# Patient Record
Sex: Male | Born: 1985 | Race: White | Hispanic: No | Marital: Single | Smoking: Current every day smoker
Health system: Southern US, Community
[De-identification: ages and names within clinical notes are randomized; demographics above are authoritative.]

## PROBLEM LIST (undated history)

## (undated) DIAGNOSIS — K5792 Diverticulitis of intestine, part unspecified, without perforation or abscess without bleeding: Secondary | ICD-10-CM

## (undated) DIAGNOSIS — I10 Essential (primary) hypertension: Secondary | ICD-10-CM

## (undated) DIAGNOSIS — F1010 Alcohol abuse, uncomplicated: Secondary | ICD-10-CM

## (undated) DIAGNOSIS — F1910 Other psychoactive substance abuse, uncomplicated: Secondary | ICD-10-CM

## (undated) HISTORY — PX: TIBIA FRACTURE SURGERY: SHX806

## (undated) HISTORY — PX: TONSILLECTOMY AND ADENOIDECTOMY: SUR1326

## (undated) HISTORY — PX: LITHOTRIPSY: SUR834

## (undated) HISTORY — PX: CHEST TUBE INSERTION: SHX231

## (undated) HISTORY — PX: ANKLE SURGERY: SHX546

---
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
## 2012-11-26 ENCOUNTER — Emergency Department: Payer: Self-pay | Admitting: Emergency Medicine

## 2012-12-09 ENCOUNTER — Emergency Department: Payer: Self-pay | Admitting: Emergency Medicine

## 2013-05-07 ENCOUNTER — Emergency Department: Payer: Self-pay | Admitting: Emergency Medicine

## 2013-07-18 ENCOUNTER — Emergency Department: Payer: Self-pay | Admitting: Internal Medicine

## 2013-11-10 ENCOUNTER — Emergency Department: Payer: Self-pay | Admitting: Emergency Medicine

## 2013-11-22 ENCOUNTER — Emergency Department: Payer: Self-pay | Admitting: Emergency Medicine

## 2013-11-22 LAB — CBC WITH DIFFERENTIAL/PLATELET
BASOS ABS: 0.1 10*3/uL (ref 0.0–0.1)
BASOS PCT: 0.7 %
Eosinophil #: 0.1 10*3/uL (ref 0.0–0.7)
Eosinophil %: 1.2 %
HCT: 43.7 % (ref 40.0–52.0)
HGB: 14.6 g/dL (ref 13.0–18.0)
LYMPHS ABS: 2.2 10*3/uL (ref 1.0–3.6)
Lymphocyte %: 18.1 %
MCH: 32.1 pg (ref 26.0–34.0)
MCHC: 33.4 g/dL (ref 32.0–36.0)
MCV: 96 fL (ref 80–100)
Monocyte #: 0.8 x10 3/mm (ref 0.2–1.0)
Monocyte %: 6.4 %
NEUTROS ABS: 9.1 10*3/uL — AB (ref 1.4–6.5)
NEUTROS PCT: 73.6 %
PLATELETS: 181 10*3/uL (ref 150–440)
RBC: 4.56 10*6/uL (ref 4.40–5.90)
RDW: 13.4 % (ref 11.5–14.5)
WBC: 12.4 10*3/uL — ABNORMAL HIGH (ref 3.8–10.6)

## 2013-11-22 LAB — COMPREHENSIVE METABOLIC PANEL
ALBUMIN: 3.9 g/dL (ref 3.4–5.0)
ALT: 30 U/L (ref 12–78)
ANION GAP: 5 — AB (ref 7–16)
AST: 24 U/L (ref 15–37)
Alkaline Phosphatase: 81 U/L
BUN: 9 mg/dL (ref 7–18)
Bilirubin,Total: 0.4 mg/dL (ref 0.2–1.0)
CHLORIDE: 105 mmol/L (ref 98–107)
Calcium, Total: 9.2 mg/dL (ref 8.5–10.1)
Co2: 28 mmol/L (ref 21–32)
Creatinine: 0.8 mg/dL (ref 0.60–1.30)
EGFR (African American): >60
Glucose: 105 mg/dL — ABNORMAL HIGH (ref 65–99)
Osmolality: 275 (ref 275–301)
POTASSIUM: 4.1 mmol/L (ref 3.5–5.1)
SODIUM: 138 mmol/L (ref 136–145)
Total Protein: 7.5 g/dL (ref 6.4–8.2)

## 2013-11-22 LAB — URINALYSIS, COMPLETE
BLOOD: NEGATIVE
Bacteria: NONE SEEN
Bilirubin,UR: NEGATIVE
GLUCOSE, UR: NEGATIVE mg/dL (ref 0–75)
KETONE: NEGATIVE
Leukocyte Esterase: NEGATIVE
NITRITE: NEGATIVE
PROTEIN: NEGATIVE
Ph: 7 (ref 4.5–8.0)
RBC,UR: 8 /HPF (ref 0–5)
SQUAMOUS EPITHELIAL: NONE SEEN
Specific Gravity: 1.02 (ref 1.003–1.030)

## 2014-06-09 ENCOUNTER — Emergency Department: Payer: Self-pay | Admitting: Emergency Medicine

## 2014-06-09 LAB — URINALYSIS, COMPLETE
BILIRUBIN, UR: NEGATIVE
Bacteria: NONE SEEN
Glucose,UR: NEGATIVE mg/dL (ref 0–75)
Leukocyte Esterase: NEGATIVE
NITRITE: NEGATIVE
PH: 6 (ref 4.5–8.0)
Protein: 100
RBC,UR: 6506 /HPF (ref 0–5)
SQUAMOUS EPITHELIAL: NONE SEEN
Specific Gravity: 1.033 (ref 1.003–1.030)
WBC UR: 54 /HPF (ref 0–5)

## 2014-06-09 LAB — COMPREHENSIVE METABOLIC PANEL
Albumin: 3.9 g/dL (ref 3.4–5.0)
Alkaline Phosphatase: 86 U/L
Anion Gap: 4 — ABNORMAL LOW (ref 7–16)
BILIRUBIN TOTAL: 0.5 mg/dL (ref 0.2–1.0)
BUN: 15 mg/dL (ref 7–18)
CHLORIDE: 106 mmol/L (ref 98–107)
Calcium, Total: 8.7 mg/dL (ref 8.5–10.1)
Co2: 29 mmol/L (ref 21–32)
Creatinine: 1.02 mg/dL (ref 0.60–1.30)
EGFR (African American): >60
EGFR (Non-African Amer.): >60
Glucose: 104 mg/dL — ABNORMAL HIGH (ref 65–99)
OSMOLALITY: 279 (ref 275–301)
POTASSIUM: 3.8 mmol/L (ref 3.5–5.1)
SGOT(AST): 24 U/L (ref 15–37)
SGPT (ALT): 22 U/L
Sodium: 139 mmol/L (ref 136–145)
TOTAL PROTEIN: 7.6 g/dL (ref 6.4–8.2)

## 2014-06-09 LAB — CBC WITH DIFFERENTIAL/PLATELET
BASOS ABS: 0.1 10*3/uL (ref 0.0–0.1)
BASOS PCT: 0.8 %
Eosinophil #: 0.4 10*3/uL (ref 0.0–0.7)
Eosinophil %: 3.9 %
HCT: 45 % (ref 40.0–52.0)
HGB: 15.1 g/dL (ref 13.0–18.0)
LYMPHS PCT: 25.2 %
Lymphocyte #: 2.6 10*3/uL (ref 1.0–3.6)
MCH: 33.4 pg (ref 26.0–34.0)
MCHC: 33.6 g/dL (ref 32.0–36.0)
MCV: 99 fL (ref 80–100)
Monocyte #: 0.7 x10 3/mm (ref 0.2–1.0)
Monocyte %: 6.8 %
Neutrophil #: 6.6 10*3/uL — ABNORMAL HIGH (ref 1.4–6.5)
Neutrophil %: 63.3 %
PLATELETS: 203 10*3/uL (ref 150–440)
RBC: 4.53 10*6/uL (ref 4.40–5.90)
RDW: 13.3 % (ref 11.5–14.5)
WBC: 10.5 10*3/uL (ref 3.8–10.6)

## 2014-07-03 ENCOUNTER — Emergency Department: Payer: Self-pay | Admitting: Emergency Medicine

## 2014-07-03 LAB — CBC
HCT: 50.7 % (ref 40.0–52.0)
HGB: 16.8 g/dL (ref 13.0–18.0)
MCH: 33.1 pg (ref 26.0–34.0)
MCHC: 33.2 g/dL (ref 32.0–36.0)
MCV: 100 fL (ref 80–100)
PLATELETS: 170 10*3/uL (ref 150–440)
RBC: 5.08 10*6/uL (ref 4.40–5.90)
RDW: 13.5 % (ref 11.5–14.5)
WBC: 12.6 10*3/uL — AB (ref 3.8–10.6)

## 2014-07-03 LAB — COMPREHENSIVE METABOLIC PANEL
ALBUMIN: 4.2 g/dL (ref 3.4–5.0)
Alkaline Phosphatase: 94 U/L
Anion Gap: 7 (ref 7–16)
BUN: 12 mg/dL (ref 7–18)
Bilirubin,Total: 0.5 mg/dL (ref 0.2–1.0)
CALCIUM: 9.4 mg/dL (ref 8.5–10.1)
CREATININE: 1.29 mg/dL (ref 0.60–1.30)
Chloride: 107 mmol/L (ref 98–107)
Co2: 26 mmol/L (ref 21–32)
EGFR (Non-African Amer.): >60
Glucose: 92 mg/dL (ref 65–99)
OSMOLALITY: 279 (ref 275–301)
POTASSIUM: 3.7 mmol/L (ref 3.5–5.1)
SGOT(AST): 27 U/L (ref 15–37)
SGPT (ALT): 20 U/L
Sodium: 140 mmol/L (ref 136–145)
TOTAL PROTEIN: 8.1 g/dL (ref 6.4–8.2)

## 2014-07-04 ENCOUNTER — Ambulatory Visit: Payer: Self-pay | Admitting: Urology

## 2014-07-09 ENCOUNTER — Emergency Department: Payer: Self-pay | Admitting: Emergency Medicine

## 2014-07-09 LAB — COMPREHENSIVE METABOLIC PANEL
ALBUMIN: 3.6 g/dL (ref 3.4–5.0)
AST: 23 U/L (ref 15–37)
Alkaline Phosphatase: 78 U/L
Anion Gap: 9 (ref 7–16)
BUN: 15 mg/dL (ref 7–18)
Bilirubin,Total: 0.2 mg/dL (ref 0.2–1.0)
CALCIUM: 8.8 mg/dL (ref 8.5–10.1)
Chloride: 108 mmol/L — ABNORMAL HIGH (ref 98–107)
Co2: 25 mmol/L (ref 21–32)
Creatinine: 1.16 mg/dL (ref 0.60–1.30)
EGFR (African American): >60
EGFR (Non-African Amer.): >60
GLUCOSE: 109 mg/dL — AB (ref 65–99)
Osmolality: 285 (ref 275–301)
POTASSIUM: 4.1 mmol/L (ref 3.5–5.1)
SGPT (ALT): 21 U/L
Sodium: 142 mmol/L (ref 136–145)
Total Protein: 7.1 g/dL (ref 6.4–8.2)

## 2014-07-09 LAB — URINALYSIS, COMPLETE
BILIRUBIN, UR: NEGATIVE
Bacteria: NONE SEEN
GLUCOSE, UR: NEGATIVE mg/dL (ref 0–75)
Ketone: NEGATIVE
LEUKOCYTE ESTERASE: NEGATIVE
Nitrite: NEGATIVE
PH: 6 (ref 4.5–8.0)
PROTEIN: NEGATIVE
RBC,UR: 53 /HPF (ref 0–5)
Specific Gravity: 1.016 (ref 1.003–1.030)
WBC UR: 1 /HPF (ref 0–5)

## 2014-07-09 LAB — CBC
HCT: 45.9 % (ref 40.0–52.0)
HGB: 15.2 g/dL (ref 13.0–18.0)
MCH: 33 pg (ref 26.0–34.0)
MCHC: 33.1 g/dL (ref 32.0–36.0)
MCV: 100 fL (ref 80–100)
Platelet: 185 10*3/uL (ref 150–440)
RBC: 4.61 10*6/uL (ref 4.40–5.90)
RDW: 13.4 % (ref 11.5–14.5)
WBC: 15.3 10*3/uL — ABNORMAL HIGH (ref 3.8–10.6)

## 2014-07-11 ENCOUNTER — Ambulatory Visit: Payer: Self-pay | Admitting: Urology

## 2014-07-11 LAB — DRUG SCREEN, URINE
AMPHETAMINES, UR SCREEN: NEGATIVE (ref ?–1000)
Barbiturates, Ur Screen: NEGATIVE (ref ?–200)
Benzodiazepine, Ur Scrn: NEGATIVE (ref ?–200)
Cannabinoid 50 Ng, Ur ~~LOC~~: POSITIVE (ref ?–50)
Cocaine Metabolite,Ur ~~LOC~~: NEGATIVE (ref ?–300)
MDMA (Ecstasy)Ur Screen: NEGATIVE (ref ?–500)
METHADONE, UR SCREEN: NEGATIVE (ref ?–300)
OPIATE, UR SCREEN: NEGATIVE (ref ?–300)
PHENCYCLIDINE (PCP) UR S: NEGATIVE (ref ?–25)
Tricyclic, Ur Screen: NEGATIVE (ref ?–1000)

## 2014-07-11 LAB — URINALYSIS, COMPLETE
BACTERIA: NONE SEEN
BLOOD: NEGATIVE
Bilirubin,UR: NEGATIVE
Glucose,UR: NEGATIVE mg/dL (ref 0–75)
Ketone: NEGATIVE
LEUKOCYTE ESTERASE: NEGATIVE
NITRITE: NEGATIVE
PH: 5 (ref 4.5–8.0)
PROTEIN: NEGATIVE
Specific Gravity: 1.021 (ref 1.003–1.030)
Squamous Epithelial: <1

## 2014-11-05 NOTE — Op Note (Signed)
PATIENT NAME:  Christian Dickerson, Christian Dickerson MR#:  [id number] DATE OF BIRTH:  [birth year]  DATE OF PROCEDURE:  07/11/2014  PREOPERATIVE DIAGNOSIS: Left ureterolithiasis.   POSTOPERATIVE DIAGNOSIS: Left ureterolithiasis.   PROCEDURES PERFORMED:  1. Left ureteroscopic ureterolithotomy with holmium laser lithotripsy.  2. Fluoroscopy.   SURGEON: Suszanne ConnersMichael R. Evelene CroonWolff, MD  ANESTHETISLurlean Nanny: Gijsbertus F. Darleene CleaverVan Staveren, MD and  Suszanne ConnersMichael R. Evelene CroonWolff, MD  ANESTHETIC METHOD: General per Dr. Darleene CleaverVan Staveren and local per Dr. Evelene CroonWolff.   INDICATIONS: See the dictated history and physical. After informed consent, the patient requests the above procedure.   OPERATIVE SUMMARY: After adequate general anesthesia had been obtained, the patient was placed into dorsal lithotomy position, and the perineum was prepped and draped in the usual fashion. Fluoroscopy was performed. The patient had a distal left ureteral stone, as well as a left pelvic phlebolith present. At this point, the Stortz mini ureteroscope was coupled with the camera and advanced into the bladder. No bladder lesions were identified. An attempt was made to pass the ureteroscope into the distal ureter, but due to narrowing of the intramural ureter, this was not possible. Therefore, a 0.025 Glidewire was advanced through the ureteroscope and passed up the ureter past the stone into the left renal pelvis. The ureteroscope was removed, taking care to leave the guidewire in position. The intramural ureter was then dilated to 7 mm with a balloon-dilating catheter. After adequate dilatation had been performed, the balloon was deflated and the dilating catheter was removed, taking care to leave the guidewire in position. The mini Storz ureteroscope was then advanced back into the distal ureter and up to the level of the stone. The 272 micron holmium laser fiber was advanced through the scope, and the stone was engaged and fully disintegrated into powder by the laser. At this point, the  ureteroscope was removed. The guidewire was also removed, 10 mL of Xylocaine Viscous was instilled within the urethra and bladder. A B and O suppository was placed. The procedure was then terminated, and the patient was transferred to the recovery room in stable condition.    ____________________________ Suszanne ConnersMichael R. Evelene CroonWolff, MD [email or web address] Dickerson: 07/11/2014 15:16:21 ET T: 07/11/2014 17:26:06 ET JOB#: [id number]  cc: Suszanne ConnersMichael R. Evelene CroonWolff, MD, <Dictator> Orson ApeMICHAEL R Wava Kildow MD ELECTRONICALLY SIGNED 07/12/2014 9:07

## 2014-12-26 ENCOUNTER — Emergency Department
Admission: EM | Admit: 2014-12-26 | Discharge: 2014-12-26 | Disposition: A | Discharge status: Home / Self Care | Payer: Self-pay | Attending: Emergency Medicine | Admitting: Emergency Medicine

## 2014-12-26 ENCOUNTER — Emergency Department: Payer: Self-pay

## 2014-12-26 ENCOUNTER — Encounter: Payer: Self-pay | Admitting: Emergency Medicine

## 2014-12-26 DIAGNOSIS — R109 Unspecified abdominal pain: Secondary | ICD-10-CM

## 2014-12-26 DIAGNOSIS — N200 Calculus of kidney: Secondary | ICD-10-CM | POA: Insufficient documentation

## 2014-12-26 DIAGNOSIS — R319 Hematuria, unspecified: Secondary | ICD-10-CM | POA: Insufficient documentation

## 2014-12-26 DIAGNOSIS — Z720 Tobacco use: Secondary | ICD-10-CM | POA: Insufficient documentation

## 2014-12-26 LAB — COMPREHENSIVE METABOLIC PANEL
ALT: 17 U/L (ref 17–63)
AST: 19 U/L (ref 15–41)
Albumin: 4.2 g/dL (ref 3.5–5.0)
Alkaline Phosphatase: 71 U/L (ref 38–126)
Anion gap: 8 (ref 5–15)
BUN: 14 mg/dL (ref 6–20)
CO2: 25 mmol/L (ref 22–32)
CREATININE: 0.81 mg/dL (ref 0.61–1.24)
Calcium: 9.4 mg/dL (ref 8.9–10.3)
Chloride: 110 mmol/L (ref 101–111)
GFR calc non Af Amer: >60 mL/min (ref >60)
Glucose, Bld: 95 mg/dL (ref 65–99)
Potassium: 3.8 mmol/L (ref 3.5–5.1)
Sodium: 143 mmol/L (ref 135–145)
Total Bilirubin: 0.1 mg/dL — ABNORMAL LOW (ref 0.3–1.2)
Total Protein: 7 g/dL (ref 6.5–8.1)

## 2014-12-26 LAB — CBC
HCT: 43.4 % (ref 40.0–52.0)
HEMOGLOBIN: 14.6 g/dL (ref 13.0–18.0)
MCH: 33.6 pg (ref 26.0–34.0)
MCHC: 33.7 g/dL (ref 32.0–36.0)
MCV: 99.7 fL (ref 80.0–100.0)
Platelets: 150 10*3/uL (ref 150–440)
RBC: 4.35 MIL/uL — ABNORMAL LOW (ref 4.40–5.90)
RDW: 13.4 % (ref 11.5–14.5)
WBC: 9.1 10*3/uL (ref 3.8–10.6)

## 2014-12-26 LAB — URINALYSIS COMPLETE WITH MICROSCOPIC (ARMC ONLY)
BACTERIA UA: NONE SEEN
Bilirubin Urine: NEGATIVE
Glucose, UA: NEGATIVE mg/dL
Ketones, ur: NEGATIVE mg/dL
LEUKOCYTES UA: NEGATIVE
Nitrite: NEGATIVE
Protein, ur: NEGATIVE mg/dL
SPECIFIC GRAVITY, URINE: 1.019 (ref 1.005–1.030)
Squamous Epithelial / LPF: NONE SEEN
pH: 7 (ref 5.0–8.0)

## 2014-12-26 MED ORDER — KETOROLAC TROMETHAMINE 30 MG/ML IJ SOLN
INTRAMUSCULAR | Status: AC
  Filled 2014-12-26: qty 1

## 2014-12-26 MED ORDER — KETOROLAC TROMETHAMINE 30 MG/ML IJ SOLN: 30.0000 mg | Freq: Once | INTRAMUSCULAR | Status: AC

## 2014-12-26 MED ORDER — MORPHINE SULFATE 4 MG/ML IJ SOLN
INTRAMUSCULAR | Status: AC
  Filled 2014-12-26: qty 1

## 2014-12-26 MED ORDER — TAMSULOSIN HCL 0.4 MG PO CAPS: 0.4000 mg | ORAL_CAPSULE | Freq: Every day | ORAL | Status: DC

## 2014-12-26 MED ORDER — ONDANSETRON HCL 4 MG/2ML IJ SOLN
INTRAMUSCULAR | Status: AC
  Filled 2014-12-26: qty 2

## 2014-12-26 MED ORDER — ONDANSETRON 4 MG PO TBDP: 4.0000 mg | ORAL_TABLET | Freq: Three times a day (TID) | ORAL | Status: DC | PRN

## 2014-12-26 MED ORDER — OXYCODONE HCL 5 MG PO TABS: 5.0000 mg | ORAL_TABLET | Freq: Four times a day (QID) | ORAL | Status: DC | PRN

## 2014-12-26 MED ORDER — MORPHINE SULFATE 4 MG/ML IJ SOLN
4.0000 mg | Freq: Once | INTRAMUSCULAR | Status: AC
  Administered 2014-12-26: 4 mg via INTRAVENOUS

## 2014-12-26 MED ORDER — ONDANSETRON HCL 4 MG/2ML IJ SOLN
4.0000 mg | Freq: Once | INTRAMUSCULAR | Status: AC
  Administered 2014-12-26: 4 mg via INTRAVENOUS

## 2014-12-26 NOTE — ED Notes (Signed)
Pt reports scrotal pain that began acutely this morning approx 0500 after urinating, pt admits to having experienced left flank pain intermittently x1-2weeks. Pt w/ hx of kidney stones and reports similar symptoms in the past.

## 2014-12-26 NOTE — ED Notes (Signed)
Patient transported to CT 

## 2014-12-26 NOTE — ED Provider Notes (Signed)
-----------------------------------------   8:17 AM on 12/26/2014 -----------------------------------------  Assuming care from Dr. Manson Passey at 0700.  In short, Christian Dickerson is a 29 y.o. male with a chief complaint of Flank Pain .  Refer to the original H&P for additional details.  The current plan of care is to follow-up labs, urinalysis and CT of the abdomen and pelvis given concern for possible renal stone/ureterolithiasis. Patient currently sleeping. Pain well controlled.  ----------------------------------------- 8:49 AM on 12/26/2014 -----------------------------------------  Patient reports his current pain is currently well controlled. He is awake, resting comfortably in bed. Labs generally unremarkable. CT shows chronic mild to moderate left hydronephrosis without obvious calculus or obstructing lesion today however there is a question of a recently passed stone on the left which is the most likely cause of the patient's symptoms which have now improved. UA with multiple RBCs as expected, no evidence for infected stone. Discussed symptomatic care, follow up with urology. Discussed return precautions and patient is comfortable with discharge plan.   Gayla Doss, MD 12/26/14 [phone]

## 2014-12-26 NOTE — ED Notes (Signed)
Patient ambulatory to triage with steady gait, without difficulty or distress noted; pt reports awoke with testicular pain, hematuria and lower back pain; hx kidney stones

## 2014-12-26 NOTE — ED Provider Notes (Signed)
[facility] Emergency Department Provider Note  ____________________________________________  Time seen: 6:30 AM  I have reviewed the triage vital signs and the nursing notes.   HISTORY  Chief Complaint Flank Pain      HPI Christian Dickerson is a 28 y.o. male presents with 10 out of 10 left flank pain radiating to the testicles, positive hematuria. Patient denies any fever, no dysuria    History reviewed. No pertinent past medical history.  There are no active problems to display for this patient.   Past Surgical History  Procedure Laterality Date  . Lithotripsy    . Tonsillectomy and adenoidectomy      No current outpatient prescriptions on file.  Allergies Review of patient's allergies indicates no known allergies.  No family history on file.  Social History History  Substance Use Topics  . Smoking status: Current Every Day Smoker -- 1.00 packs/day    Types: Cigarettes  . Smokeless tobacco: Not on file  . Alcohol Use: No    Review of Systems  Constitutional: Negative for fever. Eyes: Negative for visual changes. ENT: Negative for sore throat. Cardiovascular: Negative for chest pain. Respiratory: Negative for shortness of breath. Gastrointestinal: Positive for flank pain,  Genitourinary: Negative for dysuria. Musculoskeletal: Negative for back pain. Skin: Negative for rash. Neurological: Negative for headaches, focal weakness or numbness.   10-point ROS otherwise negative.  ____________________________________________   PHYSICAL EXAM:  VITAL SIGNS: ED Triage Vitals  Enc Vitals Group     BP 12/26/14 0617 129/86 mmHg     Pulse Rate 12/26/14 0617 86     Resp 12/26/14 0617 18     Temp 12/26/14 0617 98 F (36.7 C)     Temp Source 12/26/14 0617 Oral     SpO2 12/26/14 0617 100 %     Weight 12/26/14 0617 225 lb (102.059 kg)     Height 12/26/14 0617  (1.854 m)     Head Cir --      Peak Flow --      Pain Score  12/26/14 0627 9     Pain Loc --      Pain Edu? --      Excl. in GC? --     Constitutional: Alert and oriented. Apparent distress Eyes: Conjunctivae are normal. PERRL. Normal extraocular movements. ENT   Head: Normocephalic and atraumatic.   Nose: No congestion/rhinnorhea.   Mouth/Throat: Mucous membranes are moist.   Neck: No stridor. Cardiovascular: Normal rate, regular rhythm. Normal and symmetric distal pulses are present in all extremities. No murmurs, rubs, or gallops. Respiratory: Normal respiratory effort without tachypnea nor retractions. Breath sounds are clear and equal bilaterally. No wheezes/rales/rhonchi. Gastrointestinal: Soft and nontender. No distention. There is no CVA tenderness. Genitourinary: deferred Musculoskeletal: Nontender with normal range of motion in all extremities. No joint effusions.  No lower extremity tenderness nor edema. Neurologic:  Normal speech and language. No gross focal neurologic deficits are appreciated. Speech is normal.  Skin:  Skin is warm, dry and intact. No rash noted. Psychiatric: Mood and affect are normal. Speech and behavior are normal. Patient exhibits appropriate insight and judgment.  ____________________________________________    LABS (pertinent positives/negatives)  Labs Reviewed  CBC - Abnormal; Notable for the following:    RBC 4.35 (*)    All other components within normal limits  COMPREHENSIVE METABOLIC PANEL - Abnormal; Notable for the following:    Total Bilirubin 0.1 (*)    All other components within normal limits  URINALYSIS  COMPLETEWITH MICROSCOPIC ([facility] ONLY) - Abnormal; Notable for the following:    Color, Urine YELLOW (*)    APPearance CLEAR (*)    Hgb urine dipstick 3+ (*)    All other components within normal limits     ____________________________________________   EKG    ____________________________________________    RADIOLOGY  CT scan of the abdomen and pelvis  revealed: IMPRESSION: 1. Mild to moderate left hydronephrosis, similar to that in December, but no urologic calculus or obstructing lesion identified today. Query recently passed stone versus left UPJ scarring. 2. Otherwise negative noncontrast CT Abdomen and Pelvis.  INITIAL IMPRESSION / ASSESSMENT AND PLAN / ED COURSE  Pertinent labs & imaging results that were available during my care of the patient were reviewed by me and considered in my medical decision making (see chart for details).    ____________________________________________   FINAL CLINICAL IMPRESSION(S) / ED DIAGNOSES  Final diagnoses:  Kidney stone      Darci Current, MD 12/28/14 2315

## 2015-04-07 ENCOUNTER — Emergency Department
Admission: EM | Admit: 2015-04-07 | Discharge: 2015-04-07 | Disposition: A | Discharge status: Home / Self Care | Payer: No Typology Code available for payment source | Attending: Emergency Medicine | Admitting: Emergency Medicine

## 2015-04-07 DIAGNOSIS — S299XXA Unspecified injury of thorax, initial encounter: Secondary | ICD-10-CM | POA: Diagnosis not present

## 2015-04-07 DIAGNOSIS — Y9389 Activity, other specified: Secondary | ICD-10-CM | POA: Insufficient documentation

## 2015-04-07 DIAGNOSIS — S8291XA Unspecified fracture of right lower leg, initial encounter for closed fracture: Secondary | ICD-10-CM | POA: Diagnosis not present

## 2015-04-07 DIAGNOSIS — Z79899 Other long term (current) drug therapy: Secondary | ICD-10-CM | POA: Insufficient documentation

## 2015-04-07 DIAGNOSIS — S99911A Unspecified injury of right ankle, initial encounter: Secondary | ICD-10-CM | POA: Insufficient documentation

## 2015-04-07 DIAGNOSIS — Y998 Other external cause status: Secondary | ICD-10-CM | POA: Diagnosis not present

## 2015-04-07 DIAGNOSIS — Y92410 Unspecified street and highway as the place of occurrence of the external cause: Secondary | ICD-10-CM | POA: Insufficient documentation

## 2015-04-07 DIAGNOSIS — Z720 Tobacco use: Secondary | ICD-10-CM | POA: Diagnosis not present

## 2015-04-07 DIAGNOSIS — S8991XA Unspecified injury of right lower leg, initial encounter: Secondary | ICD-10-CM | POA: Diagnosis present

## 2015-04-07 DIAGNOSIS — M79604 Pain in right leg: Secondary | ICD-10-CM

## 2015-04-07 MED ORDER — OXYCODONE HCL 5 MG PO TABS
5.0000 mg | ORAL_TABLET | Freq: Once | ORAL | Status: AC
  Administered 2015-04-07: 5 mg via ORAL
  Filled 2015-04-07: qty 1

## 2015-04-07 MED ORDER — OXYCODONE HCL 5 MG PO CAPS: 5.0000 mg | ORAL_CAPSULE | ORAL | Status: DC | PRN
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
## 2015-04-07 NOTE — Discharge Instructions (Signed)
Contact her orthopedist for any continued pain medication. Keep her appointment for your surgery on Thursday. Keep extremity elevated to prevent swelling. Take pain medication only as directed.
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
## 2015-05-16 ENCOUNTER — Encounter: Payer: Self-pay | Admitting: *Deleted

## 2015-05-16 ENCOUNTER — Emergency Department: Payer: Self-pay

## 2015-05-16 ENCOUNTER — Emergency Department
Admission: EM | Admit: 2015-05-16 | Discharge: 2015-05-17 | Disposition: A | Discharge status: Home / Self Care | Payer: Self-pay | Attending: Emergency Medicine | Admitting: Emergency Medicine

## 2015-05-16 DIAGNOSIS — R52 Pain, unspecified: Secondary | ICD-10-CM

## 2015-05-16 DIAGNOSIS — Z79899 Other long term (current) drug therapy: Secondary | ICD-10-CM | POA: Insufficient documentation

## 2015-05-16 DIAGNOSIS — N50812 Left testicular pain: Secondary | ICD-10-CM | POA: Insufficient documentation

## 2015-05-16 DIAGNOSIS — Z720 Tobacco use: Secondary | ICD-10-CM | POA: Insufficient documentation

## 2015-05-16 DIAGNOSIS — R109 Unspecified abdominal pain: Secondary | ICD-10-CM

## 2015-05-16 LAB — CBC WITH DIFFERENTIAL/PLATELET
Basophils Absolute: 0.2 10*3/uL — ABNORMAL HIGH (ref 0–0.1)
Basophils Relative: 2 %
EOS PCT: 5 %
Eosinophils Absolute: 0.4 10*3/uL (ref 0–0.7)
HCT: 41.6 % (ref 40.0–52.0)
Hemoglobin: 13.8 g/dL (ref 13.0–18.0)
LYMPHS ABS: 2.6 10*3/uL (ref 1.0–3.6)
Lymphocytes Relative: 33 %
MCH: 31.3 pg (ref 26.0–34.0)
MCHC: 33.1 g/dL (ref 32.0–36.0)
MCV: 94.4 fL (ref 80.0–100.0)
MONO ABS: 0.8 10*3/uL (ref 0.2–1.0)
Monocytes Relative: 10 %
Neutro Abs: 4.1 10*3/uL (ref 1.4–6.5)
Neutrophils Relative %: 50 %
PLATELETS: 197 10*3/uL (ref 150–440)
RBC: 4.4 MIL/uL (ref 4.40–5.90)
RDW: 15.6 % — AB (ref 11.5–14.5)
WBC: 8 10*3/uL (ref 3.8–10.6)

## 2015-05-16 LAB — BASIC METABOLIC PANEL
ANION GAP: 5 (ref 5–15)
BUN: 15 mg/dL (ref 6–20)
CALCIUM: 9 mg/dL (ref 8.9–10.3)
CO2: 23 mmol/L (ref 22–32)
Chloride: 110 mmol/L (ref 101–111)
Creatinine, Ser: 0.92 mg/dL (ref 0.61–1.24)
Glucose, Bld: 98 mg/dL (ref 65–99)
Potassium: 3.7 mmol/L (ref 3.5–5.1)
Sodium: 138 mmol/L (ref 135–145)

## 2015-05-16 LAB — URINALYSIS COMPLETE WITH MICROSCOPIC (ARMC ONLY)
Bilirubin Urine: NEGATIVE
GLUCOSE, UA: NEGATIVE mg/dL
Hgb urine dipstick: NEGATIVE
LEUKOCYTES UA: NEGATIVE
NITRITE: NEGATIVE
Protein, ur: NEGATIVE mg/dL
SPECIFIC GRAVITY, URINE: 1.028 (ref 1.005–1.030)
pH: 6 (ref 5.0–8.0)

## 2015-05-16 MED ORDER — MORPHINE SULFATE (PF) 4 MG/ML IV SOLN
6.0000 mg | Freq: Once | INTRAVENOUS | Status: AC
  Administered 2015-05-16: 6 mg via INTRAVENOUS
  Filled 2015-05-16: qty 2

## 2015-05-16 MED ORDER — MORPHINE SULFATE (PF) 4 MG/ML IV SOLN
4.0000 mg | Freq: Once | INTRAVENOUS | Status: AC
  Administered 2015-05-16: 4 mg via INTRAVENOUS
  Filled 2015-05-16: qty 1

## 2015-05-16 MED ORDER — ONDANSETRON HCL 4 MG/2ML IJ SOLN
4.0000 mg | Freq: Once | INTRAMUSCULAR | Status: AC
  Administered 2015-05-16: 4 mg via INTRAVENOUS
  Filled 2015-05-16: qty 2

## 2015-05-16 MED ORDER — DEXTROSE 5 % IV SOLN
1.0000 g | Freq: Once | INTRAVENOUS | Status: AC
  Administered 2015-05-17: 1 g via INTRAVENOUS
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
## 2015-05-16 NOTE — Discharge Instructions (Signed)
Abdominal Pain, Adult Many things can cause abdominal pain. Usually, abdominal pain is not caused by a disease and will improve without treatment. It can often be observed and treated at home. Your health care provider will do a physical exam and possibly order blood tests and X-rays to help determine the seriousness of your pain. However, in many cases, more time must pass before a clear cause of the pain can be found. Before that point, your health care provider may not know if you need more testing or further treatment. HOME CARE INSTRUCTIONS Monitor your abdominal pain for any changes. The following actions may help to alleviate any discomfort you are experiencing:  Only take over-the-counter or prescription medicines as directed by your health care provider.  Do not take laxatives unless directed to do so by your health care provider.  Try a clear liquid diet (broth, tea, or water) as directed by your health care provider. Slowly move to a bland diet as tolerated. SEEK MEDICAL CARE IF:  You have unexplained abdominal pain.  You have abdominal pain associated with nausea or diarrhea.  You have pain when you urinate or have a bowel movement.  You experience abdominal pain that wakes you in the night.  You have abdominal pain that is worsened or improved by eating food.  You have abdominal pain that is worsened with eating fatty foods.  You have a fever. SEEK IMMEDIATE MEDICAL CARE IF:  Your pain does not go away within 2 hours.  You keep throwing up (vomiting).  Your pain is felt only in portions of the abdomen, such as the right side or the left lower portion of the abdomen.  You pass bloody or black tarry stools. MAKE SURE YOU:  Understand these instructions.  Will watch your condition.  Will get help right away if you are not doing well or get worse.   This information is not intended to replace advice given to you by your health care provider. Make sure you discuss  any questions you have with your health care provider.   Document Released: 04/02/2005 Document Revised: 03/14/2015 Document Reviewed: 03/02/2013 Elsevier Interactive Patient Education 2016 [employer]  There does not seem to be any kidney stone on the CAT scan. Also the urine is clear and the blood work looks okay. Just in case I will give you some doxycycline one twice a day for 10 days. Please follow-up with urologist Dr. Thea SilversmithMackenzie. Please also return for fever vomiting worse pain or any other problems.

## 2015-05-16 NOTE — ED Notes (Signed)
Patient transported to Ultrasound 

## 2015-05-16 NOTE — ED Notes (Signed)
Patient transported to CT 
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
## 2015-05-17 MED ORDER — DEXTROSE 5 % IV SOLN
INTRAVENOUS | Status: AC
  Administered 2015-05-17: 1 g via INTRAVENOUS
  Filled 2015-05-17: qty 10

## 2015-05-17 MED ORDER — OXYCODONE-ACETAMINOPHEN 5-325 MG PO TABS
2.0000 | ORAL_TABLET | Freq: Once | ORAL | Status: AC
  Administered 2015-05-17: 2 via ORAL
  Filled 2015-05-17: qty 2

## 2015-05-18 LAB — CHLAMYDIA/NGC RT PCR (ARMC ONLY)
Chlamydia Tr: NOT DETECTED
N GONORRHOEAE: NOT DETECTED

## 2015-10-03 ENCOUNTER — Emergency Department
Admission: EM | Admit: 2015-10-03 | Discharge: 2015-10-03 | Disposition: A | Discharge status: Home / Self Care | Payer: No Typology Code available for payment source | Attending: Emergency Medicine | Admitting: Emergency Medicine

## 2015-10-03 DIAGNOSIS — Y999 Unspecified external cause status: Secondary | ICD-10-CM | POA: Insufficient documentation

## 2015-10-03 DIAGNOSIS — W57XXXA Bitten or stung by nonvenomous insect and other nonvenomous arthropods, initial encounter: Secondary | ICD-10-CM | POA: Insufficient documentation

## 2015-10-03 DIAGNOSIS — Y929 Unspecified place or not applicable: Secondary | ICD-10-CM | POA: Insufficient documentation

## 2015-10-03 DIAGNOSIS — L03116 Cellulitis of left lower limb: Secondary | ICD-10-CM

## 2015-10-03 DIAGNOSIS — Y939 Activity, unspecified: Secondary | ICD-10-CM | POA: Insufficient documentation

## 2015-10-03 DIAGNOSIS — F17210 Nicotine dependence, cigarettes, uncomplicated: Secondary | ICD-10-CM | POA: Insufficient documentation

## 2015-10-03 MED ORDER — DOXYCYCLINE HYCLATE 100 MG PO CAPS: 100.0000 mg | ORAL_CAPSULE | Freq: Two times a day (BID) | ORAL | Status: DC

## 2015-10-03 NOTE — ED Notes (Signed)
Pt presents to ED after a tick bite on Sunday that he states he did not realize until Monday night. Pt states his back hurts, "he feels shitty". Tick bite to L shin, redness note. Pt states drainage from bite, states it is yellow. Pt has cast to R lower leg that he also wants checked out, because the "heel is wet and you can move it".
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
## 2015-10-03 NOTE — Discharge Instructions (Signed)
Please seek medical attention for any high fevers, chest pain, shortness of breath, change in behavior, persistent vomiting, bloody stool or any other new or concerning symptoms. ° ° °Cellulitis °Cellulitis is an infection of the skin and the tissue under the skin. The infected area is usually red and tender. This happens most often in the arms and lower legs. °HOME CARE  °· Take your antibiotic medicine as told. Finish the medicine even if you start to feel better. °· Keep the infected arm or leg raised (elevated). °· Put a warm cloth on the area up to 4 times per day. °· Only take medicines as told by your doctor. °· Keep all doctor visits as told. °GET HELP IF: °· You see red streaks on the skin coming from the infected area. °· Your red area gets bigger or turns a dark color. °· Your bone or joint under the infected area is painful after the skin heals. °· Your infection comes back in the same area or different area. °· You have a puffy (swollen) bump in the infected area. °· You have new symptoms. °· You have a fever. °GET HELP RIGHT AWAY IF:  °· You feel very sleepy. °· You throw up (vomit) or have watery poop (diarrhea). °· You feel sick and have muscle aches and pains. °  °This information is not intended to replace advice given to you by your health care provider. Make sure you discuss any questions you have with your health care provider. °  °Document Released: 12/10/2007 Document Revised: 03/14/2015 Document Reviewed: 09/08/2011 °Elsevier Interactive Patient Education ©2016 [employer] ° °

## 2016-12-09 ENCOUNTER — Emergency Department
Admission: EM | Admit: 2016-12-09 | Discharge: 2016-12-09 | Disposition: A | Discharge status: Home / Self Care | Payer: Self-pay | Attending: Emergency Medicine | Admitting: Emergency Medicine

## 2016-12-09 ENCOUNTER — Encounter: Payer: Self-pay | Admitting: Emergency Medicine

## 2016-12-09 ENCOUNTER — Emergency Department: Payer: Self-pay

## 2016-12-09 DIAGNOSIS — Z79899 Other long term (current) drug therapy: Secondary | ICD-10-CM | POA: Insufficient documentation

## 2016-12-09 DIAGNOSIS — F17210 Nicotine dependence, cigarettes, uncomplicated: Secondary | ICD-10-CM | POA: Insufficient documentation

## 2016-12-09 DIAGNOSIS — Y93E1 Activity, personal bathing and showering: Secondary | ICD-10-CM | POA: Insufficient documentation

## 2016-12-09 DIAGNOSIS — Y929 Unspecified place or not applicable: Secondary | ICD-10-CM | POA: Insufficient documentation

## 2016-12-09 DIAGNOSIS — W010XXA Fall on same level from slipping, tripping and stumbling without subsequent striking against object, initial encounter: Secondary | ICD-10-CM | POA: Insufficient documentation

## 2016-12-09 DIAGNOSIS — S93491A Sprain of other ligament of right ankle, initial encounter: Secondary | ICD-10-CM | POA: Insufficient documentation

## 2016-12-09 DIAGNOSIS — Y999 Unspecified external cause status: Secondary | ICD-10-CM | POA: Insufficient documentation

## 2016-12-09 MED ORDER — MELOXICAM 15 MG PO TABS: 15.0000 mg | ORAL_TABLET | Freq: Every day | ORAL | 2 refills | Status: AC
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
## 2016-12-09 NOTE — ED Triage Notes (Signed)
Pt with right ankle pain after falling today.

## 2016-12-09 NOTE — ED Notes (Signed)
See triage note  States he slipped getting out of shower   Twisted right ankle  Increased pain with ambulation

## 2017-06-04 ENCOUNTER — Encounter: Payer: Self-pay | Admitting: *Deleted

## 2017-06-04 ENCOUNTER — Emergency Department
Admission: EM | Admit: 2017-06-04 | Discharge: 2017-06-04 | Disposition: A | Discharge status: Home / Self Care | Payer: Self-pay | Attending: Emergency Medicine | Admitting: Emergency Medicine

## 2017-06-04 DIAGNOSIS — M62830 Muscle spasm of back: Secondary | ICD-10-CM

## 2017-06-04 DIAGNOSIS — F17210 Nicotine dependence, cigarettes, uncomplicated: Secondary | ICD-10-CM | POA: Insufficient documentation

## 2017-06-04 LAB — URINALYSIS, COMPLETE (UACMP) WITH MICROSCOPIC
BACTERIA UA: NONE SEEN
Bilirubin Urine: NEGATIVE
Glucose, UA: NEGATIVE mg/dL
HGB URINE DIPSTICK: NEGATIVE
Ketones, ur: 5 mg/dL — AB
Leukocytes, UA: NEGATIVE
Nitrite: NEGATIVE
PH: 5 (ref 5.0–8.0)
Protein, ur: 30 mg/dL — AB
SPECIFIC GRAVITY, URINE: 1.035 — AB (ref 1.005–1.030)
Squamous Epithelial / LPF: NONE SEEN

## 2017-06-04 MED ORDER — IBUPROFEN 800 MG PO TABS: 800.0000 mg | ORAL_TABLET | Freq: Three times a day (TID) | ORAL | 0 refills | Status: DC

## 2017-06-04 MED ORDER — METHOCARBAMOL 500 MG PO TABS
1000.0000 mg | ORAL_TABLET | Freq: Once | ORAL | Status: AC
  Administered 2017-06-04: 1000 mg via ORAL
  Filled 2017-06-04: qty 2

## 2017-06-04 MED ORDER — KETOROLAC TROMETHAMINE 30 MG/ML IJ SOLN
30.0000 mg | Freq: Once | INTRAMUSCULAR | Status: AC
  Administered 2017-06-04: 30 mg via INTRAMUSCULAR
  Filled 2017-06-04: qty 1

## 2017-06-04 MED ORDER — CYCLOBENZAPRINE HCL 5 MG PO TABS: 5.0000 mg | ORAL_TABLET | Freq: Three times a day (TID) | ORAL | 0 refills | Status: DC | PRN

## 2017-06-04 NOTE — ED Triage Notes (Signed)
States lower back pain for 2 days, denies any injury, denies any urinary symptoms, states pain is worse with sitting and standing movement, states hx of kidney stones but states "this isnt that type of pain"

## 2017-06-04 NOTE — ED Provider Notes (Signed)
[facility] Emergency Department Provider Note  ___________________________________________   First MD Initiated Contact with Patient 06/04/17 1020     (approximate)  I have reviewed the triage vital signs and the nursing notes.   HISTORY  Chief Complaint Back Pain  HPI Christian Dickerson is a 31 y.o. male complaint back pain.patient states she began having low back pain 2 days ago. He denies any injury. He states he has a history of kidney stones but this pain is different. Pain is worse with sitting and standing.  He denies any radiation of this pain into his lower extremities. Patient continues to ambulate slowly and is guarding his movements. Patient states that he became extremely bad at work and was driven to the emergency department by coworker.he rates his pain as an 8/10.  History reviewed. No pertinent past medical history.  There are no active problems to display for this patient.   Past Surgical History:  Procedure Laterality Date  . ANKLE SURGERY    . LITHOTRIPSY    . TIBIA FRACTURE SURGERY    . TONSILLECTOMY AND ADENOIDECTOMY      Prior to Admission medications   Medication Sig Start Date End Date Taking? Authorizing Provider  cyclobenzaprine (FLEXERIL) 5 MG tablet Take 1 tablet (5 mg total) by mouth 3 (three) times daily as needed for muscle spasms. 06/04/17   Tommi RumpsSummers, Leylanie Woodmansee L, PA-C  ibuprofen (ADVIL,MOTRIN) 800 MG tablet Take 1 tablet (800 mg total) by mouth 3 (three) times daily. 06/04/17   Tommi RumpsSummers, Arraya Buck L, PA-C    Allergies Patient has no known allergies.  History reviewed. No pertinent family history.  Social History Social History   Tobacco Use  . Smoking status: Current Every Day Smoker    Packs/day: 1.00    Types: Cigarettes  . Smokeless tobacco: Never Used  Substance Use Topics  . Alcohol use: Yes  . Drug use: No    Review of Systems Constitutional: No fever/chills Cardiovascular: Denies chest  pain. Respiratory: Denies shortness of breath. Gastrointestinal: No abdominal pain.  No nausea, no vomiting.  Genitourinary: Negative for dysuria. Musculoskeletal: positive for low back pain. Skin: Negative for rash. Neurological: Negative for  focal weakness or numbness. ____________________________________________   PHYSICAL EXAM:  VITAL SIGNS: ED Triage Vitals  Enc Vitals Group     BP 06/04/17 0913 (!) 154/85     Pulse Rate 06/04/17 0913 90     Resp 06/04/17 0913 18     Temp 06/04/17 0913 98 F (36.7 C)     Temp Source 06/04/17 0913 Oral     SpO2 06/04/17 0913 96 %     Weight 06/04/17 0912 246 lb (111.6 kg)     Height 06/04/17 0912 6\' 1"  (1.854 m)     Head Circumference --      Peak Flow --      Pain Score 06/04/17 0912 8     Pain Loc --      Pain Edu? --      Excl. in GC? --    Constitutional: Alert and oriented. Well appearing and in no acute distress. Eyes: Conjunctivae are normal.  Head: Atraumatic. Neck: No stridor.   Cardiovascular: Normal rate, regular rhythm. Grossly normal heart sounds.  Good peripheral circulation. Respiratory: Normal respiratory effort.  No retractions. Lungs CTAB. Gastrointestinal: Soft and nontender. No distention. No CVA tenderness. Musculoskeletal: on examination of the back there is no gross deformity however there is moderate tenderness on palpation of the paraspinous muscles  bilaterally with the right greater than the left. Range of motion is restricted secondary to pain. Straight leg raises were approximately 90 with increased pain in his lower back. Neurologic:  Normal speech and language. No gross focal neurologic deficits are appreciated. Reflexes are 2+ bilaterally. No gait instability. Skin:  Skin is warm, dry and intact. No rash noted. Psychiatric: Mood and affect are normal. Speech and behavior are normal.  ____________________________________________   LABS (all labs ordered are listed, but only abnormal results are  displayed)  Labs Reviewed  URINALYSIS, COMPLETE (UACMP) WITH MICROSCOPIC - Abnormal; Notable for the following components:      Result Value   Color, Urine AMBER (*)    APPearance CLEAR (*)    Specific Gravity, Urine 1.035 (*)    Ketones, ur 5 (*)    Protein, ur 30 (*)    All other components within normal limits    PROCEDURES  Procedure(s) performed: None  Procedures  Critical Care performed: No  ____________________________________________   INITIAL IMPRESSION / ASSESSMENT AND PLAN / ED COURSE Patient was given Toradol 30 mg IM and Robaxin 1000 mg by mouth while in the emergency department. He was reassured that urinalysis did not show any evidence suspicious for kidney stone. Patient states that he is feeling somewhat better. Patient was discharged with prescription for ibuprofen and Robaxin one or 2 tablets every 6 hours as needed for muscle spasms. He is aware that he should not drive or operate machinery while taking the muscle laxity. He is follow[facility]up with [facility] if any continued problems.  ____________________________________________   FINAL CLINICAL IMPRESSION(S) / ED DIAGNOSES  Final diagnoses:  Muscle spasm of back     ED Discharge Orders        Ordered    ibuprofen (ADVIL,MOTRIN) 800 MG tablet  3 times daily     06/04/17 1210    cyclobenzaprine (FLEXERIL) 5 MG tablet  3 times daily PRN     06/04/17 1210       Note:  This document was prepared using Dragon voice recognition software and may include unintentional dictation errors.    Tommi RumpsSummers, Edmundo Tedesco L, PA-C 06/04/17 1320    Minna AntisPaduchowski, Kevin, MD 06/04/17 [phone]

## 2017-06-04 NOTE — ED Notes (Signed)
See triage note  States he developed lower back pain yesterday  Denies any injury  States pain moves from lower back into right hip   States pain was worse this am  Ambulates well to treatment room

## 2017-06-04 NOTE — Discharge Instructions (Signed)
Follow[facility] or call and make an appointment with [facility]. Begin taking medication as directed. Do not take Flexeril while driving or operating machinery as this medication could cause drowsiness increase your  risk for injury.   Moist heat or ice to your back as needed for comfort.

## 2018-08-18 ENCOUNTER — Emergency Department: Payer: Self-pay

## 2018-08-18 ENCOUNTER — Other Ambulatory Visit: Payer: Self-pay

## 2018-08-18 ENCOUNTER — Emergency Department
Admission: EM | Admit: 2018-08-18 | Discharge: 2018-08-18 | Disposition: A | Discharge status: Home / Self Care | Payer: Self-pay | Attending: Student in an Organized Health Care Education/Training Program | Admitting: Student in an Organized Health Care Education/Training Program

## 2018-08-18 ENCOUNTER — Encounter: Payer: Self-pay | Admitting: Emergency Medicine

## 2018-08-18 DIAGNOSIS — Z79899 Other long term (current) drug therapy: Secondary | ICD-10-CM | POA: Insufficient documentation

## 2018-08-18 DIAGNOSIS — J40 Bronchitis, not specified as acute or chronic: Secondary | ICD-10-CM

## 2018-08-18 DIAGNOSIS — R079 Chest pain, unspecified: Secondary | ICD-10-CM

## 2018-08-18 DIAGNOSIS — F17210 Nicotine dependence, cigarettes, uncomplicated: Secondary | ICD-10-CM | POA: Insufficient documentation

## 2018-08-18 LAB — CBC
HCT: 49.4 % (ref 39.0–52.0)
Hemoglobin: 17 g/dL (ref 13.0–17.0)
MCH: 33.5 pg (ref 26.0–34.0)
MCHC: 34.4 g/dL (ref 30.0–36.0)
MCV: 97.2 fL (ref 80.0–100.0)
PLATELETS: 208 10*3/uL (ref 150–400)
RBC: 5.08 MIL/uL (ref 4.22–5.81)
RDW: 13.2 % (ref 11.5–15.5)
WBC: 10.9 10*3/uL — ABNORMAL HIGH (ref 4.0–10.5)
nRBC: 0 % (ref 0.0–0.2)

## 2018-08-18 LAB — BASIC METABOLIC PANEL
ANION GAP: 9 (ref 5–15)
BUN: 16 mg/dL (ref 6–20)
CO2: 24 mmol/L (ref 22–32)
Calcium: 9.6 mg/dL (ref 8.9–10.3)
Chloride: 104 mmol/L (ref 98–111)
Creatinine, Ser: 0.91 mg/dL (ref 0.61–1.24)
GFR calc non Af Amer: >60 mL/min (ref >60)
Glucose, Bld: 108 mg/dL — ABNORMAL HIGH (ref 70–99)
Potassium: 4.5 mmol/L (ref 3.5–5.1)
Sodium: 137 mmol/L (ref 135–145)

## 2018-08-18 LAB — TROPONIN I: Troponin I: <0.03 ng/mL (ref <0.03)

## 2018-08-18 MED ORDER — AZITHROMYCIN 500 MG PO TABS: 500.0000 mg | ORAL_TABLET | Freq: Every day | ORAL | 0 refills | Status: AC

## 2018-08-18 MED ORDER — ALBUTEROL SULFATE HFA 108 (90 BASE) MCG/ACT IN AERS: 2.0000 | INHALATION_SPRAY | Freq: Four times a day (QID) | RESPIRATORY_TRACT | 2 refills | Status: DC | PRN

## 2018-08-18 MED ORDER — SODIUM CHLORIDE 0.9% FLUSH: 3.0000 mL | Freq: Once | INTRAVENOUS | Status: DC

## 2018-08-18 MED ORDER — IOHEXOL 350 MG/ML SOLN
75.0000 mL | Freq: Once | INTRAVENOUS | Status: AC | PRN
  Administered 2018-08-18: 75 mL via INTRAVENOUS

## 2018-08-18 MED ORDER — PREDNISONE 20 MG PO TABS: 40.0000 mg | ORAL_TABLET | Freq: Every day | ORAL | 0 refills | Status: AC

## 2018-08-18 NOTE — ED Provider Notes (Signed)
[facility] Emergency Department Provider Note    First MD Initiated Contact with Patient 08/18/18 1153     (approximate)  I have reviewed the triage vital signs and the nursing notes.   HISTORY  Chief Complaint Chest Pain and Shortness of Breath    HPI Christian Dickerson is a 33 y.o. male presents the ER for evaluation of left-sided chest pain that does hurt when he takes deep breath associated with hemoptysis coughing up pink-tinged phlegm shortness of breath as well.  Denies any history of heart disease personally but does have a history of 1 pack/day smoking as well as alcohol use and history of marijuana use.  Was recently incarcerated.  No travel outside of the country.  States that pain is mild to moderate in severity.    History reviewed. No pertinent past medical history. History reviewed. No pertinent family history. Past Surgical History:  Procedure Laterality Date  . ANKLE SURGERY    . CHEST TUBE INSERTION    . LITHOTRIPSY    . TIBIA FRACTURE SURGERY    . TONSILLECTOMY AND ADENOIDECTOMY     There are no active problems to display for this patient.     Prior to Admission medications   Medication Sig Start Date End Date Taking? Authorizing Provider  albuterol (PROVENTIL HFA;VENTOLIN HFA) 108 (90 Base) MCG/ACT inhaler Inhale 2 puffs into the lungs every 6 (six) hours as needed for wheezing or shortness of breath. 08/18/18   Willy Eddy, MD  azithromycin (ZITHROMAX) 500 MG tablet Take 1 tablet (500 mg total) by mouth daily for 3 days. Take 1 tablet daily for 3 days. 08/18/18 08/21/18  Willy Eddy, MD  cyclobenzaprine (FLEXERIL) 5 MG tablet Take 1 tablet (5 mg total) by mouth 3 (three) times daily as needed for muscle spasms. 06/04/17   Tommi Rumps, PA-C  ibuprofen (ADVIL,MOTRIN) 800 MG tablet Take 1 tablet (800 mg total) by mouth 3 (three) times daily. 06/04/17   Tommi Rumps, PA-C  predniSONE (DELTASONE) 20 MG tablet Take 2  tablets (40 mg total) by mouth daily for 5 days. 08/18/18 08/23/18  Willy Eddy, MD    Allergies Patient has no known allergies.    Social History Social History   Tobacco Use  . Smoking status: Current Every Day Smoker    Packs/day: 1.00    Types: Cigarettes  . Smokeless tobacco: Never Used  Substance Use Topics  . Alcohol use: Yes  . Drug use: No    Review of Systems Patient denies headaches, rhinorrhea, blurry vision, numbness, shortness of breath, chest pain, edema, cough, abdominal pain, nausea, vomiting, diarrhea, dysuria, fevers, rashes or hallucinations unless otherwise stated above in HPI. ____________________________________________   PHYSICAL EXAM:  VITAL SIGNS: Vitals:   08/18/18 1200 08/18/18 1230  BP: (!) 136/95 (!) 141/94  Pulse: 87 88  Resp:    Temp:    SpO2: 97% 98%    Constitutional: Alert and oriented.  Eyes: Conjunctivae are normal.  Head: Atraumatic. Nose: No congestion/rhinnorhea. Mouth/Throat: Mucous membranes are moist.   Neck: No stridor. Painless ROM.  Cardiovascular: Normal rate, regular rhythm. Grossly normal heart sounds.  Good peripheral circulation. Respiratory: Normal respiratory effort.  No retractions. Lungs CTAB. Gastrointestinal: Soft and nontender. No distention. No abdominal bruits. No CVA tenderness. Genitourinary:  Musculoskeletal: No lower extremity tenderness nor edema.  No joint effusions. Neurologic:  Normal speech and language. No gross focal neurologic deficits are appreciated. No facial droop Skin:  Skin is warm, dry  and intact. No rash noted. Psychiatric: Mood and affect are normal. Speech and behavior are normal.  ____________________________________________   LABS (all labs ordered are listed, but only abnormal results are displayed)  Results for orders placed or performed during the hospital encounter of 08/18/18 (from the past 24 hour(s))  Basic metabolic panel     Status: Abnormal   Collection Time:  08/18/18 10:30 AM  Result Value Ref Range   Sodium 137 135 - 145 mmol/L   Potassium 4.5 3.5 - 5.1 mmol/L   Chloride 104 98 - 111 mmol/L   CO2 24 22 - 32 mmol/L   Glucose, Bld 108 (H) 70 - 99 mg/dL   BUN 16 6 - 20 mg/dL   Creatinine, Ser 9.600.91 0.61 - 1.24 mg/dL   Calcium 9.6 8.9 - 45.410.3 mg/dL   GFR calc non Af Amer >60 >60 mL/min   GFR calc Af Amer >60 >60 mL/min   Anion gap 9 5 - 15  CBC     Status: Abnormal   Collection Time: 08/18/18 10:30 AM  Result Value Ref Range   WBC 10.9 (H) 4.0 - 10.5 K/uL   RBC 5.08 4.22 - 5.81 MIL/uL   Hemoglobin 17.0 13.0 - 17.0 g/dL   HCT 09.849.4 11.939.0 - 14.752.0 %   MCV 97.2 80.0 - 100.0 fL   MCH 33.5 26.0 - 34.0 pg   MCHC 34.4 30.0 - 36.0 g/dL   RDW 82.913.2 56.211.5 - 13.015.5 %   Platelets 208 150 - 400 K/uL   nRBC 0.0 0.0 - 0.2 %  Troponin I - ONCE - STAT     Status: None   Collection Time: 08/18/18 10:30 AM  Result Value Ref Range   Troponin I <0.03 <0.03 ng/mL   ____________________________________________  EKG My review and personal interpretation at Time: 10:00   Indication: sob  Rate: 95  Rhythm: sinus Axis: normal Other: non specific st abn, no stemi ____________________________________________  RADIOLOGY  I personally reviewed all radiographic images ordered to evaluate for the above acute complaints and reviewed radiology reports and findings.  These findings were personally discussed with the patient.  Please see medical record for radiology report.  ____________________________________________   PROCEDURES  Procedure(s) performed:  Procedures    Critical Care performed: no ____________________________________________   INITIAL IMPRESSION / ASSESSMENT AND PLAN / ED COURSE  Pertinent labs & imaging results that were available during my care of the patient were reviewed by me and considered in my medical decision making (see chart for details).   DDX: ACS, pericarditis, esophagitis, boerhaaves, pe, dissection, pna, bronchitis,  costochondritis   Christian Dickerson is a 33 y.o. who presents to the ED with symptoms as described above.  Patient arrives in no acute distress but is tachycardic with hemoptysis reported.  Chest x-ray without any evidence of pneumonia.  Blood work otherwise reassuring.  May be having bronchitis but given his description of symptoms without any significant wheeze on exam will order CT imaging the patient is high risk for PE.  Doubt ACS.  Abdominal exam is otherwise soft benign.  Clinical Course as of Aug 18 1328  Wed Aug 18, 2018  1330 CT imaging is reassuring.  No evidence of PE.  Will be treated for bronchitis.  No evidence of ACS.  Stable and appropriate for outpatient follow-up.   [PR]    Clinical Course User Index [PR] Willy Eddyobinson, Trystin Hargrove, MD     As part of my medical decision making, I reviewed the following data  within the electronic MEDICAL RECORD NUMBER Nursing notes reviewed and incorporated, Labs reviewed, notes from prior ED visits and [place] Controlled Substance Database   ____________________________________________   FINAL CLINICAL IMPRESSION(S) / ED DIAGNOSES  Final diagnoses:  Chest pain, unspecified type  Bronchitis      NEW MEDICATIONS STARTED DURING THIS VISIT:  New Prescriptions   ALBUTEROL (PROVENTIL HFA;VENTOLIN HFA) 108 (90 BASE) MCG/ACT INHALER    Inhale 2 puffs into the lungs every 6 (six) hours as needed for wheezing or shortness of breath.   AZITHROMYCIN (ZITHROMAX) 500 MG TABLET    Take 1 tablet (500 mg total) by mouth daily for 3 days. Take 1 tablet daily for 3 days.   PREDNISONE (DELTASONE) 20 MG TABLET    Take 2 tablets (40 mg total) by mouth daily for 5 days.     Note:  This document was prepared using Dragon voice recognition software and may include unintentional dictation errors.    Willy Eddyobinson, Madeleine Fenn, MD 08/18/18 1330

## 2018-08-18 NOTE — ED Triage Notes (Signed)
Pt presents to ED via POV with c/o CP and SOB. Pt c/o cough with pink sputum that tastes like pennies. Pt states pain to L chest at this time, worse with deep inspiration intermittently.

## 2018-08-18 NOTE — ED Notes (Signed)
Patient to Triage 2, EKG completed.

## 2018-08-18 NOTE — ED Notes (Signed)
Patient transported to CT 

## 2019-01-18 ENCOUNTER — Emergency Department
Admission: EM | Admit: 2019-01-18 | Discharge: 2019-01-18 | Disposition: A | Discharge status: Home / Self Care | Payer: Self-pay | Attending: Emergency Medicine | Admitting: Emergency Medicine

## 2019-01-18 ENCOUNTER — Encounter: Payer: Self-pay | Admitting: Emergency Medicine

## 2019-01-18 ENCOUNTER — Other Ambulatory Visit: Payer: Self-pay

## 2019-01-18 DIAGNOSIS — F17210 Nicotine dependence, cigarettes, uncomplicated: Secondary | ICD-10-CM | POA: Insufficient documentation

## 2019-01-18 DIAGNOSIS — Z79899 Other long term (current) drug therapy: Secondary | ICD-10-CM | POA: Insufficient documentation

## 2019-01-18 DIAGNOSIS — M62830 Muscle spasm of back: Secondary | ICD-10-CM | POA: Insufficient documentation

## 2019-01-18 MED ORDER — KETOROLAC TROMETHAMINE 30 MG/ML IJ SOLN
30.0000 mg | Freq: Once | INTRAMUSCULAR | Status: AC
  Administered 2019-01-18: 30 mg via INTRAMUSCULAR
  Filled 2019-01-18: qty 1

## 2019-01-18 MED ORDER — METHOCARBAMOL 500 MG PO TABS: 500.0000 mg | ORAL_TABLET | Freq: Four times a day (QID) | ORAL | 0 refills | Status: DC

## 2019-01-18 MED ORDER — MELOXICAM 15 MG PO TABS: 15.0000 mg | ORAL_TABLET | Freq: Every day | ORAL | 0 refills | Status: DC

## 2019-01-18 MED ORDER — ORPHENADRINE CITRATE 30 MG/ML IJ SOLN
60.0000 mg | Freq: Once | INTRAMUSCULAR | Status: AC
  Administered 2019-01-18: 60 mg via INTRAMUSCULAR
  Filled 2019-01-18: qty 2

## 2019-01-18 NOTE — ED Triage Notes (Signed)
Patient to the ER for c/o lower back pain since 7/5, worse today. Patient states he slept on floor prior to pain starting. Reports having similar pain in the past that went away on its own. Denies any injury.

## 2019-01-18 NOTE — ED Notes (Signed)
See triage note  Presents with lower back pain which is moving into both legs  Ambulates slowly d/t increased pain with movement  States he fell about 8-9 days ago
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
## 2019-04-29 ENCOUNTER — Emergency Department
Admission: EM | Admit: 2019-04-29 | Discharge: 2019-04-29 | Disposition: A | Discharge status: Home / Self Care | Payer: Self-pay | Attending: Emergency Medicine | Admitting: Emergency Medicine

## 2019-04-29 ENCOUNTER — Encounter: Payer: Self-pay | Admitting: Intensive Care

## 2019-04-29 ENCOUNTER — Emergency Department: Payer: Self-pay

## 2019-04-29 ENCOUNTER — Other Ambulatory Visit: Payer: Self-pay

## 2019-04-29 DIAGNOSIS — F17210 Nicotine dependence, cigarettes, uncomplicated: Secondary | ICD-10-CM | POA: Insufficient documentation

## 2019-04-29 DIAGNOSIS — N200 Calculus of kidney: Secondary | ICD-10-CM

## 2019-04-29 DIAGNOSIS — N201 Calculus of ureter: Secondary | ICD-10-CM | POA: Insufficient documentation

## 2019-04-29 DIAGNOSIS — N1330 Unspecified hydronephrosis: Secondary | ICD-10-CM | POA: Insufficient documentation

## 2019-04-29 DIAGNOSIS — Z79899 Other long term (current) drug therapy: Secondary | ICD-10-CM | POA: Insufficient documentation

## 2019-04-29 LAB — CBC
HCT: 46.5 % (ref 39.0–52.0)
Hemoglobin: 16.2 g/dL (ref 13.0–17.0)
MCH: 34.5 pg — ABNORMAL HIGH (ref 26.0–34.0)
MCHC: 34.8 g/dL (ref 30.0–36.0)
MCV: 98.9 fL (ref 80.0–100.0)
Platelets: 198 10*3/uL (ref 150–400)
RBC: 4.7 MIL/uL (ref 4.22–5.81)
RDW: 12.1 % (ref 11.5–15.5)
WBC: 9.5 10*3/uL (ref 4.0–10.5)
nRBC: 0 % (ref 0.0–0.2)

## 2019-04-29 LAB — COMPREHENSIVE METABOLIC PANEL
ALT: 96 U/L — ABNORMAL HIGH (ref 0–44)
AST: 64 U/L — ABNORMAL HIGH (ref 15–41)
Albumin: 4.4 g/dL (ref 3.5–5.0)
Alkaline Phosphatase: 74 U/L (ref 38–126)
Anion gap: 14 (ref 5–15)
BUN: 15 mg/dL (ref 6–20)
CO2: 23 mmol/L (ref 22–32)
Calcium: 9.6 mg/dL (ref 8.9–10.3)
Chloride: 102 mmol/L (ref 98–111)
Creatinine, Ser: 1.03 mg/dL (ref 0.61–1.24)
GFR calc Af Amer: >60 mL/min (ref >60)
GFR calc non Af Amer: >60 mL/min (ref >60)
Glucose, Bld: 119 mg/dL — ABNORMAL HIGH (ref 70–99)
Potassium: 3.9 mmol/L (ref 3.5–5.1)
Sodium: 139 mmol/L (ref 135–145)
Total Bilirubin: 1.1 mg/dL (ref 0.3–1.2)
Total Protein: 7.8 g/dL (ref 6.5–8.1)

## 2019-04-29 LAB — LIPASE, BLOOD: Lipase: 29 U/L (ref 11–51)

## 2019-04-29 MED ORDER — FENTANYL CITRATE (PF) 100 MCG/2ML IJ SOLN
50.0000 ug | INTRAMUSCULAR | Status: DC | PRN
  Administered 2019-04-29: 50 ug via INTRAVENOUS
  Filled 2019-04-29: qty 2

## 2019-04-29 MED ORDER — TAMSULOSIN HCL 0.4 MG PO CAPS: 0.4000 mg | ORAL_CAPSULE | Freq: Every day | ORAL | 0 refills | Status: DC

## 2019-04-29 MED ORDER — ONDANSETRON 4 MG PO TBDP: 4.0000 mg | ORAL_TABLET | Freq: Three times a day (TID) | ORAL | 0 refills | Status: DC | PRN

## 2019-04-29 MED ORDER — ONDANSETRON HCL 4 MG/2ML IJ SOLN
4.0000 mg | Freq: Once | INTRAMUSCULAR | Status: AC
  Administered 2019-04-29: 4 mg via INTRAVENOUS
  Filled 2019-04-29: qty 2

## 2019-04-29 MED ORDER — NAPROXEN 500 MG PO TABS: 500.0000 mg | ORAL_TABLET | Freq: Two times a day (BID) | ORAL | 2 refills | Status: DC

## 2019-04-29 MED ORDER — HYDROCODONE-ACETAMINOPHEN 5-325 MG PO TABS: 1.0000 | ORAL_TABLET | ORAL | 0 refills | Status: DC | PRN

## 2019-04-29 MED ORDER — KETOROLAC TROMETHAMINE 30 MG/ML IJ SOLN
30.0000 mg | Freq: Once | INTRAMUSCULAR | Status: AC
  Administered 2019-04-29: 30 mg via INTRAVENOUS
  Filled 2019-04-29: qty 1

## 2019-04-29 NOTE — ED Notes (Signed)
Pt found pacing around the room, saying it is painful when he lays down.

## 2019-04-29 NOTE — ED Triage Notes (Signed)
Patient c/o right flank pain that started this AM that radiates down to testicles. HX kidney stones

## 2019-04-29 NOTE — ED Provider Notes (Signed)
[facility] Emergency Department Provider Note   ____________________________________________    I have reviewed the triage vital signs and the nursing notes.   HISTORY  Chief Complaint Flank Pain (right)     HPI Christian Dickerson is a 33 y.o. male with a history of kidney stones several years ago who presents with abrupt onset of right flank pain that radiates into his right testicle.  He reports the pain is severe, he woke up with this pain this morning.  He is not take anything for it.  Denies fevers or chills.  No dysuria.  Positive nausea, no vomiting.  No sick contacts,  History reviewed. No pertinent past medical history.  There are no active problems to display for this patient.   Past Surgical History:  Procedure Laterality Date  . ANKLE SURGERY    . CHEST TUBE INSERTION    . LITHOTRIPSY    . TIBIA FRACTURE SURGERY    . TONSILLECTOMY AND ADENOIDECTOMY      Prior to Admission medications   Medication Sig Start Date End Date Taking? Authorizing Provider  albuterol (PROVENTIL HFA;VENTOLIN HFA) 108 (90 Base) MCG/ACT inhaler Inhale 2 puffs into the lungs every 6 (six) hours as needed for wheezing or shortness of breath. 08/18/18   Willy Eddy, MD  HYDROcodone-acetaminophen (NORCO/VICODIN) 5-325 MG tablet Take 1 tablet by mouth every 4 (four) hours as needed for moderate pain. 04/29/19   Jene Every, MD  meloxicam (MOBIC) 15 MG tablet Take 1 tablet (15 mg total) by mouth daily. 01/18/19   Cuthriell, Delorise Royals, PA-C  methocarbamol (ROBAXIN) 500 MG tablet Take 1 tablet (500 mg total) by mouth 4 (four) times daily. 01/18/19   Cuthriell, Delorise Royals, PA-C  naproxen (NAPROSYN) 500 MG tablet Take 1 tablet (500 mg total) by mouth 2 (two) times daily with a meal. 04/29/19   Jene Every, MD  ondansetron (ZOFRAN ODT) 4 MG disintegrating tablet Take 1 tablet (4 mg total) by mouth every 8 (eight) hours as needed. 04/29/19   Jene Every, MD   tamsulosin (FLOMAX) 0.4 MG CAPS capsule Take 1 capsule (0.4 mg total) by mouth daily. 04/29/19   Jene Every, MD     Allergies Patient has no known allergies.  History reviewed. No pertinent family history.  Social History Social History   Tobacco Use  . Smoking status: Current Every Day Smoker    Packs/day: 1.00    Types: Cigarettes  . Smokeless tobacco: Never Used  Substance Use Topics  . Alcohol use: Yes    Alcohol/week: 35.0 standard drinks    Types: 35 Shots of liquor per week    Comment: 1/5 a day  . Drug use: No    Review of Systems  Constitutional: No fever/chills Eyes: No visual changes.  ENT: No sore throat. Cardiovascular: Denies chest pain. Respiratory: Denies shortness of breath. Gastrointestinal: As above Genitourinary: As above Musculoskeletal: Negative for back pain. Skin: Negative for rash. Neurological: Negative for headaches   ____________________________________________   PHYSICAL EXAM:  VITAL SIGNS: ED Triage Vitals  Enc Vitals Group     BP 04/29/19 0943 (!) 142/101     Pulse Rate 04/29/19 0941 87     Resp 04/29/19 0941 18     Temp 04/29/19 0941 97.7 F (36.5 C)     Temp Source 04/29/19 0941 Oral     SpO2 04/29/19 0941 97 %     Weight 04/29/19 0942 110.2 kg (243 lb)     Height 04/29/19  7416 1.854 m (6\' 1" )     Head Circumference --      Peak Flow --      Pain Score 04/29/19 0942 10     Pain Loc --      Pain Edu? --      Excl. in [place]? --     Constitutional: Alert and oriented.   Nose: No congestion/rhinnorhea.  Cardiovascular: Normal rate, regular rhythm. Grossly normal heart sounds.  Good peripheral circulation. Respiratory: Normal respiratory effort.  No retractions. Lungs CTAB. Gastrointestinal: Soft and nontender. No distention.  No CVA tenderness. Genitourinary: deferred Musculoskeletal: No lower extremity tenderness nor edema.  Warm and well perfused Neurologic:  Normal speech and language. No gross focal neurologic  deficits are appreciated.  Skin:  Skin is warm, dry and intact. No rash noted. Psychiatric: Mood and affect are normal. Speech and behavior are normal.  ____________________________________________   LABS (all labs ordered are listed, but only abnormal results are displayed)  Labs Reviewed  COMPREHENSIVE METABOLIC PANEL - Abnormal; Notable for the following components:      Result Value   Glucose, Bld 119 (*)    AST 64 (*)    ALT 96 (*)    All other components within normal limits  CBC - Abnormal; Notable for the following components:   MCH 34.5 (*)    All other components within normal limits  LIPASE, BLOOD  URINALYSIS, COMPLETE (UACMP) WITH MICROSCOPIC   ____________________________________________  EKG   ____________________________________________  RADIOLOGY  CT renal stone study ____________________________________________   PROCEDURES  Procedure(s) performed: No  Procedures   Critical Care performed: No ____________________________________________   INITIAL IMPRESSION / ASSESSMENT AND PLAN / ED COURSE  Pertinent labs & imaging results that were available during my care of the patient were reviewed by me and considered in my medical decision making (see chart for details).  Patient presents with right-sided flank pain of abrupt onset highly suspicious for ureterolithiasis.  Received IV fentanyl with some improvement, we will treat with IV Toradol, obtain CT renal stone study and reevaluate.  CT demonstrates 2 mm distal ureter stone.  Patient had total relief after IV Toradol and is feeling well.  He has not had any dysuria or fever.  He is anxious to go home and I think this is appropriate.  Strict return precautions if any dysuria fever worsening pain.    ____________________________________________   FINAL CLINICAL IMPRESSION(S) / ED DIAGNOSES  Final diagnoses:  Kidney stone        Note:  This document was prepared using Dragon voice  recognition software and may include unintentional dictation errors.   Lavonia Drafts, MD 04/29/19 [phone]

## 2019-04-29 NOTE — ED Notes (Signed)
Pt given urine strainer and educated on catching kidney stone and bringing stone in sterile specimen cup to follow-up appt with primary md for evaluation. Pt explained he has had kidney stones in the past and expressed understanding the process. Pt has no questions or concerns at this time.

## 2019-11-19 ENCOUNTER — Encounter: Payer: Self-pay | Admitting: Emergency Medicine

## 2019-11-19 ENCOUNTER — Other Ambulatory Visit: Payer: Self-pay

## 2019-11-19 ENCOUNTER — Emergency Department
Admission: EM | Admit: 2019-11-19 | Discharge: 2019-11-19 | Disposition: A | Discharge status: Home / Self Care | Payer: Self-pay | Attending: Emergency Medicine | Admitting: Emergency Medicine

## 2019-11-19 DIAGNOSIS — Z79899 Other long term (current) drug therapy: Secondary | ICD-10-CM | POA: Insufficient documentation

## 2019-11-19 DIAGNOSIS — F17210 Nicotine dependence, cigarettes, uncomplicated: Secondary | ICD-10-CM | POA: Insufficient documentation

## 2019-11-19 DIAGNOSIS — M545 Low back pain, unspecified: Secondary | ICD-10-CM

## 2019-11-19 MED ORDER — CYCLOBENZAPRINE HCL 10 MG PO TABS
10.0000 mg | ORAL_TABLET | Freq: Three times a day (TID) | ORAL | 0 refills | Status: DC | PRN
Start: 2019-11-19 — End: 2024-03-13

## 2019-11-19 MED ORDER — OXYCODONE-ACETAMINOPHEN 5-325 MG PO TABS
1.0000 | ORAL_TABLET | Freq: Once | ORAL | Status: AC
  Administered 2019-11-19: 1 via ORAL
  Filled 2019-11-19: qty 1

## 2019-11-19 MED ORDER — CYCLOBENZAPRINE HCL 10 MG PO TABS
10.0000 mg | ORAL_TABLET | Freq: Once | ORAL | Status: AC
  Administered 2019-11-19: 10 mg via ORAL
  Filled 2019-11-19: qty 1

## 2019-11-19 MED ORDER — TRAMADOL HCL 50 MG PO TABS: 50.0000 mg | ORAL_TABLET | Freq: Four times a day (QID) | ORAL | 0 refills | Status: DC | PRN

## 2019-11-19 MED ORDER — KETOROLAC TROMETHAMINE 30 MG/ML IJ SOLN
30.0000 mg | Freq: Once | INTRAMUSCULAR | Status: AC
  Administered 2019-11-19: 30 mg via INTRAMUSCULAR
  Filled 2019-11-19: qty 1

## 2019-11-19 MED ORDER — PREDNISONE 10 MG (21) PO TBPK: ORAL_TABLET | ORAL | 0 refills | Status: DC

## 2019-11-19 MED ORDER — LIDOCAINE 5 % EX PTCH
1.0000 | MEDICATED_PATCH | CUTANEOUS | Status: DC
  Administered 2019-11-19: 1 via TRANSDERMAL
  Filled 2019-11-19: qty 1

## 2019-11-19 NOTE — Discharge Instructions (Addendum)
Follow-up with orthopedics if not improving in 1 week.  Return emergency department worsening.  Take your medications as prescribed.  Apply wet heat followed by ice.  Wet heat should be applied across the lower back to loosen the muscles.  Ice is to be applied directly on the spine to decrease inflammation.

## 2019-11-19 NOTE — ED Provider Notes (Signed)
[facility] Emergency Department Provider Note  ____________________________________________   First MD Initiated Contact with Patient 11/19/19 1222     (approximate)  I have reviewed the triage vital signs and the nursing notes.   HISTORY  Chief Complaint Back Pain    HPI Christian Dickerson is a 34 y.o. male presents to the ED complaining of low back pain that started yesterday but today worsened.  States that he has a history of the same.  States his muscles will lock up frequently.  States he is unable to go from sitting to standing.  States feels like it gets gripping in his lower back.  He denies any numbness or tingling.  No abdominal pain.  No hematuria.  No fever or chills.  Pain while lying still is 5/10.  Pain with movement is 12/10.    History reviewed. No pertinent past medical history.  There are no problems to display for this patient.   Past Surgical History:  Procedure Laterality Date  . ANKLE SURGERY    . CHEST TUBE INSERTION    . LITHOTRIPSY    . TIBIA FRACTURE SURGERY    . TONSILLECTOMY AND ADENOIDECTOMY      Prior to Admission medications   Medication Sig Start Date End Date Taking? Authorizing Provider  albuterol (PROVENTIL HFA;VENTOLIN HFA) 108 (90 Base) MCG/ACT inhaler Inhale 2 puffs into the lungs every 6 (six) hours as needed for wheezing or shortness of breath. 08/18/18   Merlyn Lot, MD  cyclobenzaprine (FLEXERIL) 10 MG tablet Take 1 tablet (10 mg total) by mouth 3 (three) times daily as needed. 11/19/19   Teresia Myint, Linden Dolin, PA-C  HYDROcodone-acetaminophen (NORCO/VICODIN) 5-325 MG tablet Take 1 tablet by mouth every 4 (four) hours as needed for moderate pain. 04/29/19   Lavonia Drafts, MD  meloxicam (MOBIC) 15 MG tablet Take 1 tablet (15 mg total) by mouth daily. 01/18/19   Cuthriell, Charline Bills, PA-C  methocarbamol (ROBAXIN) 500 MG tablet Take 1 tablet (500 mg total) by mouth 4 (four) times daily. 01/18/19   Cuthriell,  Charline Bills, PA-C  naproxen (NAPROSYN) 500 MG tablet Take 1 tablet (500 mg total) by mouth 2 (two) times daily with a meal. 04/29/19   Lavonia Drafts, MD  ondansetron (ZOFRAN ODT) 4 MG disintegrating tablet Take 1 tablet (4 mg total) by mouth every 8 (eight) hours as needed. 04/29/19   Lavonia Drafts, MD  predniSONE (STERAPRED UNI-PAK 21 TAB) 10 MG (21) TBPK tablet Take 6 pills on day one then decrease by 1 pill each day 11/19/19   Versie Starks, PA-C  tamsulosin (FLOMAX) 0.4 MG CAPS capsule Take 1 capsule (0.4 mg total) by mouth daily. 04/29/19   Lavonia Drafts, MD  traMADol (ULTRAM) 50 MG tablet Take 1 tablet (50 mg total) by mouth every 6 (six) hours as needed. 11/19/19   Versie Starks, PA-C    Allergies Patient has no known allergies.  History reviewed. No pertinent family history.  Social History Social History   Tobacco Use  . Smoking status: Current Every Day Smoker    Packs/day: 1.00    Types: Cigarettes  . Smokeless tobacco: Never Used  Substance Use Topics  . Alcohol use: Yes    Alcohol/week: 35.0 standard drinks    Types: 35 Shots of liquor per week    Comment: 1/5 a day  . Drug use: No    Review of Systems  Constitutional: No fever/chills Eyes: No visual changes. ENT: No sore throat. Respiratory:  Denies cough Cardiovascular: Denies chest pain Gastrointestinal: Denies abdominal pain Genitourinary: Negative for dysuria. Musculoskeletal: Positive for back pain. Skin: Negative for rash. Psychiatric: no mood changes,     ____________________________________________   PHYSICAL EXAM:  VITAL SIGNS: ED Triage Vitals  Enc Vitals Group     BP 11/19/19 1141 (!) 150/100     Pulse Rate 11/19/19 1141 99     Resp 11/19/19 1141 (!) 22     Temp 11/19/19 1141 98 F (36.7 C)     Temp Source 11/19/19 1141 Oral     SpO2 11/19/19 1141 95 %     Weight 11/19/19 1140 238 lb (108 kg)     Height 11/19/19 1140 6\' 1"  (1.854 m)     Head Circumference --      Peak Flow --        Pain Score 11/19/19 1140 7     Pain Loc --      Pain Edu? --      Excl. in GC? --     Constitutional: Alert and oriented. Well appearing and in no acute distress. Eyes: Conjunctivae are normal.  Head: Atraumatic. Nose: No congestion/rhinnorhea. Mouth/Throat: Mucous membranes are moist.   Neck:  supple no lymphadenopathy noted Cardiovascular: Normal rate, regular rhythm. Respiratory: Normal respiratory effort.  No retractions, Abd: soft nontender bs normal all 4 quad GU: deferred Musculoskeletal: FROM all extremities, warm and well perfused, spine is nontender, lumbar muscles are spasmed and tender bilaterally, patient has difficulty going from lying to sitting.  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 Neurologic:  Normal speech and language.  Skin:  Skin is warm, dry and intact. No rash noted. Psychiatric: Mood and affect are normal. Speech and behavior are normal.  ____________________________________________   LABS (all labs ordered are listed, but only abnormal results are displayed)  Labs Reviewed - No data to display ____________________________________________   ____________________________________________  RADIOLOGY    ____________________________________________   PROCEDURES  Procedure(s) performed: Lidoderm patch, Flexeril 10 mg p.o., Percocet 1 p.o., Toradol 30 mg IM   Procedures    ____________________________________________   INITIAL IMPRESSION / ASSESSMENT AND PLAN / ED COURSE  Pertinent labs & imaging results that were available during my care of the patient were reviewed by me and considered in my medical decision making (see chart for details).   Patient is 34 year old male presents emergency department with complaints of low back pain.  See HPI  Physical exam shows patient to appear uncomfortable.  He has difficulty going from lying to sitting.  Lumbar muscles are tender to palpation but  there is no spinal tenderness.  Full strength in the lower legs.  Patient is able to wiggle his toes on the area that is not amputated.  Neurovascular appears to be intact.  I explained the findings to the patient.  He was given Lidoderm patch, Toradol 30 mg IM, Flexeril 10 mg p.o., and Percocet 1 p.o. while here in the ED.  He is to apply wet heat followed by ice.  Follow-up with orthopedics.  Return to emergency department if worsening.  He was given a prescription for Sterapred, Flexeril, and tramadol.    Christian Dickerson was evaluated in Emergency Department on 11/19/2019 for the symptoms described in the history of present illness. He was evaluated in the context of the global COVID-19 pandemic, which necessitated consideration that the patient might be at risk for infection with the SARS-CoV-2 virus that causes COVID-19. Institutional protocols  and algorithms that pertain to the evaluation of patients at risk for COVID-19 are in a state of rapid change based on information released by regulatory bodies including the CDC and federal and state organizations. These policies and algorithms were followed during the patient's care in the ED.   As part of my medical decision making, I reviewed the following data within the electronic MEDICAL RECORD NUMBER Nursing notes reviewed and incorporated, Old chart reviewed, Radiograph reviewed , Notes from prior ED visits and [place] Controlled Substance Database  ____________________________________________   FINAL CLINICAL IMPRESSION(S) / ED DIAGNOSES  Final diagnoses:  Acute midline low back pain without sciatica      NEW MEDICATIONS STARTED DURING THIS VISIT:  New Prescriptions   CYCLOBENZAPRINE (FLEXERIL) 10 MG TABLET    Take 1 tablet (10 mg total) by mouth 3 (three) times daily as needed.   PREDNISONE (STERAPRED UNI-PAK 21 TAB) 10 MG (21) TBPK TABLET    Take 6 pills on day one then decrease by 1 pill each day   TRAMADOL (ULTRAM) 50 MG TABLET    Take 1  tablet (50 mg total) by mouth every 6 (six) hours as needed.     Note:  This document was prepared using Dragon voice recognition software and may include unintentional dictation errors.    Faythe Ghee, PA-C 11/19/19 1308    Jene Every, MD 11/19/19 [phone]

## 2019-11-19 NOTE — ED Triage Notes (Signed)
Pt presents to ED via POV, ambulatory to triage at this time. Pt c/o "really bad back pain since Tuesday". Pt visualized in NAD at this time. Pt states pain worse with movement, pt states pain in his lower back.

## 2019-11-19 NOTE — ED Notes (Signed)
Patient reports woke up yesterday with back pain today unable to get in comfortable position. Denies trauma. Patient reports heavy lifting at work.

## 2020-12-23 IMAGING — CR DG CHEST 2V
1 series · 3 of 3 positions shown · non-contrast
Comparison: None.

Addendum:
CLINICAL DATA: Chest pain and cough

EXAM:
CHEST - 2 VIEW

[Series 1: dg chest 2 view · 0.14mm/px · 3 of 3 slices shown]
[im 1/3]
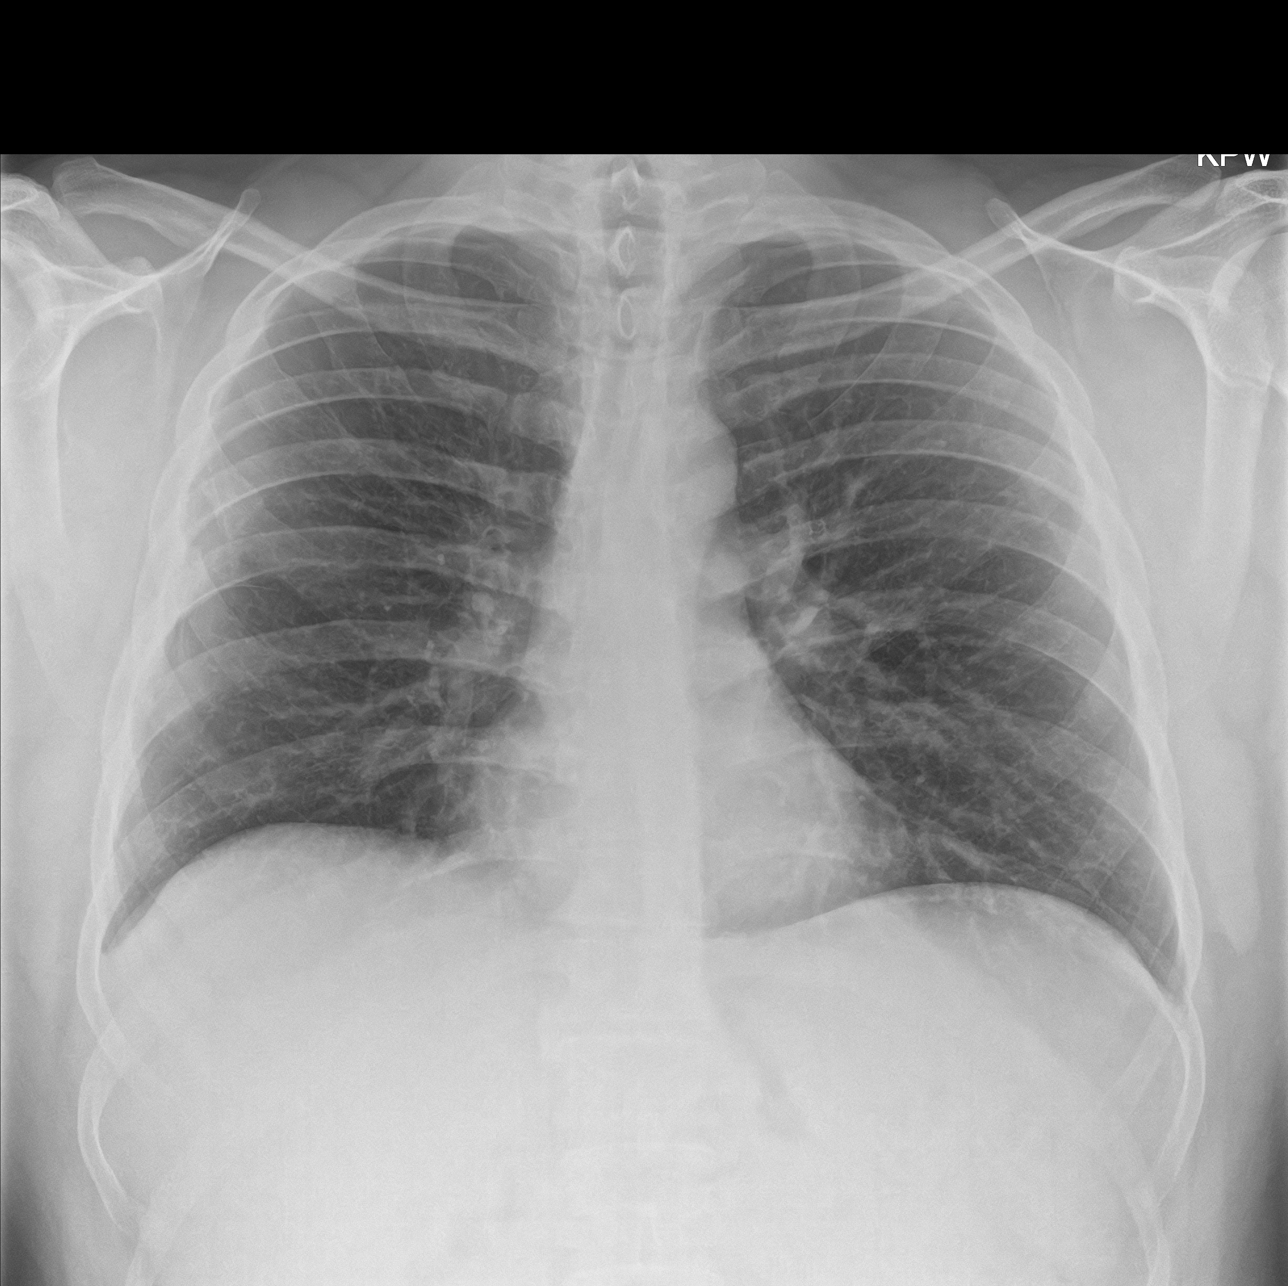
[im 2/3]
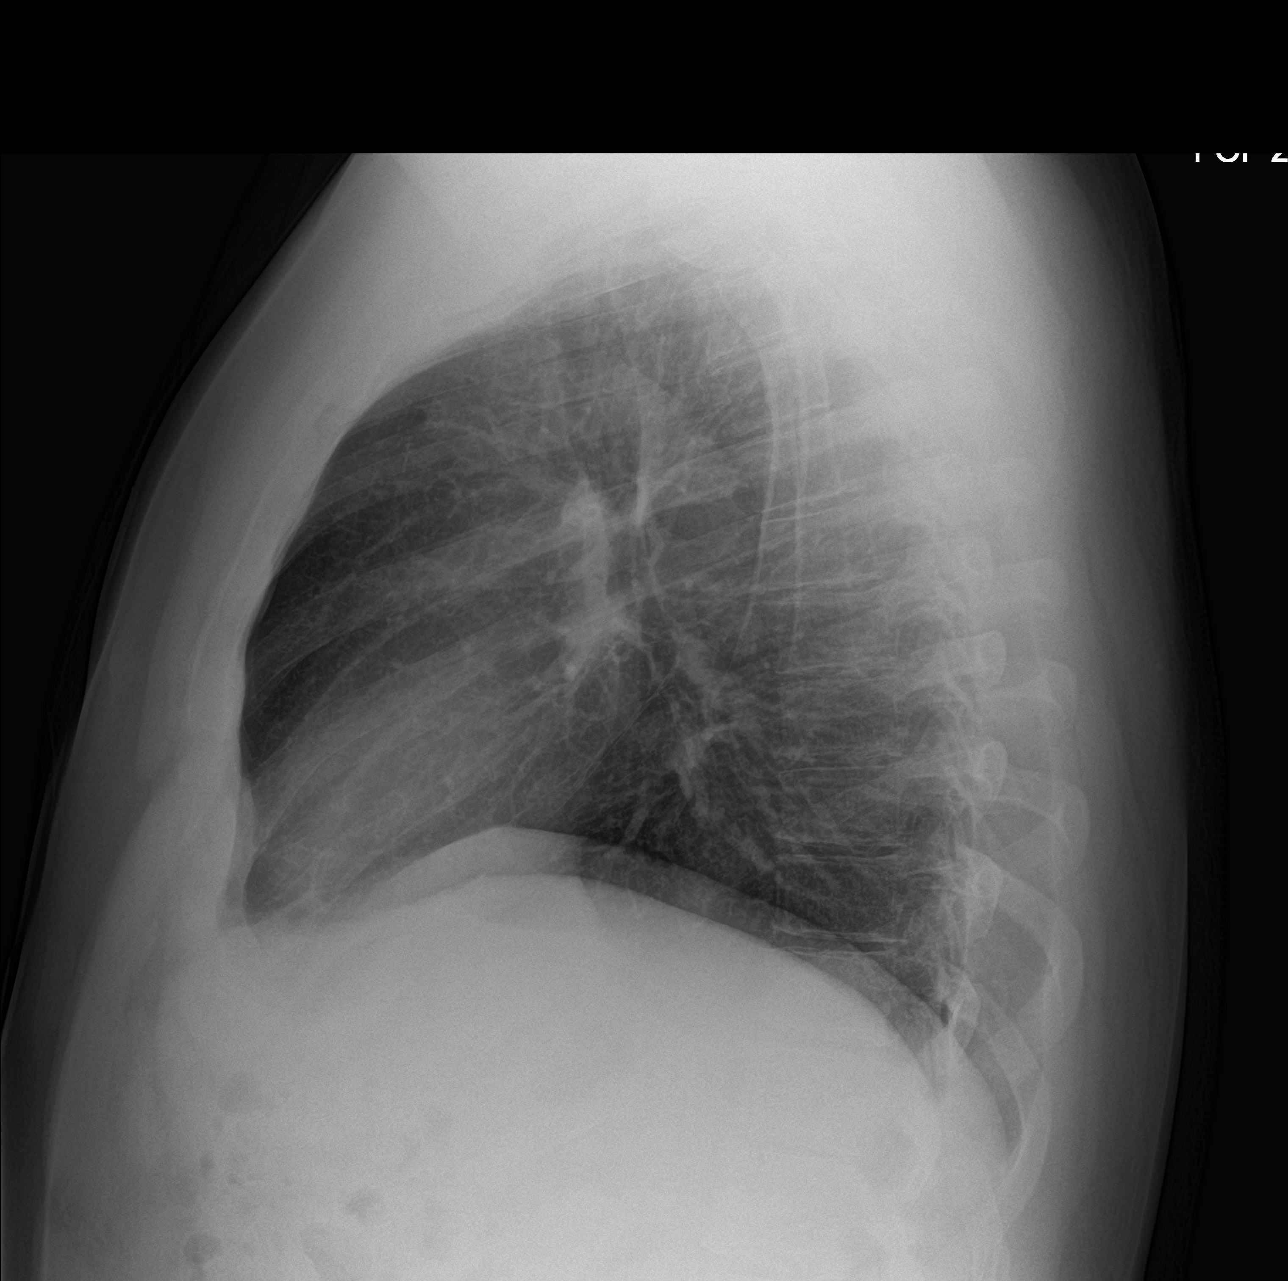
[im 3/3]
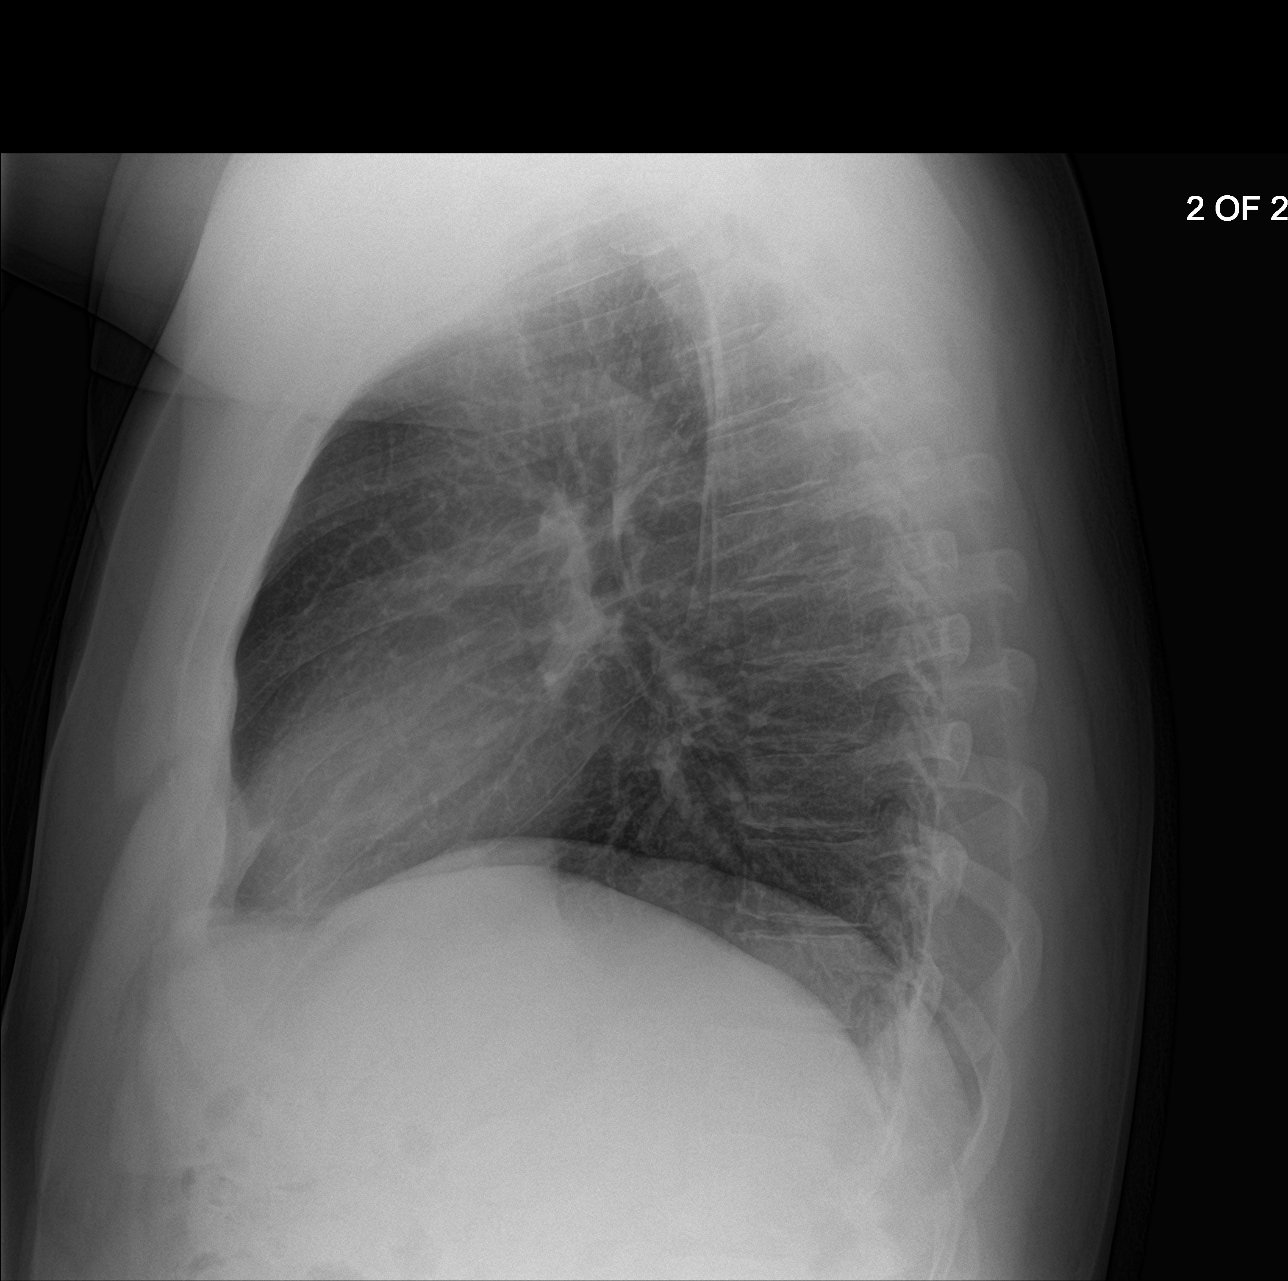

[3 of 3 positions shown; findings below may reference images not displayed]

FINDINGS: There is no edema or consolidation. Heart size and pulmonary
vascularity are normal. No adenopathy. No the thorax. There old
healed rib fractures on the right with remodeling.
IMPRESSION: No edema or consolidation.

ADDENDUM:
Sentence should read: There are old healed rib fractures on the
right with remodeling.

*** End of Addendum ***

## 2021-09-03 IMAGING — CT CT RENAL STONE PROTOCOL
2 of 4 series · 16 of 46 positions shown, 18 images · non-contrast
Comparison: CT angiography of the chest from 08/18/2018 and CT
stone study of 05/17/2015

CLINICAL DATA: Flank pain, stone disease suspected, history of
right flank pain and history of kidney stones.

EXAM:
CT ABDOMEN AND PELVIS WITHOUT CONTRAST
TECHNIQUE: Multidetector CT imaging of the abdomen and pelvis was performed
following the standard protocol without IV contrast.

[Series 2: stone full standard · axial · 0.81mm/px · z∈[-628,-118]mm · 13 of 112 slices shown, 15 images]
[im 5/112  soft-tissue]
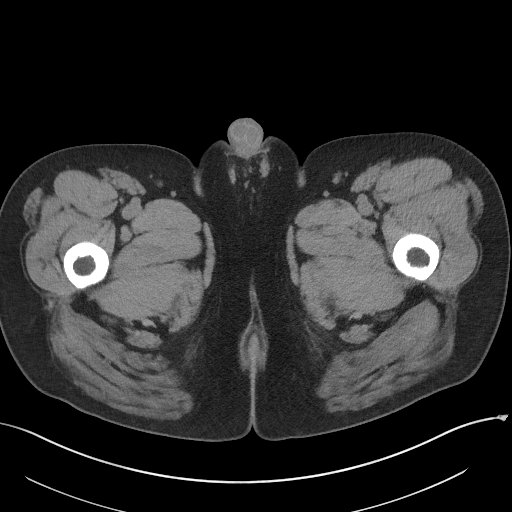
[im 5/112  bone]
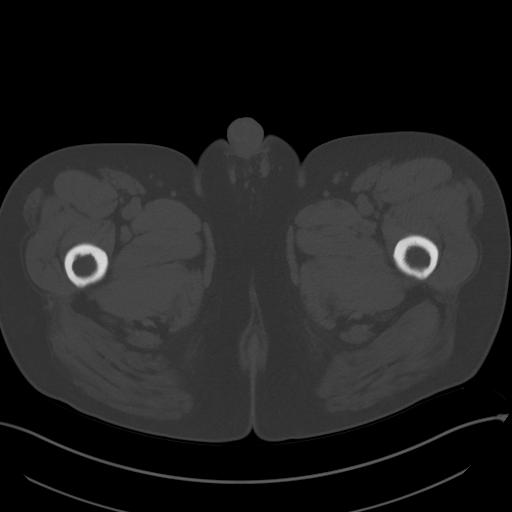
[im 14/112  soft-tissue]
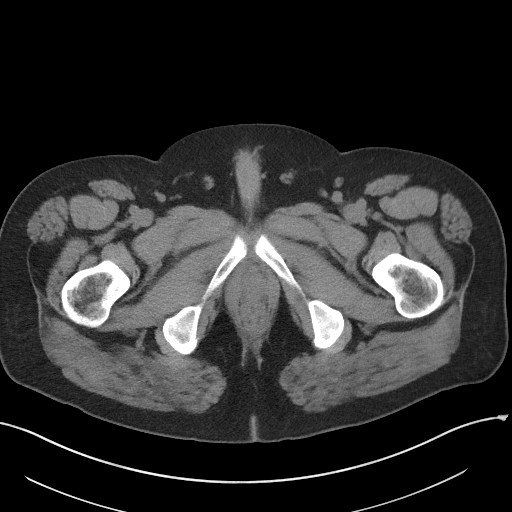
[im 23/112  soft-tissue]
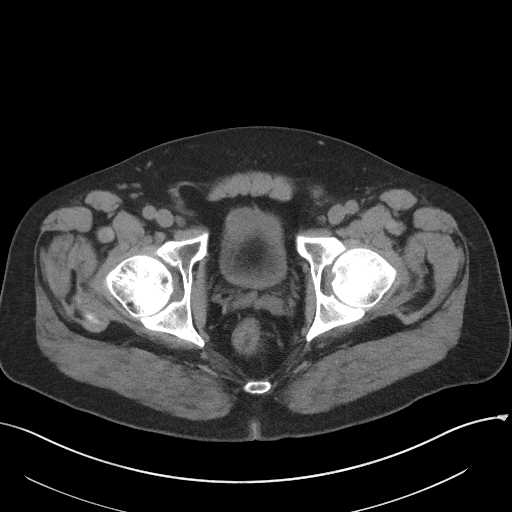
[im 32/112  soft-tissue]
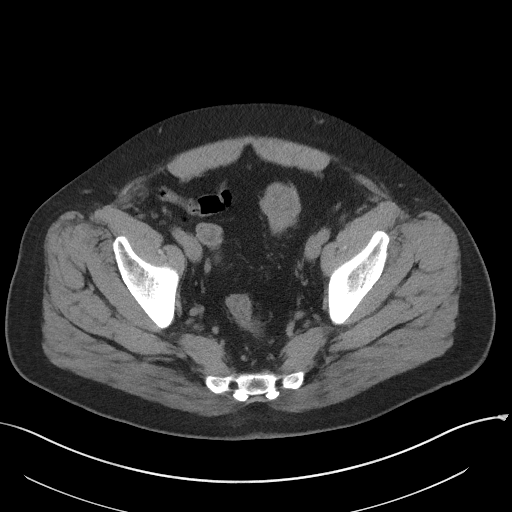
[im 40/112  soft-tissue]
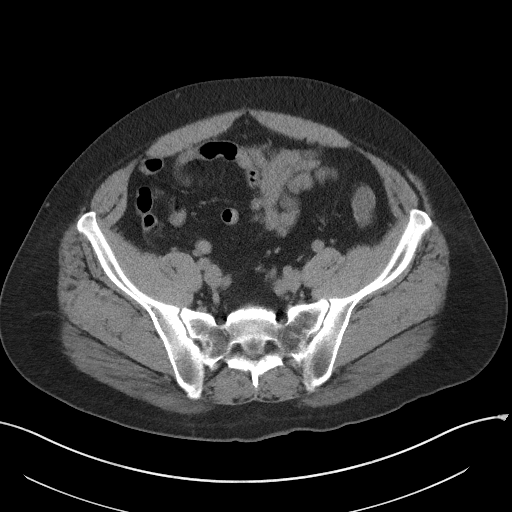
[im 49/112  soft-tissue]
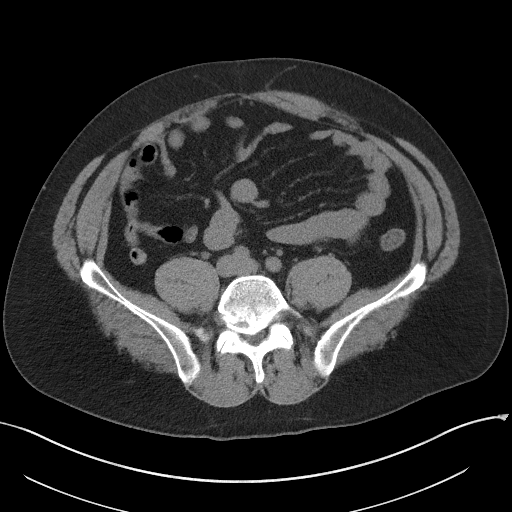
[im 58/112  soft-tissue]
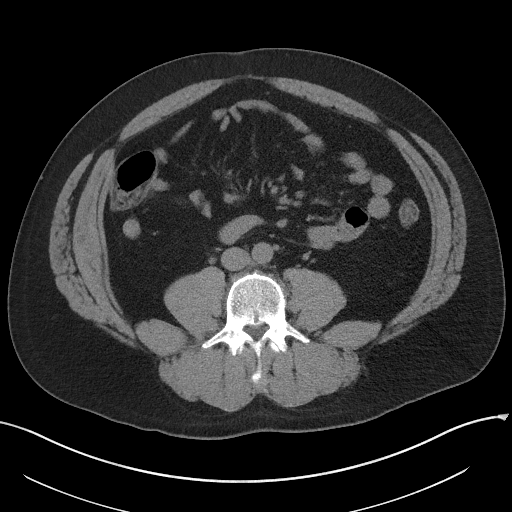
[im 63/112  soft-tissue]
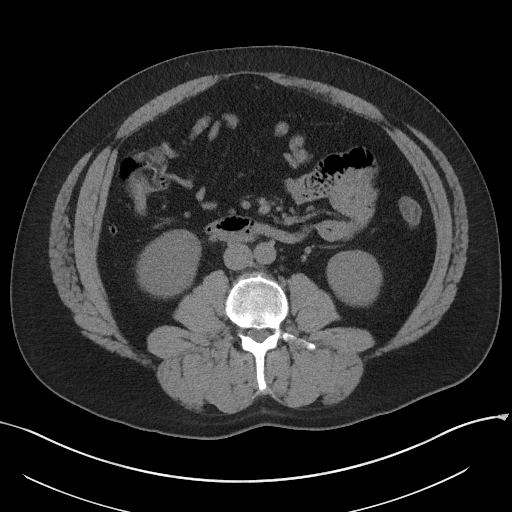
[im 72/112  soft-tissue]
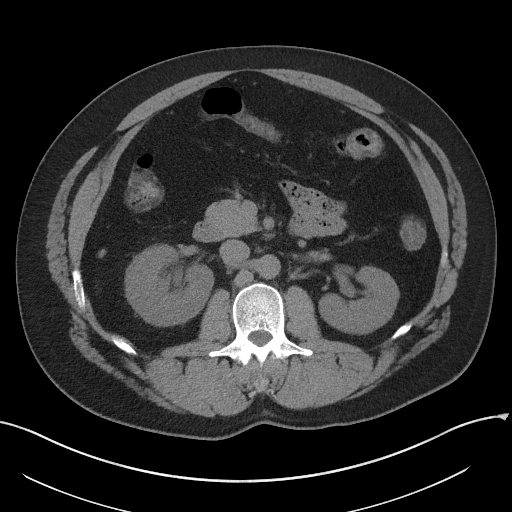
[im 72/112  bone]
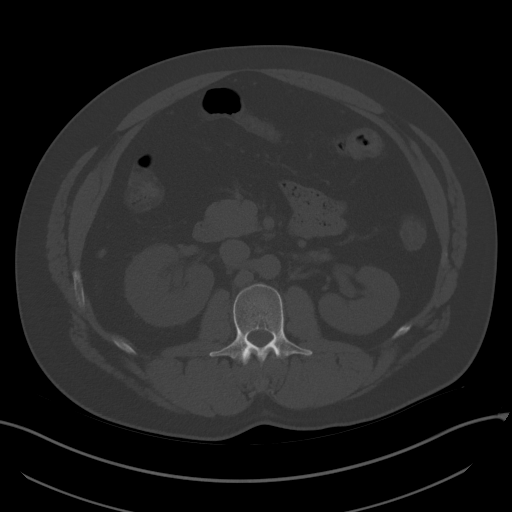
[im 80/112  soft-tissue]
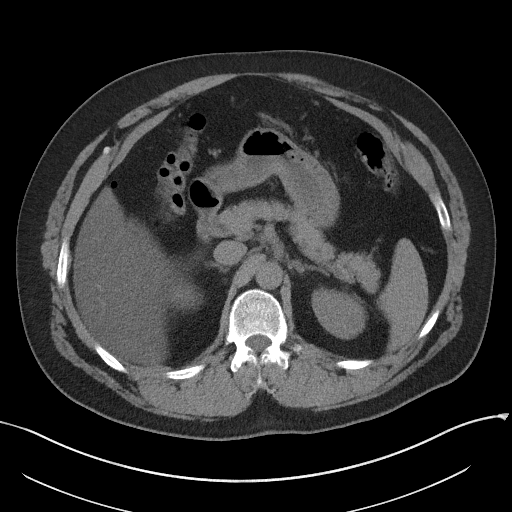
[im 89/112  soft-tissue]
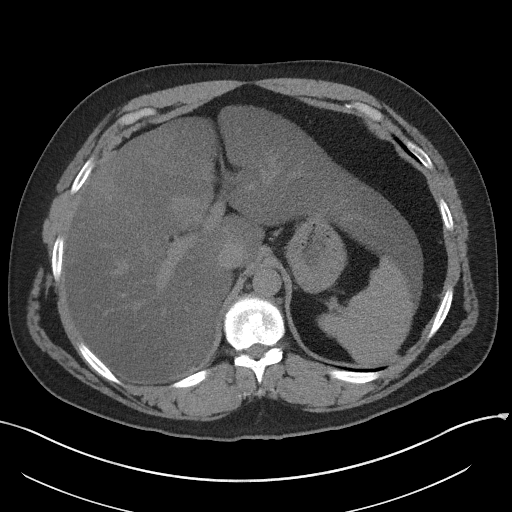
[im 98/112  soft-tissue]
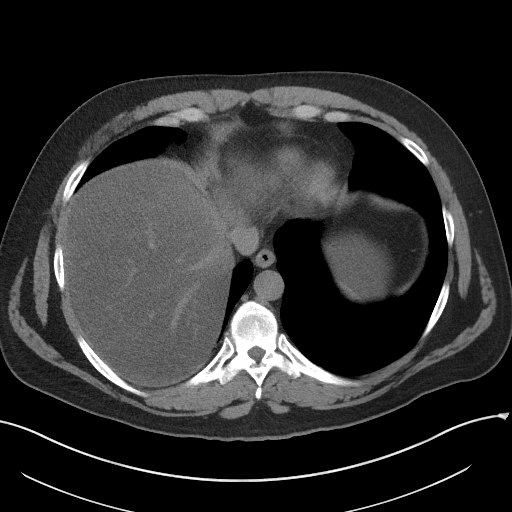
[im 107/112  soft-tissue]
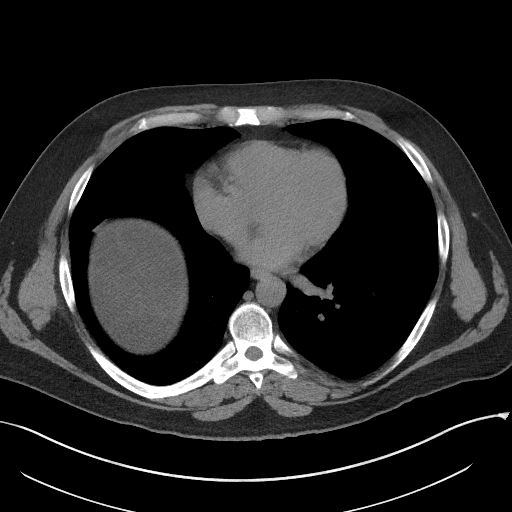

[Series 5: coronal · coronal · 0.80mm/px · 3 of 157 slices shown]
[im 53/157  soft-tissue]
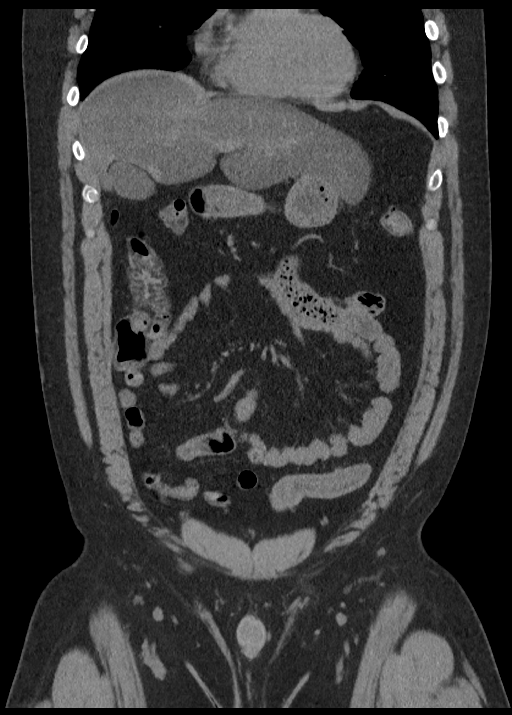
[im 70/157  soft-tissue]
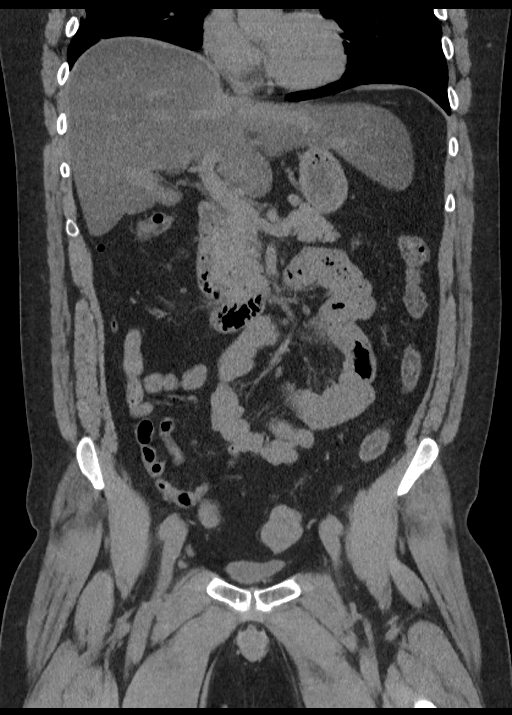
[im 87/157  soft-tissue]
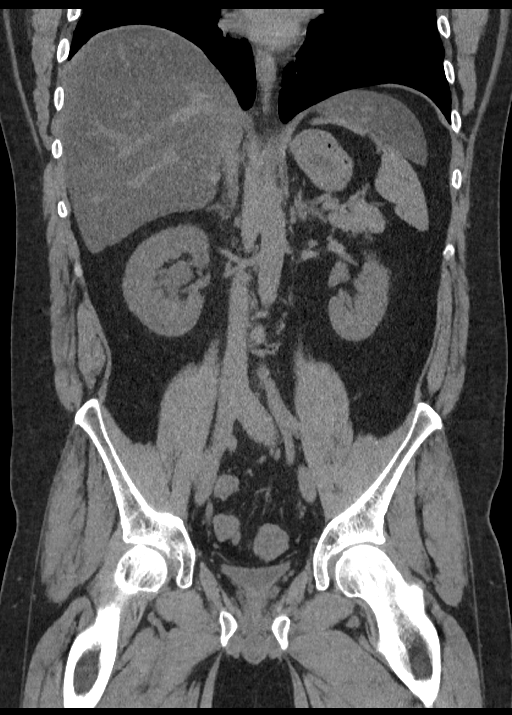

[16 of 46 positions shown; findings below may reference images not displayed]

FINDINGS: Lower chest: No pleural effusion or consolidation.

Hepatobiliary: Severe hepatic steatosis with areas of focal fatty
sparing in the gallbladder fossa. Biliary tree and gallbladder are
unremarkable.

Pancreas: Unremarkable. No pancreatic ductal dilatation or
surrounding inflammatory changes.

Spleen: Normal in size without focal abnormality.

Adrenals/Urinary Tract: Adrenal glands are normal.

Mild fullness of the right ureter and collecting systems are noted
with a tiny distal right ureteral calculus measuring approximately 2
mm, z 1.5-2 cm in no additional calculi are no. Proximal to the
right UVJ

Stomach/Bowel: No signs of acute bowel process. The appendix is
normal.

Vascular/Lymphatic: No signs of atherosclerosis. No retroperitoneal
adenopathy. No upper abdominal adenopathy.

No pelvic adenopathy.

Reproductive: Prostate unremarkable by CT.

Other: No abdominal wall hernia or abnormality. No abdominopelvic
ascites.

Musculoskeletal: No acute bone finding or destructive bone process.
IMPRESSION: 1. There is a 2 mm calculus in the distal right ureter resulting in
very mild hydronephrosis of the right ureter and collecting systems.
2. Severe hepatic steatosis. Correlate with any clinical or
laboratory evidence of liver disease.

## 2024-03-13 ENCOUNTER — Other Ambulatory Visit: Payer: Self-pay

## 2024-03-13 ENCOUNTER — Emergency Department: Payer: Self-pay

## 2024-03-13 ENCOUNTER — Inpatient Hospital Stay
Admission: EM | Admit: 2024-03-13 | Discharge: 2024-03-22 | DRG: 391 | Disposition: A | Discharge status: Home / Self Care | Payer: Self-pay | Attending: Surgery | Admitting: Surgery

## 2024-03-13 DIAGNOSIS — F1020 Alcohol dependence, uncomplicated: Secondary | ICD-10-CM | POA: Diagnosis present

## 2024-03-13 DIAGNOSIS — K5720 Diverticulitis of large intestine with perforation and abscess without bleeding: Secondary | ICD-10-CM | POA: Diagnosis not present

## 2024-03-13 DIAGNOSIS — K7010 Alcoholic hepatitis without ascites: Secondary | ICD-10-CM

## 2024-03-13 DIAGNOSIS — Z5941 Food insecurity: Secondary | ICD-10-CM

## 2024-03-13 DIAGNOSIS — K651 Peritoneal abscess: Secondary | ICD-10-CM | POA: Diagnosis present

## 2024-03-13 DIAGNOSIS — R748 Abnormal levels of other serum enzymes: Secondary | ICD-10-CM | POA: Diagnosis present

## 2024-03-13 DIAGNOSIS — K7011 Alcoholic hepatitis with ascites: Secondary | ICD-10-CM | POA: Diagnosis present

## 2024-03-13 DIAGNOSIS — Z791 Long term (current) use of non-steroidal anti-inflammatories (NSAID): Secondary | ICD-10-CM | POA: Diagnosis not present

## 2024-03-13 DIAGNOSIS — I10 Essential (primary) hypertension: Secondary | ICD-10-CM | POA: Diagnosis present

## 2024-03-13 DIAGNOSIS — Z5900 Homelessness unspecified: Secondary | ICD-10-CM

## 2024-03-13 DIAGNOSIS — Z6832 Body mass index (BMI) 32.0-32.9, adult: Secondary | ICD-10-CM

## 2024-03-13 DIAGNOSIS — E876 Hypokalemia: Secondary | ICD-10-CM | POA: Diagnosis present

## 2024-03-13 DIAGNOSIS — E669 Obesity, unspecified: Secondary | ICD-10-CM | POA: Diagnosis present

## 2024-03-13 DIAGNOSIS — Z609 Problem related to social environment, unspecified: Secondary | ICD-10-CM | POA: Diagnosis present

## 2024-03-13 DIAGNOSIS — F17210 Nicotine dependence, cigarettes, uncomplicated: Secondary | ICD-10-CM | POA: Diagnosis present

## 2024-03-13 DIAGNOSIS — Z79899 Other long term (current) drug therapy: Secondary | ICD-10-CM

## 2024-03-13 LAB — CBC
HCT: 47.6 % (ref 39.0–52.0)
Hemoglobin: 17.1 g/dL — ABNORMAL HIGH (ref 13.0–17.0)
MCH: 35.9 pg — ABNORMAL HIGH (ref 26.0–34.0)
MCHC: 35.9 g/dL (ref 30.0–36.0)
MCV: 100 fL (ref 80.0–100.0)
Platelets: 192 K/uL (ref 150–400)
RBC: 4.76 MIL/uL (ref 4.22–5.81)
RDW: 11.9 % (ref 11.5–15.5)
WBC: 11.7 K/uL — ABNORMAL HIGH (ref 4.0–10.5)
nRBC: 0 % (ref 0.0–0.2)

## 2024-03-13 LAB — COMPREHENSIVE METABOLIC PANEL WITH GFR
ALT: 37 U/L (ref 0–44)
AST: 42 U/L — ABNORMAL HIGH (ref 15–41)
Albumin: 3.8 g/dL (ref 3.5–5.0)
Alkaline Phosphatase: 95 U/L (ref 38–126)
Anion gap: 19 — ABNORMAL HIGH (ref 5–15)
BUN: 13 mg/dL (ref 6–20)
CO2: 19 mmol/L — ABNORMAL LOW (ref 22–32)
Calcium: 9.1 mg/dL (ref 8.9–10.3)
Chloride: 99 mmol/L (ref 98–111)
Creatinine, Ser: 0.95 mg/dL (ref 0.61–1.24)
GFR, Estimated: >60 mL/min (ref >60)
Glucose, Bld: 99 mg/dL (ref 70–99)
Potassium: 3.2 mmol/L — ABNORMAL LOW (ref 3.5–5.1)
Sodium: 137 mmol/L (ref 135–145)
Total Bilirubin: 0.9 mg/dL (ref 0.0–1.2)
Total Protein: 8.2 g/dL — ABNORMAL HIGH (ref 6.5–8.1)

## 2024-03-13 LAB — LACTIC ACID, PLASMA
Lactic Acid, Venous: 1.4 mmol/L (ref 0.5–1.9)
Lactic Acid, Venous: 1.2 mmol/L (ref 0.5–1.9)

## 2024-03-13 LAB — TYPE AND SCREEN
ABO/RH(D): O POS
Antibody Screen: NEGATIVE

## 2024-03-13 LAB — LIPASE, BLOOD: Lipase: 54 U/L — ABNORMAL HIGH (ref 11–51)

## 2024-03-13 MED ORDER — HYDROMORPHONE HCL 1 MG/ML IJ SOLN
1.0000 mg | INTRAMUSCULAR | Status: DC | PRN
  Administered 2024-03-13 – 2024-03-20 (×26): 1 mg via INTRAVENOUS
  Filled 2024-03-13 (×28): qty 1

## 2024-03-13 MED ORDER — LORAZEPAM 2 MG/ML IJ SOLN: 1.0000 mg | INTRAMUSCULAR | Status: AC | PRN

## 2024-03-13 MED ORDER — LORAZEPAM 2 MG/ML IJ SOLN: 0.0000 mg | Freq: Two times a day (BID) | INTRAMUSCULAR | Status: AC

## 2024-03-13 MED ORDER — DIPHENHYDRAMINE HCL 25 MG PO CAPS: 25.0000 mg | ORAL_CAPSULE | Freq: Four times a day (QID) | ORAL | Status: DC | PRN

## 2024-03-13 MED ORDER — LORAZEPAM 1 MG PO TABS
1.0000 mg | ORAL_TABLET | ORAL | Status: AC | PRN
  Administered 2024-03-16: 1 mg via ORAL
  Filled 2024-03-13: qty 1

## 2024-03-13 MED ORDER — NICOTINE 21 MG/24HR TD PT24
21.0000 mg | MEDICATED_PATCH | Freq: Every day | TRANSDERMAL | Status: DC
  Administered 2024-03-13 – 2024-03-19 (×7): 21 mg via TRANSDERMAL
  Filled 2024-03-13 (×7): qty 1

## 2024-03-13 MED ORDER — HEPARIN SODIUM (PORCINE) 5000 UNIT/ML IJ SOLN
5000.0000 [IU] | Freq: Three times a day (TID) | INTRAMUSCULAR | Status: DC
  Administered 2024-03-13 – 2024-03-22 (×25): 5000 [IU] via SUBCUTANEOUS
  Filled 2024-03-13 (×26): qty 1

## 2024-03-13 MED ORDER — ONDANSETRON HCL 4 MG/2ML IJ SOLN
4.0000 mg | Freq: Four times a day (QID) | INTRAMUSCULAR | Status: DC | PRN
Start: 2024-03-13 — End: 2024-03-22

## 2024-03-13 MED ORDER — DIPHENHYDRAMINE HCL 50 MG/ML IJ SOLN: 25.0000 mg | Freq: Four times a day (QID) | INTRAMUSCULAR | Status: DC | PRN

## 2024-03-13 MED ORDER — PIPERACILLIN-TAZOBACTAM 3.375 G IVPB 30 MIN
3.3750 g | Freq: Once | INTRAVENOUS | Status: AC
  Administered 2024-03-13: 3.375 g via INTRAVENOUS
  Filled 2024-03-13 (×2): qty 50

## 2024-03-13 MED ORDER — HYDRALAZINE HCL 20 MG/ML IJ SOLN: 10.0000 mg | INTRAMUSCULAR | Status: DC | PRN

## 2024-03-13 MED ORDER — PANTOPRAZOLE SODIUM 40 MG IV SOLR
40.0000 mg | Freq: Every day | INTRAVENOUS | Status: DC
  Administered 2024-03-13 – 2024-03-21 (×9): 40 mg via INTRAVENOUS
  Filled 2024-03-13 (×9): qty 10

## 2024-03-13 MED ORDER — SODIUM CHLORIDE 0.9 % IV BOLUS
1000.0000 mL | Freq: Once | INTRAVENOUS | Status: AC
  Administered 2024-03-13: 1000 mL via INTRAVENOUS

## 2024-03-13 MED ORDER — LORAZEPAM 2 MG/ML IJ SOLN
0.0000 mg | Freq: Four times a day (QID) | INTRAMUSCULAR | Status: AC
  Administered 2024-03-15: 1 mg via INTRAVENOUS
  Filled 2024-03-13: qty 1

## 2024-03-13 MED ORDER — ONDANSETRON HCL 4 MG/2ML IJ SOLN
4.0000 mg | Freq: Once | INTRAMUSCULAR | Status: AC
  Administered 2024-03-13: 4 mg via INTRAVENOUS
  Filled 2024-03-13: qty 2

## 2024-03-13 MED ORDER — HYDROMORPHONE HCL 1 MG/ML IJ SOLN
0.5000 mg | Freq: Once | INTRAMUSCULAR | Status: AC
  Administered 2024-03-13: 0.5 mg via INTRAVENOUS
  Filled 2024-03-13: qty 0.5

## 2024-03-13 MED ORDER — THIAMINE HCL 100 MG/ML IJ SOLN
100.0000 mg | Freq: Every day | INTRAMUSCULAR | Status: DC
  Filled 2024-03-13 (×3): qty 2

## 2024-03-13 MED ORDER — ACETAMINOPHEN 325 MG PO TABS
650.0000 mg | ORAL_TABLET | Freq: Four times a day (QID) | ORAL | Status: DC | PRN
Start: 2024-03-13 — End: 2024-03-22

## 2024-03-13 MED ORDER — ACETAMINOPHEN 650 MG RE SUPP: 650.0000 mg | Freq: Four times a day (QID) | RECTAL | Status: DC | PRN

## 2024-03-13 MED ORDER — ONDANSETRON 4 MG PO TBDP
4.0000 mg | ORAL_TABLET | Freq: Four times a day (QID) | ORAL | Status: DC | PRN
Start: 2024-03-13 — End: 2024-03-22

## 2024-03-13 MED ORDER — FOLIC ACID 1 MG PO TABS
1.0000 mg | ORAL_TABLET | Freq: Every day | ORAL | Status: DC
  Administered 2024-03-13 – 2024-03-22 (×10): 1 mg via ORAL
  Filled 2024-03-13 (×10): qty 1

## 2024-03-13 MED ORDER — ADULT MULTIVITAMIN W/MINERALS CH
1.0000 | ORAL_TABLET | Freq: Every day | ORAL | Status: DC
  Administered 2024-03-13 – 2024-03-22 (×10): 1 via ORAL
  Filled 2024-03-13 (×10): qty 1

## 2024-03-13 MED ORDER — IOHEXOL 350 MG/ML SOLN
100.0000 mL | Freq: Once | INTRAVENOUS | Status: AC | PRN
  Administered 2024-03-13: 100 mL via INTRAVENOUS

## 2024-03-13 MED ORDER — POTASSIUM CHLORIDE IN NACL 20-0.9 MEQ/L-% IV SOLN
INTRAVENOUS | Status: AC
  Filled 2024-03-13 (×3): qty 1000

## 2024-03-13 MED ORDER — MORPHINE SULFATE (PF) 4 MG/ML IV SOLN
4.0000 mg | Freq: Once | INTRAVENOUS | Status: AC
  Administered 2024-03-13: 4 mg via INTRAVENOUS
  Filled 2024-03-13: qty 1

## 2024-03-13 MED ORDER — THIAMINE MONONITRATE 100 MG PO TABS
100.0000 mg | ORAL_TABLET | Freq: Every day | ORAL | Status: DC
  Administered 2024-03-13 – 2024-03-22 (×10): 100 mg via ORAL
  Filled 2024-03-13 (×10): qty 1

## 2024-03-13 MED ORDER — PIPERACILLIN-TAZOBACTAM 3.375 G IVPB
3.3750 g | Freq: Three times a day (TID) | INTRAVENOUS | Status: DC
  Administered 2024-03-13: 3.375 g via INTRAVENOUS
  Filled 2024-03-13: qty 50

## 2024-03-13 NOTE — ED Triage Notes (Addendum)
 Pt to ED via POV from home. Pt reports lower abd pain since Wednesday. Pt reports Thursday noticed some blood in stool. No blood thinners. Pt also reports after urinating throbbing sensation occurs.   Pt reports use to drink 2 5th's of liquor a day and beginning of August swtiched to just drinking 5-6 beers a day.

## 2024-03-13 NOTE — Consult Note (Signed)
 Initial Consultation Note   Patient: Christian Dickerson FMW:[id number] DOB: 1986-02-15 PCP: Patient, No Pcp Per DOA: 03/13/2024 DOS: the patient was seen and examined on 03/13/2024 Primary service: Lane Shope, MD  Referring physician: Dr. Lane, general surgery Reason for consult: Medical management of alcoholism  Assessment/Plan: Assessment and Plan:  #1 alcohol abuse: Patient is at risk for alcohol withdrawal.  Counseling provided.  He is n.p.o. now.  Initiating CIWA protocol.  Thiamine  and folic acid .  Will continue to follow-up with you  #2 acute diverticulitis with perforation: Patient is NPO.  Antibiotics and conservative measures.  Surgery following.  Plan exploration if patient gets worse or not improved.  Will follow surgical leads.  #3 hypokalemia: Will replete potassium   TRH will continue to follow the patient.  HPI: Christian Dickerson is a 38 y.o. male with past medical history of alcoholism, multiple previous falls with fractures and surgeries, who presented to the ER with abdominal pain that began about 4 days ago.  Pain is in left lower quadrant.  He initially thought it was kidney stone and was trying to get symptoms control.  He took more alcohol with an attempt to wash and out the kidney stone.  Patient then started seeing stool mixed with blood with worsening of pain so decided to come to the ER after his sister came around and ask him to.  The ER patient was seen and evaluated and found to have acute sigmoid diverticulitis with perforation.  Surgery consulted.  Patient admitted to the surgical service and tried has been consulted for management of medical problems especially his risk of alcohol withdrawal.  Review of Systems: As mentioned in the history of present illness. All other systems reviewed and are negative. History reviewed. No pertinent past medical history. Past Surgical History:  Procedure Laterality Date   ANKLE SURGERY     CHEST TUBE INSERTION      LITHOTRIPSY     TIBIA FRACTURE SURGERY     TONSILLECTOMY AND ADENOIDECTOMY     Social History:  reports that he has been smoking cigarettes. He has never used smokeless tobacco. He reports current alcohol use of about 35.0 standard drinks of alcohol per week. He reports that he does not use drugs.  No Known Allergies  History reviewed. No pertinent family history.  Prior to Admission medications   Medication Sig Start Date End Date Taking? Authorizing Provider  albuterol  (PROVENTIL  HFA;VENTOLIN  HFA) 108 (90 Base) MCG/ACT inhaler Inhale 2 puffs into the lungs every 6 (six) hours as needed for wheezing or shortness of breath. 08/18/18   Lang Dover, MD  cyclobenzaprine  (FLEXERIL ) 10 MG tablet Take 1 tablet (10 mg total) by mouth 3 (three) times daily as needed. 11/19/19   Fisher, Devere ORN, PA-C  HYDROcodone -acetaminophen  (NORCO/VICODIN) 5-325 MG tablet Take 1 tablet by mouth every 4 (four) hours as needed for moderate pain. 04/29/19   Arlander Charleston, MD  meloxicam  (MOBIC ) 15 MG tablet Take 1 tablet (15 mg total) by mouth daily. 01/18/19   Cuthriell, Dorn JONETTA, PA-C  methocarbamol  (ROBAXIN ) 500 MG tablet Take 1 tablet (500 mg total) by mouth 4 (four) times daily. 01/18/19   Cuthriell, Dorn JONETTA, PA-C  naproxen  (NAPROSYN ) 500 MG tablet Take 1 tablet (500 mg total) by mouth 2 (two) times daily with a meal. 04/29/19   Arlander Charleston, MD  ondansetron  (ZOFRAN  ODT) 4 MG disintegrating tablet Take 1 tablet (4 mg total) by mouth every 8 (eight) hours as needed. 04/29/19   Kinner,  Lamar, MD  predniSONE  (STERAPRED UNI-PAK 21 TAB) 10 MG (21) TBPK tablet Take 6 pills on day one then decrease by 1 pill each day 11/19/19   Gasper Devere ORN, PA-C  tamsulosin  (FLOMAX ) 0.4 MG CAPS capsule Take 1 capsule (0.4 mg total) by mouth daily. 04/29/19   Arlander Lamar, MD  traMADol  (ULTRAM ) 50 MG tablet Take 1 tablet (50 mg total) by mouth every 6 (six) hours as needed. 11/19/19   Gasper Devere ORN, PA-C    Physical  Exam: Vitals:   03/13/24 1409  BP: (!) 145/86  Pulse: (!) 116  Resp: 18  Temp: 98.1 F (36.7 C)  TempSrc: Oral  SpO2: 96%   Constitutional: Acutely ill looking, tremulous NAD, calm, comfortable Eyes: PERRL, lids and conjunctivae normal ENMT: Mucous membranes are moist. Posterior pharynx clear of any exudate or lesions.Normal dentition.  Neck: normal, supple, no masses, no thyromegaly Respiratory: clear to auscultation bilaterally, no wheezing, no crackles. Normal respiratory effort. No accessory muscle use.  Cardiovascular: Sinus tachycardia, no murmurs / rubs / gallops. No extremity edema. 2+ pedal pulses. No carotid bruits.  Abdomen: Mild distention, diffuse tenderness especially left lower quadrant, no masses palpated. No hepatosplenomegaly. Bowel sounds positive.  Musculoskeletal: Good range of motion, no joint swelling or tenderness, Skin: no rashes, lesions, ulcers. No induration Neurologic: CN 2-12 grossly intact. Sensation intact, DTR normal. Strength 5/5 in all 4.  Psychiatric: Normal judgment and insight. Alert and oriented x 3. Normal mood  Data Reviewed:   Temperature 98.9, blood pressure 140 over 90s, pulse 116, white count 11.7, hemoglobin 17.1, potassium 3.2 CO2 of 19 CT abdomen pelvis GI bleed shows proximal sigmoid colon severe diverticulitis with large amount of gas in the adjacent fat consistent with localized perforation    Family Communication: None at bedside Primary team communication: Discussed case with Dr. Lane Thank you very much for involving us  in the care of your patient.  AuthorBETHA SIM KNOLL, MD 03/13/2024 6:57 PM  For on call review [email or web address].

## 2024-03-13 NOTE — ED Notes (Signed)
 EDP, Ray at bedside.

## 2024-03-13 NOTE — H&P (Addendum)
 Patient ID: Christian Dickerson, male   DOB: [birth year], 38 y.o.   MRN: [id number]  Chief Complaint: Abdominal pain  History of Present Illness Christian Dickerson is a 38 y.o. male with pain beginning approximately 4 days ago.  Patient indicates having left lower quadrant abdominal pain which, he thought initially was a kidney stone.  He reports he is always had problems using the bathroom.  He drank a fair amount of beer with the attempt of washing out the kidney stone, had some bowel activity showing some blood mixed with stool.  The pain was significant and kept him from sleep tossing and turning with recurrences of the pain.  He had hot sweats and chills at home.  And finally his sister from [place]  came, and got him to the hospital.  Past Medical History History reviewed. No pertinent past medical history.    Past Surgical History:  Procedure Laterality Date   ANKLE SURGERY     CHEST TUBE INSERTION     LITHOTRIPSY     TIBIA FRACTURE SURGERY     TONSILLECTOMY AND ADENOIDECTOMY      No Known Allergies  Current Facility-Administered Medications  Medication Dose Route Frequency Provider Last Rate Last Admin   0.9 % NaCl with KCl 20 mEq/ L  infusion   Intravenous Continuous Lane Shope, MD       acetaminophen  (TYLENOL ) tablet 650 mg  650 mg Oral Q6H PRN Lane Shope, MD       Or   acetaminophen  (TYLENOL ) suppository 650 mg  650 mg Rectal Q6H PRN Lane Shope, MD       diphenhydrAMINE  (BENADRYL ) capsule 25 mg  25 mg Oral Q6H PRN Lane Shope, MD       Or   diphenhydrAMINE  (BENADRYL ) injection 25 mg  25 mg Intravenous Q6H PRN Lane Shope, MD       heparin  injection 5,000 Units  5,000 Units Subcutaneous Q8H Lane Shope, MD       hydrALAZINE  (APRESOLINE ) injection 10 mg  10 mg Intravenous Q2H PRN Lane Shope, MD       HYDROmorphone  (DILAUDID ) injection 1 mg  1 mg Intravenous Q2H PRN Lane Shope, MD       nicotine  (NICODERM CQ  - dosed in mg/24 hours) patch 21  mg  21 mg Transdermal Daily Lane Shope, MD       ondansetron  (ZOFRAN -ODT) disintegrating tablet 4 mg  4 mg Oral Q6H PRN Lane Shope, MD       Or   ondansetron  (ZOFRAN ) injection 4 mg  4 mg Intravenous Q6H PRN Lane Shope, MD       pantoprazole  (PROTONIX ) injection 40 mg  40 mg Intravenous QHS Lane Shope, MD       piperacillin -tazobactam (ZOSYN ) IVPB 3.375 g  3.375 g Intravenous Q8H Lane Shope, MD       Current Outpatient Medications  Medication Sig Dispense Refill   albuterol  (PROVENTIL  HFA;VENTOLIN  HFA) 108 (90 Base) MCG/ACT inhaler Inhale 2 puffs into the lungs every 6 (six) hours as needed for wheezing or shortness of breath. 1 Inhaler 2   cyclobenzaprine  (FLEXERIL ) 10 MG tablet Take 1 tablet (10 mg total) by mouth 3 (three) times daily as needed. 30 tablet 0   HYDROcodone -acetaminophen  (NORCO/VICODIN) 5-325 MG tablet Take 1 tablet by mouth every 4 (four) hours as needed for moderate pain. 20 tablet 0   meloxicam  (MOBIC ) 15 MG tablet Take 1 tablet (15 mg total) by mouth daily. 30 tablet 0   methocarbamol  (ROBAXIN ) 500 MG  tablet Take 1 tablet (500 mg total) by mouth 4 (four) times daily. 16 tablet 0   naproxen  (NAPROSYN ) 500 MG tablet Take 1 tablet (500 mg total) by mouth 2 (two) times daily with a meal. 20 tablet 2   ondansetron  (ZOFRAN  ODT) 4 MG disintegrating tablet Take 1 tablet (4 mg total) by mouth every 8 (eight) hours as needed. 20 tablet 0   predniSONE  (STERAPRED UNI-PAK 21 TAB) 10 MG (21) TBPK tablet Take 6 pills on day one then decrease by 1 pill each day 21 tablet 0   tamsulosin  (FLOMAX ) 0.4 MG CAPS capsule Take 1 capsule (0.4 mg total) by mouth daily. 7 capsule 0   traMADol  (ULTRAM ) 50 MG tablet Take 1 tablet (50 mg total) by mouth every 6 (six) hours as needed. 15 tablet 0    Family History History reviewed. No pertinent family history.    Social History Social History   Tobacco Use   Smoking status: Every Day    Current packs/day: 1.00     Types: Cigarettes   Smokeless tobacco: Never  Substance Use Topics   Alcohol use: Yes    Alcohol/week: 35.0 standard drinks of alcohol    Types: 35 Shots of liquor per week    Comment: 1/5 a day   Drug use: No        Review of Systems  All other systems reviewed and are negative.    Physical Exam Blood pressure (!) 145/86, pulse (!) 116, temperature 98.1 F (36.7 C), temperature source Oral, resp. rate 18, SpO2 96%.   CONSTITUTIONAL: Well developed, and nourished, appropriately responsive and aware without distress.   EYES: Sclera non-icteric.  Eyelids tattooed EARS, NOSE, MOUTH AND THROAT:  The oropharynx is clear. Oral mucosa is pink and moist.    Hearing is intact to voice.  NECK: Trachea is midline, and there is no jugular venous distension.  LYMPH NODES:  Lymph nodes in the neck are not appreciated. RESPIRATORY:  Lungs are clear, and breath sounds are equal bilaterally.  Normal respiratory effort without pathologic use of accessory muscles. CARDIOVASCULAR: Heart is regular in rate and rhythm.   Well perfused.  GI: The abdomen is notable for left lower quadrant and suprapubic tenderness, with voluntary guarding.  Otherwise soft, nontender, and nondistended. There were no palpable masses.  I did not appreciate hepatosplenomegaly.  MUSCULOSKELETAL:  Symmetrical muscle tone appreciated in all four extremities.    SKIN: Skin turgor is normal. No pathologic skin lesions appreciated.  Extensive tattooing. NEUROLOGIC:  Motor and sensation appear grossly normal.  Cranial nerves are grossly without defect. PSYCH:  Alert and oriented to person, place and time. Affect is appropriate for situation.  Data Reviewed I have personally reviewed what is currently available of the patient's imaging, recent labs and medical records.   Labs:     Latest Ref Rng & Units 03/13/2024    2:12 PM 04/29/2019    9:48 AM 08/18/2018   10:30 AM  CBC  WBC 4.0 - 10.5 K/uL 11.7  9.5  10.9   Hemoglobin  13.0 - 17.0 g/dL 82.8  83.7  82.9   Hematocrit 39.0 - 52.0 % 47.6  46.5  49.4   Platelets 150 - 400 K/uL 192  198  208       Latest Ref Rng & Units 03/13/2024    2:12 PM 04/29/2019    9:48 AM 08/18/2018   10:30 AM  CMP  Glucose 70 - 99 mg/dL 99  880  891  BUN 6 - 20 mg/dL 13  15  16    Creatinine 0.61 - 1.24 mg/dL 9.04  8.96  9.08   Sodium 135 - 145 mmol/L 137  139  137   Potassium 3.5 - 5.1 mmol/L 3.2  3.9  4.5   Chloride 98 - 111 mmol/L 99  102  104   CO2 22 - 32 mmol/L 19  23  24    Calcium 8.9 - 10.3 mg/dL 9.1  9.6  9.6   Total Protein 6.5 - 8.1 g/dL 8.2  7.8    Total Bilirubin 0.0 - 1.2 mg/dL 0.9  1.1    Alkaline Phos 38 - 126 U/L 95  74    AST 15 - 41 U/L 42  64    ALT 0 - 44 U/L 37  96        Imaging: Radiological images personally reviewed:   Within last 24 hrs: CT ANGIO GI BLEED Result Date: 03/13/2024 CLINICAL DATA:  Rectal bleeding, acute lower abdominal pain. EXAM: CTA ABDOMEN AND PELVIS WITHOUT AND WITH CONTRAST TECHNIQUE: Multidetector CT imaging of the abdomen and pelvis was performed using the standard protocol during bolus administration of intravenous contrast. Multiplanar reconstructed images and MIPs were obtained and reviewed to evaluate the vascular anatomy. RADIATION DOSE REDUCTION: This exam was performed according to the departmental dose-optimization program which includes automated exposure control, adjustment of the mA and/or kV according to patient size and/or use of iterative reconstruction technique. CONTRAST:  OMNIPAQUE  IOHEXOL  350 MG/ML SOLN COMPARISON:  April 29, 2019. FINDINGS: VASCULAR Aorta: Normal caliber aorta without aneurysm, dissection, vasculitis or significant stenosis. Celiac: Patent without evidence of aneurysm, dissection, vasculitis or significant stenosis. SMA: Patent without evidence of aneurysm, dissection, vasculitis or significant stenosis. Renals: Both renal arteries are patent without evidence of aneurysm, dissection,  vasculitis, fibromuscular dysplasia or significant stenosis. IMA: Patent without evidence of aneurysm, dissection, vasculitis or significant stenosis. Inflow: Patent without evidence of aneurysm, dissection, vasculitis or significant stenosis. Proximal Outflow: Bilateral common femoral and visualized portions of the superficial and profunda femoral arteries are patent without evidence of aneurysm, dissection, vasculitis or significant stenosis. Veins: No obvious venous abnormality within the limitations of this arterial phase study. Review of the MIP images confirms the above findings. NON-VASCULAR Lower chest: No acute abnormality. Hepatobiliary: No focal liver abnormality is seen. No gallstones, gallbladder wall thickening, or biliary dilatation. Pancreas: Unremarkable. No pancreatic ductal dilatation or surrounding inflammatory changes. Spleen: Normal in size without focal abnormality. Adrenals/Urinary Tract: Adrenal glands are unremarkable. Kidneys are normal, without renal calculi, focal lesion, or hydronephrosis. Bladder is unremarkable. Stomach/Bowel: Stomach and appendix are unremarkable. There is no evidence of bowel obstruction. Severe proximal sigmoid diverticulitis is noted with a large amount of gas in the adjacent fat consistent with localized perforation. Lymphatic: No adenopathy is noted. Reproductive: Prostate is unremarkable. Other: Small fat containing right inguinal hernia.  No ascites. Musculoskeletal: No acute or significant osseous findings. IMPRESSION: Severe proximal sigmoid diverticulitis is noted with large amount of gas seen in the adjacent fat consistent with localized perforation. Critical Value/emergent results were called by telephone at the time of interpretation on 03/13/2024 at 4:41 pm to provider NEHA RAY , who verbally acknowledged these results. Small fat containing right inguinal hernia. No definite evidence of active gastrointestinal bleeding. Electronically Signed   By: Lynwood Landy Raddle M.D.   On: 03/13/2024 16:42    Assessment     Patient Active Problem List   Diagnosis Date Noted   Diverticulitis  of colon with perforation 03/13/2024   Alcoholism (HCC) 03/13/2024    Plan    Discussed with him in detail, current pathology, possible natural course and progression over the few days he has been sick.  I made him well aware he may require exploratory laparotomy with likely colostomy, but will initiate bowel rest, broad-spectrum antibiotics, serial examinations, IV fluid rehydration, and monitoring via lab work.  He understands that if he should take any turn towards sepsis or septic shock we may proceed to exploratory laparotomy with Hartman's procedure.  We discussed possible evolution of pelvic abscess which may or may not be accessible by percutaneous drainage.  We discussed he would likely be hospitalized for a week or more.    I personally spent a total of 75 minutes in the care of the patient today including preparing to see the patient, getting/reviewing separately obtained history, performing a medically appropriate exam/evaluation, counseling and educating, placing orders, referring and communicating with other health care professionals, documenting clinical information in the EHR, and independently interpreting results.   These notes generated with voice recognition software. I apologize for typographical errors.  Honor Leghorn M.D., FACS 03/13/2024, 6:48 PM

## 2024-03-13 NOTE — Sepsis Progress Note (Signed)
 Sepsis protocol is being followed by [facility].

## 2024-03-13 NOTE — Progress Notes (Signed)
 CODE SEPSIS - PHARMACY COMMUNICATION  **Broad Spectrum Antibiotics should be administered within 1 hour of Sepsis diagnosis**  Time Code Sepsis Called/Page Received: 1650  Antibiotics Ordered: Zosyn   Time of 1st antibiotic administration: 1720  Additional action taken by pharmacy: None  If necessary, Name of Provider/Nurse Contacted: None    Damien Napoleon ,PharmD Clinical Pharmacist  03/13/2024  4:51 PM

## 2024-03-13 NOTE — ED Provider Notes (Signed)
 [facility] Provider Note    Event Date/Time   First MD Initiated Contact with Patient 03/13/24 1418     (approximate)   History   Abdominal Pain   HPI  Christian Dickerson is a 38 year old male presenting to the ER for evaluation of abdominal pain.  On Wednesday, patient had onset of lower abdominal pain worse on the left side.  He reports multiple episodes of nonbloody vomiting.  Also reports frequent [place], has noticed a small amount of bright red blood in the toilet with his stools.  Additionally reports dysuria.  Reports he initially thought he had a kidney stone so was drinking beer to make him pee more as he thought this might make him pass the stone.      Physical Exam   Triage Vital Signs: ED Triage Vitals [03/13/24 1409]  Encounter Vitals Group     BP (!) 145/86     Girls Systolic BP Percentile      Girls Diastolic BP Percentile      Boys Systolic BP Percentile      Boys Diastolic BP Percentile      Pulse Rate (!) 116     Resp 18     Temp 98.1 F (36.7 C)     Temp Source Oral     SpO2 96 %     Weight      Height      Head Circumference      Peak Flow      Pain Score 9     Pain Loc      Pain Education      Exclude from Growth Chart     Most recent vital signs: Vitals:   03/13/24 1409  BP: (!) 145/86  Pulse: (!) 116  Resp: 18  Temp: 98.1 F (36.7 C)  SpO2: 96%     General: Awake, interactive  CV:  Tachycardic, good peripheral perfusion.  Resp:  Unlabored respirations, lungs clear to auscultation Abd:  Nondistended, soft, tender to palpation throughout the lower abdomen with focal tenderness in the left lower quadrant.  No significant stool in rectal vault, no visible blood. Neuro:  Symmetric facial movement, fluid speech   ED Results / Procedures / Treatments   Labs (all labs ordered are listed, but only abnormal results are displayed) Labs Reviewed  LIPASE, BLOOD - Abnormal; Notable for the following components:       Result Value   Lipase 54 (*)    All other components within normal limits  COMPREHENSIVE METABOLIC PANEL WITH GFR - Abnormal; Notable for the following components:   Potassium 3.2 (*)    CO2 19 (*)    Total Protein 8.2 (*)    AST 42 (*)    Anion gap 19 (*)    All other components within normal limits  CBC - Abnormal; Notable for the following components:   WBC 11.7 (*)    Hemoglobin 17.1 (*)    MCH 35.9 (*)    All other components within normal limits  CULTURE, BLOOD (ROUTINE X 2)  CULTURE, BLOOD (ROUTINE X 2)  LACTIC ACID, PLASMA  URINALYSIS, ROUTINE W REFLEX MICROSCOPIC  LACTIC ACID, PLASMA  TYPE AND SCREEN     EKG EKG independently reviewed and interpreted by myself demonstrates:    RADIOLOGY Imaging independently reviewed and interpreted by myself demonstrates:  CTA GI bleed protocol demonstrates severe diverticulitis with localized perforation  Formal Radiology Read:  CT ANGIO GI BLEED Result Date:  03/13/2024 CLINICAL DATA:  Rectal bleeding, acute lower abdominal pain. EXAM: CTA ABDOMEN AND PELVIS WITHOUT AND WITH CONTRAST TECHNIQUE: Multidetector CT imaging of the abdomen and pelvis was performed using the standard protocol during bolus administration of intravenous contrast. Multiplanar reconstructed images and MIPs were obtained and reviewed to evaluate the vascular anatomy. RADIATION DOSE REDUCTION: This exam was performed according to the departmental dose-optimization program which includes automated exposure control, adjustment of the mA and/or kV according to patient size and/or use of iterative reconstruction technique. CONTRAST:  OMNIPAQUE  IOHEXOL  350 MG/ML SOLN COMPARISON:  April 29, 2019. FINDINGS: VASCULAR Aorta: Normal caliber aorta without aneurysm, dissection, vasculitis or significant stenosis. Celiac: Patent without evidence of aneurysm, dissection, vasculitis or significant stenosis. SMA: Patent without evidence of aneurysm, dissection, vasculitis or  significant stenosis. Renals: Both renal arteries are patent without evidence of aneurysm, dissection, vasculitis, fibromuscular dysplasia or significant stenosis. IMA: Patent without evidence of aneurysm, dissection, vasculitis or significant stenosis. Inflow: Patent without evidence of aneurysm, dissection, vasculitis or significant stenosis. Proximal Outflow: Bilateral common femoral and visualized portions of the superficial and profunda femoral arteries are patent without evidence of aneurysm, dissection, vasculitis or significant stenosis. Veins: No obvious venous abnormality within the limitations of this arterial phase study. Review of the MIP images confirms the above findings. NON-VASCULAR Lower chest: No acute abnormality. Hepatobiliary: No focal liver abnormality is seen. No gallstones, gallbladder wall thickening, or biliary dilatation. Pancreas: Unremarkable. No pancreatic ductal dilatation or surrounding inflammatory changes. Spleen: Normal in size without focal abnormality. Adrenals/Urinary Tract: Adrenal glands are unremarkable. Kidneys are normal, without renal calculi, focal lesion, or hydronephrosis. Bladder is unremarkable. Stomach/Bowel: Stomach and appendix are unremarkable. There is no evidence of bowel obstruction. Severe proximal sigmoid diverticulitis is noted with a large amount of gas in the adjacent fat consistent with localized perforation. Lymphatic: No adenopathy is noted. Reproductive: Prostate is unremarkable. Other: Small fat containing right inguinal hernia.  No ascites. Musculoskeletal: No acute or significant osseous findings. IMPRESSION: Severe proximal sigmoid diverticulitis is noted with large amount of gas seen in the adjacent fat consistent with localized perforation. Critical Value/emergent results were called by telephone at the time of interpretation on 03/13/2024 at 4:41 pm to provider Illiana Losurdo , who verbally acknowledged these results. Small fat containing right  inguinal hernia. No definite evidence of active gastrointestinal bleeding. Electronically Signed   By: Lynwood Landy Raddle M.D.   On: 03/13/2024 16:42    PROCEDURES:  Critical Care performed: Yes, see critical care procedure note(s)  CRITICAL CARE Performed by: Nilsa Dade   Total critical care time: 32 minutes  Critical care time was exclusive of separately billable procedures and treating other patients.  Critical care was necessary to treat or prevent imminent or life-threatening deterioration.  Critical care was time spent personally by me on the following activities: development of treatment plan with patient and/or surrogate as well as nursing, discussions with consultants, evaluation of patient's response to treatment, examination of patient, obtaining history from patient or surrogate, ordering and performing treatments and interventions, ordering and review of laboratory studies, ordering and review of radiographic studies, pulse oximetry and re-evaluation of patient's condition.   Procedures   MEDICATIONS ORDERED IN ED: Medications  ondansetron  (ZOFRAN ) injection 4 mg (4 mg Intravenous Given 03/13/24 1540)  sodium chloride  0.9 % bolus 1,000 mL (1,000 mLs Intravenous New Bag/Given 03/13/24 1538)  morphine  (PF) 4 MG/ML injection 4 mg (4 mg Intravenous Given 03/13/24 1541)  iohexol  (OMNIPAQUE ) 350 MG/ML injection 100 mL (  100 mLs Intravenous Contrast Given 03/13/24 1609)  piperacillin -tazobactam (ZOSYN ) IVPB 3.375 g (3.375 g Intravenous New Bag/Given 03/13/24 1720)  HYDROmorphone  (DILAUDID ) injection 0.5 mg (0.5 mg Intravenous Given 03/13/24 1732)     IMPRESSION / MDM / ASSESSMENT AND PLAN / ED COURSE  I reviewed the triage vital signs and the nursing notes.  Differential diagnosis includes, but is not limited to, diverticulitis, colitis, other acute intra-abdominal process, viral illness, IBD  Patient's presentation is most consistent with acute presentation with potential threat to life  or bodily function.  38 year old male presenting to the emergency department for evaluation of abdominal pain associated vomiting and diarrhea.  Tachycardic on presentation.  Labs with leukocytosis with WC of 11.7.  CMP with mild anion gap acidosis.  Lipase minimally elevated.  Does have focal tenderness in left lower quadrant on exam.  Will obtain CT to further evaluate.  I have low suspicion for brisk bleed based on history, but will go ahead and obtain CTA GI bleed protocol given reported rectal bleeding.  Clinical Course as of 03/13/24 8191  [place] Mar 13, 2024  1641 Received call from radiology that patient's CT demonstrated severe diverticulitis with evidence of localized perforation. [NR]  1645 Case discussed with Dr. Lane with general surgery.  He is currently in the OR, but he will plan to evaluate the patient. [NR]  1806 Patient seen at bedside by Dr. Lane.  He will patient for further management. [NR]    Clinical Course User Index [NR] Levander Slate, MD     FINAL CLINICAL IMPRESSION(S) / ED DIAGNOSES   Final diagnoses:  Diverticulitis of colon with perforation     Rx / DC Orders   ED Discharge Orders     None        Note:  This document was prepared using Dragon voice recognition software and may include unintentional dictation errors.   Levander Slate, MD 03/13/24 [phone]

## 2024-03-14 DIAGNOSIS — K7010 Alcoholic hepatitis without ascites: Secondary | ICD-10-CM

## 2024-03-14 DIAGNOSIS — K5720 Diverticulitis of large intestine with perforation and abscess without bleeding: Secondary | ICD-10-CM | POA: Diagnosis not present

## 2024-03-14 DIAGNOSIS — F1020 Alcohol dependence, uncomplicated: Secondary | ICD-10-CM

## 2024-03-14 LAB — BASIC METABOLIC PANEL WITH GFR
Anion gap: 9 (ref 5–15)
BUN: 14 mg/dL (ref 6–20)
CO2: 23 mmol/L (ref 22–32)
Calcium: 8.8 mg/dL — ABNORMAL LOW (ref 8.9–10.3)
Chloride: 103 mmol/L (ref 98–111)
Creatinine, Ser: 0.68 mg/dL (ref 0.61–1.24)
GFR, Estimated: >60 mL/min (ref >60)
Glucose, Bld: 98 mg/dL (ref 70–99)
Potassium: 3.6 mmol/L (ref 3.5–5.1)
Sodium: 135 mmol/L (ref 135–145)

## 2024-03-14 LAB — URINALYSIS, ROUTINE W REFLEX MICROSCOPIC
Bacteria, UA: NONE SEEN
Bilirubin Urine: NEGATIVE
Glucose, UA: NEGATIVE mg/dL
Hgb urine dipstick: NEGATIVE
Ketones, ur: 20 mg/dL — AB
Leukocytes,Ua: NEGATIVE
Nitrite: NEGATIVE
Protein, ur: 30 mg/dL — AB
Specific Gravity, Urine: >1.046 — ABNORMAL HIGH (ref 1.005–1.030)
pH: 5 (ref 5.0–8.0)

## 2024-03-14 LAB — CBC
HCT: 42.4 % (ref 39.0–52.0)
Hemoglobin: 15 g/dL (ref 13.0–17.0)
MCH: 36.3 pg — ABNORMAL HIGH (ref 26.0–34.0)
MCHC: 35.4 g/dL (ref 30.0–36.0)
MCV: 102.7 fL — ABNORMAL HIGH (ref 80.0–100.0)
Platelets: 153 K/uL (ref 150–400)
RBC: 4.13 MIL/uL — ABNORMAL LOW (ref 4.22–5.81)
RDW: 12.2 % (ref 11.5–15.5)
WBC: 9.2 K/uL (ref 4.0–10.5)
nRBC: 0 % (ref 0.0–0.2)

## 2024-03-14 LAB — HIV ANTIBODY (ROUTINE TESTING W REFLEX): HIV Screen 4th Generation wRfx: NONREACTIVE

## 2024-03-14 MED ORDER — OXYCODONE-ACETAMINOPHEN 5-325 MG PO TABS
1.0000 | ORAL_TABLET | Freq: Four times a day (QID) | ORAL | Status: DC | PRN
  Administered 2024-03-14 – 2024-03-22 (×23): 2 via ORAL
  Filled 2024-03-14 (×23): qty 2

## 2024-03-14 MED ORDER — PIPERACILLIN-TAZOBACTAM 3.375 G IVPB
3.3750 g | Freq: Three times a day (TID) | INTRAVENOUS | Status: DC
  Administered 2024-03-14 – 2024-03-19 (×16): 3.375 g via INTRAVENOUS
  Filled 2024-03-14 (×16): qty 50

## 2024-03-14 NOTE — TOC CM/SW Note (Signed)
 Transition of Care [facility] - Inpatient Brief Assessment   Patient Details  Name: Christian Dickerson MRN: [id number] Date of Birth: 02/28/1986  Transition of Care [facility]) CM/SW Contact:    Corean ONEIDA Haddock, RN Phone Number: 03/14/2024, 10:44 AM   Clinical Narrative:   Transition of Care [facility]) Screening Note   Patient Details  Name: Christian Dickerson Date of Birth: Oct 03, 1985   Transition of Care [facility]) CM/SW Contact:    Corean ONEIDA Haddock, RN Phone Number: 03/14/2024, 10:44 AM    Transition of Care Department [facility]) has reviewed patient and no TOC needs have been identified at this time. . If new patient transition needs arise, please place a TOC consult.  - Substance abuse resources added to AVS - per SDOH food resources added to AVS - [facility] information and list of local PCP added to AVS      Transition of Care Asessment: Insurance and Status: Selfpay Patient has primary care physician: No     Prior/Current Home Services: No current home services Social Drivers of Health Review: SDOH reviewed interventions complete Readmission risk has been reviewed: Yes Transition of care needs: no transition of care needs at this time

## 2024-03-14 NOTE — Discharge Instructions (Signed)
 Intensive Outpatient Programs   [facility] [address] [place],  [place], [place] [phone]  [facility] (Inpatient and [phone] (Suboxone and Methadone) [address] [phone]  ADS: Alcohol & Drug [facility] Programs - Intensive Outpatient [address] [place], [place] [address], [place]  [phone]  [facility] (Outpatient, Inpatient, [facility] (Groups and Residental) (insurance only) [phone] [place], [place] [phone]   [facility] (for caregivers and family) [address] [place] [address], [place], [place] [phone]  Residential Treatment Programs  [facility](2 years) Residential: 22 days)[facility] ([facility] [address] [place], [place], [place] [phone] or [phone]  [facility] Treatment [facility] [address] [place], [place], [place] [phone]  [facility] (RTS) [address] [place], [place], [place] [phone] Admissions: 8am-3pm M-F  [facility] Program: Residential Program [phone] Days)             [facility]: [facility]  [facility], [place], [place]  [phone] or [phone] in Hours over the weekend or by referral)  [facility] [address] [place], [place] [address] [phone] (Do virtual or phone assessment, offer transportation within 25 miles, have in patient and Outpatient options)   Mobil Crisis: The[facility] Alternatives:[phone] (for crisis  response 24 hours a day)    Food Resources  Agency Name: [facility] Address: [address] [place], [place] [address] Phone: [phone] Website: www.alamanceservices.org Service(s) Offered: Housing services, self-sufficiency, congregate meal program, weatherization program, Event organiser program, emergency food assistance,  housing counseling, home ownership program, wheels - to work program.  [employer] free for 60 and older at various locations from [employer], Monday-Friday:  [employer], [address] [place], [phone] -[facility], [address] [place] [phone]  -[facility], [address] [place] [phone]  -[address], [address] [place], [phone]  Agency Name: [facility] Address: [phone] [address], [place], [place] [address] Phone: [phone] Website: www.[email or web address] Service(s) Offered: Home delivered hot, frozen, and emergency  meals. Grocery assistance program which matches  volunteers one-on-one with seniors unable to grocery shop  for themselves. Must be 60 years and older; less than 20  hours of in-home aide service, limited or no driving ability;  live alone or with someone with a disability; live in  [place].  Agency Name: [facility]) Address: [address] [place], [place] [address] Phone: [phone] Service(s) Offered: Food is served to shut-ins, homeless, elderly, and low income people in the community every Saturday (11:30 am-12:30 pm) and Sunday (12:30 pm-1:30pm). Volunteers also offer help and encouragement in seeking employment,  and spiritual guidance.  Agency Name: Department of Social Services Address: [address] [place], [place] [address] Phone: [phone] Service(s) Offered: Child support services; child welfare services; food stamps; Medicaid; work  first family assistance; and aid with fuel,  rent, food and medicine.  Agency Name: Dietitian Address: [address] [place], [place] Phone: [phone] Website: [email or web address] Services Offered: Monday 10:00am-12:00, 8:00pm-9:00pm, and Friday 10:00am-12:00.  Agency Name: [employer] [facility] Address: [address] [place], [place] [address] Phone: [phone] Website: www.[email or web address].org Service(s) Offered: Serves weekday meals, open from 11:30  am- 1:00 pm., and 6:30-7:30pm, Monday-Wednesday-Friday distributes food 3:30-6pm, Monday-Wednesday-Friday.  Agency Name: [facility] Address: [address], [place], [place] Phone: [phone] Website: [email or web address] Services Offered: Distributes food the 4th Saturday of the month, starting at 8:00 am  Agency Name: [facility] Address: [phone] [address] [place], [place] [address] Phone: [phone] Website: [email or web address] Service(s) Offered: Bread of life, weekly food pantry. Open Wednesdays from 10:00am-noon.  Agency Name: [facility] [employer] Address: [address] [place], [place], [place] Phone: [phone] Services Offered: Distributes food 9am-1pm, Monday-Thursday. Call for details.  Agency Name: [facility] Address: [address], [place], [place] [address] Phone: [phone] Website: [email or web address] Service(s) Offered: Games developer. Call for assistance.  Agency Name: [facility] Address: [address] [place], [place] [address] Phone: [phone] Service Offered: Emergency Food Pantry. Call for appointment.  Agency Name: [facility] Address: [address], [place], [place] [address] Phone: [phone] Website: [email or web address] Services Offered: Games developer. Call for details  Agency Name: [facility] Address: [address] [place], [place] Phone: [phone] Website: [email or web address] Service(s) Offered: Emergency Food Pantry. Call for details.  Agency Name: Holiday representative Address: [address] [place], [place] [address] Phone: [phone] or [phone] Website: [email or web address] Service(s) Offered: Distribute food 9am-11:30 am, Tuesday-Friday, and 1-3:30pm, Monday-Friday. Food pantry Monday-Friday 1pm-3pm, fresh items, Mon.-Wed.-Fri.  Agency Name: [facility] Address: [address] [place], [place] [address] Phone: [phone] Website: www.[email or web address].org Services Offered: Distribute food Tues and Sats from 9:00am-noon. Closed 1st Saturday of each month. Call for details  Agency Name: [employer] Address: Fayrene Ava [facility] [address] [place] [address] Phone: [phone]  Services Offered: Delivers meals every Thursday  You are encouraged to call the [facility] and arrange an application appointment to be screened for service to get set up with a physician for primary care  You may print the application and take with you to the appointment. At this web address [email or web address] Please Call [facility] for appointment  [phone]  [address] [place], [place] [address]  Office Hours Monday: Closed Tuesday: 9:00am - 4:00pm Wednesday: 9:00am - 4:00pm Thursday: 9:00am - 8:00pm Friday: Closed  Endocrinology 2nd Thursday of the Month: 5:00pm - 8:00pm  Rheumatology/Orthopedic Clinic By appointment only.  Contact for availability.     Services Not Covered Obstetrics Gastrointestinal/Liver Disease   Rent/Utility/Housing  Agency Name: [facility] Agency Address: [address] [place], [place] [address] Phone: [phone] Email: [email or web address] Website: [email or web address] Service(s)  Offered: Housing services, self-sufficiency, congregate meal program, weatherization program, Field seismologist program, emergency food assistance,  housing counseling, home ownership program, wheels -towork program.  Agency Name: [facility] Address: [address] [place], [place] [address] Phone: [phone] (8a-4p) [phone] (8p- 10p) Email: [email or web address] Website: [email or web address] Service(s) Offered: A program for homeless and/or needy men that includes one-on-one counseling, life skills training and job rehabilitation.  Agency Name: [facility] Address: [address], [place], [place] [address] Phone: [phone] Website: www.[email or web address].org Service(s) Offered: Assistance to needy in emergency with utility bills, heating fuel, and prescriptions. Shelter for homeless 7pm-7am. October 30, 2016 15  Agency Name: [facility] [place] (Developmentally Disabled) Address: [address], [place], [place] [address] Phone: [phone] Contact Person: Lemond Cart Email: [email or web address] Website: [email or web address] Service(s) Offered: Helps individuals with developmental disabilities move from housing that is more restrictive to homes where they  can achieve greater independence and have more  opportunities.  Agency Name: [facility] Address: [address], [place], [place] [address] Phone: [phone] Email: [email or web address] Website: [email or web address].org Service(s) Offered: Provides affordable housing for low-income families, elderly, and disabled individuals. Offer a wide range of  programs and services, from financial planning to afterschool and summer programs.  Agency Name: Department of Social Services Address: [address] [place], [place] [address] Phone: [phone] Service(s) Offered: Child support services; child welfare services; food  stamps; Medicaid; work first family assistance; and aid with fuel,  rent, food and medicine.  Agency Name: [facility] [place] [employer]. Address: [facility] [address], [place], [place]  [address] Phone: [phone] Website: www.familyabuseservices.org Service(s) Offered: 24 hour Crisis Line: [phone]; 24 hour Emergency Shelter; Transitional Housing; Support Groups; Scientist, physiological; [employer]; Hispanic Outreach: [phone];  Visitation Center: [phone].  Agency Name: [facility] [place]. Address: [address] [place], [place] [address] Phone: [phone] Service(s) Offered: CAP Services; Home and [employer]; Individual or Group Supports; Respite Care Non-Institutional Nursing;  Residential Supports; Respite Care and Personal Care Services; Transportation; Family and Friends Night; Recreational Activities; Three Nutritious Meals/Snacks; Consultation with Registered Dietician; Twenty-four hour Registered Nurse Access; Daily and [employer] and [employer]s; Camp Green Leaves; [place] for the [employer] (During Summer Months) Bingo Night (Every  Wednesday Night); Special Populations Dance Night  (Every Tuesday Night); Professional Hair Care Services.  Agency Name: [facility] Address: [address], [place], [place] [address] Phone: [phone] Contact Person: [place] Website: [email or web address].Physicist, medical) Offered: Residential treatment facility for women; food and  clothing, educational & employment development and  transportation to work; Counsellor of financial skills;  parenting and family reunification; emotional and spiritual  support; transitional housing for program graduates.  Agency Name: [facility] Address: [address] [place], [place] [address] Phone: [phone] Email: [email or web address] Website: [email or web address].es Service(s) Offered:  Public housing units for elderly, disabled, and low income people; housing choice vouchers for income eligible  applicants; shelter plus care vouchers; and Psychologist, clinical.  Agency Name: [facility] [employer] Address: [address] [place], [place] [address] Phone: [phone] Email: [email or web address] Website: www.habitatalamance.org Service(s) Offered: Build houses for families in need of decent housing. Each adult in the family must invest 200 hours of labor on  someone else's house, work with volunteers to build their own house, attend classes on budgeting, home maintenance, yard care, and attend homeowner association meetings.  Agency Name: [facility] Address: [address] [place], [place] [address] Phone: [phone] Website: www.rsli.org Service(s) Offered: Intermediate care facilities for intellectually delayed, Supervised Living in group homes for adults with developmental disabilities, Supervised Living for people who have dual diagnoses (MRMI), Independent Living, Supported Living, respite and a variety of CAP services, pre-vocational services, day supports, and [employer].  Agency Name: [place] [employer] [facility] Phone: [phone] Website: www.NCForeclosurePrevention.gov Service(s) Offered: Zero-interest, deferred loans to homeowners struggling to pay their mortgage. Call for more information.    Transportation Resources for [employer]  Agency Name: [facility] Address: [address] [place], [place] [address] Phone: [phone] Email: [email or web address] Website: www.[email or web address].org Service(s) Offered: Family Dollar Stores, self-sufficiency, congregate meal program, weatherization program, Field seismologist program, emergency food assistance,  housing counseling, home ownership program, wheels-towork program.  Agency Name:  [facility] [employer] [phone]) Address: [address], [place], [place] [address] Phone: [phone] Website: www.[email or web address].[email or web address] Service(s) Offered: Transportation  for general public, subscription and demand response; Dial-a-Ride for citizens 40 years of age or older.  Agency Name: Department of Social Services Address: [address] [place], [place] [address] Phone: [phone] Service(s) Offered: Child support services; child welfare services; food stamps; Medicaid; work first family assistance; and aid with fuel,  rent, food and medicine, transportation assistance.  Agency Name: Disabled [employer] (DAV) Transportation  Network Phone: [phone] Service(s) Offered: Transports veterans to the [facility] medical center. Call  forty-eight hours in advance and leave the name, telephone  number, date, and time of appointment. Veteran will be  contacted by the driver the day before the appointment to  arrange a pick up point    [employer] ACTA currently provides door to door services. ACTA connects with PART daily for services to [facility]. ACTA also performs contract services to [employer] operates 27 vehicles, all but 3 mini-vans are equipped with lifts for special needs as well as the general public. ACTA drivers are each CDL certified and trained in First Aid and CPR. ACTA was established in 2002 by [employer]. An independent Industrial/product designer. ACTA operates via Cytogeneticist with required local 10% match funding from [employer]. ACTA provides over 80,000 passenger trips each year, including [facility] and [employer] sites.  Call at least by 11 AM one business day prior to needing transportation  [employer].                      [place], [place] [address]     Office Hours: Monday-Friday  8 AM - 5  PM

## 2024-03-14 NOTE — Progress Notes (Signed)
 [facility]  - [facility]   PATIENT NAME: Christian Dickerson    MR#:  [id number]  DATE OF BIRTH:  May 11, 1986  SUBJECTIVE:  family at bedside. Patient seen earlier. Came in with abdominal pain found to have perforated contained diverticul lightest. Patient complains of pain. He is getting IV fluids IV antibiotics. General surgery following.    VITALS:  Blood pressure (!) 141/101, pulse 79, temperature 98.1 F (36.7 C), resp. rate 15, height 6' 1 (1.854 m), weight 113.1 kg, SpO2 98%.  PHYSICAL EXAMINATION:   GENERAL:  38 y.o.-year-old patient with no acute distress. Obese LUNGS: Normal breath sounds bilaterally, no wheezing CARDIOVASCULAR: S1, S2 normal. No murmur   ABDOMEN: Soft, +tender, +distended.  EXTREMITIES: No  edema b/l.    NEUROLOGIC: nonfocal  patient is alert and awake SKIN: tattoos at various areas of the body  LABORATORY PANEL:  CBC Recent Labs  Lab 03/14/24 0329  WBC 9.2  HGB 15.0  HCT 42.4  PLT 153    Chemistries  Recent Labs  Lab 03/13/24 1412 03/14/24 0329  NA 137 135  K 3.2* 3.6  CL 99 103  CO2 19* 23  GLUCOSE 99 98  BUN 13 14  CREATININE 0.95 0.68  CALCIUM 9.1 8.8*  AST 42*  --   ALT 37  --   ALKPHOS 95  --   BILITOT 0.9  --    Cardiac Enzymes No results for input(s): TROPONINI in the last 168 hours. RADIOLOGY:  CT ANGIO GI BLEED Result Date: 03/13/2024 CLINICAL DATA:  Rectal bleeding, acute lower abdominal pain. EXAM: CTA ABDOMEN AND PELVIS WITHOUT AND WITH CONTRAST TECHNIQUE: Multidetector CT imaging of the abdomen and pelvis was performed using the standard protocol during bolus administration of intravenous contrast. Multiplanar reconstructed images and MIPs were obtained and reviewed to evaluate the vascular anatomy. RADIATION DOSE REDUCTION: This exam was performed according to the departmental dose-optimization program which includes automated exposure control, adjustment of the mA and/or kV according to  patient size and/or use of iterative reconstruction technique. CONTRAST:  OMNIPAQUE  IOHEXOL  350 MG/ML SOLN COMPARISON:  April 29, 2019. FINDINGS: VASCULAR Aorta: Normal caliber aorta without aneurysm, dissection, vasculitis or significant stenosis. Celiac: Patent without evidence of aneurysm, dissection, vasculitis or significant stenosis. SMA: Patent without evidence of aneurysm, dissection, vasculitis or significant stenosis. Renals: Both renal arteries are patent without evidence of aneurysm, dissection, vasculitis, fibromuscular dysplasia or significant stenosis. IMA: Patent without evidence of aneurysm, dissection, vasculitis or significant stenosis. Inflow: Patent without evidence of aneurysm, dissection, vasculitis or significant stenosis. Proximal Outflow: Bilateral common femoral and visualized portions of the superficial and profunda femoral arteries are patent without evidence of aneurysm, dissection, vasculitis or significant stenosis. Veins: No obvious venous abnormality within the limitations of this arterial phase study. Review of the MIP images confirms the above findings. NON-VASCULAR Lower chest: No acute abnormality. Hepatobiliary: No focal liver abnormality is seen. No gallstones, gallbladder wall thickening, or biliary dilatation. Pancreas: Unremarkable. No pancreatic ductal dilatation or surrounding inflammatory changes. Spleen: Normal in size without focal abnormality. Adrenals/Urinary Tract: Adrenal glands are unremarkable. Kidneys are normal, without renal calculi, focal lesion, or hydronephrosis. Bladder is unremarkable. Stomach/Bowel: Stomach and appendix are unremarkable. There is no evidence of bowel obstruction. Severe proximal sigmoid diverticulitis is noted with a large amount of gas in the adjacent fat consistent with localized perforation. Lymphatic: No adenopathy is noted. Reproductive: Prostate is unremarkable. Other: Small fat containing right inguinal hernia.  No  ascites. Musculoskeletal: No  acute or significant osseous findings. IMPRESSION: Severe proximal sigmoid diverticulitis is noted with large amount of gas seen in the adjacent fat consistent with localized perforation. Critical Value/emergent results were called by telephone at the time of interpretation on 03/13/2024 at 4:41 pm to provider Christian Dickerson , who verbally acknowledged these results. Small fat containing right inguinal hernia. No definite evidence of active gastrointestinal bleeding. Electronically Signed   By: Lynwood Landy Raddle M.D.   On: 03/13/2024 16:42    Assessment and Plan  Christian Dickerson is a 38 y.o. male with past medical history of alcoholism, multiple previous falls with fractures and surgeries, who presented to the ER with abdominal pain that began about 4 days ago.  Pain is in left lower quadrant.    Chronic alcohol abuse Alcoholic hepatitis --Patient is at risk for alcohol withdrawal.  Counseling provided.  --continue CIWA protocol.   --Thiamine  and folic acid .     Acute diverticulitis with perforation: Patient is NPO.  Antibiotics and conservative measures.  Surgery following.  Plan exploration if patient gets worse or not improved.  Will follow surgical leads.   hypokalemia:  --received K      TOTAL TIME TAKING CARE OF THIS PATIENT: 35 minutes.  >50% time spent on counselling and coordination of care  Note: This dictation was prepared with Dragon dictation along with smaller phrase technology. Any transcriptional errors that result from this process are unintentional.  Leita Blanch M.D    [facility]   CC: Primary care physician; Patient, No Pcp Per

## 2024-03-14 NOTE — Progress Notes (Signed)
 [facility] SURGICAL PROGRESS NOTE (cpt [phone])  Hospital Day(s): 1.   Interval History: Patient seen and examined, no acute events or new complaints overnight. Patient reports he continues to have lower abdominal pain but this is improved from presentation. No fever, chill, nausea, emesis. Previous leukocytosis resolved; WBC 9.2K this morning. Hgb to 15.0. Renal function normal; sCr - 0.68; UO - unmeasured x2. He is NPO this morning. Continues on Zosyn .   Review of Systems:  Constitutional: denies fever, chills  HEENT: denies cough or congestion  Respiratory: denies any shortness of breath  Cardiovascular: denies chest pain or palpitations  Gastrointestinal: + abdominal pain, denied N/V Genitourinary: denies burning with urination or urinary frequency  Vital signs in last 24 hours: [min-max] current  Temp:  [97.9 F (36.6 C)-98.9 F (37.2 C)] 97.9 F (36.6 C) (09/08 0317) Pulse Rate:  [97-116] 99 (09/08 0317) Resp:  [18] 18 (09/08 0317) BP: (133-149)/(86-96) 133/91 (09/08 0317) SpO2:  [94 %-98 %] 98 % (09/08 0317) Weight:  [113.1 kg] 113.1 kg (09/07 2356)     Height: 6' 1 (185.4 cm) Weight: 113.1 kg BMI (Calculated): 32.9   Intake/Output last 2 shifts:  09/07 0701 - 09/08 0700 In: 1204.7 [I.V.:1155.8; IV Piggyback:48.9] Out: -    Physical Exam:  Constitutional: alert, cooperative and no distress  HENT: normocephalic without obvious abnormality  Eyes: PERRL, EOM's grossly intact and symmetric  Respiratory: breathing non-labored at rest  Cardiovascular: regular rate and sinus rhythm  Gastrointestinal: soft, he is tender in the suprapubic region, non-distended, no rebound/guarding. He is not overtly peritonitic Musculoskeletal: no edema or wounds, motor and sensation grossly intact, NT    Labs:     Latest Ref Rng & Units 03/14/2024    3:29 AM 03/13/2024    2:12 PM 04/29/2019    9:48 AM  CBC  WBC 4.0 - 10.5 K/uL 9.2  11.7  9.5   Hemoglobin 13.0 - 17.0 g/dL  84.9  82.8  83.7   Hematocrit 39.0 - 52.0 % 42.4  47.6  46.5   Platelets 150 - 400 K/uL 153  192  198       Latest Ref Rng & Units 03/14/2024    3:29 AM 03/13/2024    2:12 PM 04/29/2019    9:48 AM  CMP  Glucose 70 - 99 mg/dL 98  99  880   BUN 6 - 20 mg/dL 14  13  15    Creatinine 0.61 - 1.24 mg/dL 9.31  9.04  8.96   Sodium 135 - 145 mmol/L 135  137  139   Potassium 3.5 - 5.1 mmol/L 3.6  3.2  3.9   Chloride 98 - 111 mmol/L 103  99  102   CO2 22 - 32 mmol/L 23  19  23    Calcium 8.9 - 10.3 mg/dL 8.8  9.1  9.6   Total Protein 6.5 - 8.1 g/dL  8.2  7.8   Total Bilirubin 0.0 - 1.2 mg/dL  0.9  1.1   Alkaline Phos 38 - 126 U/L  95  74   AST 15 - 41 U/L  42  64   ALT 0 - 44 U/L  37  96     Imaging studies: No new pertinent imaging studies   Assessment/Plan: 38 y.o. male with perforated diverticulitis   - We will continue with conservative measures for now. His abdominal pain is improved, he remains hemodynamically stable, and his leukocytosis is resolved this AM. He understands that if his progress  halts or he deteriorates in any fashion we will need to proceed with surgical intervention and likely colostomy. He wishes to avoid surgery if possible  - Continue NPO; okay for ice chips and sips with medications - Continue IV Abx (Zosyn )   - Continue IVF support  - He may need imaging in 24-48 hours pending progression; I would not be surprised if he ultimately develops abscess at which time we can consider drainage if amenable.   - Monitor abdominal examination; on-going bowel function   - Monitor leukocytosis; normalized  - Pain control prn; antiemetics prn  - Mobilize as tolerated   - Appreciate hospitalist assistance    All of the above findings and recommendations were discussed with the patient, and the medical team, and all of patient's questions were answered to his expressed satisfaction.   -- Arthea Platt, PA-C [facility] 03/14/2024, 7:32 AM M-F: 7am -  4pm

## 2024-03-15 DIAGNOSIS — K5720 Diverticulitis of large intestine with perforation and abscess without bleeding: Secondary | ICD-10-CM | POA: Diagnosis not present

## 2024-03-15 LAB — COMPREHENSIVE METABOLIC PANEL WITH GFR
ALT: 31 U/L (ref 0–44)
AST: 39 U/L (ref 15–41)
Albumin: 3.3 g/dL — ABNORMAL LOW (ref 3.5–5.0)
Alkaline Phosphatase: 80 U/L (ref 38–126)
Anion gap: 11 (ref 5–15)
BUN: 9 mg/dL (ref 6–20)
CO2: 23 mmol/L (ref 22–32)
Calcium: 8.7 mg/dL — ABNORMAL LOW (ref 8.9–10.3)
Chloride: 106 mmol/L (ref 98–111)
Creatinine, Ser: 0.84 mg/dL (ref 0.61–1.24)
GFR, Estimated: >60 mL/min (ref >60)
Glucose, Bld: 100 mg/dL — ABNORMAL HIGH (ref 70–99)
Potassium: 3.6 mmol/L (ref 3.5–5.1)
Sodium: 140 mmol/L (ref 135–145)
Total Bilirubin: 1.6 mg/dL — ABNORMAL HIGH (ref 0.0–1.2)
Total Protein: 7.1 g/dL (ref 6.5–8.1)

## 2024-03-15 LAB — MAGNESIUM: Magnesium: 2 mg/dL (ref 1.7–2.4)

## 2024-03-15 LAB — CBC
HCT: 41.6 % (ref 39.0–52.0)
Hemoglobin: 14.7 g/dL (ref 13.0–17.0)
MCH: 36.3 pg — ABNORMAL HIGH (ref 26.0–34.0)
MCHC: 35.3 g/dL (ref 30.0–36.0)
MCV: 102.7 fL — ABNORMAL HIGH (ref 80.0–100.0)
Platelets: 144 K/uL — ABNORMAL LOW (ref 150–400)
RBC: 4.05 MIL/uL — ABNORMAL LOW (ref 4.22–5.81)
RDW: 12 % (ref 11.5–15.5)
WBC: 9.1 K/uL (ref 4.0–10.5)
nRBC: 0 % (ref 0.0–0.2)

## 2024-03-15 MED ORDER — SODIUM CHLORIDE 0.9 % IV SOLN: INTRAVENOUS | Status: AC

## 2024-03-15 NOTE — Progress Notes (Signed)
 [facility] SURGICAL PROGRESS NOTE (cpt [phone])  Hospital Day(s): 2.   Interval History: Patient seen and examined, no acute events or new complaints overnight. Patient reports he still is having pain, similar to yesterday. Also noted to be diaphoretic and tremulous overnight requiring ativan . No fever, chill, nausea, emesis. WBC remains normal; 9.1K. Hgb to 14.7. Renal function normal; sCr - 0.84; UO - unmeasured. He is NPO this morning. Continues on Zosyn .   Review of Systems:  Constitutional: denies fever, chills  HEENT: denies cough or congestion  Respiratory: denies any shortness of breath  Cardiovascular: denies chest pain or palpitations  Gastrointestinal: + abdominal pain, denied N/V Genitourinary: denies burning with urination or urinary frequency  Vital signs in last 24 hours: [min-max] current  Temp:  [97.5 F (36.4 C)-98.1 F (36.7 C)] 98 F (36.7 C) (09/09 0244) Pulse Rate:  [78-81] 81 (09/09 0244) Resp:  [12-18] 18 (09/09 0244) BP: (124-148)/(89-104) 137/104 (09/09 0244) SpO2:  [95 %-100 %] 95 % (09/09 0244)     Height: 6' 1 (185.4 cm) Weight: 113.1 kg BMI (Calculated): 32.9   Intake/Output last 2 shifts:  09/08 0701 - 09/09 0700 In: 1976.9 [P.O.:240; I.V.:1630.3; IV Piggyback:106.6] Out: -    Physical Exam:  Constitutional: alert, cooperative and no distress  HENT: normocephalic without obvious abnormality  Eyes: PERRL, EOM's grossly intact and symmetric  Respiratory: breathing non-labored at rest  Cardiovascular: regular rate and sinus rhythm  Gastrointestinal: soft, he is tender in the suprapubic region this remains similar to previous examinations, non-distended, no rebound/guarding. He is not overtly peritonitic Musculoskeletal: no edema or wounds, motor and sensation grossly intact, NT    Labs:     Latest Ref Rng & Units 03/15/2024    3:29 AM 03/14/2024    3:29 AM 03/13/2024    2:12 PM  CBC  WBC 4.0 - 10.5 K/uL 9.1  9.2  11.7   Hemoglobin  13.0 - 17.0 g/dL 85.2  84.9  82.8   Hematocrit 39.0 - 52.0 % 41.6  42.4  47.6   Platelets 150 - 400 K/uL 144  153  192       Latest Ref Rng & Units 03/15/2024    3:29 AM 03/14/2024    3:29 AM 03/13/2024    2:12 PM  CMP  Glucose 70 - 99 mg/dL 899  98  99   BUN 6 - 20 mg/dL 9  14  13    Creatinine 0.61 - 1.24 mg/dL 9.15  9.31  9.04   Sodium 135 - 145 mmol/L 140  135  137   Potassium 3.5 - 5.1 mmol/L 3.6  3.6  3.2   Chloride 98 - 111 mmol/L 106  103  99   CO2 22 - 32 mmol/L 23  23  19    Calcium 8.9 - 10.3 mg/dL 8.7  8.8  9.1   Total Protein 6.5 - 8.1 g/dL 7.1   8.2   Total Bilirubin 0.0 - 1.2 mg/dL 1.6   0.9   Alkaline Phos 38 - 126 U/L 80   95   AST 15 - 41 U/L 39   42   ALT 0 - 44 U/L 31   37     Imaging studies: No new pertinent imaging studies   Assessment/Plan: 38 y.o. male with perforated diverticulitis   - We will continue with conservative measures for now. His abdominal pain is improved, he remains hemodynamically stable, and his leukocytosis remains normal this AM. He understands that if his progress  halts or he deteriorates in any fashion we will need to proceed with surgical intervention and likely colostomy. He wishes to avoid surgery if possible  - We will plan to repeat CT Abdomen/Pelvis tomorrow morning to reassess intra-abdominal process. I would not be surprised if he developed abscess given fluid noted on his presenting scan.  - Continue NPO; okay for ice chips and sips with medications - Continue IV Abx (Zosyn )   - Continue IVF support  - Monitor abdominal examination; on-going bowel function   - Monitor leukocytosis; normalized  - CIWA  - Pain control prn; antiemetics prn  - Mobilize as tolerated   - Appreciate hospitalist assistance    All of the above findings and recommendations were discussed with the patient, and the medical team, and all of patient's questions were answered to his expressed satisfaction.  -- Arthea Platt, PA-C [facility] 03/15/2024, 7:13 AM M-F: 7am - 4pm

## 2024-03-15 NOTE — Progress Notes (Signed)
 [facility]  - [place] [facility]   PATIENT NAME: Christian Dickerson    MR#:  [id number]  DATE OF BIRTH:  05-23-1986  SUBJECTIVE:  No family at bedside. Patient seen earlier. Came in with abdominal pain found to have perforated contained diverticul lightest. Patient complains of pain. He is getting IV fluids IV antibiotics. General surgery following.  Patient scoring low on ciwa. Continues with abdominal pain. No fever.    VITALS:  Blood pressure (!) 152/114, pulse 72, temperature 98 F (36.7 C), resp. rate 16, height 6' 1 (1.854 m), weight 113.1 kg, SpO2 99%.  PHYSICAL EXAMINATION:   GENERAL:  38 y.o.-year-old patient with no acute distress. Obese LUNGS: Normal breath sounds bilaterally, no wheezing CARDIOVASCULAR: S1, S2 normal. No murmur   ABDOMEN: Soft, +tender, +distended.  EXTREMITIES: No  edema b/l.    NEUROLOGIC: nonfocal  patient is alert and awake SKIN: tattoos at various areas of the body  LABORATORY PANEL:  CBC Recent Labs  Lab 03/15/24 0329  WBC 9.1  HGB 14.7  HCT 41.6  PLT 144*    Chemistries  Recent Labs  Lab 03/15/24 0329  NA 140  K 3.6  CL 106  CO2 23  GLUCOSE 100*  BUN 9  CREATININE 0.84  CALCIUM 8.7*  MG 2.0  AST 39  ALT 31  ALKPHOS 80  BILITOT 1.6*   Cardiac Enzymes No results for input(s): TROPONINI in the last 168 hours. RADIOLOGY:  CT ANGIO GI BLEED Result Date: 03/13/2024 CLINICAL DATA:  Rectal bleeding, acute lower abdominal pain. EXAM: CTA ABDOMEN AND PELVIS WITHOUT AND WITH CONTRAST TECHNIQUE: Multidetector CT imaging of the abdomen and pelvis was performed using the standard protocol during bolus administration of intravenous contrast. Multiplanar reconstructed images and MIPs were obtained and reviewed to evaluate the vascular anatomy. RADIATION DOSE REDUCTION: This exam was performed according to the departmental dose-optimization program which includes automated exposure control, adjustment of the mA and/or kV  according to patient size and/or use of iterative reconstruction technique. CONTRAST:  OMNIPAQUE  IOHEXOL  350 MG/ML SOLN COMPARISON:  April 29, 2019. FINDINGS: VASCULAR Aorta: Normal caliber aorta without aneurysm, dissection, vasculitis or significant stenosis. Celiac: Patent without evidence of aneurysm, dissection, vasculitis or significant stenosis. SMA: Patent without evidence of aneurysm, dissection, vasculitis or significant stenosis. Renals: Both renal arteries are patent without evidence of aneurysm, dissection, vasculitis, fibromuscular dysplasia or significant stenosis. IMA: Patent without evidence of aneurysm, dissection, vasculitis or significant stenosis. Inflow: Patent without evidence of aneurysm, dissection, vasculitis or significant stenosis. Proximal Outflow: Bilateral common femoral and visualized portions of the superficial and profunda femoral arteries are patent without evidence of aneurysm, dissection, vasculitis or significant stenosis. Veins: No obvious venous abnormality within the limitations of this arterial phase study. Review of the MIP images confirms the above findings. NON-VASCULAR Lower chest: No acute abnormality. Hepatobiliary: No focal liver abnormality is seen. No gallstones, gallbladder wall thickening, or biliary dilatation. Pancreas: Unremarkable. No pancreatic ductal dilatation or surrounding inflammatory changes. Spleen: Normal in size without focal abnormality. Adrenals/Urinary Tract: Adrenal glands are unremarkable. Kidneys are normal, without renal calculi, focal lesion, or hydronephrosis. Bladder is unremarkable. Stomach/Bowel: Stomach and appendix are unremarkable. There is no evidence of bowel obstruction. Severe proximal sigmoid diverticulitis is noted with a large amount of gas in the adjacent fat consistent with localized perforation. Lymphatic: No adenopathy is noted. Reproductive: Prostate is unremarkable. Other: Small fat containing right inguinal  hernia.  No ascites. Musculoskeletal: No acute or significant osseous findings. IMPRESSION:  Severe proximal sigmoid diverticulitis is noted with large amount of gas seen in the adjacent fat consistent with localized perforation. Critical Value/emergent results were called by telephone at the time of interpretation on 03/13/2024 at 4:41 pm to provider NEHA RAY , who verbally acknowledged these results. Small fat containing right inguinal hernia. No definite evidence of active gastrointestinal bleeding. Electronically Signed   By: Lynwood Landy Raddle M.D.   On: 03/13/2024 16:42    Assessment and Plan  SABINO DENNING is a 38 y.o. male with past medical history of alcoholism, multiple previous falls with fractures and surgeries, who presented to the ER with abdominal pain that began about 4 days ago.  Pain is in left lower quadrant.    Chronic alcohol abuse Alcoholic hepatitis --Patient is at risk for alcohol withdrawal.  Counseling provided.  --continue CIWA protocol.   --Thiamine  and folic acid .     Acute diverticulitis with perforation: Patient is NPO.   --Antibiotics and conservative measures.   --Surgery following.   -- Plans for repeat CT abd noted form am   hypokalemia:  --received K      TOTAL TIME TAKING CARE OF THIS PATIENT: 35 minutes.  >50% time spent on counselling and coordination of care  Note: This dictation was prepared with Dragon dictation along with smaller phrase technology. Any transcriptional errors that result from this process are unintentional.  Leita Blanch M.D    [facility]   CC: Primary care physician; Patient, No Pcp Per

## 2024-03-16 ENCOUNTER — Inpatient Hospital Stay: Payer: MEDICAID

## 2024-03-16 DIAGNOSIS — K5720 Diverticulitis of large intestine with perforation and abscess without bleeding: Secondary | ICD-10-CM | POA: Diagnosis not present

## 2024-03-16 LAB — PROTIME-INR
INR: 1.2 (ref 0.8–1.2)
Prothrombin Time: 15.6 s — ABNORMAL HIGH (ref 11.4–15.2)

## 2024-03-16 LAB — CBC
HCT: 43.9 % (ref 39.0–52.0)
Hemoglobin: 15.1 g/dL (ref 13.0–17.0)
MCH: 35.5 pg — ABNORMAL HIGH (ref 26.0–34.0)
MCHC: 34.4 g/dL (ref 30.0–36.0)
MCV: 103.3 fL — ABNORMAL HIGH (ref 80.0–100.0)
Platelets: 171 K/uL (ref 150–400)
RBC: 4.25 MIL/uL (ref 4.22–5.81)
RDW: 12 % (ref 11.5–15.5)
WBC: 9.4 K/uL (ref 4.0–10.5)
nRBC: 0 % (ref 0.0–0.2)

## 2024-03-16 LAB — BASIC METABOLIC PANEL WITH GFR
Anion gap: 13 (ref 5–15)
BUN: 7 mg/dL (ref 6–20)
CO2: 24 mmol/L (ref 22–32)
Calcium: 8.8 mg/dL — ABNORMAL LOW (ref 8.9–10.3)
Chloride: 100 mmol/L (ref 98–111)
Creatinine, Ser: 0.68 mg/dL (ref 0.61–1.24)
GFR, Estimated: >60 mL/min (ref >60)
Glucose, Bld: 92 mg/dL (ref 70–99)
Potassium: 3.7 mmol/L (ref 3.5–5.1)
Sodium: 137 mmol/L (ref 135–145)

## 2024-03-16 MED ORDER — MIDAZOLAM HCL 2 MG/2ML IJ SOLN
INTRAMUSCULAR | Status: AC | PRN
  Administered 2024-03-16: 2 mg via INTRAVENOUS

## 2024-03-16 MED ORDER — SODIUM CHLORIDE 0.9% FLUSH
5.0000 mL | Freq: Three times a day (TID) | INTRAVENOUS | Status: DC
  Administered 2024-03-16 – 2024-03-22 (×15): 5 mL

## 2024-03-16 MED ORDER — IOHEXOL 300 MG/ML  SOLN
100.0000 mL | Freq: Once | INTRAMUSCULAR | Status: AC | PRN
  Administered 2024-03-16: 100 mL via INTRAVENOUS

## 2024-03-16 MED ORDER — MIDAZOLAM HCL 2 MG/2ML IJ SOLN
INTRAMUSCULAR | Status: AC
  Filled 2024-03-16: qty 2

## 2024-03-16 MED ORDER — FENTANYL CITRATE (PF) 100 MCG/2ML IJ SOLN
INTRAMUSCULAR | Status: AC | PRN
  Administered 2024-03-16 (×2): 50 ug via INTRAVENOUS

## 2024-03-16 MED ORDER — FENTANYL CITRATE (PF) 100 MCG/2ML IJ SOLN
INTRAMUSCULAR | Status: AC
  Filled 2024-03-16: qty 2

## 2024-03-16 MED ORDER — LIDOCAINE HCL (PF) 1 % IJ SOLN
10.0000 mL | Freq: Once | INTRAMUSCULAR | Status: AC
  Administered 2024-03-16: 10 mL via INTRADERMAL
  Filled 2024-03-16: qty 10

## 2024-03-16 MED ORDER — IOHEXOL 9 MG/ML PO SOLN
500.0000 mL | ORAL | Status: AC
Start: 2024-03-16 — End: 2024-03-16
  Administered 2024-03-16 (×2): 500 mL via ORAL

## 2024-03-16 NOTE — Progress Notes (Signed)
 Progress Note    Christian Dickerson  FMW:[id number] DOB: December 17, 1985  DOA: 03/13/2024 PCP: Patient, No Pcp Per      Brief Narrative:    Medical records reviewed and are as summarized below:  Christian Dickerson is a 38 y.o. male with medical history significant for alcohol use disorder, tobacco use disorder, previous falls with fractures, ankle surgery, chest tube insertion, tibia fracture surgery, tonsillectomy and adenoidectomy, who presented to the hospital because of abdominal pain of about 4 days duration.    He was found to have acute diverticulitis with perforation.  He was admitted to the surgical service and the hospitalist team was consulted to assist with management of alcohol use disorder with concern for alcohol withdrawal syndrome.        Assessment/Plan:   Principal Problem:   Diverticulitis of colon with perforation Active Problems:   Alcoholism (HCC)   Alcoholic hepatitis    Body mass index is 32.9 kg/m.    Acute diverticulitis with perforation: Repeat CT abdomen and pelvis on 03/16/2024 showed severe proximal sigmoid diverticulitis and multiple fluid collections suggesting developing abscesses. He is still NPO.  He is on IV fluids for hydration and analgesics for pain.  He is also on IV Zosyn  for diverticulitis.  Follow-up with general surgeon for further recommendations.   Hypokalemia: Improved   Elevated blood pressure: No known history of hypertension.  Elevated BP may be due to acute illness/pain.  Monitor BP closely.  No scheduled antihypertensives for now.   Mildly elevated liver enzymes: Improved.  Likely due to alcohol use disorder.   Alcohol use disorder: Use IV Ativan  as needed per CIWA protocol.   Tobacco use disorder: Continue nicotine  patch     Diet Order             Diet NPO time specified Except for: Ice Chips, Sips with Meds  Diet effective now                                  Consultants: General  Surgeon  Procedures: None    Medications:    folic acid   1 mg Oral Daily   heparin   5,000 Units Subcutaneous Q8H   LORazepam   0-4 mg Intravenous Q12H   multivitamin with minerals  1 tablet Oral Daily   nicotine   21 mg Transdermal Daily   pantoprazole  (PROTONIX ) IV  40 mg Intravenous QHS   thiamine   100 mg Oral Daily   Or   thiamine   100 mg Intravenous Daily   Continuous Infusions:  sodium chloride  100 mL/hr at 03/16/24 0320   piperacillin -tazobactam (ZOSYN )  IV 3.375 g (03/16/24 0602)     Anti-infectives (From admission, onward)    Start     Dose/Rate Route Frequency Ordered Stop   03/14/24 0600  piperacillin -tazobactam (ZOSYN ) IVPB 3.375 g        3.375 g 12.5 mL/hr over 240 Minutes Intravenous Every 8 hours 03/14/24 0228 03/21/24 0559   03/13/24 1845  piperacillin -tazobactam (ZOSYN ) IVPB 3.375 g  Status:  Discontinued        3.375 g 12.5 mL/hr over 240 Minutes Intravenous Every 8 hours 03/13/24 1839 03/14/24 0228   03/13/24 1700  piperacillin -tazobactam (ZOSYN ) IVPB 3.375 g        3.375 g 100 mL/hr over 30 Minutes Intravenous  Once 03/13/24 1649 03/13/24 1750  Family Communication/Anticipated D/C date and plan/Code Status   DVT prophylaxis: heparin  injection 5,000 Units Start: 03/13/24 2200 SCDs Start: 03/13/24 1835     Code Status: Full Code  Family Communication: None Disposition Plan: Plan to discharge home   Status is: Inpatient Remains inpatient appropriate because: Severe diverticulitis       Subjective:   Interval events noted.  Plaint of abdominal pain.  He said he got pain medicine this morning and he is waiting for it to kick in.  No complaints.  Objective:    Vitals:   03/15/24 1503 03/15/24 2057 03/16/24 0433 03/16/24 0821  BP: (!) 142/108 (!) 137/99 (!) 140/97 (!) 145/115  Pulse: 78 78 69 74  Resp: 16 16 16 16   Temp: 98.4 F (36.9 C) 99.6 F (37.6 C) 97.6 F (36.4 C) 98.5 F (36.9 C)  TempSrc: Oral Oral  Oral Oral  SpO2: 96% 96% 96% 96%  Weight:      Height:       No data found.   Intake/Output Summary (Last 24 hours) at 03/16/2024 1348 Last data filed at 03/16/2024 0900 Gross per 24 hour  Intake 2255.07 ml  Output --  Net 2255.07 ml   Filed Weights   03/13/24 2356  Weight: 113.1 kg    Exam:  GEN: NAD SKIN: Warm and dry EYES: No pallor or icterus ENT: MMM CV: RRR PULM: CTA B ABD: soft, ND, NT, +BS CNS: AAO x 3, non focal EXT: No edema or tenderness        Data Reviewed:   I have personally reviewed following labs and imaging studies:  Labs: Labs show the following:   Basic Metabolic Panel: Recent Labs  Lab 03/13/24 1412 03/14/24 0329 03/15/24 0329 03/16/24 0734  NA 137 135 140 137  K 3.2* 3.6 3.6 3.7  CL 99 103 106 100  CO2 19* 23 23 24   GLUCOSE 99 98 100* 92  BUN 13 14 9 7   CREATININE 0.95 0.68 0.84 0.68  CALCIUM 9.1 8.8* 8.7* 8.8*  MG  --   --  2.0  --    GFR Estimated Creatinine Clearance: 165 mL/min (by C-G formula based on SCr of 0.68 mg/dL). Liver Function Tests: Recent Labs  Lab 03/13/24 1412 03/15/24 0329  AST 42* 39  ALT 37 31  ALKPHOS 95 80  BILITOT 0.9 1.6*  PROT 8.2* 7.1  ALBUMIN 3.8 3.3*   Recent Labs  Lab 03/13/24 1412  LIPASE 54*   No results for input(s): AMMONIA in the last 168 hours. Coagulation profile Recent Labs  Lab 03/16/24 1223  INR 1.2    CBC: Recent Labs  Lab 03/13/24 1412 03/14/24 0329 03/15/24 0329 03/16/24 0734  WBC 11.7* 9.2 9.1 9.4  HGB 17.1* 15.0 14.7 15.1  HCT 47.6 42.4 41.6 43.9  MCV 100.0 102.7* 102.7* 103.3*  PLT 192 153 144* 171   Cardiac Enzymes: No results for input(s): CKTOTAL, CKMB, CKMBINDEX, TROPONINI in the last 168 hours. BNP (last 3 results) No results for input(s): PROBNP in the last 8760 hours. CBG: No results for input(s): GLUCAP in the last 168 hours. D-Dimer: No results for input(s): DDIMER in the last 72 hours. Hgb A1c: No results for  input(s): HGBA1C in the last 72 hours. Lipid Profile: No results for input(s): CHOL, HDL, LDLCALC, TRIG, CHOLHDL, LDLDIRECT in the last 72 hours. Thyroid function studies: No results for input(s): TSH, T4TOTAL, T3FREE, THYROIDAB in the last 72 hours.  Invalid input(s): FREET3 Anemia work up: No results  for input(s): VITAMINB12, FOLATE, FERRITIN, TIBC, IRON, RETICCTPCT in the last 72 hours. Sepsis Labs: Recent Labs  Lab 03/13/24 1412 03/13/24 1701 03/13/24 2002 03/14/24 0329 03/15/24 0329 03/16/24 0734  WBC 11.7*  --   --  9.2 9.1 9.4  LATICACIDVEN  --  1.4 1.2  --   --   --     Microbiology Recent Results (from the past 240 hours)  Blood Culture (routine x 2)     Status: None (Preliminary result)   Collection Time: 03/13/24  5:01 PM   Specimen: BLOOD LEFT HAND  Result Value Ref Range Status   Specimen Description BLOOD LEFT HAND  Final   Special Requests   Final    BOTTLES DRAWN AEROBIC AND ANAEROBIC Blood Culture results may not be optimal due to an inadequate volume of blood received in culture bottles   Culture   Final    NO GROWTH 3 DAYS Performed at [facility], [address], [place], [place] [address]    Report Status PENDING  Incomplete  Blood Culture (routine x 2)     Status: None (Preliminary result)   Collection Time: 03/13/24  5:01 PM   Specimen: BLOOD  Result Value Ref Range Status   Specimen Description BLOOD RIGHT ANTECUBITAL  Final   Special Requests   Final    BOTTLES DRAWN AEROBIC AND ANAEROBIC Blood Culture results may not be optimal due to an inadequate volume of blood received in culture bottles   Culture   Final    NO GROWTH 3 DAYS Performed at [facility], [address], [place], [place] [address]    Report Status PENDING  Incomplete    Procedures and diagnostic studies:  CT ABDOMEN PELVIS W CONTRAST Result Date: 03/16/2024 CLINICAL DATA:  Sigmoid diverticulitis. EXAM: CT ABDOMEN  AND PELVIS WITH CONTRAST TECHNIQUE: Multidetector CT imaging of the abdomen and pelvis was performed using the standard protocol following bolus administration of intravenous contrast. RADIATION DOSE REDUCTION: This exam was performed according to the departmental dose-optimization program which includes automated exposure control, adjustment of the mA and/or kV according to patient size and/or use of iterative reconstruction technique. CONTRAST:  OMNIPAQUE  IOHEXOL  300 MG/ML  SOLN COMPARISON:  March 13, 2024. FINDINGS: Lower chest: No acute abnormality. Hepatobiliary: No focal liver abnormality is seen. No gallstones, gallbladder wall thickening, or biliary dilatation. Pancreas: Unremarkable. No pancreatic ductal dilatation or surrounding inflammatory changes. Spleen: Normal in size without focal abnormality. Adrenals/Urinary Tract: Adrenal glands and kidneys appear normal. Urinary bladder is decompressed Stomach/Bowel: The stomach and appendix are unremarkable. There is no evidence of bowel obstruction. There remains severe diverticulitis involving proximal sigmoid colon. There are now noted multiple fluid collections in the pericolonic fat superiorly to the inflamed segment of colon with small air-fluid levels suggesting developing abscesses. The largest measures 4.5 x 3.1 cm. Vascular/Lymphatic: No significant vascular findings are present. No enlarged abdominal or pelvic lymph nodes. Reproductive: Prostate is unremarkable. Other: Small fat containing right inguinal hernia is noted. Musculoskeletal: No acute or significant osseous findings. IMPRESSION: There remains severe diverticulitis involving proximal sigmoid colon. There are now noted multiple fluid collections in the pericolonic fat superior to the inflamed segment of colon with small air-fluid levels suggesting developing abscesses. The largest measures 4.5 x 3.1 cm. Electronically Signed   By: Lynwood Landy Raddle M.D.   On: 03/16/2024 10:59                LOS: 3 days   Perle Brickhouse  [facility] [employer] loss adjuster on [email or web address]. If 7PM-7AM, please contact night-coverage at [email or web address]     03/16/2024, 1:48 PM

## 2024-03-16 NOTE — Progress Notes (Signed)
 [facility] SURGICAL PROGRESS NOTE (cpt [phone])  Hospital Day(s): 3.   Interval History: Patient seen and examined, no acute events or new complaints overnight. Patient reports he is feeling better. Abdominal pain is improving. No fever, chills, nausea, emesis. He remains without leukocytosis; WBC 9.4K. Hgb to 15.1. Renal function normal; sCr - 0.68; UO - unmeasured. No electrolyte derangements.   Review of Systems:  Constitutional: denies fever, chills  HEENT: denies cough or congestion  Respiratory: denies any shortness of breath  Cardiovascular: denies chest pain or palpitations  Gastrointestinal: + abdominal pain (improving), denied N/V Genitourinary: denies burning with urination or urinary frequency  Vital signs in last 24 hours: [min-max] current  Temp:  [97.6 F (36.4 C)-99.6 F (37.6 C)] 98.5 F (36.9 C) (09/10 0821) Pulse Rate:  [69-78] 74 (09/10 0821) Resp:  [16] 16 (09/10 0821) BP: (137-145)/(97-115) 145/115 (09/10 0821) SpO2:  [96 %] 96 % (09/10 0821)     Height: 6' 1 (185.4 cm) Weight: 113.1 kg BMI (Calculated): 32.9   Intake/Output last 2 shifts:  09/09 0701 - 09/10 0700 In: 2065.1 [P.O.:360; I.V.:1555.1; IV Piggyback:150] Out: -    Physical Exam:  Constitutional: alert, cooperative and no distress  HENT: normocephalic without obvious abnormality  Eyes: PERRL, EOM's grossly intact and symmetric  Respiratory: breathing non-labored at rest  Cardiovascular: regular rate and sinus rhythm  Gastrointestinal: soft, he is tender in the suprapubic region but this is improved this AM, non-distended, no rebound/guarding. He is not overtly peritonitic Musculoskeletal: no edema or wounds, motor and sensation grossly intact, NT    Labs:     Latest Ref Rng & Units 03/16/2024    7:34 AM 03/15/2024    3:29 AM 03/14/2024    3:29 AM  CBC  WBC 4.0 - 10.5 K/uL 9.4  9.1  9.2   Hemoglobin 13.0 - 17.0 g/dL 84.8  85.2  84.9   Hematocrit 39.0 - 52.0 % 43.9  41.6   42.4   Platelets 150 - 400 K/uL 171  144  153       Latest Ref Rng & Units 03/16/2024    7:34 AM 03/15/2024    3:29 AM 03/14/2024    3:29 AM  CMP  Glucose 70 - 99 mg/dL 92  899  98   BUN 6 - 20 mg/dL 7  9  14    Creatinine 0.61 - 1.24 mg/dL 9.31  9.15  9.31   Sodium 135 - 145 mmol/L 137  140  135   Potassium 3.5 - 5.1 mmol/L 3.7  3.6  3.6   Chloride 98 - 111 mmol/L 100  106  103   CO2 22 - 32 mmol/L 24  23  23    Calcium 8.9 - 10.3 mg/dL 8.8  8.7  8.8   Total Protein 6.5 - 8.1 g/dL  7.1    Total Bilirubin 0.0 - 1.2 mg/dL  1.6    Alkaline Phos 38 - 126 U/L  80    AST 15 - 41 U/L  39    ALT 0 - 44 U/L  31      Imaging studies: No new pertinent imaging studies   Assessment/Plan: 38 y.o. male with perforated diverticulitis   - Plan for repeat CT Abdomen/Pelvis this AM to reassess intra-abdominal process and ensure no development of abscess. If he has developed abscess, I will review with IR regarding possible drain placements.  - Continue NPO for now; okay for ice chips and sips with medications - If no  abscess or need for procedure, we can do CLD  - No need for surgical intervention. He understands if he clinically deteriorates we will need to proceed with surgical intervention and likely temporizing colostomy (ie: Hartmann's) - Continue IV Abx (Zosyn )   - Continue IVF support; renewed this AM  - Monitor abdominal examination; on-going bowel function   - Monitor leukocytosis; normalized  - CIWA protocol  - Pain control prn; antiemetics prn  - Mobilize as tolerated   - Appreciate hospitalist assistance    All of the above findings and recommendations were discussed with the patient, and the medical team, and all of patient's questions were answered to his expressed satisfaction.  -- Arthea Platt, PA-C [facility] 03/16/2024, 8:47 AM M-F: 7am - 4pm

## 2024-03-16 NOTE — Consult Note (Signed)
 Chief Complaint:  Perforated Diverticulitis w/ intra-abdominal fluid collection  Procedure: Percutaneous drain placement   Referring Provider(s): Arthea Platt, PA-C  Supervising Physician: Karalee Beat  Patient Status: [facility] - In-pt  History of Present Illness: Christian Dickerson is a 38 y.o. male with no significant history who presented to the ED on 9/7 with abdominal pain for the previous 4 days. Workup revealed mildly elevated WBC, lactic 1.4, and imaging concerning for severe proximal sigmoid diverticulitis w/ a large amount of gas seen in adjacent fat consistent with localized perforation. Surgery was consulted and recommended bowel rest with broad-spectrum antibiotics. Patient was progressing well and repeat CT A/P was ordered to ensure no interval abscess development. Unfortunately, the read came back with multiple fluid collection in the pericolonic fat with small air-fluid levels suggestive of developing abscess. IR consulted for evaluation of possible drain placement. Case and imaging reviewed and approved by Dr. VEAR Karalee.   Patient resting in bed in no acute distress. On first evaluation, was not agreeable to procedure and requested more time to think about it. Later informed that patient wishes to proceed.   States that he continues to have night sweats, but denies any fevers/chills, nausea/vomiting. Admits to intermittent pain in his left lower quadrant, particularly at night. States he has had multiple bowel movements today. NPO for the past several days. All questions and concerns answered at the bedside.   Patient is Full Code  History reviewed. No pertinent past medical history.  Past Surgical History:  Procedure Laterality Date   ANKLE SURGERY     CHEST TUBE INSERTION     LITHOTRIPSY     TIBIA FRACTURE SURGERY     TONSILLECTOMY AND ADENOIDECTOMY      Allergies: Patient has no known allergies.  Medications: Prior to Admission medications   Not on File      History reviewed. No pertinent family history.  Social History   Socioeconomic History   Marital status: Single    Spouse name: Not on file   Number of children: Not on file   Years of education: Not on file   Highest education level: Not on file  Occupational History   Not on file  Tobacco Use   Smoking status: Every Day    Current packs/day: 1.00    Types: Cigarettes   Smokeless tobacco: Never  Substance and Sexual Activity   Alcohol use: Yes    Alcohol/week: 35.0 standard drinks of alcohol    Types: 35 Shots of liquor per week    Comment: 1/5 a day   Drug use: No   Sexual activity: Not on file  Other Topics Concern   Not on file  Social History Narrative   Not on file   Social Drivers of Health   Financial Resource Strain: Not on file  Food Insecurity: Food Insecurity Present (03/13/2024)   Hunger Vital Sign    Worried About Running Out of Food in the Last Year: Sometimes true    Ran Out of Food in the Last Year: Never true  Transportation Needs: No Transportation Needs (03/13/2024)   PRAPARE - Administrator, Civil Service (Medical): No    Lack of Transportation (Non-Medical): No  Physical Activity: Not on file  Stress: Not on file  Social Connections: Not on file     Review of Systems  Constitutional:        Night sweats  Gastrointestinal:  Positive for abdominal pain (intermittent LLQ pain).  Patient denies  any headache, chest pain, shortness of breath, abdominal pain, N/V, or fever/chills. All other systems are negative.   Vital Signs: BP (!) 145/115 (BP Location: Left Arm)   Pulse 74   Temp 98.5 F (36.9 C) (Oral)   Resp 16   Ht 6' 1 (1.854 m)   Wt 249 lb 5.4 oz (113.1 kg)   SpO2 96%   BMI 32.90 kg/m     Physical Exam Constitutional:      Appearance: He is well-developed.  HENT:     Mouth/Throat:     Mouth: Mucous membranes are moist.     Pharynx: Oropharynx is clear.  Cardiovascular:     Rate and Rhythm: Normal rate and  regular rhythm.     Heart sounds: Normal heart sounds.  Pulmonary:     Effort: Pulmonary effort is normal.     Breath sounds: Normal breath sounds.  Abdominal:     General: Abdomen is flat.     Palpations: Abdomen is soft.     Tenderness: There is abdominal tenderness (moderate LLQ).  Skin:    General: Skin is warm and dry.  Neurological:     Mental Status: He is alert and oriented to person, place, and time.  Psychiatric:        Behavior: Behavior normal.     Imaging: CT ABDOMEN PELVIS W CONTRAST Result Date: 03/16/2024 CLINICAL DATA:  Sigmoid diverticulitis. EXAM: CT ABDOMEN AND PELVIS WITH CONTRAST TECHNIQUE: Multidetector CT imaging of the abdomen and pelvis was performed using the standard protocol following bolus administration of intravenous contrast. RADIATION DOSE REDUCTION: This exam was performed according to the departmental dose-optimization program which includes automated exposure control, adjustment of the mA and/or kV according to patient size and/or use of iterative reconstruction technique. CONTRAST:  OMNIPAQUE  IOHEXOL  300 MG/ML  SOLN COMPARISON:  March 13, 2024. FINDINGS: Lower chest: No acute abnormality. Hepatobiliary: No focal liver abnormality is seen. No gallstones, gallbladder wall thickening, or biliary dilatation. Pancreas: Unremarkable. No pancreatic ductal dilatation or surrounding inflammatory changes. Spleen: Normal in size without focal abnormality. Adrenals/Urinary Tract: Adrenal glands and kidneys appear normal. Urinary bladder is decompressed Stomach/Bowel: The stomach and appendix are unremarkable. There is no evidence of bowel obstruction. There remains severe diverticulitis involving proximal sigmoid colon. There are now noted multiple fluid collections in the pericolonic fat superiorly to the inflamed segment of colon with small air-fluid levels suggesting developing abscesses. The largest measures 4.5 x 3.1 cm. Vascular/Lymphatic: No significant  vascular findings are present. No enlarged abdominal or pelvic lymph nodes. Reproductive: Prostate is unremarkable. Other: Small fat containing right inguinal hernia is noted. Musculoskeletal: No acute or significant osseous findings. IMPRESSION: There remains severe diverticulitis involving proximal sigmoid colon. There are now noted multiple fluid collections in the pericolonic fat superior to the inflamed segment of colon with small air-fluid levels suggesting developing abscesses. The largest measures 4.5 x 3.1 cm. Electronically Signed   By: Lynwood Landy Raddle M.D.   On: 03/16/2024 10:59   CT ANGIO GI BLEED Result Date: 03/13/2024 CLINICAL DATA:  Rectal bleeding, acute lower abdominal pain. EXAM: CTA ABDOMEN AND PELVIS WITHOUT AND WITH CONTRAST TECHNIQUE: Multidetector CT imaging of the abdomen and pelvis was performed using the standard protocol during bolus administration of intravenous contrast. Multiplanar reconstructed images and MIPs were obtained and reviewed to evaluate the vascular anatomy. RADIATION DOSE REDUCTION: This exam was performed according to the departmental dose-optimization program which includes automated exposure control, adjustment of the mA and/or kV according to  patient size and/or use of iterative reconstruction technique. CONTRAST:  OMNIPAQUE  IOHEXOL  350 MG/ML SOLN COMPARISON:  April 29, 2019. FINDINGS: VASCULAR Aorta: Normal caliber aorta without aneurysm, dissection, vasculitis or significant stenosis. Celiac: Patent without evidence of aneurysm, dissection, vasculitis or significant stenosis. SMA: Patent without evidence of aneurysm, dissection, vasculitis or significant stenosis. Renals: Both renal arteries are patent without evidence of aneurysm, dissection, vasculitis, fibromuscular dysplasia or significant stenosis. IMA: Patent without evidence of aneurysm, dissection, vasculitis or significant stenosis. Inflow: Patent without evidence of aneurysm, dissection,  vasculitis or significant stenosis. Proximal Outflow: Bilateral common femoral and visualized portions of the superficial and profunda femoral arteries are patent without evidence of aneurysm, dissection, vasculitis or significant stenosis. Veins: No obvious venous abnormality within the limitations of this arterial phase study. Review of the MIP images confirms the above findings. NON-VASCULAR Lower chest: No acute abnormality. Hepatobiliary: No focal liver abnormality is seen. No gallstones, gallbladder wall thickening, or biliary dilatation. Pancreas: Unremarkable. No pancreatic ductal dilatation or surrounding inflammatory changes. Spleen: Normal in size without focal abnormality. Adrenals/Urinary Tract: Adrenal glands are unremarkable. Kidneys are normal, without renal calculi, focal lesion, or hydronephrosis. Bladder is unremarkable. Stomach/Bowel: Stomach and appendix are unremarkable. There is no evidence of bowel obstruction. Severe proximal sigmoid diverticulitis is noted with a large amount of gas in the adjacent fat consistent with localized perforation. Lymphatic: No adenopathy is noted. Reproductive: Prostate is unremarkable. Other: Small fat containing right inguinal hernia.  No ascites. Musculoskeletal: No acute or significant osseous findings. IMPRESSION: Severe proximal sigmoid diverticulitis is noted with large amount of gas seen in the adjacent fat consistent with localized perforation. Critical Value/emergent results were called by telephone at the time of interpretation on 03/13/2024 at 4:41 pm to provider NEHA RAY , who verbally acknowledged these results. Small fat containing right inguinal hernia. No definite evidence of active gastrointestinal bleeding. Electronically Signed   By: Lynwood Landy Raddle M.D.   On: 03/13/2024 16:42    Labs:  CBC: Recent Labs    03/13/24 1412 03/14/24 0329 03/15/24 0329 03/16/24 0734  WBC 11.7* 9.2 9.1 9.4  HGB 17.1* 15.0 14.7 15.1  HCT 47.6 42.4 41.6  43.9  PLT 192 153 144* 171    COAGS: Recent Labs    03/16/24 1223  INR 1.2    BMP: Recent Labs    03/13/24 1412 03/14/24 0329 03/15/24 0329 03/16/24 0734  NA 137 135 140 137  K 3.2* 3.6 3.6 3.7  CL 99 103 106 100  CO2 19* 23 23 24   GLUCOSE 99 98 100* 92  BUN 13 14 9 7   CALCIUM 9.1 8.8* 8.7* 8.8*  CREATININE 0.95 0.68 0.84 0.68  GFRNONAA >60 >60 >60 >60    LIVER FUNCTION TESTS: Recent Labs    03/13/24 1412 03/15/24 0329  BILITOT 0.9 1.6*  AST 42* 39  ALT 37 31  ALKPHOS 95 80  PROT 8.2* 7.1  ALBUMIN 3.8 3.3*    TUMOR MARKERS: No results for input(s): AFPTM, CEA, CA199, CHROMGRNA in the last 8760 hours.  Assessment and Plan:  Perforated Diverticulitis w/ intra-abdominal fluid collection: ELEUTERIO DOLLAR is a 38 y.o. male with a history of abdominal pain for the past several days found to have perforated diverticulitis. Repeat imaging today showed concern for developing abscess. IR with request for drain placement which was approved by Dr. Karalee. Procedure to be performed under moderate sedation.  Risks and benefits discussed with the patient including bleeding, infection, damage to adjacent structures, bowel  perforation/fistula connection, and sepsis.  All of the patient's questions were answered, patient is agreeable to proceed. Consent signed and in chart.   Thank you for allowing our service to participate in JAMANI BEARCE 's care.    Electronically Signed: Quamir Willemsen M Sven Pinheiro, PA-C   03/16/2024, 3:48 PM     I spent a total of 40 Minutes in face to face in clinical consultation, greater than 50% of which was counseling/coordinating care for drain placement.

## 2024-03-16 NOTE — Procedures (Signed)
 Interventional Radiology Procedure Note  Procedure: CT guided placement of a 5F drain vis LLQ.  Evacuation of 40 mL thick, purulent fluid.   Complications: None  Estimated Blood Loss: None  Recommendations: - Drain to JP x 224-48 hrs, then convert to bag - Flushing 10 mL every 8 hrs - Cultures sent  Signed,  Wilkie LOIS Lent, MD

## 2024-03-17 DIAGNOSIS — K5720 Diverticulitis of large intestine with perforation and abscess without bleeding: Secondary | ICD-10-CM | POA: Diagnosis not present

## 2024-03-17 MED ORDER — AMLODIPINE BESYLATE 5 MG PO TABS
5.0000 mg | ORAL_TABLET | Freq: Every day | ORAL | Status: DC
  Administered 2024-03-17 – 2024-03-22 (×6): 5 mg via ORAL
  Filled 2024-03-17 (×6): qty 1

## 2024-03-17 NOTE — Progress Notes (Addendum)
 Progress Note    Christian Dickerson  FMW:[id number] DOB: 1985/10/17  DOA: 03/13/2024 PCP: Patient, No Pcp Per      Brief Narrative:    Medical records reviewed and are as summarized below:  Christian Dickerson is a 38 y.o. male with medical history significant for alcohol use disorder, tobacco use disorder, previous falls with fractures, ankle surgery, chest tube insertion, tibia fracture surgery, tonsillectomy and adenoidectomy, who presented to the hospital because of abdominal pain of about 4 days duration.    He was found to have acute diverticulitis with perforation.  He was admitted to the surgical service and the hospitalist team was consulted to assist with management of alcohol use disorder with concern for alcohol withdrawal syndrome.        Assessment/Plan:   Principal Problem:   Diverticulitis of colon with perforation Active Problems:   Alcoholism (HCC)   Alcoholic hepatitis    Body mass index is 32.9 kg/m.    Acute diverticulitis with perforation: Repeat CT abdomen and pelvis on 03/16/2024 showed severe proximal sigmoid diverticulitis and multiple fluid collections suggesting developing abscesses. S/p drain placement to left lower quadrant with evacuation of 40 mL foul-smelling purulent fluid on 03/16/2024. He has been started on soft diet. He is also on IV Zosyn  for diverticulitis.  Follow-up with general surgeon for further recommendations.   Hypokalemia: Improved   Hypertension: Today, patient reported his BP has actually been elevated for some months now.  He said he is to donate blood but the last time he went for blood donation, he was told his blood pressure was too high.  We discussed management of hypertension and its long-term complications.  We discussed lifestyle changes and pharmacologic treatment.  He is okay with starting amlodipine . He knows he has to stop drinking alcohol and stop smoking cigarettes.   Mildly elevated liver enzymes:  Improved.  Likely due to alcohol use disorder.   Alcohol use disorder: Use IV Ativan  as needed per CIWA protocol.   Tobacco use disorder: Continue nicotine  patch     Diet Order             DIET SOFT Room service appropriate? Yes; Fluid consistency: Thin  Diet effective 1000                                  Consultants: General Surgeon Interventional radiologist  Procedures: S/p drain placement to left lower quadrant with evacuation of 40 mL foul-smelling purulent fluid on 03/16/2024.    Medications:    folic acid   1 mg Oral Daily   heparin   5,000 Units Subcutaneous Q8H   LORazepam   0-4 mg Intravenous Q12H   multivitamin with minerals  1 tablet Oral Daily   nicotine   21 mg Transdermal Daily   pantoprazole  (PROTONIX ) IV  40 mg Intravenous QHS   sodium chloride  flush  5 mL Intracatheter Q8H   thiamine   100 mg Oral Daily   Or   thiamine   100 mg Intravenous Daily   Continuous Infusions:  piperacillin -tazobactam (ZOSYN )  IV 3.375 g (03/17/24 1310)     Anti-infectives (From admission, onward)    Start     Dose/Rate Route Frequency Ordered Stop   03/14/24 0600  piperacillin -tazobactam (ZOSYN ) IVPB 3.375 g        3.375 g 12.5 mL/hr over 240 Minutes Intravenous Every 8 hours 03/14/24 0228 03/21/24 0559   03/13/24 1845  piperacillin -tazobactam (  ZOSYN ) IVPB 3.375 g  Status:  Discontinued        3.375 g 12.5 mL/hr over 240 Minutes Intravenous Every 8 hours 03/13/24 1839 03/14/24 0228   03/13/24 1700  piperacillin -tazobactam (ZOSYN ) IVPB 3.375 g        3.375 g 100 mL/hr over 30 Minutes Intravenous  Once 03/13/24 1649 03/13/24 1750              Family Communication/Anticipated D/C date and plan/Code Status   DVT prophylaxis: heparin  injection 5,000 Units Start: 03/13/24 2200 SCDs Start: 03/13/24 1835     Code Status: Full Code  Family Communication: None Disposition Plan: Plan to discharge home   Status is: Inpatient Remains  inpatient appropriate because: Severe diverticulitis       Subjective:   Interval events noted.  Abdominal pain is better.  He said he is homeless and is worried about being discharged home with a drain.    Objective:    Vitals:   03/16/24 1700 03/16/24 2006 03/17/24 0314 03/17/24 0841  BP: (!) 139/103 (!) 136/94 (!) 149/93 (!) 144/91  Pulse: 77 81 78 72  Resp: 18 18 16 16   Temp: 99.4 F (37.4 C) 98.9 F (37.2 C) 97.7 F (36.5 C) 98 F (36.7 C)  TempSrc: Temporal Oral  Oral  SpO2: 96% 97% 95% 95%  Weight:      Height:       No data found.   Intake/Output Summary (Last 24 hours) at 03/17/2024 1617 Last data filed at 03/17/2024 1317 Gross per 24 hour  Intake 2212.02 ml  Output 65 ml  Net 2147.02 ml   Filed Weights   03/13/24 2356 03/16/24 1548  Weight: 113.1 kg 113.1 kg    Exam:   GEN: NAD SKIN: Warm and dry EYES: No pallor or icterus ENT: MMM CV: RRR PULM: CTA B ABD: soft, ND, mild lower abdominal tenderness, +BS, + JP drain with bloody fluid CNS: AAO x 3, non focal EXT: No edema or tenderness       Data Reviewed:   I have personally reviewed following labs and imaging studies:  Labs: Labs show the following:   Basic Metabolic Panel: Recent Labs  Lab 03/13/24 1412 03/14/24 0329 03/15/24 0329 03/16/24 0734  NA 137 135 140 137  K 3.2* 3.6 3.6 3.7  CL 99 103 106 100  CO2 19* 23 23 24   GLUCOSE 99 98 100* 92  BUN 13 14 9 7   CREATININE 0.95 0.68 0.84 0.68  CALCIUM 9.1 8.8* 8.7* 8.8*  MG  --   --  2.0  --    GFR Estimated Creatinine Clearance: 165 mL/min (by C-G formula based on SCr of 0.68 mg/dL). Liver Function Tests: Recent Labs  Lab 03/13/24 1412 03/15/24 0329  AST 42* 39  ALT 37 31  ALKPHOS 95 80  BILITOT 0.9 1.6*  PROT 8.2* 7.1  ALBUMIN 3.8 3.3*   Recent Labs  Lab 03/13/24 1412  LIPASE 54*   No results for input(s): AMMONIA in the last 168 hours. Coagulation profile Recent Labs  Lab 03/16/24 1223  INR 1.2     CBC: Recent Labs  Lab 03/13/24 1412 03/14/24 0329 03/15/24 0329 03/16/24 0734  WBC 11.7* 9.2 9.1 9.4  HGB 17.1* 15.0 14.7 15.1  HCT 47.6 42.4 41.6 43.9  MCV 100.0 102.7* 102.7* 103.3*  PLT 192 153 144* 171   Cardiac Enzymes: No results for input(s): CKTOTAL, CKMB, CKMBINDEX, TROPONINI in the last 168 hours. BNP (last 3 results) No  results for input(s): PROBNP in the last 8760 hours. CBG: No results for input(s): GLUCAP in the last 168 hours. D-Dimer: No results for input(s): DDIMER in the last 72 hours. Hgb A1c: No results for input(s): HGBA1C in the last 72 hours. Lipid Profile: No results for input(s): CHOL, HDL, LDLCALC, TRIG, CHOLHDL, LDLDIRECT in the last 72 hours. Thyroid function studies: No results for input(s): TSH, T4TOTAL, T3FREE, THYROIDAB in the last 72 hours.  Invalid input(s): FREET3 Anemia work up: No results for input(s): VITAMINB12, FOLATE, FERRITIN, TIBC, IRON, RETICCTPCT in the last 72 hours. Sepsis Labs: Recent Labs  Lab 03/13/24 1412 03/13/24 1701 03/13/24 2002 03/14/24 0329 03/15/24 0329 03/16/24 0734  WBC 11.7*  --   --  9.2 9.1 9.4  LATICACIDVEN  --  1.4 1.2  --   --   --     Microbiology Recent Results (from the past 240 hours)  Blood Culture (routine x 2)     Status: None (Preliminary result)   Collection Time: 03/13/24  5:01 PM   Specimen: BLOOD LEFT HAND  Result Value Ref Range Status   Specimen Description BLOOD LEFT HAND  Final   Special Requests   Final    BOTTLES DRAWN AEROBIC AND ANAEROBIC Blood Culture results may not be optimal due to an inadequate volume of blood received in culture bottles   Culture   Final    NO GROWTH 4 DAYS Performed at [facility], [address] [place], [place] [address]    Report Status PENDING  Incomplete  Blood Culture (routine x 2)     Status: None (Preliminary result)   Collection Time: 03/13/24  5:01 PM   Specimen: BLOOD   Result Value Ref Range Status   Specimen Description BLOOD RIGHT ANTECUBITAL  Final   Special Requests   Final    BOTTLES DRAWN AEROBIC AND ANAEROBIC Blood Culture results may not be optimal due to an inadequate volume of blood received in culture bottles   Culture   Final    NO GROWTH 4 DAYS Performed at [facility], [address] [place], [place] [address]    Report Status PENDING  Incomplete  Aerobic/Anaerobic Culture w Gram Stain (surgical/deep wound)     Status: None (Preliminary result)   Collection Time: 03/16/24  4:59 PM   Specimen: Abscess  Result Value Ref Range Status   Specimen Description   Final    ABSCESS Performed at [facility], [address], [place], [place] [address]    Special Requests   Final    Normal Performed at [facility], [address] [place], [place] [address]    Gram Stain   Final    ABUNDANT WBC PRESENT, PREDOMINANTLY PMN FEW GRAM NEGATIVE RODS FEW GRAM POSITIVE COCCI FEW GRAM POSITIVE RODS Performed at [facility], [address], [place], [place] [address]    Culture PENDING  Incomplete   Report Status PENDING  Incomplete    Procedures and diagnostic studies:  CT GUIDED PERITONEAL/RETROPERITONEAL FLUID DRAIN BY PERC CATH Result Date: 03/17/2024 INDICATION: 38 year old male with perforated diverticulitis complicated by intra-abdominal abscess formation. He has failed conservative therapy and his abscess is progressing. Therefore, he presents for placement of a CT-guided drainage catheter. EXAM: CT-guided drain placement MEDICATIONS: The patient is currently admitted to the hospital and receiving intravenous antibiotics. The antibiotics were administered within an appropriate time frame prior to the initiation of the procedure. ANESTHESIA/SEDATION: Moderate (conscious) sedation was employed during this procedure.  A total of Versed  2 mg and Fentanyl  100 mcg was administered intravenously by the radiology  nurse. Total intra-service moderate Sedation Time: 17 minutes. The patient's level of consciousness and vital signs were monitored continuously by radiology nursing throughout the procedure under my direct supervision. COMPLICATIONS: None immediate. PROCEDURE: Informed written consent was obtained from the patient after a thorough discussion of the procedural risks, benefits and alternatives. All questions were addressed. Maximal Sterile Barrier Technique was utilized including caps, mask, sterile gowns, sterile gloves, sterile drape, hand hygiene and skin antiseptic. A timeout was performed prior to the initiation of the procedure. A planning axial CT scan was performed. A suitable skin entry site in the left lower quadrant was identified and marked. The skin was sterilely prepped and draped in the standard fashion using chlorhexidine skin prep. Local anesthesia was attained by infiltration with 1% lidocaine . A small dermatotomy was made. Under intermittent CT guidance, an 18 gauge trocar needle was carefully advanced along the an avascular plane into the fluid collection. A 0.035 wire was then coiled in the fluid collection. The percutaneous tract was dilated to 12 [place]. A Cook 12 [place] all-purpose drainage catheter was advanced over the wire and formed. Aspiration yields 40 mL of foul-smelling purulent fluid. This was sent for Gram stain and culture. The catheter was then flushed and connected to JP bulb suction. The catheter was secured to the skin with 0 Prolene suture and bandages were applied. Post placement CT imaging demonstrates a well-positioned drainage catheter and significant reduction in the volume of intra-abdominal abscess. IMPRESSION: Successful placement of a 12 French drainage catheter via a left lower quadrant approach with evacuation of 40 mL thick foul-smelling purulent fluid. PLAN: Drain should be maintained to JP bulb suction for the first 24-48 hours and then transitioned to gravity bag  drainage to minimize the risk of fistula formation. Electronically Signed   By: Wilkie Lent M.D.   On: 03/17/2024 08:42   CT ABDOMEN PELVIS W CONTRAST Result Date: 03/16/2024 CLINICAL DATA:  Sigmoid diverticulitis. EXAM: CT ABDOMEN AND PELVIS WITH CONTRAST TECHNIQUE: Multidetector CT imaging of the abdomen and pelvis was performed using the standard protocol following bolus administration of intravenous contrast. RADIATION DOSE REDUCTION: This exam was performed according to the departmental dose-optimization program which includes automated exposure control, adjustment of the mA and/or kV according to patient size and/or use of iterative reconstruction technique. CONTRAST:  OMNIPAQUE  IOHEXOL  300 MG/ML  SOLN COMPARISON:  March 13, 2024. FINDINGS: Lower chest: No acute abnormality. Hepatobiliary: No focal liver abnormality is seen. No gallstones, gallbladder wall thickening, or biliary dilatation. Pancreas: Unremarkable. No pancreatic ductal dilatation or surrounding inflammatory changes. Spleen: Normal in size without focal abnormality. Adrenals/Urinary Tract: Adrenal glands and kidneys appear normal. Urinary bladder is decompressed Stomach/Bowel: The stomach and appendix are unremarkable. There is no evidence of bowel obstruction. There remains severe diverticulitis involving proximal sigmoid colon. There are now noted multiple fluid collections in the pericolonic fat superiorly to the inflamed segment of colon with small air-fluid levels suggesting developing abscesses. The largest measures 4.5 x 3.1 cm. Vascular/Lymphatic: No significant vascular findings are present. No enlarged abdominal or pelvic lymph nodes. Reproductive: Prostate is unremarkable. Other: Small fat containing right inguinal hernia is noted. Musculoskeletal: No acute or significant osseous findings. IMPRESSION: There remains severe diverticulitis involving proximal sigmoid colon. There are now noted multiple fluid collections  in the pericolonic fat superior to the inflamed segment of colon with small air-fluid levels suggesting developing abscesses. The largest  measures 4.5 x 3.1 cm. Electronically Signed   By: Lynwood Landy Raddle M.D.   On: 03/16/2024 10:59               LOS: 4 days   Elimelech Houseman  [facility]   Pager on [email or web address]. If 7PM-7AM, please contact night-coverage at [email or web address]     03/17/2024, 4:17 PM

## 2024-03-17 NOTE — Progress Notes (Signed)
 [facility] SURGICAL PROGRESS NOTE (cpt [id number])  Hospital Day(s): 4.   Interval History: Patient seen and examined, no acute events or new complaints overnight. Patient reports he is feeling much better. She is sore at drain site expectedly. No fever, chills, nausea, emesis. No new labs this morning. He did have placement of percutaneous drain with IR yesterday (09/10). Cx from this with GNR, GPC, and GPR. Output 30 ccs in last 24 hours. He is on CLD; no issues. Continues to have bowel function.   Review of Systems:  Constitutional: denies fever, chills  HEENT: denies cough or congestion  Respiratory: denies any shortness of breath  Cardiovascular: denies chest pain or palpitations  Gastrointestinal: + abdominal pain (improving), denied N/V Genitourinary: denies burning with urination or urinary frequency  Vital signs in last 24 hours: [min-max] current  Temp:  [97.7 F (36.5 C)-100 F (37.8 C)] 97.7 F (36.5 C) (09/11 0314) Pulse Rate:  [73-81] 78 (09/11 0314) Resp:  [9-19] 16 (09/11 0314) BP: (134-149)/(84-115) 149/93 (09/11 0314) SpO2:  [95 %-99 %] 95 % (09/11 0314) Weight:  [113.1 kg] 113.1 kg (09/10 1548)     Height: 6' 1 (185.4 cm) Weight: 113.1 kg BMI (Calculated): 32.9   Intake/Output last 2 shifts:  09/10 0701 - 09/11 0700 In: 1967 [P.O.:430; I.V.:1351.5; IV Piggyback:165.5] Out: 30 [Drains:30]   Physical Exam:  Constitutional: alert, cooperative and no distress  HENT: normocephalic without obvious abnormality  Eyes: PERRL, EOM's grossly intact and symmetric  Respiratory: breathing non-labored at rest  Cardiovascular: regular rate and sinus rhythm  Gastrointestinal: soft, previous tenderness is improved, non-distended, no rebound/guarding. He is not overtly peritonitic. Drain in LLQ; output seropurulent  Musculoskeletal: no edema or wounds, motor and sensation grossly intact, NT    Labs:     Latest Ref Rng & Units 03/16/2024    7:34 AM 03/15/2024     3:29 AM 03/14/2024    3:29 AM  CBC  WBC 4.0 - 10.5 K/uL 9.4  9.1  9.2   Hemoglobin 13.0 - 17.0 g/dL 84.8  85.2  84.9   Hematocrit 39.0 - 52.0 % 43.9  41.6  42.4   Platelets 150 - 400 K/uL 171  144  153       Latest Ref Rng & Units 03/16/2024    7:34 AM 03/15/2024    3:29 AM 03/14/2024    3:29 AM  CMP  Glucose 70 - 99 mg/dL 92  899  98   BUN 6 - 20 mg/dL 7  9  14    Creatinine 0.61 - 1.24 mg/dL 9.31  9.15  9.31   Sodium 135 - 145 mmol/L 137  140  135   Potassium 3.5 - 5.1 mmol/L 3.7  3.6  3.6   Chloride 98 - 111 mmol/L 100  106  103   CO2 22 - 32 mmol/L 24  23  23    Calcium 8.9 - 10.3 mg/dL 8.8  8.7  8.8   Total Protein 6.5 - 8.1 g/dL  7.1    Total Bilirubin 0.0 - 1.2 mg/dL  1.6    Alkaline Phos 38 - 126 U/L  80    AST 15 - 41 U/L  39    ALT 0 - 44 U/L  31      Imaging studies: No new pertinent imaging studies   Assessment/Plan: 38 y.o. male with perforated diverticulitis   - We can begin to advance diet today; FLD for breakfast. If he does well, we can do  soft/low fiber diet for lunch/dinner  - Continue drain; monitor and record output; appreciate IR assistance   - No need for surgical intervention. He understands if he clinically deteriorates we will need to proceed with surgical intervention and likely temporizing colostomy (ie: Hartmann's) - Continue IV Abx (Zosyn ) - follow up Cx from 09/10  - Monitor abdominal examination; on-going bowel function   - Monitor leukocytosis; normalized  - CIWA protocol  - Pain control prn; antiemetics prn  - Mobilize as tolerated   - Appreciate hospitalist assistance     - He is progressing well. Diet advancing. Potentially home tomorrow (09/12) or over the weekend pending clinical progress  All of the above findings and recommendations were discussed with the patient, and the medical team, and all of patient's questions were answered to his expressed satisfaction.  -- Arthea Platt, PA-C [facility] 03/17/2024, 7:28  AM M-F: 7am - 4pm

## 2024-03-18 DIAGNOSIS — K5720 Diverticulitis of large intestine with perforation and abscess without bleeding: Secondary | ICD-10-CM | POA: Diagnosis not present

## 2024-03-18 LAB — CULTURE, BLOOD (ROUTINE X 2)
Culture: NO GROWTH
Culture: NO GROWTH

## 2024-03-18 NOTE — Progress Notes (Signed)
 Progress Note    Christian Dickerson  FMW:[id number] DOB: [birth year]  DOA: 03/13/2024 PCP: Patient, No Pcp Per      Brief Narrative:    Medical records reviewed and are as summarized below:  Christian Dickerson is a 38 y.o. male with medical history significant for alcohol use disorder, tobacco use disorder, previous falls with fractures, ankle surgery, chest tube insertion, tibia fracture surgery, tonsillectomy and adenoidectomy, who presented to the hospital because of abdominal pain of about 4 days duration.    He was found to have acute diverticulitis with perforation.  He was admitted to the surgical service and the hospitalist team was consulted to assist with management of alcohol use disorder with concern for alcohol withdrawal syndrome.        Assessment/Plan:   Principal Problem:   Diverticulitis of colon with perforation Active Problems:   Alcoholism (HCC)   Alcoholic hepatitis    Body mass index is 32.9 kg/m.    Acute diverticulitis with perforation: Repeat CT abdomen and pelvis on 03/16/2024 showed severe proximal sigmoid diverticulitis and multiple fluid collections suggesting developing abscesses. S/p drain placement to left lower quadrant with evacuation of 40 mL foul-smelling purulent fluid on 03/16/2024. He is tolerating a soft diet.  He is on IV Zosyn  for diverticulitis.   Follow-up with general surgeon for further recommendations.   Hypokalemia: Improved   Hypertension: Continue amlodipine , started during this hospitalization.    Mildly elevated liver enzymes: Improved.  Likely due to alcohol use disorder.   Alcohol use disorder: Use IV Ativan  as needed per CIWA protocol.   Tobacco use disorder: Continue nicotine  patch     Diet Order             DIET SOFT Room service appropriate? Yes; Fluid consistency: Thin  Diet effective 1000                                  Consultants: General Surgeon Interventional  radiologist  Procedures: S/p drain placement to left lower quadrant with evacuation of 40 mL foul-smelling purulent fluid on 03/16/2024.    Medications:    amLODipine   5 mg Oral Daily   folic acid   1 mg Oral Daily   heparin   5,000 Units Subcutaneous Q8H   multivitamin with minerals  1 tablet Oral Daily   nicotine   21 mg Transdermal Daily   pantoprazole  (PROTONIX ) IV  40 mg Intravenous QHS   sodium chloride  flush  5 mL Intracatheter Q8H   thiamine   100 mg Oral Daily   Or   thiamine   100 mg Intravenous Daily   Continuous Infusions:  piperacillin -tazobactam (ZOSYN )  IV 3.375 g (03/18/24 1347)     Anti-infectives (From admission, onward)    Start     Dose/Rate Route Frequency Ordered Stop   03/14/24 0600  piperacillin -tazobactam (ZOSYN ) IVPB 3.375 g        3.375 g 12.5 mL/hr over 240 Minutes Intravenous Every 8 hours 03/14/24 0228 03/21/24 0559   03/13/24 1845  piperacillin -tazobactam (ZOSYN ) IVPB 3.375 g  Status:  Discontinued        3.375 g 12.5 mL/hr over 240 Minutes Intravenous Every 8 hours 03/13/24 1839 03/14/24 0228   03/13/24 1700  piperacillin -tazobactam (ZOSYN ) IVPB 3.375 g        3.375 g 100 mL/hr over 30 Minutes Intravenous  Once 03/13/24 1649 03/13/24 1750  Family Communication/Anticipated D/C date and plan/Code Status   DVT prophylaxis: heparin  injection 5,000 Units Start: 03/13/24 2200 SCDs Start: 03/13/24 1835     Code Status: Full Code  Family Communication: None Disposition Plan: Plan to discharge home   Status is: Inpatient Remains inpatient appropriate because: Severe diverticulitis       Subjective:   Interval events noted.  He complains of abdominal pain and pain in the testicles.  Objective:    Vitals:   03/17/24 1658 03/17/24 2130 03/18/24 0354 03/18/24 0917  BP: (!) 147/95 (!) 135/96 (!) 134/93 (!) 121/102  Pulse: 84 75 70 68  Resp: 18 19 17 18   Temp: 98.2 F (36.8 C) 98.5 F (36.9 C) 97.7 F (36.5 C)  97.8 F (36.6 C)  TempSrc: Oral     SpO2: 96% 98% 99% 99%  Weight:      Height:       No data found.   Intake/Output Summary (Last 24 hours) at 03/18/2024 1424 Last data filed at 03/18/2024 1420 Gross per 24 hour  Intake 370 ml  Output 30 ml  Net 340 ml   Filed Weights   03/13/24 2356 03/16/24 1548  Weight: 113.1 kg 113.1 kg    Exam:   GEN: NAD SKIN: Warm and dry EYES: Anicteric ENT: MMM CV: RRR PULM: CTA B ABD: soft, ND, mild lower abdominal tenderness, +BS, + JP drain with purulent drainage CNS: AAO x 3, non focal EXT: No edema or tenderness GU: He declined exam of the scrotum.     Data Reviewed:   I have personally reviewed following labs and imaging studies:  Labs: Labs show the following:   Basic Metabolic Panel: Recent Labs  Lab 03/13/24 1412 03/14/24 0329 03/15/24 0329 03/16/24 0734  NA 137 135 140 137  K 3.2* 3.6 3.6 3.7  CL 99 103 106 100  CO2 19* 23 23 24   GLUCOSE 99 98 100* 92  BUN 13 14 9 7   CREATININE 0.95 0.68 0.84 0.68  CALCIUM 9.1 8.8* 8.7* 8.8*  MG  --   --  2.0  --    GFR Estimated Creatinine Clearance: 165 mL/min (by C-G formula based on SCr of 0.68 mg/dL). Liver Function Tests: Recent Labs  Lab 03/13/24 1412 03/15/24 0329  AST 42* 39  ALT 37 31  ALKPHOS 95 80  BILITOT 0.9 1.6*  PROT 8.2* 7.1  ALBUMIN 3.8 3.3*   Recent Labs  Lab 03/13/24 1412  LIPASE 54*   No results for input(s): AMMONIA in the last 168 hours. Coagulation profile Recent Labs  Lab 03/16/24 1223  INR 1.2    CBC: Recent Labs  Lab 03/13/24 1412 03/14/24 0329 03/15/24 0329 03/16/24 0734  WBC 11.7* 9.2 9.1 9.4  HGB 17.1* 15.0 14.7 15.1  HCT 47.6 42.4 41.6 43.9  MCV 100.0 102.7* 102.7* 103.3*  PLT 192 153 144* 171   Cardiac Enzymes: No results for input(s): CKTOTAL, CKMB, CKMBINDEX, TROPONINI in the last 168 hours. BNP (last 3 results) No results for input(s): PROBNP in the last 8760 hours. CBG: No results for input(s):  GLUCAP in the last 168 hours. D-Dimer: No results for input(s): DDIMER in the last 72 hours. Hgb A1c: No results for input(s): HGBA1C in the last 72 hours. Lipid Profile: No results for input(s): CHOL, HDL, LDLCALC, TRIG, CHOLHDL, LDLDIRECT in the last 72 hours. Thyroid function studies: No results for input(s): TSH, T4TOTAL, T3FREE, THYROIDAB in the last 72 hours.  Invalid input(s): FREET3 Anemia work up: No  results for input(s): VITAMINB12, FOLATE, FERRITIN, TIBC, IRON, RETICCTPCT in the last 72 hours. Sepsis Labs: Recent Labs  Lab 03/13/24 1412 03/13/24 1701 03/13/24 2002 03/14/24 0329 03/15/24 0329 03/16/24 0734  WBC 11.7*  --   --  9.2 9.1 9.4  LATICACIDVEN  --  1.4 1.2  --   --   --     Microbiology Recent Results (from the past 240 hours)  Blood Culture (routine x 2)     Status: None   Collection Time: 03/13/24  5:01 PM   Specimen: BLOOD LEFT HAND  Result Value Ref Range Status   Specimen Description BLOOD LEFT HAND  Final   Special Requests   Final    BOTTLES DRAWN AEROBIC AND ANAEROBIC Blood Culture results may not be optimal due to an inadequate volume of blood received in culture bottles   Culture   Final    NO GROWTH 5 DAYS Performed at [facility] [address] [place], [place] [address]    Report Status 03/18/2024 FINAL  Final  Blood Culture (routine x 2)     Status: None   Collection Time: 03/13/24  5:01 PM   Specimen: BLOOD  Result Value Ref Range Status   Specimen Description BLOOD RIGHT ANTECUBITAL  Final   Special Requests   Final    BOTTLES DRAWN AEROBIC AND ANAEROBIC Blood Culture results may not be optimal due to an inadequate volume of blood received in culture bottles   Culture   Final    NO GROWTH 5 DAYS Performed at [facility], [address], [place], [place] [address]    Report Status 03/18/2024 FINAL  Final  Aerobic/Anaerobic Culture w Gram Stain (surgical/deep wound)      Status: None (Preliminary result)   Collection Time: 03/16/24  4:59 PM   Specimen: Abscess  Result Value Ref Range Status   Specimen Description   Final    ABSCESS Performed at [facility], [address], [place], [place] [address]    Special Requests   Final    Normal Performed at [facility], [address] [place], [place] [address]    Gram Stain   Final    ABUNDANT WBC PRESENT, PREDOMINANTLY PMN FEW GRAM NEGATIVE RODS FEW GRAM POSITIVE COCCI FEW GRAM POSITIVE RODS    Culture   Final    ABUNDANT ESCHERICHIA COLI SUSCEPTIBILITIES TO FOLLOW Performed at [facility], [address], [place], [place] [address]    Report Status PENDING  Incomplete    Procedures and diagnostic studies:  CT GUIDED PERITONEAL/RETROPERITONEAL FLUID DRAIN BY PERC CATH Result Date: 03/17/2024 INDICATION: 38 year old male with perforated diverticulitis complicated by intra-abdominal abscess formation. He has failed conservative therapy and his abscess is progressing. Therefore, he presents for placement of a CT-guided drainage catheter. EXAM: CT-guided drain placement MEDICATIONS: The patient is currently admitted to the hospital and receiving intravenous antibiotics. The antibiotics were administered within an appropriate time frame prior to the initiation of the procedure. ANESTHESIA/SEDATION: Moderate (conscious) sedation was employed during this procedure. A total of Versed  2 mg and Fentanyl  100 mcg was administered intravenously by the radiology nurse. Total intra-service moderate Sedation Time: 17 minutes. The patient's level of consciousness and vital signs were monitored continuously by radiology nursing throughout the procedure under my direct supervision. COMPLICATIONS: None immediate. PROCEDURE: Informed written consent was obtained from the patient after a thorough discussion of the procedural risks, benefits and alternatives. All questions were addressed. Maximal  Sterile Barrier  Technique was utilized including caps, mask, sterile gowns, sterile gloves, sterile drape, hand hygiene and skin antiseptic. A timeout was performed prior to the initiation of the procedure. A planning axial CT scan was performed. A suitable skin entry site in the left lower quadrant was identified and marked. The skin was sterilely prepped and draped in the standard fashion using chlorhexidine skin prep. Local anesthesia was attained by infiltration with 1% lidocaine . A small dermatotomy was made. Under intermittent CT guidance, an 18 gauge trocar needle was carefully advanced along the an avascular plane into the fluid collection. A 0.035 wire was then coiled in the fluid collection. The percutaneous tract was dilated to 12 [place]. A Cook 12 [place] all-purpose drainage catheter was advanced over the wire and formed. Aspiration yields 40 mL of foul-smelling purulent fluid. This was sent for Gram stain and culture. The catheter was then flushed and connected to JP bulb suction. The catheter was secured to the skin with 0 Prolene suture and bandages were applied. Post placement CT imaging demonstrates a well-positioned drainage catheter and significant reduction in the volume of intra-abdominal abscess. IMPRESSION: Successful placement of a 12 French drainage catheter via a left lower quadrant approach with evacuation of 40 mL thick foul-smelling purulent fluid. PLAN: Drain should be maintained to JP bulb suction for the first 24-48 hours and then transitioned to gravity bag drainage to minimize the risk of fistula formation. Electronically Signed   By: Wilkie Lent M.D.   On: 03/17/2024 08:42               LOS: 5 days   Raiyah Speakman  [facility]   Pager on [email or web address]. If 7PM-7AM, please contact night-coverage at [email or web address]     03/18/2024, 2:24 PM

## 2024-03-18 NOTE — TOC Progression Note (Signed)
 Transition of Care [facility]) - Progression Note    Patient Details  Name: Christian Dickerson MRN: [id number] Date of Birth: [birth year]  Transition of [facility] CM/SW Contact  Corean ONEIDA Haddock, RN Phone Number: 03/18/2024, 11:21 AM  Clinical Narrative:     [facility] consult noted for homeless, drain, needs resources  Spoke with patient.  He states that he has been living in a trailer, which is paid for through 10/1.  His power is on until 9/19.  He states he did not have intentions of returning to the trailer as he has sold his personal belongings.   I inquired what his plan was for when he leaves the hospital.  He states I'm just going to figure it out Patient states that he does not have any income.  Provided patient with housing resources.  I notified him at discharge the only immediate resource at discharge would be the homeless shelter.  Patient states no I would kill myself Patient has brother and sister in law listed as emergency consult.  I inquired if he could stay with them at discharge.  He states my brother had back surgery, she has mental health issues, and an unruly child, no I can't go there  Patient states I still have this bag on me though, so I'm not going anywhere with that  I notified patient that per surgery he will likey discharge with drain in place.  Patient states no I am not, I'm not going anywhere with that  Darlyn Platt PA notified of the above                     Expected Discharge Plan and Services                                               Social Drivers of Health (SDOH) Interventions SDOH Screenings   Food Insecurity: Food Insecurity Present (03/13/2024)  Housing: Low Risk  (03/13/2024)  Transportation Needs: No Transportation Needs (03/13/2024)  Utilities: Not At Risk (03/13/2024)  Tobacco Use: High Risk (03/13/2024)    Readmission Risk Interventions     No data to display

## 2024-03-18 NOTE — Progress Notes (Signed)
 Brief Progress Note -- Discussed patient case with CSW regarding his socioeconomic concerns -- Patient unfortunately will be homeless upon discharge and does not have any outside support -- He is adamantly against going home with the drain which unfortunately is a little more seropurulent looking this afternoon compared to the morning -- I do think we can keep him over the weekend and reassess the output on Monday +/- repeating imaging to reassess for abscess resolution before removal/discharge -- He is okay with this plan   -- Arthea Platt, PA-C [facility] 03/18/2024, 11:37 AM M-F: 7am - 4pm

## 2024-03-18 NOTE — Progress Notes (Signed)
 [facility] SUR[facility]ICAL PRO[facility]ESS NOTE (cpt [phone])  Hospital Day(s): 5.   Interval History: Patient seen and examined, no acute events or new complaints overnight. Patient reports he still has lower abdominal pain; seems to be worse at night. No fever, chills, nausea, emesis. No new labs this morning. Cx from perc drain placement with GNR, GPC, and GPR. Output 30 ccs in last 24 hours. He is on soft diet; no issues. Continues to have bowel function.   Review of Systems:  Constitutional: denies fever, chills  HEENT: denies cough or congestion  Respiratory: denies any shortness of breath  Cardiovascular: denies chest pain or palpitations  Gastrointestinal: + abdominal pain, denied N/V Genitourinary: denies burning with urination or urinary frequency  Vital signs in last 24 hours: [min-max] current  Temp:  [97.7 F (36.5 C)-98.5 F (36.9 C)] 97.7 F (36.5 C) (09/12 0354) Pulse Rate:  [70-84] 70 (09/12 0354) Resp:  [16-19] 17 (09/12 0354) BP: (134-147)/(91-96) 134/93 (09/12 0354) SpO2:  [95 %-99 %] 99 % (09/12 0354)     Height: 6' 1 (185.4 cm) Weight: 113.1 kg BMI (Calculated): 32.9   Intake/Output last 2 shifts:  09/11 0701 - 09/12 0700 In: 685 [P.O.:670] Out: 65 [Drains:65]   Physical Exam:  Constitutional: alert, cooperative and no distress  HENT: normocephalic without obvious abnormality  Eyes: PERRL, EOM's grossly intact and symmetric  Respiratory: breathing non-labored at rest  Cardiovascular: regular rate and sinus rhythm  Gastrointestinal: soft, previous tenderness is improved but still tender in lower abdomen; worse at drain site, non-distended, no rebound/guarding. He is not overtly peritonitic. Drain in LLQ; output serous today Musculoskeletal: no edema or wounds, motor and sensation grossly intact, NT    Labs:     Latest Ref Rng & Units 03/16/2024    7:34 AM 03/15/2024    3:29 AM 03/14/2024    3:29 AM  CBC  WBC 4.0 - 10.5 K/uL 9.4  9.1  9.2   Hemoglobin  13.0 - 17.0 g/dL 84.8  85.2  84.9   Hematocrit 39.0 - 52.0 % 43.9  41.6  42.4   Platelets 150 - 400 K/uL 171  144  153       Latest Ref Rng & Units 03/16/2024    7:34 AM 03/15/2024    3:29 AM 03/14/2024    3:29 AM  CMP  Glucose 70 - 99 mg/dL 92  899  98   BUN 6 - 20 mg/dL 7  9  14    Creatinine 0.61 - 1.24 mg/dL 9.31  9.15  9.31   Sodium 135 - 145 mmol/L 137  140  135   Potassium 3.5 - 5.1 mmol/L 3.7  3.6  3.6   Chloride 98 - 111 mmol/L 100  106  103   CO2 22 - 32 mmol/L 24  23  23    Calcium 8.9 - 10.3 mg/dL 8.8  8.7  8.8   Total Protein 6.5 - 8.1 g/dL  7.1    Total Bilirubin 0.0 - 1.2 mg/dL  1.6    Alkaline Phos 38 - 126 U/L  80    AST 15 - 41 U/L  39    ALT 0 - 44 U/L  31      Imaging studies: No new pertinent imaging studies   Assessment/Plan: 38 y.o. male with perforated diverticulitis   - Okay to continue soft/low fiber diet   - Continue drain; monitor and record output; appreciate IR assistance - okay to transition to passive drainage bag today   -  No need for surgical intervention. He understands if he clinically deteriorates we will need to proceed with surgical intervention and likely temporizing colostomy (ie: Hartmann's) - Continue IV Abx (Zosyn ) - follow up Cx from 09/10  - Monitor abdominal examination; on-going bowel function   - CIWA protocol  - Pain control prn; antiemetics prn  - Mobilize as tolerated   - Appreciate hospitalist assistance     - Discharge Planning: He is doing well, still with pain but otherwise clinically doing well. He will need to go with drain. He is homeless, asking CSW to assist in possible resources. We will at least keep him today.   All of the above findings and recommendations were discussed with the patient, and the medical team, and all of patient's questions were answered to his expressed satisfaction.  -- Arthea Platt, PA-C [facility] 03/18/2024, 7:14 AM M-F: 7am - 4pm

## 2024-03-18 NOTE — Plan of Care (Signed)

## 2024-03-19 DIAGNOSIS — K5720 Diverticulitis of large intestine with perforation and abscess without bleeding: Secondary | ICD-10-CM | POA: Diagnosis not present

## 2024-03-19 MED ORDER — CIPROFLOXACIN IN D5W 400 MG/200ML IV SOLN
400.0000 mg | Freq: Two times a day (BID) | INTRAVENOUS | Status: DC
  Administered 2024-03-19 – 2024-03-22 (×6): 400 mg via INTRAVENOUS
  Filled 2024-03-19 (×7): qty 200

## 2024-03-19 NOTE — Progress Notes (Signed)
 Progress Note    Christian Dickerson  FMW:[id number] DOB: 1986/02/24  DOA: 03/13/2024 PCP: Patient, No Pcp Per      Brief Narrative:    Medical records reviewed and are as summarized below:  Christian Dickerson is a 38 y.o. male with medical history significant for alcohol use disorder, tobacco use disorder, previous falls with fractures, ankle surgery, chest tube insertion, tibia fracture surgery, tonsillectomy and adenoidectomy, who presented to the hospital because of abdominal pain of about 4 days duration.    He was found to have acute diverticulitis with perforation.  He was admitted to the surgical service and the hospitalist team was consulted to assist with management of alcohol use disorder with concern for alcohol withdrawal syndrome.        Assessment/Plan:   Principal Problem:   Diverticulitis of colon with perforation Active Problems:   Alcoholism (HCC)   Alcoholic hepatitis    Body mass index is 32.9 kg/m.    Acute diverticulitis with perforation: Repeat CT abdomen and pelvis on 03/16/2024 showed severe proximal sigmoid diverticulitis and multiple fluid collections suggesting developing abscesses. S/p drain placement to left lower quadrant with evacuation of 40 mL foul-smelling purulent fluid on 03/16/2024. He is on a soft diet.  He remains on IV Zosyn .  Follow-up with general surgeon for further recommendations.   Hypokalemia: Improved   Hypertension: BP stable.  Continue amlodipine , started during this hospitalization.    Mildly elevated liver enzymes: Improved.  Likely due to alcohol use disorder.   Alcohol use disorder: Use IV Ativan  as needed per CIWA protocol.   Tobacco use disorder: Continue nicotine  patch     Diet Order             DIET SOFT Room service appropriate? Yes; Fluid consistency: Thin  Diet effective 1000                                  Consultants: General Surgeon Interventional  radiologist  Procedures: S/p drain placement to left lower quadrant with evacuation of 40 mL foul-smelling purulent fluid on 03/16/2024.    Medications:    amLODipine   5 mg Oral Daily   folic acid   1 mg Oral Daily   heparin   5,000 Units Subcutaneous Q8H   multivitamin with minerals  1 tablet Oral Daily   nicotine   21 mg Transdermal Daily   pantoprazole  (PROTONIX ) IV  40 mg Intravenous QHS   sodium chloride  flush  5 mL Intracatheter Q8H   thiamine   100 mg Oral Daily   Or   thiamine   100 mg Intravenous Daily   Continuous Infusions:  piperacillin -tazobactam (ZOSYN )  IV 3.375 g (03/19/24 0532)     Anti-infectives (From admission, onward)    Start     Dose/Rate Route Frequency Ordered Stop   03/14/24 0600  piperacillin -tazobactam (ZOSYN ) IVPB 3.375 g        3.375 g 12.5 mL/hr over 240 Minutes Intravenous Every 8 hours 03/14/24 0228 03/21/24 0559   03/13/24 1845  piperacillin -tazobactam (ZOSYN ) IVPB 3.375 g  Status:  Discontinued        3.375 g 12.5 mL/hr over 240 Minutes Intravenous Every 8 hours 03/13/24 1839 03/14/24 0228   03/13/24 1700  piperacillin -tazobactam (ZOSYN ) IVPB 3.375 g        3.375 g 100 mL/hr over 30 Minutes Intravenous  Once 03/13/24 1649 03/13/24 1750  Family Communication/Anticipated D/C date and plan/Code Status   DVT prophylaxis: heparin  injection 5,000 Units Start: 03/13/24 2200 SCDs Start: 03/13/24 1835     Code Status: Full Code  Family Communication: None Disposition Plan: Plan to discharge home   Status is: Inpatient Remains inpatient appropriate because: Severe diverticulitis       Subjective:   Interval events noted.  He complains of lower abdominal pain.  No other complaints.  Objective:    Vitals:   03/18/24 1807 03/18/24 2040 03/19/24 0411 03/19/24 0731  BP: (!) 135/106 127/86 (!) 144/97 (!) 136/96  Pulse: 95 68 69 61  Resp: 20 18  15   Temp: 98 F (36.7 C) 98.1 F (36.7 C) 97.8 F (36.6 C) 97.6 F  (36.4 C)  TempSrc:   Oral   SpO2: 94% 99% 97% 98%  Weight:      Height:       No data found.   Intake/Output Summary (Last 24 hours) at 03/19/2024 1209 Last data filed at 03/19/2024 1014 Gross per 24 hour  Intake 490 ml  Output 60 ml  Net 430 ml   Filed Weights   03/13/24 2356 03/16/24 1548  Weight: 113.1 kg 113.1 kg    Exam:  GEN: NAD SKIN: Warm and dry EYES: Anicteric ENT: MMM CV: RRR PULM: CTA B ABD: soft, ND, mild right lower quadrant tenderness, +BS, + JP drain with bloody fluid CNS: AAO x 3, non focal EXT: No edema or tenderness      Data Reviewed:   I have personally reviewed following labs and imaging studies:  Labs: Labs show the following:   Basic Metabolic Panel: Recent Labs  Lab 03/13/24 1412 03/14/24 0329 03/15/24 0329 03/16/24 0734  NA 137 135 140 137  K 3.2* 3.6 3.6 3.7  CL 99 103 106 100  CO2 19* 23 23 24   GLUCOSE 99 98 100* 92  BUN 13 14 9 7   CREATININE 0.95 0.68 0.84 0.68  CALCIUM 9.1 8.8* 8.7* 8.8*  MG  --   --  2.0  --    GFR Estimated Creatinine Clearance: 165 mL/min (by C-G formula based on SCr of 0.68 mg/dL). Liver Function Tests: Recent Labs  Lab 03/13/24 1412 03/15/24 0329  AST 42* 39  ALT 37 31  ALKPHOS 95 80  BILITOT 0.9 1.6*  PROT 8.2* 7.1  ALBUMIN 3.8 3.3*   Recent Labs  Lab 03/13/24 1412  LIPASE 54*   No results for input(s): AMMONIA in the last 168 hours. Coagulation profile Recent Labs  Lab 03/16/24 1223  INR 1.2    CBC: Recent Labs  Lab 03/13/24 1412 03/14/24 0329 03/15/24 0329 03/16/24 0734  WBC 11.7* 9.2 9.1 9.4  HGB 17.1* 15.0 14.7 15.1  HCT 47.6 42.4 41.6 43.9  MCV 100.0 102.7* 102.7* 103.3*  PLT 192 153 144* 171   Cardiac Enzymes: No results for input(s): CKTOTAL, CKMB, CKMBINDEX, TROPONINI in the last 168 hours. BNP (last 3 results) No results for input(s): PROBNP in the last 8760 hours. CBG: No results for input(s): GLUCAP in the last 168 hours. D-Dimer: No  results for input(s): DDIMER in the last 72 hours. Hgb A1c: No results for input(s): HGBA1C in the last 72 hours. Lipid Profile: No results for input(s): CHOL, HDL, LDLCALC, TRIG, CHOLHDL, LDLDIRECT in the last 72 hours. Thyroid function studies: No results for input(s): TSH, T4TOTAL, T3FREE, THYROIDAB in the last 72 hours.  Invalid input(s): FREET3 Anemia work up: No results for input(s): VITAMINB12, FOLATE, FERRITIN, TIBC,  IRON, RETICCTPCT in the last 72 hours. Sepsis Labs: Recent Labs  Lab 03/13/24 1412 03/13/24 1701 03/13/24 2002 03/14/24 0329 03/15/24 0329 03/16/24 0734  WBC 11.7*  --   --  9.2 9.1 9.4  LATICACIDVEN  --  1.4 1.2  --   --   --     Microbiology Recent Results (from the past 240 hours)  Blood Culture (routine x 2)     Status: None   Collection Time: 03/13/24  5:01 PM   Specimen: BLOOD LEFT HAND  Result Value Ref Range Status   Specimen Description BLOOD LEFT HAND  Final   Special Requests   Final    BOTTLES DRAWN AEROBIC AND ANAEROBIC Blood Culture results may not be optimal due to an inadequate volume of blood received in culture bottles   Culture   Final    NO GROWTH 5 DAYS Performed at [facility], [address] [place], [place] [address]    Report Status 03/18/2024 FINAL  Final  Blood Culture (routine x 2)     Status: None   Collection Time: 03/13/24  5:01 PM   Specimen: BLOOD  Result Value Ref Range Status   Specimen Description BLOOD RIGHT ANTECUBITAL  Final   Special Requests   Final    BOTTLES DRAWN AEROBIC AND ANAEROBIC Blood Culture results may not be optimal due to an inadequate volume of blood received in culture bottles   Culture   Final    NO GROWTH 5 DAYS Performed at [facility], [address], [place], [place] [address]    Report Status 03/18/2024 FINAL  Final  Aerobic/Anaerobic Culture w Gram Stain (surgical/deep wound)     Status: None (Preliminary result)    Collection Time: 03/16/24  4:59 PM   Specimen: Abscess  Result Value Ref Range Status   Specimen Description   Final    ABSCESS Performed at [facility], [address] [place], [place] [address]    Special Requests   Final    Normal Performed at [facility], [address] [place], [place] [address]    Gram Stain   Final    ABUNDANT WBC PRESENT, PREDOMINANTLY PMN FEW GRAM NEGATIVE RODS FEW GRAM POSITIVE COCCI FEW GRAM POSITIVE RODS Performed at [facility], [address], [place], [place] [address]    Culture ABUNDANT ESCHERICHIA COLI  Final   Report Status PENDING  Incomplete   Organism ID, Bacteria ESCHERICHIA COLI  Final      Susceptibility   Escherichia coli - MIC*    AMPICILLIN >=32 RESISTANT Resistant     CEFAZOLIN (NON-URINE) >=32 RESISTANT Resistant     CEFEPIME <=0.12 SENSITIVE Sensitive     ERTAPENEM <=0.12 SENSITIVE Sensitive     CEFTRIAXONE  8 RESISTANT Resistant     CIPROFLOXACIN  <=0.06 SENSITIVE Sensitive     GENTAMICIN <=1 SENSITIVE Sensitive     MEROPENEM <=0.25 SENSITIVE Sensitive     TRIMETH/SULFA <=20 SENSITIVE Sensitive     AMPICILLIN/SULBACTAM >=32 RESISTANT Resistant     PIP/TAZO Value in next row Resistant ug/mL     >=128 RESISTANTThis is a modified FDA-approved test that has been validated and its performance characteristics determined by the reporting laboratory.  This laboratory is certified under the Clinical Laboratory Improvement Amendments CLIA as qualified to perform high complexity clinical laboratory testing.    * ABUNDANT ESCHERICHIA COLI    Procedures and diagnostic studies:  No results found.  LOS: 6 days   Richelle Glick  [facility]   Pager on [email or web address]. If 7PM-7AM, please contact night-coverage at [email or web address]     03/19/2024, 12:09 PM

## 2024-03-19 NOTE — Plan of Care (Signed)

## 2024-03-19 NOTE — Progress Notes (Signed)
 [facility] SURGICAL PROGRESS NOTE (cpt [phone])  Hospital Day(s): 6.   Interval History: Continues to report some chills but no objective fevers.  Drain continues to be [employer].  Just having abdominal pain in the left lower quadrant. Review of Systems:  Constitutional: denies fever, chills  HEENT: denies cough or congestion  Respiratory: denies any shortness of breath  Cardiovascular: denies chest pain or palpitations  Gastrointestinal: + abdominal pain (improving), denied N/V Genitourinary: denies burning with urination or urinary frequency  Vital signs in last 24 hours: [min-max] current  Temp:  [97.6 F (36.4 C)-98.1 F (36.7 C)] 97.6 F (36.4 C) (09/13 0731) Pulse Rate:  [61-95] 61 (09/13 0731) Resp:  [15-20] 15 (09/13 0731) BP: (127-144)/(86-106) 136/96 (09/13 0731) SpO2:  [94 %-99 %] 98 % (09/13 0731)     Height: 6' 1 (185.4 cm) Weight: 113.1 kg BMI (Calculated): 32.9   Intake/Output last 2 shifts:  09/12 0701 - 09/13 0700 In: 370 [P.O.:360] Out: 60 [Drains:60]   Physical Exam:  Constitutional: alert, cooperative and no distress  HENT: normocephalic without obvious abnormality  Eyes: PERRL, EOM's grossly intact and symmetric  Respiratory: breathing non-labored at rest  Cardiovascular: regular rate and sinus rhythm  Gastrointestinal: soft, previous tenderness is improved, non-distended, no rebound/guarding. He is not overtly peritonitic. Drain in LLQ; output seropurulent  Musculoskeletal: no edema or wounds, motor and sensation grossly intact, NT    Labs:     Latest Ref Rng & Units 03/16/2024    7:34 AM 03/15/2024    3:29 AM 03/14/2024    3:29 AM  CBC  WBC 4.0 - 10.5 K/uL 9.4  9.1  9.2   Hemoglobin 13.0 - 17.0 g/dL 84.8  85.2  84.9   Hematocrit 39.0 - 52.0 % 43.9  41.6  42.4   Platelets 150 - 400 K/uL 171  144  153       Latest Ref Rng & Units 03/16/2024    7:34 AM 03/15/2024    3:29 AM 03/14/2024    3:29 AM  CMP  Glucose 70 - 99 mg/dL 92  899  98    BUN 6 - 20 mg/dL 7  9  14    Creatinine 0.61 - 1.24 mg/dL 9.31  9.15  9.31   Sodium 135 - 145 mmol/L 137  140  135   Potassium 3.5 - 5.1 mmol/L 3.7  3.6  3.6   Chloride 98 - 111 mmol/L 100  106  103   CO2 22 - 32 mmol/L 24  23  23    Calcium 8.9 - 10.3 mg/dL 8.8  8.7  8.8   Total Protein 6.5 - 8.1 g/dL  7.1    Total Bilirubin 0.0 - 1.2 mg/dL  1.6    Alkaline Phos 38 - 126 U/L  80    AST 15 - 41 U/L  39    ALT 0 - 44 U/L  31      Imaging studies: No new pertinent imaging studies   Assessment/Plan: 38 y.o. male with perforated diverticulitis   - Tolerating soft diet, continue  - Continue drain; monitor and record output; appreciate IR assistance.  Plan for repeat CT scan on Monday  - No need for surgical intervention. He understands if he clinically deteriorates we will need to proceed with surgical intervention and likely temporizing colostomy (ie: Hartmann's) - Continue IV Abx (Zosyn ) - follow up Cx from 09/10  - Monitor abdominal examination; on-going bowel function   - CIWA protocol  - Pain  control prn; antiemetics prn  - Mobilize as tolerated   - Appreciate hospitalist assistance    .  -- Jayson MALVA Endow,

## 2024-03-19 NOTE — Plan of Care (Signed)
  Problem: Education: Goal: Knowledge of General Education information will improve Description: Including pain rating scale, medication(s)/side effects and non-pharmacologic comfort measures Outcome: Progressing   Problem: Coping: Goal: Level of anxiety will decrease Outcome: Progressing   Problem: Safety: Goal: Ability to remain free from injury will improve Outcome: Progressing   Problem: Pain Managment: Goal: General experience of comfort will improve and/or be controlled Outcome: Progressing   Problem: Elimination: Goal: Will not experience complications related to bowel motility Outcome: Progressing Goal: Will not experience complications related to urinary retention Outcome: Progressing

## 2024-03-20 DIAGNOSIS — K5720 Diverticulitis of large intestine with perforation and abscess without bleeding: Secondary | ICD-10-CM | POA: Diagnosis not present

## 2024-03-20 LAB — AEROBIC/ANAEROBIC CULTURE W GRAM STAIN (SURGICAL/DEEP WOUND): Special Requests: NORMAL

## 2024-03-20 MED ORDER — NICOTINE 14 MG/24HR TD PT24
14.0000 mg | MEDICATED_PATCH | Freq: Every day | TRANSDERMAL | Status: DC
  Administered 2024-03-20 – 2024-03-22 (×3): 14 mg via TRANSDERMAL
  Filled 2024-03-20 (×3): qty 1

## 2024-03-20 NOTE — Progress Notes (Signed)
 Progress Note    Christian Dickerson  FMW:[id number] DOB: 1985/08/10  DOA: 03/13/2024 PCP: Patient, No Pcp Per      Brief Narrative:    Medical records reviewed and are as summarized below:  Christian Dickerson is a 38 y.o. male with medical history significant for alcohol use disorder, tobacco use disorder, previous falls with fractures, ankle surgery, chest tube insertion, tibia fracture surgery, tonsillectomy and adenoidectomy, who presented to the hospital because of abdominal pain of about 4 days duration.    He was found to have acute diverticulitis with perforation.  He was admitted to the surgical service and the hospitalist team was consulted to assist with management of alcohol use disorder with concern for alcohol withdrawal syndrome.        Assessment/Plan:   Principal Problem:   Diverticulitis of colon with perforation Active Problems:   Alcoholism (HCC)   Alcoholic hepatitis    Body mass index is 32.9 kg/m.    Acute diverticulitis with perforation: Repeat CT abdomen and pelvis on 03/16/2024 showed severe proximal sigmoid diverticulitis and multiple fluid collections suggesting developing abscesses. S/p drain placement to left lower quadrant with evacuation of 40 mL foul-smelling purulent fluid on 03/16/2024. Antibiotic was changed to IV ciprofloxacin . Plan for repeat CT abdomen pelvis tomorrow.  Follow-up with surgeon.     Hypokalemia: Improved   Hypertension: BP has improved.  Continue amlodipine  (started on this admission).  .   Mildly elevated liver enzymes: Improved.  Likely due to alcohol use disorder.   Alcohol use disorder: Use IV Ativan  as needed per CIWA protocol.   Tobacco use disorder: Continue nicotine  patch     Diet Order             DIET SOFT Room service appropriate? Yes; Fluid consistency: Thin  Diet effective 1000                                  Consultants: General Surgeon Interventional  radiologist  Procedures: S/p drain placement to left lower quadrant with evacuation of 40 mL foul-smelling purulent fluid on 03/16/2024.    Medications:    amLODipine   5 mg Oral Daily   folic acid   1 mg Oral Daily   heparin   5,000 Units Subcutaneous Q8H   multivitamin with minerals  1 tablet Oral Daily   nicotine   14 mg Transdermal Daily   pantoprazole  (PROTONIX ) IV  40 mg Intravenous QHS   sodium chloride  flush  5 mL Intracatheter Q8H   thiamine   100 mg Oral Daily   Or   thiamine   100 mg Intravenous Daily   Continuous Infusions:  ciprofloxacin  Stopped (03/20/24 0500)     Anti-infectives (From admission, onward)    Start     Dose/Rate Route Frequency Ordered Stop   03/19/24 1400  ciprofloxacin  (CIPRO ) IVPB 400 mg        400 mg 200 mL/hr over 60 Minutes Intravenous Every 12 hours 03/19/24 1308     03/14/24 0600  piperacillin -tazobactam (ZOSYN ) IVPB 3.375 g  Status:  Discontinued        3.375 g 12.5 mL/hr over 240 Minutes Intravenous Every 8 hours 03/14/24 0228 03/19/24 1308   03/13/24 1845  piperacillin -tazobactam (ZOSYN ) IVPB 3.375 g  Status:  Discontinued        3.375 g 12.5 mL/hr over 240 Minutes Intravenous Every 8 hours 03/13/24 1839 03/14/24 0228   03/13/24 1700  piperacillin -tazobactam (  ZOSYN ) IVPB 3.375 g        3.375 g 100 mL/hr over 30 Minutes Intravenous  Once 03/13/24 1649 03/13/24 1750              Family Communication/Anticipated D/C date and plan/Code Status   DVT prophylaxis: heparin  injection 5,000 Units Start: 03/13/24 2200 SCDs Start: 03/13/24 1835     Code Status: Full Code  Family Communication: None Disposition Plan: Plan to discharge home   Status is: Inpatient Remains inpatient appropriate because: Severe diverticulitis       Subjective:   Interval events noted.  He complains of watery stools and abdomen is sore .  He noticed that BP has normalized.  Objective:    Vitals:   03/19/24 1550 03/19/24 2006 03/20/24 0414  03/20/24 0800  BP: 125/73 132/85 126/81 (!) 130/101  Pulse: 66 71 68 62  Resp: 16   16  Temp: 97.6 F (36.4 C) 98.3 F (36.8 C) 97.8 F (36.6 C) 97.8 F (36.6 C)  TempSrc: Oral Oral    SpO2: 98% 100% 99% 97%  Weight:      Height:       No data found.   Intake/Output Summary (Last 24 hours) at 03/20/2024 1152 Last data filed at 03/20/2024 9386 Gross per 24 hour  Intake 640 ml  Output 25 ml  Net 615 ml   Filed Weights   03/13/24 2356 03/16/24 1548  Weight: 113.1 kg 113.1 kg    Exam:  GEN: NAD SKIN: Warm and dry EYES: No pallor or icterus ENT: MMM CV: RRR PULM: CTA B ABD: soft, ND, mild lower abdominal tenderness, +BS, + JP drain with some bloody fluid CNS: AAO x 3, non focal EXT: No edema or tenderness       Data Reviewed:   I have personally reviewed following labs and imaging studies:  Labs: Labs show the following:   Basic Metabolic Panel: Recent Labs  Lab 03/13/24 1412 03/14/24 0329 03/15/24 0329 03/16/24 0734  NA 137 135 140 137  K 3.2* 3.6 3.6 3.7  CL 99 103 106 100  CO2 19* 23 23 24   GLUCOSE 99 98 100* 92  BUN 13 14 9 7   CREATININE 0.95 0.68 0.84 0.68  CALCIUM 9.1 8.8* 8.7* 8.8*  MG  --   --  2.0  --    GFR Estimated Creatinine Clearance: 165 mL/min (by C-G formula based on SCr of 0.68 mg/dL). Liver Function Tests: Recent Labs  Lab 03/13/24 1412 03/15/24 0329  AST 42* 39  ALT 37 31  ALKPHOS 95 80  BILITOT 0.9 1.6*  PROT 8.2* 7.1  ALBUMIN 3.8 3.3*   Recent Labs  Lab 03/13/24 1412  LIPASE 54*   No results for input(s): AMMONIA in the last 168 hours. Coagulation profile Recent Labs  Lab 03/16/24 1223  INR 1.2    CBC: Recent Labs  Lab 03/13/24 1412 03/14/24 0329 03/15/24 0329 03/16/24 0734  WBC 11.7* 9.2 9.1 9.4  HGB 17.1* 15.0 14.7 15.1  HCT 47.6 42.4 41.6 43.9  MCV 100.0 102.7* 102.7* 103.3*  PLT 192 153 144* 171   Cardiac Enzymes: No results for input(s): CKTOTAL, CKMB, CKMBINDEX, TROPONINI in  the last 168 hours. BNP (last 3 results) No results for input(s): PROBNP in the last 8760 hours. CBG: No results for input(s): GLUCAP in the last 168 hours. D-Dimer: No results for input(s): DDIMER in the last 72 hours. Hgb A1c: No results for input(s): HGBA1C in the last 72 hours. Lipid  Profile: No results for input(s): CHOL, HDL, LDLCALC, TRIG, CHOLHDL, LDLDIRECT in the last 72 hours. Thyroid function studies: No results for input(s): TSH, T4TOTAL, T3FREE, THYROIDAB in the last 72 hours.  Invalid input(s): FREET3 Anemia work up: No results for input(s): VITAMINB12, FOLATE, FERRITIN, TIBC, IRON, RETICCTPCT in the last 72 hours. Sepsis Labs: Recent Labs  Lab 03/13/24 1412 03/13/24 1701 03/13/24 2002 03/14/24 0329 03/15/24 0329 03/16/24 0734  WBC 11.7*  --   --  9.2 9.1 9.4  LATICACIDVEN  --  1.4 1.2  --   --   --     Microbiology Recent Results (from the past 240 hours)  Blood Culture (routine x 2)     Status: None   Collection Time: 03/13/24  5:01 PM   Specimen: BLOOD LEFT HAND  Result Value Ref Range Status   Specimen Description BLOOD LEFT HAND  Final   Special Requests   Final    BOTTLES DRAWN AEROBIC AND ANAEROBIC Blood Culture results may not be optimal due to an inadequate volume of blood received in culture bottles   Culture   Final    NO GROWTH 5 DAYS Performed at [facility], [address] [place], [place] [address]    Report Status 03/18/2024 FINAL  Final  Blood Culture (routine x 2)     Status: None   Collection Time: 03/13/24  5:01 PM   Specimen: BLOOD  Result Value Ref Range Status   Specimen Description BLOOD RIGHT ANTECUBITAL  Final   Special Requests   Final    BOTTLES DRAWN AEROBIC AND ANAEROBIC Blood Culture results may not be optimal due to an inadequate volume of blood received in culture bottles   Culture   Final    NO GROWTH 5 DAYS Performed at [facility], [address], [place], [place] [address]    Report Status 03/18/2024 FINAL  Final  Aerobic/Anaerobic Culture w Gram Stain (surgical/deep wound)     Status: None (Preliminary result)   Collection Time: 03/16/24  4:59 PM   Specimen: Abscess  Result Value Ref Range Status   Specimen Description   Final    ABSCESS Performed at [facility], [address], [place], [place] [address]    Special Requests   Final    Normal Performed at [facility], [address] [place], [place] [address]    Gram Stain   Final    ABUNDANT WBC PRESENT, PREDOMINANTLY PMN FEW GRAM NEGATIVE RODS FEW GRAM POSITIVE COCCI FEW GRAM POSITIVE RODS    Culture   Final    ABUNDANT ESCHERICHIA COLI HOLDING FOR POSSIBLE ANAEROBE Performed at [facility], [address] [place], [place] [address]    Report Status PENDING  Incomplete   Organism ID, Bacteria ESCHERICHIA COLI  Final      Susceptibility   Escherichia coli - MIC*    AMPICILLIN >=32 RESISTANT Resistant     CEFAZOLIN (NON-URINE) >=32 RESISTANT Resistant     CEFEPIME <=0.12 SENSITIVE Sensitive     ERTAPENEM <=0.12 SENSITIVE Sensitive     CEFTRIAXONE  8 RESISTANT Resistant     CIPROFLOXACIN  <=0.06 SENSITIVE Sensitive     GENTAMICIN <=1 SENSITIVE Sensitive     MEROPENEM <=0.25 SENSITIVE Sensitive     TRIMETH/SULFA <=20 SENSITIVE Sensitive     AMPICILLIN/SULBACTAM >=32 RESISTANT Resistant     PIP/TAZO Value in next row Resistant ug/mL     >=128 RESISTANTThis is a modified FDA-approved test that has been validated  and its performance characteristics determined by the reporting laboratory.  This laboratory is certified under the Clinical Laboratory Improvement Amendments CLIA as qualified to perform high complexity clinical laboratory testing.    * ABUNDANT ESCHERICHIA COLI    Procedures and diagnostic studies:  No results found.              LOS: 7 days   Takita Riecke  [facility]   Pager on [email or web address]. If  7PM-7AM, please contact night-coverage at [email or web address]     03/20/2024, 11:52 AM

## 2024-03-20 NOTE — Plan of Care (Signed)

## 2024-03-20 NOTE — Plan of Care (Signed)

## 2024-03-20 NOTE — Progress Notes (Signed)
 [facility] SURGICAL PROGRESS NOTE (cpt [phone])  Hospital Day(s): 7.   Interval History: Continues to report some chills but no objective fevers.  Drain more serosanguineous.  Antibiotics changed to something sensitive to cultures. Review of Systems:  Constitutional: denies fever, chills  HEENT: denies cough or congestion  Respiratory: denies any shortness of breath  Cardiovascular: denies chest pain or palpitations  Gastrointestinal: + abdominal pain (improving), denied N/V Genitourinary: denies burning with urination or urinary frequency  Vital signs in last 24 hours: [min-max] current  Temp:  [97.6 F (36.4 C)-98.3 F (36.8 C)] 97.8 F (36.6 C) (09/14 0800) Pulse Rate:  [62-71] 62 (09/14 0800) Resp:  [16] 16 (09/14 0800) BP: (125-132)/(73-101) 130/101 (09/14 0800) SpO2:  [97 %-100 %] 97 % (09/14 0800)     Height: 6' 1 (185.4 cm) Weight: 113.1 kg BMI (Calculated): 32.9   Intake/Output last 2 shifts:  09/13 0701 - 09/14 0700 In: 880 [P.O.:480; IV Piggyback:400] Out: 25 [Drains:25]   Physical Exam:  Constitutional: alert, cooperative and no distress  HENT: normocephalic without obvious abnormality  Eyes: PERRL, EOM's grossly intact and symmetric  Respiratory: breathing non-labored at rest  Cardiovascular: regular rate and sinus rhythm  Gastrointestinal: soft, previous tenderness is improved, non-distended, no rebound/guarding. He is not overtly peritonitic. Drain in LLQ; output serosanguineous Musculoskeletal: no edema or wounds, motor and sensation grossly intact, NT    Labs:     Latest Ref Rng & Units 03/16/2024    7:34 AM 03/15/2024    3:29 AM 03/14/2024    3:29 AM  CBC  WBC 4.0 - 10.5 K/uL 9.4  9.1  9.2   Hemoglobin 13.0 - 17.0 g/dL 84.8  85.2  84.9   Hematocrit 39.0 - 52.0 % 43.9  41.6  42.4   Platelets 150 - 400 K/uL 171  144  153       Latest Ref Rng & Units 03/16/2024    7:34 AM 03/15/2024    3:29 AM 03/14/2024    3:29 AM  CMP  Glucose 70 - 99  mg/dL 92  899  98   BUN 6 - 20 mg/dL 7  9  14    Creatinine 0.61 - 1.24 mg/dL 9.31  9.15  9.31   Sodium 135 - 145 mmol/L 137  140  135   Potassium 3.5 - 5.1 mmol/L 3.7  3.6  3.6   Chloride 98 - 111 mmol/L 100  106  103   CO2 22 - 32 mmol/L 24  23  23    Calcium 8.9 - 10.3 mg/dL 8.8  8.7  8.8   Total Protein 6.5 - 8.1 g/dL  7.1    Total Bilirubin 0.0 - 1.2 mg/dL  1.6    Alkaline Phos 38 - 126 U/L  80    AST 15 - 41 U/L  39    ALT 0 - 44 U/L  31      Imaging studies: No new pertinent imaging studies   Assessment/Plan: 38 y.o. male with perforated diverticulitis   - Tolerating soft diet, continue  - Continue drain; monitor and record output; appreciate IR assistance.  Plan for repeat CT scan on Monday  - No need for surgical intervention. He understands if he clinically deteriorates we will need to proceed with surgical intervention and likely temporizing colostomy (ie: Hartmann's) - Continue IV Abx   - Monitor abdominal examination; on-going bowel function   - CIWA protocol  - Pain control prn; antiemetics prn  - Mobilize as tolerated   -  Appreciate hospitalist assistance    .  -- Jayson MALVA Endow,

## 2024-03-21 ENCOUNTER — Inpatient Hospital Stay

## 2024-03-21 DIAGNOSIS — K5720 Diverticulitis of large intestine with perforation and abscess without bleeding: Secondary | ICD-10-CM | POA: Diagnosis not present

## 2024-03-21 MED ORDER — SODIUM CHLORIDE 0.9% FLUSH: 10.0000 mL | INTRAVENOUS | Status: DC | PRN

## 2024-03-21 MED ORDER — METRONIDAZOLE 500 MG/100ML IV SOLN
500.0000 mg | Freq: Two times a day (BID) | INTRAVENOUS | Status: DC
  Administered 2024-03-21 – 2024-03-22 (×3): 500 mg via INTRAVENOUS
  Filled 2024-03-21 (×4): qty 100

## 2024-03-21 MED ORDER — SODIUM CHLORIDE 0.9% FLUSH
10.0000 mL | Freq: Two times a day (BID) | INTRAVENOUS | Status: DC
  Administered 2024-03-21 – 2024-03-22 (×3): 10 mL

## 2024-03-21 MED ORDER — IOHEXOL 300 MG/ML  SOLN
100.0000 mL | Freq: Once | INTRAMUSCULAR | Status: AC | PRN
Start: 2024-03-21 — End: 2024-03-21
  Administered 2024-03-21: 100 mL via INTRAVENOUS

## 2024-03-21 MED ORDER — HYDROMORPHONE HCL 1 MG/ML IJ SOLN
1.0000 mg | INTRAMUSCULAR | Status: DC | PRN
  Administered 2024-03-21 – 2024-03-22 (×4): 1 mg via INTRAVENOUS
  Filled 2024-03-21 (×3): qty 1

## 2024-03-21 NOTE — Progress Notes (Signed)
  [facility] (cpt [phone])  Hospital Day(s): 8.   Interval History: Patient seen and examined, no acute events or new complaints overnight. Patient reports he still gets lower abdominal which seems to be worse in the evening. Still with night sweats. No fever, chills, nausea, emesis. Cx from drain with E coli; switched to Cipro . Drain output 25 ccs; still murky appearing. He has been on soft diet without issue.   Review of Systems:  Constitutional: denies fever, chills  HEENT: denies cough or congestion  Respiratory: denies any shortness of breath  Cardiovascular: denies chest pain or palpitations  Gastrointestinal: + abdominal pain, denied N/V Genitourinary: denies burning with urination or urinary frequency  Vital signs in last 24 hours: [min-max] current  Temp:  [97.5 F (36.4 C)-98.6 F (37 C)] 97.5 F (36.4 C) (09/15 0735) Pulse Rate:  [64-69] 64 (09/15 0735) Resp:  [16-18] 16 (09/15 0735) BP: (126-131)/(82-100) 131/100 (09/15 0735) SpO2:  [97 %-100 %] 100 % (09/15 0735)     Height: 6' 1 (185.4 cm) Weight: 113.1 kg BMI (Calculated): 32.9   Intake/Output last 2 shifts:  09/14 0701 - 09/15 0700 In: 680 [P.O.:480; IV Piggyback:200] Out: 25 [Drains:25]   Physical Exam:  Constitutional: alert, cooperative and no distress  HENT: normocephalic without obvious abnormality  Eyes: PERRL, EOM's grossly intact and symmetric  Respiratory: breathing non-labored at rest  Cardiovascular: regular rate and sinus rhythm  Gastrointestinal: soft, previous tenderness is improved but still tender in lower abdomen; worse at drain site, non-distended, no rebound/guarding. He is not overtly peritonitic. Drain in LLQ; output still murky appearing.  Musculoskeletal: no edema or wounds, motor and sensation grossly intact, NT    Labs:     Latest Ref Rng & Units 03/16/2024    7:34 AM 03/15/2024    3:29 AM 03/14/2024    3:29 AM  CBC  WBC 4.0 - 10.5 K/uL 9.4  9.1   9.2   Hemoglobin 13.0 - 17.0 g/dL 84.8  85.2  84.9   Hematocrit 39.0 - 52.0 % 43.9  41.6  42.4   Platelets 150 - 400 K/uL 171  144  153       Latest Ref Rng & Units 03/16/2024    7:34 AM 03/15/2024    3:29 AM 03/14/2024    3:29 AM  CMP  Glucose 70 - 99 mg/dL 92  899  98   BUN 6 - 20 mg/dL 7  9  14    Creatinine 0.61 - 1.24 mg/dL 9.31  9.15  9.31   Sodium 135 - 145 mmol/L 137  140  135   Potassium 3.5 - 5.1 mmol/L 3.7  3.6  3.6   Chloride 98 - 111 mmol/L 100  106  103   CO2 22 - 32 mmol/L 24  23  23    Calcium 8.9 - 10.3 mg/dL 8.8  8.7  8.8   Total Protein 6.5 - 8.1 g/dL  7.1    Total Bilirubin 0.0 - 1.2 mg/dL  1.6    Alkaline Phos 38 - 126 U/L  80    AST 15 - 41 U/L  39    ALT 0 - 44 U/L  31      Imaging studies: No new pertinent imaging studies   Assessment/Plan: 38 y.o. male with perforated diverticulitis   - Plan for repeat CT Abdomen/Pelvis today to reassess intra-abdominal process, potential drain removal although I am doubtful of this given appearance of fluid.    - Okay  to continue soft/low fiber diet for now   - Continue drain; monitor and record output; appreciate IR assistance - okay to transition to passive drainage bag   - No need for surgical intervention. He understands if he clinically deteriorates we will need to proceed with surgical intervention and likely temporizing colostomy (ie: Hartmann's) - Continue ABx; He is on Cipro , will add flagyl  today per pharmacy recommendations   - Monitor abdominal examination; on-going bowel function   - CIWA protocol  - Pain control prn; antiemetics prn  - Mobilize as tolerated   - Appreciate hospitalist assistance     - Discharge Planning: His biggest barrier to discharge is socioeconomic issues (homeless) and is unwilling to go with drain, which is understandable. We will reassess after completion of CT today.   All of the above findings and recommendations were discussed with the patient, and the medical team, and all of  patient's questions were answered to his expressed satisfaction.  -- Arthea Platt, PA-C [facility] 03/21/2024, 8:10 AM M-F: 7am - 4pm

## 2024-03-21 NOTE — Plan of Care (Signed)
   Problem: Education: Goal: Knowledge of General Education information will improve Description: Including pain rating scale, medication(s)/side effects and non-pharmacologic comfort measures Outcome: Progressing   Problem: Clinical Measurements: Goal: Ability to maintain clinical measurements within normal limits will improve Outcome: Progressing Goal: Will remain free from infection Outcome: Progressing

## 2024-03-21 NOTE — Progress Notes (Signed)

## 2024-03-21 NOTE — Progress Notes (Signed)
 Progress Note    Christian Dickerson  FMW:[id number] DOB: 09/14/1985  DOA: 03/13/2024 PCP: Patient, No Pcp Per      Brief Narrative:    Medical records reviewed and are as summarized below:  Christian Dickerson is a 38 y.o. male with medical history significant for alcohol use disorder, tobacco use disorder, previous falls with fractures, ankle surgery, chest tube insertion, tibia fracture surgery, tonsillectomy and adenoidectomy, who presented to the hospital because of abdominal pain of about 4 days duration.    He was found to have acute diverticulitis with perforation.  He was admitted to the surgical service and the hospitalist team was consulted to assist with management of alcohol use disorder with concern for alcohol withdrawal syndrome.        Assessment/Plan:   Principal Problem:   Diverticulitis of colon with perforation Active Problems:   Alcoholism (HCC)   Alcoholic hepatitis    Body mass index is 32.9 kg/m.    Acute diverticulitis with perforation: Repeat CT abdomen and pelvis on 03/21/2024 showed improvement in abdominal abscesses. S/p drain placement to left lower quadrant with evacuation of 40 mL foul-smelling purulent fluid on 03/16/2024. He is on IV ciprofloxacin  and IV Flagyl . Analgesics as needed for pain.  Follow-up with surgeon for further recommendations.     Hypokalemia: Improved   Hypertension: BP has improved.  Continue amlodipine  (started on this admission).  .   Mildly elevated liver enzymes: Improved.  Likely due to alcohol use disorder.   Alcohol use disorder: Use IV Ativan  as needed per CIWA protocol.   Tobacco use disorder: Continue nicotine  patch     Diet Order             DIET SOFT Room service appropriate? Yes; Fluid consistency: Thin  Diet effective 1000                                  Consultants: General Surgeon Interventional radiologist  Procedures: S/p drain placement to left lower  quadrant with evacuation of 40 mL foul-smelling purulent fluid on 03/16/2024.    Medications:    amLODipine   5 mg Oral Daily   folic acid   1 mg Oral Daily   heparin   5,000 Units Subcutaneous Q8H   multivitamin with minerals  1 tablet Oral Daily   nicotine   14 mg Transdermal Daily   pantoprazole  (PROTONIX ) IV  40 mg Intravenous QHS   sodium chloride  flush  10-40 mL Intracatheter Q12H   sodium chloride  flush  5 mL Intracatheter Q8H   thiamine   100 mg Oral Daily   Or   thiamine   100 mg Intravenous Daily   Continuous Infusions:  ciprofloxacin  400 mg (03/21/24 1305)   metronidazole  500 mg (03/21/24 0920)     Anti-infectives (From admission, onward)    Start     Dose/Rate Route Frequency Ordered Stop   03/21/24 0900  metroNIDAZOLE  (FLAGYL ) IVPB 500 mg        500 mg 100 mL/hr over 60 Minutes Intravenous Every 12 hours 03/21/24 0811     03/19/24 1400  ciprofloxacin  (CIPRO ) IVPB 400 mg        400 mg 200 mL/hr over 60 Minutes Intravenous Every 12 hours 03/19/24 1308     03/14/24 0600  piperacillin -tazobactam (ZOSYN ) IVPB 3.375 g  Status:  Discontinued        3.375 g 12.5 mL/hr over 240 Minutes Intravenous Every 8 hours  03/14/24 0228 03/19/24 1308   03/13/24 1845  piperacillin -tazobactam (ZOSYN ) IVPB 3.375 g  Status:  Discontinued        3.375 g 12.5 mL/hr over 240 Minutes Intravenous Every 8 hours 03/13/24 1839 03/14/24 0228   03/13/24 1700  piperacillin -tazobactam (ZOSYN ) IVPB 3.375 g        3.375 g 100 mL/hr over 30 Minutes Intravenous  Once 03/13/24 1649 03/13/24 1750              Family Communication/Anticipated D/C date and plan/Code Status   DVT prophylaxis: heparin  injection 5,000 Units Start: 03/13/24 2200 SCDs Start: 03/13/24 1835     Code Status: Full Code  Family Communication: None Disposition Plan: Plan to discharge home   Status is: Inpatient Remains inpatient appropriate because: Severe diverticulitis       Subjective:   No acute events  overnight.  Abdomen is sore.  No other complaints.  Objective:    Vitals:   03/20/24 1928 03/21/24 0411 03/21/24 0735 03/21/24 1532  BP: 126/82 (!) 130/96 (!) 131/100 110/71  Pulse: 68 68 64 71  Resp: 16 18 16 17   Temp: 98.6 F (37 C) 97.7 F (36.5 C) (!) 97.5 F (36.4 C) 98.2 F (36.8 C)  TempSrc: Oral Oral Oral Oral  SpO2: 98% 97% 100% 98%  Weight:      Height:       No data found.   Intake/Output Summary (Last 24 hours) at 03/21/2024 1655 Last data filed at 03/21/2024 1311 Gross per 24 hour  Intake 965 ml  Output 30 ml  Net 935 ml   Filed Weights   03/13/24 2356 03/16/24 1548  Weight: 113.1 kg 113.1 kg    Exam:  GEN: NAD SKIN: Warm and dry EYES: No pallor or icterus ENT: MMM CV: RRR PULM: CTA B ABD: soft, ND, NT, +BS, + JP drain with small amount of bloody fluid CNS: AAO x 3, non focal EXT: No edema or tenderness        Data Reviewed:   I have personally reviewed following labs and imaging studies:  Labs: Labs show the following:   Basic Metabolic Panel: Recent Labs  Lab 03/15/24 0329 03/16/24 0734  NA 140 137  K 3.6 3.7  CL 106 100  CO2 23 24  GLUCOSE 100* 92  BUN 9 7  CREATININE 0.84 0.68  CALCIUM 8.7* 8.8*  MG 2.0  --    GFR Estimated Creatinine Clearance: 165 mL/min (by C-G formula based on SCr of 0.68 mg/dL). Liver Function Tests: Recent Labs  Lab 03/15/24 0329  AST 39  ALT 31  ALKPHOS 80  BILITOT 1.6*  PROT 7.1  ALBUMIN 3.3*   No results for input(s): LIPASE, AMYLASE in the last 168 hours.  No results for input(s): AMMONIA in the last 168 hours. Coagulation profile Recent Labs  Lab 03/16/24 1223  INR 1.2    CBC: Recent Labs  Lab 03/15/24 0329 03/16/24 0734  WBC 9.1 9.4  HGB 14.7 15.1  HCT 41.6 43.9  MCV 102.7* 103.3*  PLT 144* 171   Cardiac Enzymes: No results for input(s): CKTOTAL, CKMB, CKMBINDEX, TROPONINI in the last 168 hours. BNP (last 3 results) No results for input(s): PROBNP  in the last 8760 hours. CBG: No results for input(s): GLUCAP in the last 168 hours. D-Dimer: No results for input(s): DDIMER in the last 72 hours. Hgb A1c: No results for input(s): HGBA1C in the last 72 hours. Lipid Profile: No results for input(s): CHOL, HDL, LDLCALC,  TRIG, CHOLHDL, LDLDIRECT in the last 72 hours. Thyroid function studies: No results for input(s): TSH, T4TOTAL, T3FREE, THYROIDAB in the last 72 hours.  Invalid input(s): FREET3 Anemia work up: No results for input(s): VITAMINB12, FOLATE, FERRITIN, TIBC, IRON, RETICCTPCT in the last 72 hours. Sepsis Labs: Recent Labs  Lab 03/15/24 0329 03/16/24 0734  WBC 9.1 9.4    Microbiology Recent Results (from the past 240 hours)  Blood Culture (routine x 2)     Status: None   Collection Time: 03/13/24  5:01 PM   Specimen: BLOOD LEFT HAND  Result Value Ref Range Status   Specimen Description BLOOD LEFT HAND  Final   Special Requests   Final    BOTTLES DRAWN AEROBIC AND ANAEROBIC Blood Culture results may not be optimal due to an inadequate volume of blood received in culture bottles   Culture   Final    NO GROWTH 5 DAYS Performed at [facility], [address] [place], [place] [address]    Report Status 03/18/2024 FINAL  Final  Blood Culture (routine x 2)     Status: None   Collection Time: 03/13/24  5:01 PM   Specimen: BLOOD  Result Value Ref Range Status   Specimen Description BLOOD RIGHT ANTECUBITAL  Final   Special Requests   Final    BOTTLES DRAWN AEROBIC AND ANAEROBIC Blood Culture results may not be optimal due to an inadequate volume of blood received in culture bottles   Culture   Final    NO GROWTH 5 DAYS Performed at [facility], [address], [place], [place] [address]    Report Status 03/18/2024 FINAL  Final  Aerobic/Anaerobic Culture w Gram Stain (surgical/deep wound)     Status: None   Collection Time: 03/16/24  4:59 PM   Specimen:  Abscess  Result Value Ref Range Status   Specimen Description   Final    ABSCESS Performed at [facility], [address], [place], [place] [address]    Special Requests   Final    Normal Performed at [facility], [address] [place], [place] [address]    Gram Stain   Final    ABUNDANT WBC PRESENT, PREDOMINANTLY PMN FEW GRAM NEGATIVE RODS FEW GRAM POSITIVE COCCI FEW GRAM POSITIVE RODS    Culture   Final    ABUNDANT ESCHERICHIA COLI ABUNDANT BACTEROIDES SPECIES BETA LACTAMASE NEGATIVE ABUNDANT BACTEROIDES THETAIOTAOMICRON BETA LACTAMASE POSITIVE Performed at [facility], [address] [place], [place] [address]    Report Status 03/20/2024 FINAL  Final   Organism ID, Bacteria ESCHERICHIA COLI  Final      Susceptibility   Escherichia coli - MIC*    AMPICILLIN >=32 RESISTANT Resistant     CEFAZOLIN (NON-URINE) >=32 RESISTANT Resistant     CEFEPIME <=0.12 SENSITIVE Sensitive     ERTAPENEM <=0.12 SENSITIVE Sensitive     CEFTRIAXONE  8 RESISTANT Resistant     CIPROFLOXACIN  <=0.06 SENSITIVE Sensitive     GENTAMICIN <=1 SENSITIVE Sensitive     MEROPENEM <=0.25 SENSITIVE Sensitive     TRIMETH/SULFA <=20 SENSITIVE Sensitive     AMPICILLIN/SULBACTAM >=32 RESISTANT Resistant     PIP/TAZO Value in next row Resistant ug/mL     >=128 RESISTANTThis is a modified FDA-approved test that has been validated and its performance characteristics determined by the reporting laboratory.  This laboratory is certified under the Clinical Laboratory Improvement Amendments CLIA as qualified to perform high complexity clinical laboratory testing.    *  ABUNDANT ESCHERICHIA COLI    Procedures and diagnostic studies:  CT ABDOMEN PELVIS W CONTRAST Result Date: 03/21/2024 CLINICAL DATA:  Follow-up perforated diverticulitis status post drain placement EXAM: CT ABDOMEN AND PELVIS WITH CONTRAST TECHNIQUE: Multidetector CT imaging of the abdomen and pelvis was performed using  the standard protocol following bolus administration of intravenous contrast. RADIATION DOSE REDUCTION: This exam was performed according to the departmental dose-optimization program which includes automated exposure control, adjustment of the mA and/or kV according to patient size and/or use of iterative reconstruction technique. CONTRAST:  OMNIPAQUE  IOHEXOL  300 MG/ML  SOLN COMPARISON:  CT percutaneous drainage and CT abdomen and pelvis dated 03/16/2024 FINDINGS: Lower chest: Unchanged scarring along the lateral right middle lobe. No pleural effusion or pneumothorax demonstrated. Partially imaged heart size is normal. Coronary artery calcification along the LAD. Hepatobiliary: Subcentimeter segment 6 hypodensity (2:40), too small to characterize. No intra or extrahepatic biliary ductal dilation. Normal gallbladder. Pancreas: No focal lesions or main ductal dilation. Spleen: Enlarged spleen measures 14.9 cm, not substantially changed. Adrenals/Urinary Tract: No adrenal nodules. No suspicious renal mass, calculi or hydronephrosis. No focal bladder wall thickening. Stomach/Bowel: Normal appearance of the stomach. Short-segment circumferential mural thickening of the sigmoid colon adjacent to the diverticulitis. A 1.7 x 1.1 cm outpouching along the posterior aspect (2:84). Normal appendix. Vascular/Lymphatic: No significant vascular findings are present. Unchanged prominent 12 mm portacaval lymph node (2:39). Reproductive: Prostate is unremarkable. Other: Left lateral lower quadrant approach percutaneous drainage catheter is unchanged in position. No discretely measurable fluid collection remains at the tip of the catheter. There are residual mesenteric stranding and punctate foci of gas. Musculoskeletal: No acute or abnormal lytic or blastic osseous lesions. Multilevel degenerative changes of the partially imaged thoracic and lumbar spine. Small fat-containing right inguinal hernia. A few foci of left lower  quadrant subcutaneous emphysema anterior and inferior to the percutaneous drainage catheter. IMPRESSION: 1. Left lateral lower quadrant approach percutaneous drainage catheter is unchanged in position. No discretely measurable fluid collection remains at the tip of the catheter. There are residual mesenteric stranding and punctate foci of gas. 2. Short-segment circumferential mural thickening of the sigmoid colon adjacent to the diverticulitis. A 1.7 x 1.1 cm outpouching along the posterior aspect of the sigmoid colon may represent the inflamed diverticulum versus contained perforation. 3. Unchanged splenomegaly. 4. Unchanged prominent 12 mm portacaval lymph node, likely reactive. 5. Coronary artery calcification along the LAD. Assessment for potential risk factor modification, dietary therapy or pharmacologic therapy may be warranted, if clinically indicated. Electronically Signed   By: Limin  Xu M.D.   On: 03/21/2024 13:27                LOS: 8 days   Chauncey Bruno  [facility]   Pager on [email or web address]. If 7PM-7AM, please contact night-coverage at [email or web address]     03/21/2024, 4:55 PM

## 2024-03-21 NOTE — Plan of Care (Signed)

## 2024-03-22 ENCOUNTER — Other Ambulatory Visit: Payer: Self-pay

## 2024-03-22 MED ORDER — CIPROFLOXACIN HCL 500 MG PO TABS
500.0000 mg | ORAL_TABLET | Freq: Two times a day (BID) | ORAL | 0 refills | Status: AC
Start: 2024-03-22 — End: 2024-03-29
  Filled 2024-03-22: qty 14, 7d supply, fill #0

## 2024-03-22 MED ORDER — METRONIDAZOLE 500 MG PO TABS
500.0000 mg | ORAL_TABLET | Freq: Two times a day (BID) | ORAL | 0 refills | Status: AC
  Filled 2024-03-22: qty 14, 7d supply, fill #0

## 2024-03-22 NOTE — TOC Transition Note (Signed)
 Transition of Care [facility]) - Discharge Note   Patient Details  Name: Christian Dickerson MRN: [id number] Date of Birth: [birth year]  Transition of Care [facility] CM/SW Contact:  Christian ONEIDA Haddock, RN Phone Number: 03/22/2024, 1:04 PM   Clinical Narrative:     [name] was removed today Meds to Bed to provide discharge medications All needed recourses were included on AVS  Per PA patient to contact brother to transport for discharge        Patient Goals and CMS Choice            Discharge Placement                       Discharge Plan and Services Additional resources added to the After Visit Summary for                                       Social Drivers of Health (SDOH) Interventions SDOH Screenings   Food Insecurity: Food Insecurity Present (03/13/2024)  Housing: Low Risk  (03/13/2024)  Transportation Needs: No Transportation Needs (03/13/2024)  Utilities: Not At Risk (03/13/2024)  Tobacco Use: High Risk (03/13/2024)     Readmission Risk Interventions     No data to display

## 2024-03-22 NOTE — Discharge Summary (Signed)
 [facility] SURGICAL DISCHARGE SUMMARY (cpt: [phone])  Patient ID: Christian Dickerson MRN: [id number] DOB/AGE: 1986/02/25 38 y.o.  Admit date: 03/13/2024 Discharge date: 03/22/2024  Discharge Diagnoses Patient Active Problem List   Diagnosis Date Noted   Alcoholic hepatitis 03/14/2024   Diverticulitis of colon with perforation 03/13/2024   Alcoholism (HCC) 03/13/2024    Consultants Medicine Interventional Radiology  Procedures 03/17/2024:  Percutaneous Drainage  HPI: Christian Dickerson is a 38 y.o. male with pain beginning approximately 4 days ago.  Patient indicates having left lower quadrant abdominal pain which, he thought initially was a kidney stone.  He reports he is always had problems using the bathroom.  He drank a fair amount of beer with the attempt of washing out the kidney stone, had some bowel activity showing some blood mixed with stool.  The pain was significant and kept him from sleep tossing and turning with recurrences of the pain.  He had hot sweats and chills at home.  And finally his sister from [place]  came, and got him to the hospital.   Hospital Course: Patient was admitted to the general surgery service and attempts at conservative management were initiated. He did reasonably well the first hospital days with resolution in his leukocytosis. Diet was advanced. He did have repeat CT Abdomen/Pelvis on 09/10 which showed continued fluid collection adjacent to area of diverticulitis. Interventional radiology was consulted and he underwent percutaneous drain placement on 09/11. He did well following this. Unfortunately, he does face socioeconomic barriers as outpatient and is homeless so he was kept until resolution of his infectious process. He hand repeat CT Abdomen/Pelvis on 09/15 which showed resolution in this abscess. His drain output remained low. Drain was pulled on 09/16. The remainder of patient's hospital course was essentially unremarkable, and  discharge planning was initiated accordingly with patient safely able to be discharged home with appropriate discharge instructions, antibiotics (Cipro  & Flagyl  x7 days), pain control, and outpatient follow-up after all of his questions were answered to his expressed satisfaction.   Discharge Condition: Good   Physical Examination:  Constitutional: alert, cooperative and no distress  HENT: normocephalic without obvious abnormality  Eyes: PERRL, EOM's grossly intact and symmetric  Respiratory: breathing non-labored at rest  Cardiovascular: regular rate and sinus rhythm  Gastrointestinal: soft, previous tenderness is improved significantly, non-distended, no rebound/guarding. He is not overtly peritonitic. Drain in LLQ; output thinning (REMOVED).  Musculoskeletal: no edema or wounds, motor and sensation grossly intact, NT    Allergies as of 03/22/2024   No Known Allergies      Medication List     TAKE these medications    ciprofloxacin  500 MG tablet Commonly known as: Cipro  Take 1 tablet (500 mg total) by mouth 2 (two) times daily for 7 days.   metroNIDAZOLE  500 MG tablet Commonly known as: FLAGYL  Take 1 tablet (500 mg total) by mouth 2 (two) times daily for 7 days.          Follow-up Information     Lane Shope, MD. Go on 04/12/2024.   Specialty: General Surgery Why: Diverticulitis Contact information: [address] [place] [address] [phone]                  Time spent on discharge management including discussion of hospital course, clinical condition, outpatient instructions, prescriptions, and follow up with the patient and members of the medical team: >30 minutes  -- Arthea Platt , PA-C [facility]  03/22/2024, 12:25 PM [phone]  M-F: 7am - 4pm

## 2024-03-31 ENCOUNTER — Other Ambulatory Visit: Payer: Self-pay

## 2024-03-31 ENCOUNTER — Telehealth: Payer: Self-pay | Admitting: Surgery

## 2024-03-31 ENCOUNTER — Inpatient Hospital Stay
Admission: EM | Admit: 2024-03-31 | Discharge: 2024-04-03 | DRG: 378 | Disposition: A | Discharge status: Home / Self Care | Attending: Internal Medicine | Admitting: Internal Medicine

## 2024-03-31 ENCOUNTER — Emergency Department

## 2024-03-31 DIAGNOSIS — F10930 Alcohol use, unspecified with withdrawal, uncomplicated: Secondary | ICD-10-CM | POA: Diagnosis not present

## 2024-03-31 DIAGNOSIS — F17210 Nicotine dependence, cigarettes, uncomplicated: Secondary | ICD-10-CM | POA: Diagnosis present

## 2024-03-31 DIAGNOSIS — E66811 Obesity, class 1: Secondary | ICD-10-CM | POA: Diagnosis present

## 2024-03-31 DIAGNOSIS — K5720 Diverticulitis of large intestine with perforation and abscess without bleeding: Principal | ICD-10-CM | POA: Diagnosis present

## 2024-03-31 DIAGNOSIS — K5721 Diverticulitis of large intestine with perforation and abscess with bleeding: Secondary | ICD-10-CM | POA: Diagnosis present

## 2024-03-31 DIAGNOSIS — F1020 Alcohol dependence, uncomplicated: Secondary | ICD-10-CM | POA: Diagnosis present

## 2024-03-31 DIAGNOSIS — K769 Liver disease, unspecified: Secondary | ICD-10-CM | POA: Diagnosis present

## 2024-03-31 DIAGNOSIS — Z5941 Food insecurity: Secondary | ICD-10-CM | POA: Diagnosis not present

## 2024-03-31 DIAGNOSIS — I10 Essential (primary) hypertension: Secondary | ICD-10-CM | POA: Diagnosis present

## 2024-03-31 DIAGNOSIS — E876 Hypokalemia: Secondary | ICD-10-CM | POA: Diagnosis present

## 2024-03-31 DIAGNOSIS — R319 Hematuria, unspecified: Secondary | ICD-10-CM | POA: Diagnosis present

## 2024-03-31 DIAGNOSIS — Z6832 Body mass index (BMI) 32.0-32.9, adult: Secondary | ICD-10-CM | POA: Diagnosis not present

## 2024-03-31 DIAGNOSIS — Z5900 Homelessness unspecified: Secondary | ICD-10-CM | POA: Diagnosis not present

## 2024-03-31 DIAGNOSIS — K5732 Diverticulitis of large intestine without perforation or abscess without bleeding: Secondary | ICD-10-CM | POA: Diagnosis not present

## 2024-03-31 HISTORY — DX: Diverticulitis of intestine, part unspecified, without perforation or abscess without bleeding: K57.92

## 2024-03-31 LAB — CBC
HCT: 47.5 % (ref 39.0–52.0)
Hemoglobin: 17 g/dL (ref 13.0–17.0)
MCH: 36 pg — ABNORMAL HIGH (ref 26.0–34.0)
MCHC: 35.8 g/dL (ref 30.0–36.0)
MCV: 100.6 fL — ABNORMAL HIGH (ref 80.0–100.0)
Platelets: 268 K/uL (ref 150–400)
RBC: 4.72 MIL/uL (ref 4.22–5.81)
RDW: 12.2 % (ref 11.5–15.5)
WBC: 11.1 K/uL — ABNORMAL HIGH (ref 4.0–10.5)
nRBC: 0 % (ref 0.0–0.2)

## 2024-03-31 LAB — LIPASE, BLOOD: Lipase: 56 U/L — ABNORMAL HIGH (ref 11–51)

## 2024-03-31 LAB — COMPREHENSIVE METABOLIC PANEL WITH GFR
ALT: 64 U/L — ABNORMAL HIGH (ref 0–44)
AST: 79 U/L — ABNORMAL HIGH (ref 15–41)
Albumin: 4.2 g/dL (ref 3.5–5.0)
Alkaline Phosphatase: 91 U/L (ref 38–126)
Anion gap: 17 — ABNORMAL HIGH (ref 5–15)
BUN: 7 mg/dL (ref 6–20)
CO2: 20 mmol/L — ABNORMAL LOW (ref 22–32)
Calcium: 9.4 mg/dL (ref 8.9–10.3)
Chloride: 101 mmol/L (ref 98–111)
Creatinine, Ser: 0.86 mg/dL (ref 0.61–1.24)
GFR, Estimated: >60 mL/min (ref >60)
Glucose, Bld: 97 mg/dL (ref 70–99)
Potassium: 3.4 mmol/L — ABNORMAL LOW (ref 3.5–5.1)
Sodium: 138 mmol/L (ref 135–145)
Total Bilirubin: 1.2 mg/dL (ref 0.0–1.2)
Total Protein: 8.8 g/dL — ABNORMAL HIGH (ref 6.5–8.1)

## 2024-03-31 MED ORDER — THIAMINE MONONITRATE 100 MG PO TABS
100.0000 mg | ORAL_TABLET | Freq: Every day | ORAL | Status: DC
  Administered 2024-03-31 – 2024-04-03 (×4): 100 mg via ORAL
  Filled 2024-03-31 (×4): qty 1

## 2024-03-31 MED ORDER — ONDANSETRON HCL 4 MG PO TABS: 4.0000 mg | ORAL_TABLET | Freq: Four times a day (QID) | ORAL | Status: DC | PRN

## 2024-03-31 MED ORDER — ADULT MULTIVITAMIN W/MINERALS CH
1.0000 | ORAL_TABLET | Freq: Every day | ORAL | Status: DC
  Administered 2024-03-31 – 2024-04-03 (×4): 1 via ORAL
  Filled 2024-03-31 (×4): qty 1

## 2024-03-31 MED ORDER — ONDANSETRON HCL 4 MG/2ML IJ SOLN: 4.0000 mg | Freq: Four times a day (QID) | INTRAMUSCULAR | Status: DC | PRN

## 2024-03-31 MED ORDER — BISACODYL 5 MG PO TBEC: 5.0000 mg | DELAYED_RELEASE_TABLET | Freq: Every day | ORAL | Status: DC | PRN

## 2024-03-31 MED ORDER — LORAZEPAM 2 MG/ML IJ SOLN: 1.0000 mg | INTRAMUSCULAR | Status: DC | PRN

## 2024-03-31 MED ORDER — HEPARIN SODIUM (PORCINE) 5000 UNIT/ML IJ SOLN
5000.0000 [IU] | Freq: Three times a day (TID) | INTRAMUSCULAR | Status: DC
  Administered 2024-03-31 – 2024-04-03 (×8): 5000 [IU] via SUBCUTANEOUS
  Filled 2024-03-31 (×7): qty 1

## 2024-03-31 MED ORDER — PIPERACILLIN-TAZOBACTAM 3.375 G IVPB
3.3750 g | Freq: Once | INTRAVENOUS | Status: DC
  Administered 2024-03-31: 3.375 g via INTRAVENOUS
  Filled 2024-03-31: qty 50

## 2024-03-31 MED ORDER — THIAMINE HCL 100 MG/ML IJ SOLN
100.0000 mg | Freq: Every day | INTRAMUSCULAR | Status: DC
  Filled 2024-03-31 (×2): qty 2

## 2024-03-31 MED ORDER — LORAZEPAM 1 MG PO TABS: 1.0000 mg | ORAL_TABLET | ORAL | Status: DC | PRN

## 2024-03-31 MED ORDER — ACETAMINOPHEN 650 MG RE SUPP: 650.0000 mg | Freq: Four times a day (QID) | RECTAL | Status: DC | PRN

## 2024-03-31 MED ORDER — MORPHINE SULFATE (PF) 4 MG/ML IV SOLN
4.0000 mg | Freq: Once | INTRAVENOUS | Status: AC
  Administered 2024-03-31: 4 mg via INTRAVENOUS
  Filled 2024-03-31: qty 1

## 2024-03-31 MED ORDER — IOHEXOL 300 MG/ML  SOLN
100.0000 mL | Freq: Once | INTRAMUSCULAR | Status: AC | PRN
  Administered 2024-03-31: 100 mL via INTRAVENOUS

## 2024-03-31 MED ORDER — OXYCODONE HCL 5 MG PO TABS
5.0000 mg | ORAL_TABLET | ORAL | Status: DC | PRN
  Administered 2024-03-31 – 2024-04-03 (×4): 5 mg via ORAL
  Filled 2024-03-31 (×4): qty 1

## 2024-03-31 MED ORDER — ONDANSETRON HCL 4 MG/2ML IJ SOLN
4.0000 mg | Freq: Once | INTRAMUSCULAR | Status: AC
  Administered 2024-03-31: 4 mg via INTRAVENOUS
  Filled 2024-03-31: qty 2

## 2024-03-31 MED ORDER — SODIUM CHLORIDE 0.9 % IV SOLN
1.0000 g | Freq: Three times a day (TID) | INTRAVENOUS | Status: DC
  Administered 2024-03-31 – 2024-04-03 (×8): 1 g via INTRAVENOUS
  Filled 2024-03-31 (×9): qty 20

## 2024-03-31 MED ORDER — ACETAMINOPHEN 325 MG PO TABS
650.0000 mg | ORAL_TABLET | Freq: Four times a day (QID) | ORAL | Status: DC | PRN
  Filled 2024-03-31 (×2): qty 2

## 2024-03-31 MED ORDER — FOLIC ACID 1 MG PO TABS
1.0000 mg | ORAL_TABLET | Freq: Every day | ORAL | Status: DC
  Administered 2024-03-31 – 2024-04-03 (×4): 1 mg via ORAL
  Filled 2024-03-31 (×4): qty 1

## 2024-03-31 MED ORDER — POTASSIUM CHLORIDE CRYS ER 20 MEQ PO TBCR
40.0000 meq | EXTENDED_RELEASE_TABLET | Freq: Two times a day (BID) | ORAL | Status: AC
  Administered 2024-03-31 – 2024-04-01 (×2): 40 meq via ORAL
  Filled 2024-03-31 (×2): qty 2

## 2024-03-31 MED ORDER — ALBUTEROL SULFATE (2.5 MG/3ML) 0.083% IN NEBU: 2.5000 mg | INHALATION_SOLUTION | RESPIRATORY_TRACT | Status: DC | PRN

## 2024-03-31 MED ORDER — MORPHINE SULFATE (PF) 2 MG/ML IV SOLN
2.0000 mg | INTRAVENOUS | Status: DC | PRN
  Administered 2024-03-31: 2 mg via INTRAVENOUS
  Filled 2024-03-31: qty 1

## 2024-03-31 NOTE — Progress Notes (Incomplete)
 5 4 lokos before noon.  1/5 to 2/5 every day.  CIWA-

## 2024-03-31 NOTE — H&P (Signed)
 History and Physical  Christian Dickerson FMW:[id number] DOB: 1986-05-17 DOA: 03/31/2024 PCP: Patient, No Pcp Per  Chief Complaint: abdominal pain Historian: patient  HPI:  Christian Dickerson is a 38 y.o. male with a PMH significant for alcohol dependence, diverticulitis with colon perforation. At baseline, they have housing insecurities and are independent with ADLs.  They presented from home to the ED on 03/31/2024 with abdominal pain x several days intermittently but got acutely worse this am.  Patient states that he has had 8 watery black stools throughout the day today with lower abdominal pelvic pain which has been unbearable.  He states it has been about 3 days since he has had a solid meal due to no appetite.  He also endorses large alcohol consumption.  He drank 5 large Four Lokos before noon today which was his last drink.  On a typical day he drinks 1 fifth to 2 fifths of vodka every day.  Denies history of seizures with withdrawals but on his recent hospitalization for the same problem, he had jitteriness and tremors. He presented to the hospital for similar symptoms and was admitted on 9/7 by general surgery when he was found to have diverticulitis with abscess and colon perforation.  He was originally put on IV antibiotics without resolution of his symptoms so underwent percutaneous drain placement on 9/11 by IR.  Drain was removed on 9/16.  He was discharged on 9/16 with instructions to complete additional 7 days of Cipro  plus Flagyl .  Patient tells me that after discharge he stayed with his brother in [place] and his niece was playing with his antibiotics and spilled them on the floor so he was unable to complete about 8 days of his antibiotics.  Though he did feel well and was able to tolerate normal diet after discharge he has progressively gotten worse since discontinuing his antibiotics. Additionally, patient tells me that he has bright red blood on toilet paper when wiping as well as today  his urine was dark red.  Denies dysuria.  He has been unable to provide urine sample at this time.  In the ED, it was found that they had stable vital signs.  Significant findings included: Na+ 138, K+ 3.4, creatinine 0.86, AST 79, ALT 64.  WBC 11.1, MCV 100.6, platelets 268 CT abdomen/pelvis: Persistent sigmoid colon diverticulitis. There is a small mesenteric multiloculated fluid collection posterior to the sigmoid colon which has slightly increased in size when compared to prior.  They were initially treated with morphine , Zofran , Zosyn .   Patient was admitted to medicine service for further workup and management of diverticulitis with abscess as outlined in detail below.  Assessment/Plan Principal Problem:   Colonic diverticular abscess   Colonic diverticular abscess  diarrhea- CT abdomen/pelvis: Persistent sigmoid colon diverticulitis. There is a small mesenteric multiloculated fluid collection posterior to the sigmoid colon which has slightly increased in size when compared to prior.recent culture data from percutaneous drainage indicates ESBL infection.  Patient did not complete his outpatient antibiotic course as directed and appears to have recurrence of his abdominal infection.  Was treated with Zosyn  in ED which was resistant on most recent culture. - Starting meropenem  - General Surgery, Dr. Marinda consulted for evaluation and considering percutaneous drainage again - FOBT  Compensated liver disease- elevated transaminases  Severe EtOH dependence-last drink was at about noon on day of admission. Routinely drinks 1-2 fifths of vodka daily. Denies history of seizures with withdrawals. - CIWA monitoring with sliding scale Ativan  for withdrawals. -  Thiamine , multivitamin, folic acid  - TOC consult for resources  Mild hypokalemia - Replace and monitor as needed  Subjective hematuria-denies dysuria.  Potentially severe dehydration vs has h/o kidney stones.  No renal calculi or seen  on abdominal CT.  And bladder was unremarkable. Awaiting urine sample for further evaluation - Follow-up urinalysis  Tobacco use-nicotine  patch as needed  Malnutrition-insetting of large volume alcohol consumption, has not been consuming nutritious foods and will have malabsorption issues. MCV mildly elevated - Supplements as above - RD consult  Past Medical History:  Diagnosis Date   Diverticulitis    Past Surgical History:  Procedure Laterality Date   ANKLE SURGERY     CHEST TUBE INSERTION     LITHOTRIPSY     TIBIA FRACTURE SURGERY     TONSILLECTOMY AND ADENOIDECTOMY      reports that he has been smoking cigarettes. He has never used smokeless tobacco. He reports current alcohol use of about 35.0 standard drinks of alcohol per week. He reports that he does not use drugs.  No Known Allergies  History reviewed. No pertinent family history.  Prior to Admission medications   Not on File   I have personally, briefly reviewed patient's prior medical records in [facility]  Objective: Blood pressure (!) 142/91, pulse (!) 103, temperature 98.2 F (36.8 C), resp. rate 18, height 6' 1 (1.854 m), weight 111.1 kg, SpO2 97%.   Constitutional: NAD, calm, comfortable Neck: normal, supple, no masses, no thyromegaly Respiratory: CTAB, no wheezing, no crackles. Normal respiratory effort. No accessory muscle use.  Cardiovascular: RRR, no murmurs / rubs / gallops. No extremity edema. 2+ pedal pulses. no clubbing / cyanosis.  Abdomen: soft, NT to palpation. Indicated pain in lower pelvic areas under panus. Lower left quadrant surgical incision from recent percutaneous drain is healing well without signs of infection Musculoskeletal: No joint deformity upper and lower extremities. Normal muscle tone.  Skin: dry, intact, normal color, normal temperature on exposed skin Neurologic: Alert and oriented x 3. Normal speech. Grossly non-focal exam. PERRL Psychiatric: Normal mood. Congruent  affect.  Labs on Admission: I have personally reviewed admission labs and imaging studies  CBC    Component Value Date/Time   WBC 11.1 (H) 03/31/2024 1806   RBC 4.72 03/31/2024 1806   HGB 17.0 03/31/2024 1806   HGB 15.2 07/09/2014 0550   HCT 47.5 03/31/2024 1806   HCT 45.9 07/09/2014 0550   PLT 268 03/31/2024 1806   PLT 185 07/09/2014 0550   MCV 100.6 (H) 03/31/2024 1806   MCV 100 07/09/2014 0550   MCH 36.0 (H) 03/31/2024 1806   MCHC 35.8 03/31/2024 1806   RDW 12.2 03/31/2024 1806   RDW 13.4 07/09/2014 0550   LYMPHSABS 2.6 05/16/2015 2120   LYMPHSABS 2.6 06/09/2014 2103   MONOABS 0.8 05/16/2015 2120   MONOABS 0.7 06/09/2014 2103   EOSABS 0.4 05/16/2015 2120   EOSABS 0.4 06/09/2014 2103   BASOSABS 0.2 (H) 05/16/2015 2120   BASOSABS 0.1 06/09/2014 2103   CMP     Component Value Date/Time   NA 138 03/31/2024 1806   NA 142 07/09/2014 0550   K 3.4 (L) 03/31/2024 1806   K 4.1 07/09/2014 0550   CL 101 03/31/2024 1806   CL 108 (H) 07/09/2014 0550   CO2 20 (L) 03/31/2024 1806   CO2 25 07/09/2014 0550   GLUCOSE 97 03/31/2024 1806   GLUCOSE 109 (H) 07/09/2014 0550   BUN 7 03/31/2024 1806   BUN 15 07/09/2014 0550  CREATININE 0.86 03/31/2024 1806   CREATININE 1.16 07/09/2014 0550   CALCIUM 9.4 03/31/2024 1806   CALCIUM 8.8 07/09/2014 0550   PROT 8.8 (H) 03/31/2024 1806   PROT 7.1 07/09/2014 0550   ALBUMIN 4.2 03/31/2024 1806   ALBUMIN 3.6 07/09/2014 0550   AST 79 (H) 03/31/2024 1806   AST 23 07/09/2014 0550   ALT 64 (H) 03/31/2024 1806   ALT 21 07/09/2014 0550   ALKPHOS 91 03/31/2024 1806   ALKPHOS 78 07/09/2014 0550   BILITOT 1.2 03/31/2024 1806   BILITOT 0.2 07/09/2014 0550   GFRNONAA >60 03/31/2024 1806   GFRNONAA >60 07/09/2014 0550   GFRNONAA >60 11/22/2013 1426   GFRAA >60 04/29/2019 0948   GFRAA >60 07/09/2014 0550   GFRAA >60 11/22/2013 1426    Radiological Exams on Admission: CT ABDOMEN PELVIS W CONTRAST Result Date: 03/31/2024 CLINICAL DATA:   Diverticulitis complication. EXAM: CT ABDOMEN AND PELVIS WITH CONTRAST TECHNIQUE: Multidetector CT imaging of the abdomen and pelvis was performed using the standard protocol following bolus administration of intravenous contrast. RADIATION DOSE REDUCTION: This exam was performed according to the departmental dose-optimization program which includes automated exposure control, adjustment of the mA and/or kV according to patient size and/or use of iterative reconstruction technique. CONTRAST:  OMNIPAQUE  IOHEXOL  300 MG/ML  SOLN COMPARISON:  CT abdomen and pelvis 03/21/2024. FINDINGS: Lower chest: No acute abnormality. Hepatobiliary: No focal liver abnormality is seen. No gallstones, gallbladder wall thickening, or biliary dilatation. Pancreas: Unremarkable. No pancreatic ductal dilatation or surrounding inflammatory changes. Spleen: Normal in size without focal abnormality. Adrenals/Urinary Tract: Adrenal glands are unremarkable. Kidneys are normal, without renal calculi, focal lesion, or hydronephrosis. Bladder is unremarkable. Stomach/Bowel: Again seen is sigmoid colon diverticulosis. There is wall thickening of the proximal sigmoid colon with minimal surrounding inflammation similar to prior. Posterior to the sigmoid colon there is a small mesenteric small multiloculated fluid collection with the largest component measuring 1.8 x 1.9 by 1.6 cm. This has slightly increased in size when compared to prior. The percutaneous drainage catheter has been removed in the interval. There is no bowel obstruction. The appendix is within normal limits. Small bowel and stomach are within normal limits. Vascular/Lymphatic: No significant vascular findings are present. No enlarged abdominal or pelvic lymph nodes. Reproductive: Prostate is unremarkable. Other: No ascites or free air. Musculoskeletal: Degenerative changes affect the spine. IMPRESSION: Persistent sigmoid colon diverticulitis. There is a small mesenteric  multiloculated fluid collection posterior to the sigmoid colon which has slightly increased in size when compared to prior. Percutaneous drainage catheter has been removed in the interval. Electronically Signed   By: Greig Pique M.D.   On: 03/31/2024 19:04   EKG: None done  DVT prophylaxis: heparin  injection 5,000 Units Start: 03/31/24 2200 SCDs Start: 03/31/24 1932   Code Status: full  Family Communication: none at bedside   Disposition Plan: admit to med-surg for surgical eval  Consults called: general surgery, Dr. Marinda Marien LITTIE Lenon, DO [facility]  03/31/2024, 7:33 PM    To contact the appropriate [facility] Attending or Consulting provider: Check [email or web address] for coverage from 7pm-7am

## 2024-03-31 NOTE — ED Triage Notes (Signed)
 First Nurse Note: Patient to ED via [facility] from home for abd pain. Pt left AMA on Monday after getting admitted for diverticulitis. PT was given meds for same and lost medication after 2 days. PT reports unable to eat in the past 4 days and dry heaving. VS WNL

## 2024-03-31 NOTE — ED Triage Notes (Signed)
 Pt to ED [facility] from home for hematuria, dark stools, abd pain started Saturday., daily ETOH

## 2024-03-31 NOTE — ED Notes (Signed)
 See triage note  Presents with mid /lower abd pain   States he noticed some blood in urine  and dark stools  Was dx'd with diverticulitis and left the hospital on Monday

## 2024-03-31 NOTE — Telephone Encounter (Signed)
 Patient calls, he was seen in ED for diverticulitis colon.  Patient states that he has not eaten in the past 4 days.  Patient states that he is having blood in his urine and states his stool is black.  Patient with some pain, no fever, some nausea.  Under the advisement of Dr. Lane patient is advised he needs to go to the ER for further evaluation.  Patient verbalized understanding.

## 2024-03-31 NOTE — ED Notes (Signed)
 Patient noted to be sitting outside. Security informed patient to come inside for triage- pt stated he needed a moment before coming inside.

## 2024-03-31 NOTE — ED Provider Notes (Signed)
 [facility] Provider Note    Event Date/Time   First MD Initiated Contact with Patient 03/31/24 1814     (approximate)   History   Abdominal Pain   HPI  Christian Dickerson is a 38 y.o. male who presents with complaints of increasing abdominal pain.  Patient was discharged on the 21st after treatment of diverticular abscess with interventional radiology drain.  Increased pain over the last 2 days, referred to the emergency department by surgeons office.     Physical Exam   Triage Vital Signs: ED Triage Vitals [03/31/24 1803]  Encounter Vitals Group     BP (!) 142/91     Girls Systolic BP Percentile      Girls Diastolic BP Percentile      Boys Systolic BP Percentile      Boys Diastolic BP Percentile      Pulse Rate (!) 103     Resp 18     Temp 98.2 F (36.8 C)     Temp src      SpO2 97 %     Weight 111.1 kg (245 lb)     Height 1.854 m (6' 1)     Head Circumference      Peak Flow      Pain Score 7     Pain Loc      Pain Education      Exclude from Growth Chart     Most recent vital signs: Vitals:   03/31/24 1803  BP: (!) 142/91  Pulse: (!) 103  Resp: 18  Temp: 98.2 F (36.8 C)  SpO2: 97%     General: Awake, no distress.  CV:  Good peripheral perfusion.  Resp:  Normal effort.  Abd:  No distention.  Tenderness along the left abdomen primarily in the left lower quadrant, mild skin irritation from drain tape Other:     ED Results / Procedures / Treatments   Labs (all labs ordered are listed, but only abnormal results are displayed) Labs Reviewed  COMPREHENSIVE METABOLIC PANEL WITH GFR - Abnormal; Notable for the following components:      Result Value   Potassium 3.4 (*)    CO2 20 (*)    Total Protein 8.8 (*)    AST 79 (*)    ALT 64 (*)    Anion gap 17 (*)    All other components within normal limits  CBC - Abnormal; Notable for the following components:   WBC 11.1 (*)    MCV 100.6 (*)    MCH 36.0 (*)    All other  components within normal limits  URINALYSIS, ROUTINE W REFLEX MICROSCOPIC  LIPASE, BLOOD     EKG     RADIOLOGY CT scan shows multi loculated slightly enlarged fluid collection    PROCEDURES:  Critical Care performed:   Procedures   MEDICATIONS ORDERED IN ED: Medications  morphine  (PF) 4 MG/ML injection 4 mg (4 mg Intravenous Given 03/31/24 1833)  ondansetron  (ZOFRAN ) injection 4 mg (4 mg Intravenous Given 03/31/24 1832)  iohexol  (OMNIPAQUE ) 300 MG/ML solution 100 mL (100 mLs Intravenous Contrast Given 03/31/24 1850)     IMPRESSION / MDM / ASSESSMENT AND PLAN / ED COURSE  I reviewed the triage vital signs and the nursing notes. Patient's presentation is most consistent with acute presentation with potential threat to life or bodily function.  Patient with history of diverticular abscess presents with increasing pain, he is tachycardic on arrival.  Lab work is notable  for mildly elevated white blood cell count.  Will treat with morphine , IV Zofran , send for CT abdomen pelvis  CT scan shows slightly enlarged fluid collection, multiloculated, discussed with Dr. Marinda of surgery who recommends IV antibiotics, admission to medicine, surgery will consult.        FINAL CLINICAL IMPRESSION(S) / ED DIAGNOSES   Final diagnoses:  Colonic diverticular abscess     Rx / DC Orders   ED Discharge Orders     None        Note:  This document was prepared using Dragon voice recognition software and may include unintentional dictation errors.   Arlander [place], MD 03/31/24 [phone]

## 2024-04-01 DIAGNOSIS — K5732 Diverticulitis of large intestine without perforation or abscess without bleeding: Secondary | ICD-10-CM | POA: Diagnosis not present

## 2024-04-01 DIAGNOSIS — K5720 Diverticulitis of large intestine with perforation and abscess without bleeding: Secondary | ICD-10-CM | POA: Diagnosis not present

## 2024-04-01 LAB — COMPREHENSIVE METABOLIC PANEL WITH GFR
ALT: 52 U/L — ABNORMAL HIGH (ref 0–44)
AST: 59 U/L — ABNORMAL HIGH (ref 15–41)
Albumin: 3.6 g/dL (ref 3.5–5.0)
Alkaline Phosphatase: 72 U/L (ref 38–126)
Anion gap: 14 (ref 5–15)
BUN: 10 mg/dL (ref 6–20)
CO2: 23 mmol/L (ref 22–32)
Calcium: 9.2 mg/dL (ref 8.9–10.3)
Chloride: 102 mmol/L (ref 98–111)
Creatinine, Ser: 0.88 mg/dL (ref 0.61–1.24)
GFR, Estimated: >60 mL/min (ref >60)
Glucose, Bld: 107 mg/dL — ABNORMAL HIGH (ref 70–99)
Potassium: 3.7 mmol/L (ref 3.5–5.1)
Sodium: 139 mmol/L (ref 135–145)
Total Bilirubin: 1.5 mg/dL — ABNORMAL HIGH (ref 0.0–1.2)
Total Protein: 7.4 g/dL (ref 6.5–8.1)

## 2024-04-01 LAB — CBC WITH DIFFERENTIAL/PLATELET
Abs Immature Granulocytes: 0.02 K/uL (ref 0.00–0.07)
Basophils Absolute: 0.1 K/uL (ref 0.0–0.1)
Basophils Relative: 1 %
Eosinophils Absolute: 0.4 K/uL (ref 0.0–0.5)
Eosinophils Relative: 6 %
HCT: 43.9 % (ref 39.0–52.0)
Hemoglobin: 14.9 g/dL (ref 13.0–17.0)
Immature Granulocytes: 0 %
Lymphocytes Relative: 39 %
Lymphs Abs: 2.8 K/uL (ref 0.7–4.0)
MCH: 34.9 pg — ABNORMAL HIGH (ref 26.0–34.0)
MCHC: 33.9 g/dL (ref 30.0–36.0)
MCV: 102.8 fL — ABNORMAL HIGH (ref 80.0–100.0)
Monocytes Absolute: 0.6 K/uL (ref 0.1–1.0)
Monocytes Relative: 9 %
Neutro Abs: 3.1 K/uL (ref 1.7–7.7)
Neutrophils Relative %: 45 %
Platelets: 197 K/uL (ref 150–400)
RBC: 4.27 MIL/uL (ref 4.22–5.81)
RDW: 12.5 % (ref 11.5–15.5)
WBC: 7.1 K/uL (ref 4.0–10.5)
nRBC: 0 % (ref 0.0–0.2)

## 2024-04-01 LAB — URINALYSIS, ROUTINE W REFLEX MICROSCOPIC
Bilirubin Urine: NEGATIVE
Glucose, UA: NEGATIVE mg/dL
Hgb urine dipstick: NEGATIVE
Ketones, ur: NEGATIVE mg/dL
Leukocytes,Ua: NEGATIVE
Nitrite: NEGATIVE
Protein, ur: NEGATIVE mg/dL
Specific Gravity, Urine: >1.046 — ABNORMAL HIGH (ref 1.005–1.030)
pH: 5 (ref 5.0–8.0)

## 2024-04-01 LAB — OCCULT BLOOD X 1 CARD TO LAB, STOOL: Fecal Occult Bld: NEGATIVE

## 2024-04-01 LAB — MAGNESIUM: Magnesium: 2 mg/dL (ref 1.7–2.4)

## 2024-04-01 LAB — PHOSPHORUS: Phosphorus: 4.2 mg/dL (ref 2.5–4.6)

## 2024-04-01 MED ORDER — ENSURE PLUS HIGH PROTEIN PO LIQD
237.0000 mL | Freq: Two times a day (BID) | ORAL | Status: DC
  Administered 2024-04-01 – 2024-04-02 (×3): 237 mL via ORAL

## 2024-04-01 MED ORDER — HYDROMORPHONE HCL 1 MG/ML IJ SOLN
1.0000 mg | INTRAMUSCULAR | Status: DC | PRN
Start: 2024-04-01 — End: 2024-04-03
  Administered 2024-04-01 – 2024-04-03 (×11): 1 mg via INTRAVENOUS
  Filled 2024-04-01 (×12): qty 1

## 2024-04-01 MED ORDER — CHLORDIAZEPOXIDE HCL 25 MG PO CAPS
25.0000 mg | ORAL_CAPSULE | Freq: Four times a day (QID) | ORAL | Status: DC
  Administered 2024-04-01 – 2024-04-02 (×7): 25 mg via ORAL
  Filled 2024-04-01 (×7): qty 1

## 2024-04-01 NOTE — Discharge Instructions (Signed)
 Housing Resources                    [employer] (serves [place], [place], [place], [place], [place], [place], [place], [place], [place], [place], [place], [place], and [place] counties) [address], [place], [place] [address] [phone] DeveloperU.ch  **Rental assistance, Home Rehabilitation,Weatherization Assistance Program, Chief Financial Officer, Housing Voucher Program  [employer] for Housing and [employer] Studies: Proofreader Resources to residents of [place], [place], and [place] Make sure you have your documents ready, including:  (Household income verification: 2 months pay stubs, unemployment/social security award letter, statement of no income for all household members over 5) Photo ID for all household members over 18 Utility Bill/Rent Ledger/Lease: must show past due amount for utilities/rent, or the rental agreement if rent is current 2. Start your application online or by paper (in [place] or [place]) at:     [email or web address]  3. Once you have completed the online application, you will get an email confirmation message from the county. Expect to hear back by phone or email at least 6-10 weeks from submitting your application.  4. While you wait:  Call [phone] to check in on your application Let your landlord know that you've applied. Your landlord will be asked to submit documents (W-9) during this application process. Payments will be made directly to the landlord/property management company and utility company Rent or utility assistance for [employer], [place], and [place] Apply at [email or web address] Questions? Call or email Renee at [phone] or [email or web address]   Eviction Mediation Program: The HOPE Program Https://www.rebuild.[email or web address].cy HOPE Progam serves low-income renters in 41  [place]  counties, defined as less than or equal to 80% of the area median income for the county where the renter lives. In the following 12 counties, you should apply to your local rent and utility assistance program INSTEAD OF the HOPE Program: [place], [place], [place], [place], [place], [place], [place], [place], [place], [place], [place], [place]  If you live outside of [place], contact HOPE call center at [phone] to talk to a Program Representative Monday-Friday, 8am-5pm Note that Native American tribes also received federal funding for rent and utility assistance programs. Recognized members of the following tribes will be served by programs managed by tribal governments, including: [place] Indians, [place], [place], [place] of [place]  and [facility] Eviction Mediation Coordinator, [place], [phone] [email or web address] .[employer], [place], [phone] [email or web address]   Housing Resources [facility] Authority- [facility] [address], [place], [place] [address] [phone] www.gha-[place].[facility] [address] [place], [place] [address] [phone] PhoneCaptions.ch **Programs include: Hospital doctor and Housing Counseling, Healthy Radiographer, therapeutic, Homeless Prevention and Housing Assistance  Government [facility] [address], [place], [place] [address] [phone] www.PaintingEmporium.co.za **housing applications/recertification; tax payment relief/exemption under specific qualifications  [facility] [address] [place], [place] [address] www.onlinegreensboro.com/~maryshouse **transitional housing for women in recovery who have minor children or are pregnant  [facility] [address] [place], [place], [place] [address] https://johnson-smith.net/  **emergency shelter and  support services for families facing homelessness  [facility] [address], [place], [place] [address] [phone] www.youthfocus.org **transitional housing to pregnant women; emergency housing for youth who have run away, are experiencing a family crisis, are victims of abuse or neglect, or are homeless  [facility] [address], [place], [place] [address] [phone]  ircgso.org **Drop-in center for people experiencing homelessness; overnight warming center when temperature is 25 degrees or below  [facility]ing [address] [place], [place], [place] [address] [phone] https://reentrystaffingagency.org/ **help with affordable housing to people experiencing homelessness or unemployment due to incarceration  [facility] [address], [place], [place] [address] [phone] www.greensborourbanministry.org  **emergency and transitional housing, rent/mortgage assistance, utility assistance  [facility] [address] [place], [place] [address] [phone] www.salvationarmyofgreensboro.org **emergency and transitional housing  Habitat for [employer] [address], [place], [place] [address] [phone] Www.habitatgreensboro.[facility]   [employer] [address], [place], [place] [address] [phone] https://chshousing.org **Home Ownership/Affordable Housing Program and [facility]  [facility] [address], [place], [place] [address] [phone] PaidValue.com.cy **home buyer education courses, foreclosure prevention  [facility], [facility] [address], [place], [place] [address] [phone] DefMagazine.is **Environmental Exposure Assessment (investigation of homes where either children or pregnant women with a confirmed elevated blood lead level  reside)  [facility] of [facility]ational [facility] [address] [place], [place] [address] [phone] ShowReturn.ca **Home Expense Assistance/Repairs Program; offers home accessibility updates, such as ramps or bars in the bathroom  [facility] [address] [place], [place] [address] [phone] https://www.self-help.org/locations/[place] **Offers credit-building and banking services to people unable to use traditional banking   Housing Resources [facility]  Housing Authority- [place] of [facility] [address] [place], [place] [address] [phone] ChatRepair.pl   [facility] [address], [place], [place] [address] [phone]  WrestlingMonthly.pl **housing applications/recertification, emergency and transitional housing  Open [employer] of [employer] [address] [place], [place] [address] [phone] www.odm-hp.org  **emergency and permanent housing; rent/mortgage payment assistance  Habitat for [employer], [place] and [place] [address] [place], [place] [address] [phone] https://russell-walls.com/  Family Services of the [place], [employer] [address] [place], [place], [place] [address] www.familyservice-piedmont.org **emergency shelter for victims of domestic violence and sexual assault  [facility] [address] [place], [place] [address] [phone] www.senior-resources-guilford.org **Home expense assistance/repairs for older adults  Housing Resources [facility] [address] [place], [place] [address] [phone] http://cohen-reilly.biz/  **May offer help with minor housing repairs  [facility] [address] [place], [place] [address] [phone] **emergency housing for victims of domestic  violence  Housing Resources [place], [place], [facility])  [facility] [address] [place], [place] [address] [phone] www.newrha.[place] [address] [place], [place] [address] [phone]  [facility] [address], [place], [place] [address] [phone] www.co.[place].us  **Housing applications/recertification; tax payment relief/exemption w specific qualifications  [facility] Help for Homeless [address] [place], [place] [address] [phone] Http://www.rchelpforhomeless.org/HOME_PAGE.html  HELP, Incorporated [facility] [address] [place], [place] [address] [phone] 24 Hour Crisis Line [phone] Hours of Operation: Monday-Friday, 8:30am-5:00pm http://helpincorporated.org **Includes emergency housing for victims of domestic violence    Rent/Utility/Housing  Agency Name: [facility] Agency Address: [address] [place], [place] [address] Phone: [phone] Email: [email or web address] Website: www.alamanceservices.org Service(s) Offered: Housing services, self-sufficiency, congregate meal program, weatherization program, Field seismologist program, emergency food assistance,  housing counseling, home ownership program, wheels -towork program.  Agency Name: [facility] Address: [address], [place], [place] [address] Phone: [phone] (8a-4p) [phone] (  8p- 10p) Email: [email or web address] Website: www.piedmontrescuemission.org Service(s) Offered: A program for homeless and/or needy men that includes one-on-one counseling, life skills training and job rehabilitation.  Agency Name: [employer] [facility] [place] Address: [address] [place], [place] [address] Phone: [phone] Website: www.alliedchurches.org Service(s) Offered:  Assistance to needy in emergency with utility bills, heating fuel, and prescriptions. Shelter for homeless 7pm-7am. October 30, 2016 15  Agency Name: [facility] [place] (Developmentally Disabled) Address: [address], [place], [place] [address] Phone: [phone] Contact Person: Lemond Cart Email: [email or web address] Website: LinkWedding.ca Service(s) Offered: Helps individuals with developmental disabilities move from housing that is more restrictive to homes where they  can achieve greater independence and have more  opportunities.  Agency Name: [facility] Address: [address], [place], [place] [address] Phone: [phone] Email: [email or web address] Website: www.burlingtonhousingauthority.org Service(s) Offered: Provides affordable housing for low-income families, elderly, and disabled individuals. Offer a wide range of  programs and services, from financial planning to afterschool and summer programs.  Agency Name: Department of Social Services Address: [address] [place], [place] [address] Phone: [phone] Service(s) Offered: Child support services; child welfare services; food stamps; Medicaid; work first family assistance; and aid with fuel,  rent, food and medicine.  Agency Name: [facility] of [place], [employer]. Address: [facility] [address] [place], [place]  [address] Phone: [phone] Website: www.familyabuseservices.org Service(s) Offered: 24 hour Crisis Line: [phone]; 24 hour Emergency Shelter; Transitional Housing; Support Groups; Scientist, physiological; [employer]; Hispanic Outreach: [phone];  Visitation Center: [phone].  Agency Name: [facility] [place]. Address: [address] [place], [place] [address] Phone: [phone] Service(s) Offered: CAP Services; Home and [employer]; Individual or Group Supports; Respite Care Non-Institutional Nursing;  Residential  Supports; Respite Care and Personal Care Services; Transportation; Family and Friends Night; Recreational Activities; Three Nutritious Meals/Snacks; Consultation with Registered Dietician; Twenty-four hour Registered Nurse Access; Daily and [employer] and [employer]s; Camp Green Leaves; [place] for the [employer] (During Summer Months) Bingo Night (Every  Wednesday Night); Special Populations Dance Night  (Every Tuesday Night); Professional Hair Care Services.  Agency Name: [facility] Address: [address], [place], [place] [address] Phone: [phone] Contact Person: [place] Website: http://goddiditrecoveryhome.homestead.com/contact.Physicist, medical) Offered: Residential treatment facility for women; food and  clothing, educational & employment development and  transportation to work; Counsellor of financial skills;  parenting and family reunification; emotional and spiritual  support; transitional housing for program graduates.  Agency Name: [facility] Address: [address], [place], [place] [address] Phone: [phone] Email: [email or web address] Website: TaskTown.es Service(s) Offered: Public housing units for elderly, disabled, and low income people; housing choice vouchers for income eligible  applicants; shelter plus care vouchers; and Psychologist, clinical.  Agency Name: [facility] of [employer] Address: [address] [place], [place] [address] Phone: [phone] Email: [email or web address]@[email or web address] .net Website: www.habitatalamance.org Service(s) Offered: Build houses for families in need of decent housing. Each adult in the family must invest 200 hours of labor on  someone else's house, work with volunteers to build their own house, attend classes on budgeting, home maintenance, yard care, and attend homeowner association meetings.  Agency Name: [facility] Address: [address], [place], [place]  [address] Phone: [phone] Website: www.rsli.org Service(s) Offered: Intermediate care facilities for intellectually delayed, Supervised Living in group homes for adults with developmental disabilities, Supervised Living for people who have dual diagnoses (MRMI), Independent Living, Supported Living, respite and a variety of  CAP services, pre-vocational services, day supports, and [employer].  Agency Name: [place] [employer]closure Prevention Fund Phone: [phone] Website: www.NCForeclosurePrevention.gov Service(s) Offered: Zero-interest, deferred loans to homeowners struggling to pay their mortgage. Call for more information.

## 2024-04-01 NOTE — Progress Notes (Addendum)
 Progress Note    Christian Dickerson  FMW:[id number] DOB: 1985/11/24  DOA: 03/31/2024 PCP: Patient, No Pcp Per      Brief Narrative:    Medical records reviewed and are as summarized below:  Christian Dickerson is a 38 y.o. male with medical history significant for recent hospitalization for diverticulitis with abscess requiring percutaneous drainage from 03/13/2024 through 03/22/2024, alcohol use disorder, tobacco use disorder, hypertension, previous falls with fractures, ankle surgery, chest tube insertion, tibia fracture surgery, tonsillectomy and adenoidectomy.  He was prescribed about 8 days worth of oral ciprofloxacin  and Flagyl  after he was discharged from the hospital on 03/22/2024.  However, he said he was staying at his brother's house and his niece flushed all the pills so he could not finish the course of antibiotics.  He presented to the hospital with abdominal pain and black stools.  He said he had not been able to eat much since he was discharged from the hospital.  CT abdomen pelvis showed persistent sigmoid colon diverticulitis.     Assessment/Plan:   Principal Problem:   Colonic diverticular abscess    Body mass index is 32.32 kg/m.  (Class I obesity)   Persistent sigmoid colon diverticulitis: Continue IV meropenem .  Analgesics as needed for pain.  He has been evaluated by general surgeon.  No indication for surgery or drain placement. Leukocytosis has improved.   Black stools: Improved. Drop in hemoglobin from 17-14.9.  No indication for blood transfusion.   Hypertension: Continue amlodipine    Alcohol use disorder: Start Librium .  Use Ativan  as needed per CIWA protocol. Tobacco use disorder: Nicotine  patch as needed.    Diet Order             Diet regular Room service appropriate? Yes; Fluid consistency: Thin  Diet effective now                                  Consultants: General  Surgeon  Procedures: None    Medications:    chlordiazePOXIDE   25 mg Oral QID   folic acid   1 mg Oral Daily   heparin   5,000 Units Subcutaneous Q8H   multivitamin with minerals  1 tablet Oral Daily   thiamine   100 mg Oral Daily   Or   thiamine   100 mg Intravenous Daily   Continuous Infusions:  meropenem  (MERREM ) IV 1 g (04/01/24 0445)     Anti-infectives (From admission, onward)    Start     Dose/Rate Route Frequency Ordered Stop   03/31/24 2015  meropenem  (MERREM ) 1 g in sodium chloride  0.9 % 100 mL IVPB        1 g 200 mL/hr over 30 Minutes Intravenous Every 8 hours 03/31/24 2000     03/31/24 1930  piperacillin -tazobactam (ZOSYN ) IVPB 3.375 g  Status:  Discontinued        3.375 g 12.5 mL/hr over 240 Minutes Intravenous  Once 03/31/24 1917 03/31/24 2001              Family Communication/Anticipated D/C date and plan/Code Status   DVT prophylaxis: heparin  injection 5,000 Units Start: 03/31/24 2200 SCDs Start: 03/31/24 1932     Code Status: Full Code  Family Communication: None Disposition Plan: Plan to discharge home   Status is: Inpatient Remains inpatient appropriate because: Diverticulitis       Subjective:   Interval events noted.  He complains of lower abdominal pain.  No bowel movement today.  He had brown stools last night but prior to admission he had black stools.  No vomiting.  Objective:    Vitals:   03/31/24 1803 03/31/24 2033 04/01/24 0408 04/01/24 0758  BP: (!) 142/91 (!) 136/97 (!) 141/95 123/81  Pulse: (!) 103 94 80 75  Resp: 18 16 20 16   Temp: 98.2 F (36.8 C) 98.4 F (36.9 C) 98.1 F (36.7 C) 97.7 F (36.5 C)  TempSrc:  Oral Oral   SpO2: 97% 97% 98% 99%  Weight: 111.1 kg     Height: 6' 1 (1.854 m)      No data found.   Intake/Output Summary (Last 24 hours) at 04/01/2024 1101 Last data filed at 04/01/2024 0900 Gross per 24 hour  Intake 480 ml  Output --  Net 480 ml   Filed Weights   03/31/24 1803  Weight:  111.1 kg    Exam:  GEN: NAD SKIN: Warm and dry EYES: No pallor or icterus ENT: MMM CV: RRR PULM: CTA B ABD: soft, ND, mild lower abdominal tenderness without rebound tenderness or guarding, +BS CNS: AAO x 3, non focal EXT: No edema or tenderness        Data Reviewed:   I have personally reviewed following labs and imaging studies:  Labs: Labs show the following:   Basic Metabolic Panel: Recent Labs  Lab 03/31/24 1806 04/01/24 0752  NA 138 139  K 3.4* 3.7  CL 101 102  CO2 20* 23  GLUCOSE 97 107*  BUN 7 10  CREATININE 0.86 0.88  CALCIUM 9.4 9.2  MG  --  2.0  PHOS  --  4.2   GFR Estimated Creatinine Clearance: 148.8 mL/min (by C-G formula based on SCr of 0.88 mg/dL). Liver Function Tests: Recent Labs  Lab 03/31/24 1806 04/01/24 0752  AST 79* 59*  ALT 64* 52*  ALKPHOS 91 72  BILITOT 1.2 1.5*  PROT 8.8* 7.4  ALBUMIN 4.2 3.6   Recent Labs  Lab 03/31/24 1806  LIPASE 56*   No results for input(s): AMMONIA in the last 168 hours. Coagulation profile No results for input(s): INR, PROTIME in the last 168 hours.  CBC: Recent Labs  Lab 03/31/24 1806 04/01/24 0752  WBC 11.1* 7.1  NEUTROABS  --  3.1  HGB 17.0 14.9  HCT 47.5 43.9  MCV 100.6* 102.8*  PLT 268 197   Cardiac Enzymes: No results for input(s): CKTOTAL, CKMB, CKMBINDEX, TROPONINI in the last 168 hours. BNP (last 3 results) No results for input(s): PROBNP in the last 8760 hours. CBG: No results for input(s): GLUCAP in the last 168 hours. D-Dimer: No results for input(s): DDIMER in the last 72 hours. Hgb A1c: No results for input(s): HGBA1C in the last 72 hours. Lipid Profile: No results for input(s): CHOL, HDL, LDLCALC, TRIG, CHOLHDL, LDLDIRECT in the last 72 hours. Thyroid function studies: No results for input(s): TSH, T4TOTAL, T3FREE, THYROIDAB in the last 72 hours.  Invalid input(s): FREET3 Anemia work up: No results for input(s):  VITAMINB12, FOLATE, FERRITIN, TIBC, IRON, RETICCTPCT in the last 72 hours. Sepsis Labs: Recent Labs  Lab 03/31/24 1806 04/01/24 0752  WBC 11.1* 7.1    Microbiology No results found for this or any previous visit (from the past 240 hours).  Procedures and diagnostic studies:  CT ABDOMEN PELVIS W CONTRAST Result Date: 03/31/2024 CLINICAL DATA:  Diverticulitis complication. EXAM: CT ABDOMEN AND PELVIS WITH CONTRAST TECHNIQUE: Multidetector CT imaging of the abdomen and pelvis was performed using  the standard protocol following bolus administration of intravenous contrast. RADIATION DOSE REDUCTION: This exam was performed according to the departmental dose-optimization program which includes automated exposure control, adjustment of the mA and/or kV according to patient size and/or use of iterative reconstruction technique. CONTRAST:  OMNIPAQUE  IOHEXOL  300 MG/ML  SOLN COMPARISON:  CT abdomen and pelvis 03/21/2024. FINDINGS: Lower chest: No acute abnormality. Hepatobiliary: No focal liver abnormality is seen. No gallstones, gallbladder wall thickening, or biliary dilatation. Pancreas: Unremarkable. No pancreatic ductal dilatation or surrounding inflammatory changes. Spleen: Normal in size without focal abnormality. Adrenals/Urinary Tract: Adrenal glands are unremarkable. Kidneys are normal, without renal calculi, focal lesion, or hydronephrosis. Bladder is unremarkable. Stomach/Bowel: Again seen is sigmoid colon diverticulosis. There is wall thickening of the proximal sigmoid colon with minimal surrounding inflammation similar to prior. Posterior to the sigmoid colon there is a small mesenteric small multiloculated fluid collection with the largest component measuring 1.8 x 1.9 by 1.6 cm. This has slightly increased in size when compared to prior. The percutaneous drainage catheter has been removed in the interval. There is no bowel obstruction. The appendix is within normal limits. Small  bowel and stomach are within normal limits. Vascular/Lymphatic: No significant vascular findings are present. No enlarged abdominal or pelvic lymph nodes. Reproductive: Prostate is unremarkable. Other: No ascites or free air. Musculoskeletal: Degenerative changes affect the spine. IMPRESSION: Persistent sigmoid colon diverticulitis. There is a small mesenteric multiloculated fluid collection posterior to the sigmoid colon which has slightly increased in size when compared to prior. Percutaneous drainage catheter has been removed in the interval. Electronically Signed   By: Greig Pique M.D.   On: 03/31/2024 19:04               LOS: 1 day   Jeffren Dombek  [facility]   Pager on [email or web address]. If 7PM-7AM, please contact night-coverage at [email or web address]     04/01/2024, 11:01 AM

## 2024-04-01 NOTE — Plan of Care (Signed)

## 2024-04-01 NOTE — Consult Note (Signed)
 [facility] SURGICAL CONSULTATION NOTE (initial) - cpt: 00756   HISTORY OF PRESENT ILLNESS (HPI):  37 y.o. male presented to [facility] ED yesterday for evaluation of abdominal pain. Patient known to our service follow admission from 09/07 through 09/16 for diverticulitis with abscess. He did have drain placed during this lat admission but this was removed at DC due to concerns of homelessness and ability to manage it. He reports he had done well at discharge and was staying with his brother. Unfortunately, his niece flushed the last 8 days of antibiotics down the toilet, so he never finished these. He reports LLQ abdominal pain yesterday prior to admission. He did report bloody stools as well which concerned him. He described these as black. No fever, chills, nausea, emesis. No acute changes since discharge. Work up in the ED revealed a leukocytosis to 11.1K (now 7.1), Hgb normal at 17.0, sCr 0.88, no electrolyte derangements. Occult blood test was negative. He did have CT which showed inflammatory changes of known diverticulitis with small foci of air, similar to prior. He was admitted to medicine service and started on Meropenem .   Surgery is consulted by emergency medicine physician Dr. Lamar Price, MD in this context for evaluation and management of diverticulitis.  PAST MEDICAL HISTORY (PMH):  Past Medical History:  Diagnosis Date   Diverticulitis      PAST SURGICAL HISTORY (PSH):  Past Surgical History:  Procedure Laterality Date   ANKLE SURGERY     CHEST TUBE INSERTION     LITHOTRIPSY     TIBIA FRACTURE SURGERY     TONSILLECTOMY AND ADENOIDECTOMY       MEDICATIONS:  Prior to Admission medications   Not on File     ALLERGIES:  No Known Allergies   SOCIAL HISTORY:  Social History   Socioeconomic History   Marital status: Single    Spouse name: Not on file   Number of children: Not on file   Years of education: Not on file   Highest education level: Not on  file  Occupational History   Not on file  Tobacco Use   Smoking status: Every Day    Current packs/day: 1.00    Types: Cigarettes   Smokeless tobacco: Never  Substance and Sexual Activity   Alcohol use: Yes    Alcohol/week: 35.0 standard drinks of alcohol    Types: 35 Shots of liquor per week    Comment: 1/5 a day   Drug use: No   Sexual activity: Not on file  Other Topics Concern   Not on file  Social History Narrative   Not on file   Social Drivers of Health   Financial Resource Strain: Not on file  Food Insecurity: Food Insecurity Present (03/31/2024)   Hunger Vital Sign    Worried About Running Out of Food in the Last Year: Sometimes true    Ran Out of Food in the Last Year: Never true  Transportation Needs: No Transportation Needs (03/31/2024)   PRAPARE - Administrator, Civil Service (Medical): No    Lack of Transportation (Non-Medical): No  Physical Activity: Not on file  Stress: Not on file  Social Connections: Socially Isolated (03/31/2024)   Social Connection and Isolation Panel    Frequency of Communication with Friends and Family: Once a week    Frequency of Social Gatherings with Friends and Family: Never    Attends Religious Services: Never    Database administrator or Organizations: No  Attends Banker Meetings: Never    Marital Status: Never married  Intimate Partner Violence: Not At Risk (03/31/2024)   Humiliation, Afraid, Rape, and Kick questionnaire    Fear of Current or Ex-Partner: No    Emotionally Abused: No    Physically Abused: No    Sexually Abused: No     FAMILY HISTORY:  History reviewed. No pertinent family history.    REVIEW OF SYSTEMS:  Review of Systems  Constitutional:  Negative for chills and fever.  Respiratory:  Negative for cough and shortness of breath.   Cardiovascular:  Negative for chest pain and palpitations.  Gastrointestinal:  Positive for abdominal pain and blood in stool (Subjective). Negative  for constipation, diarrhea, nausea and vomiting.  All other systems reviewed and are negative.   VITAL SIGNS:  Temp:  [98.1 F (36.7 C)-98.4 F (36.9 C)] 98.1 F (36.7 C) (09/26 0408) Pulse Rate:  [80-103] 80 (09/26 0408) Resp:  [16-20] 20 (09/26 0408) BP: (136-142)/(91-97) 141/95 (09/26 0408) SpO2:  [97 %-98 %] 98 % (09/26 0408) Weight:  [111.1 kg] 111.1 kg (09/25 1803)     Height: 6' 1 (185.4 cm) Weight: 111.1 kg BMI (Calculated): 32.33   INTAKE/OUTPUT:  No intake/output data recorded.  PHYSICAL EXAM:  Physical Exam Vitals and nursing note reviewed. Exam conducted with a chaperone present.  Constitutional:      General: He is not in acute distress.    Appearance: He is well-developed. He is obese. He is not ill-appearing.     Comments: Sitting up in bed; NAD  HENT:     Head: Normocephalic and atraumatic.  Eyes:     General: No scleral icterus.    Extraocular Movements: Extraocular movements intact.  Cardiovascular:     Rate and Rhythm: Normal rate and regular rhythm.     Heart sounds: Normal heart sounds. No murmur heard. Pulmonary:     Effort: Pulmonary effort is normal. No respiratory distress.  Abdominal:     General: Abdomen is flat. There is no distension.     Palpations: Abdomen is soft.     Tenderness: There is abdominal tenderness (Mild) in the left lower quadrant. There is no guarding or rebound.     Comments: Abdomen is soft, he is tender in LLQ but this is mild, improved from my previous exams, non-distended, no rebound/guarding   Genitourinary:    Comments: Deferred Skin:    General: Skin is warm and dry.  Neurological:     General: No focal deficit present.     Mental Status: He is alert and oriented to person, place, and time.      Labs:     Latest Ref Rng & Units 03/31/2024    6:06 PM 03/16/2024    7:34 AM 03/15/2024    3:29 AM  CBC  WBC 4.0 - 10.5 K/uL 11.1  9.4  9.1   Hemoglobin 13.0 - 17.0 g/dL 82.9  84.8  85.2   Hematocrit 39.0 - 52.0 %  47.5  43.9  41.6   Platelets 150 - 400 K/uL 268  171  144       Latest Ref Rng & Units 03/31/2024    6:06 PM 03/16/2024    7:34 AM 03/15/2024    3:29 AM  CMP  Glucose 70 - 99 mg/dL 97  92  899   BUN 6 - 20 mg/dL 7  7  9    Creatinine 0.61 - 1.24 mg/dL 9.13  9.31  9.15   Sodium  135 - 145 mmol/L 138  137  140   Potassium 3.5 - 5.1 mmol/L 3.4  3.7  3.6   Chloride 98 - 111 mmol/L 101  100  106   CO2 22 - 32 mmol/L 20  24  23    Calcium 8.9 - 10.3 mg/dL 9.4  8.8  8.7   Total Protein 6.5 - 8.1 g/dL 8.8   7.1   Total Bilirubin 0.0 - 1.2 mg/dL 1.2   1.6   Alkaline Phos 38 - 126 U/L 91   80   AST 15 - 41 U/L 79   39   ALT 0 - 44 U/L 64   31      Imaging studies:   CT Abdomen/Pelvis (03/31/2024) personally reviewed with similar appearance of diverticulitis, small loculated foci of air, no abscess, and radiologist report reviewed below:  IMPRESSION: Persistent sigmoid colon diverticulitis. There is a small mesenteric multiloculated fluid collection posterior to the sigmoid colon which has slightly increased in size when compared to prior. Percutaneous drainage catheter has been removed in the interval.   Assessment/Plan:  38 y.o. male with diverticulitis with small foci of air, similar to previous episode, likely under-treated given him not completing Abx as prescribed   - Appreciate medicine admission  - Patient has been on regular diet this morning without issue; okay to continue   - I do think he likely has under-treated episode of diverticulitis given he did not complete the last 8 days of Abx. Would continue IV Abx here and transition back to PO Abx  - No indication for emergent surgical intervention at this time.   - I do not think he needs replacement of percutaneous drain   - Monitor abdominal examination  - Monitor leukocytosis; resolved   - Pain control prn  - Further management per primary service    - Discharge Planning: I think it is reasonable to stay today for IV Abx and  then transition back to PO Abx. Potentially home tomorrow or over the weekend.   All of the above findings and recommendations were discussed with the patient, and all of patient's questions were answered to his expressed satisfaction.  Thank you for the opportunity to participate in this patient's care.   -- Arthea Platt, PA-C [facility] 04/01/2024, 7:27 AM M-F: 7am - 4pm

## 2024-04-01 NOTE — Progress Notes (Signed)
 Initial Nutrition Assessment  DOCUMENTATION CODES:   Obesity unspecified  INTERVENTION:   -Continue regular diet -Ensure Plus High Protein po BID, each supplement provides 350 kcal and 20 grams of protein  -Continue 1 mg folic acid  -Continue MVI with minerals daily -Continue 100 mg thiamine  daily  NUTRITION DIAGNOSIS:   Increased nutrient needs related to acute illness as evidenced by estimated needs.  GOAL:   Patient will meet greater than or equal to 90% of their needs  MONITOR:   PO intake, Supplement acceptance  REASON FOR ASSESSMENT:   Consult Assessment of nutrition requirement/status, Poor PO  ASSESSMENT:   Pt with a PMH significant for alcohol dependence, diverticulitis with colon perforation.  Pt admitted with colonic diverticular abscess.   Reviewed I/O's: +480 ml x 24 hours  Per H&P, pt with history of compensated liver disease ans severe ETOH dependence; last drink was at noon on day admission. Typical ETOH intake is 1-2 fifths of vodka daily.   Noted pt was recently hospitalized on 9/7-9/16/25 for diverticulitis with abscess; drain was removed at d/c due to homelessness concerns and ability to manage drain. He was able to finish the last 8 days of prescribed antibiotics and his niece accidentally flushed them down the toilet.   Per general surgery notes, suspect under-treated episode of diverticulitis given he did not complete the last 8 days of antibiotics; plan to continue IV antibiotics and transition back to PO antibiotics.    Pt lying in bed at time of visit; hat covering his eyes. He did not arouse to voice or touch.   Pt currently on a regular diet. Noted meal completions 100%.   Wt has been stable.   Medications reviewed and include librium , folic acid , MVI, and thiamine .   Labs reviewed.   NUTRITION - FOCUSED PHYSICAL EXAM:  Flowsheet Row Most Recent Value  Orbital Region No depletion  Upper Arm Region No depletion  Thoracic and Lumbar  Region No depletion  Buccal Region No depletion  Temple Region No depletion  Clavicle Bone Region No depletion  Clavicle and Acromion Bone Region No depletion  Scapular Bone Region No depletion  Dorsal Hand No depletion  Patellar Region No depletion  Anterior Thigh Region No depletion  Posterior Calf Region No depletion  Edema (RD Assessment) Mild  Hair Reviewed  Eyes Reviewed  Mouth Reviewed  Skin Reviewed  Nails Reviewed    Diet Order:   Diet Order             Diet regular Room service appropriate? Yes; Fluid consistency: Thin  Diet effective now                   EDUCATION NEEDS:   No education needs have been identified at this time  Skin:  Skin Assessment: Reviewed RN Assessment  Last BM:  03/31/24  Height:   Ht Readings from Last 1 Encounters:  03/31/24 6' 1 (1.854 m)    Weight:   Wt Readings from Last 1 Encounters:  03/31/24 111.1 kg    Ideal Body Weight:  83.6 kg  BMI:  Body mass index is 32.32 kg/m.  Estimated Nutritional Needs:   Kcal:  2100-2300  Protein:  105-120 grams  Fluid:  2.1-2.3 L    Margery ORN, RD, LDN, CDCES Registered Dietitian III Certified Diabetes Care and Education Specialist If unable to reach this RD, please use RD Inpatient group chat on secure chat between hours of 8am-4 pm daily

## 2024-04-01 NOTE — TOC CM/SW Note (Signed)
 Transition of Care [facility]) - Inpatient Brief Assessment   Patient Details  Name: Christian Dickerson MRN: [id number] Date of Birth: Nov 07, 1985  [facility]) CM/SW Contact:    Alfonso Rummer, LCSW Phone Number: 04/01/2024, 9:48 AM   Clinical Narrative:  KEN DELENA Rummer completed TOC chart review. SDOH resources added to after visit summary. Please contact should further TOC needs arise.   Transition of Care Asessment:

## 2024-04-02 DIAGNOSIS — K5720 Diverticulitis of large intestine with perforation and abscess without bleeding: Secondary | ICD-10-CM | POA: Diagnosis not present

## 2024-04-02 DIAGNOSIS — K5732 Diverticulitis of large intestine without perforation or abscess without bleeding: Secondary | ICD-10-CM | POA: Diagnosis not present

## 2024-04-02 LAB — BASIC METABOLIC PANEL WITH GFR
Anion gap: 6 (ref 5–15)
BUN: 13 mg/dL (ref 6–20)
CO2: 26 mmol/L (ref 22–32)
Calcium: 8.9 mg/dL (ref 8.9–10.3)
Chloride: 107 mmol/L (ref 98–111)
Creatinine, Ser: 0.8 mg/dL (ref 0.61–1.24)
GFR, Estimated: >60 mL/min (ref >60)
Glucose, Bld: 106 mg/dL — ABNORMAL HIGH (ref 70–99)
Potassium: 4.2 mmol/L (ref 3.5–5.1)
Sodium: 139 mmol/L (ref 135–145)

## 2024-04-02 LAB — PHOSPHORUS: Phosphorus: 4.2 mg/dL (ref 2.5–4.6)

## 2024-04-02 LAB — MAGNESIUM: Magnesium: 2.3 mg/dL (ref 1.7–2.4)

## 2024-04-02 NOTE — Plan of Care (Signed)
  Problem: Clinical Measurements: Goal: Ability to maintain clinical measurements within normal limits will improve Outcome: Progressing   Problem: Pain Managment: Goal: General experience of comfort will improve and/or be controlled Outcome: Progressing   Problem: Safety: Goal: Ability to remain free from injury will improve Outcome: Progressing   Problem: Skin Integrity: Goal: Risk for impaired skin integrity will decrease Outcome: Progressing

## 2024-04-02 NOTE — Plan of Care (Signed)
  Problem: Health Behavior/Discharge Planning: Goal: Ability to manage health-related needs will improve Outcome: Progressing   Problem: Clinical Measurements: Goal: Ability to maintain clinical measurements within normal limits will improve Outcome: Progressing Goal: Will remain free from infection Outcome: Progressing Goal: Diagnostic test results will improve Outcome: Progressing Goal: Cardiovascular complication will be avoided Outcome: Progressing   Problem: Elimination: Goal: Will not experience complications related to bowel motility Outcome: Progressing Goal: Will not experience complications related to urinary retention Outcome: Progressing

## 2024-04-02 NOTE — Progress Notes (Addendum)
 [facility] SURGICAL PROGRESS NOTE (cpt [phone])  Hospital Day(s): 2.   Post op day(s):  Christian Dickerson   Interval History: Patient seen and examined, no acute events or new complaints overnight. Patient reports tolerance of diet without issue, denies any worsening pain, with reports of diminished tenderness. Reported bowel activity.  Review of Systems:  Constitutional: denies fever, chills  HEENT: denies cough or congestion  Respiratory: denies any shortness of breath  Cardiovascular: denies chest pain or palpitations  Gastrointestinal: denies abdominal pain, N/V, or diarrhea/and bowel function as per interval history Genitourinary: denies burning with urination or urinary frequency Musculoskeletal: denies pain, decreased motor or sensation Integumentary: denies any other rashes or skin discolorations Neurological: denies HA or vision/hearing changes   Vital signs in last 24 hours: [min-max] current  Temp:  [97.5 F (36.4 C)-97.8 F (36.6 C)] 97.8 F (36.6 C) (09/27 0824) Pulse Rate:  [66-73] 69 (09/27 0824) Resp:  [16] 16 (09/27 0824) BP: (120-140)/(87-103) 140/103 (09/27 0824) SpO2:  [98 %-99 %] 98 % (09/27 0824)     Height: 6' 1 (185.4 cm) Weight: 111.1 kg BMI (Calculated): 32.33   Intake/Output last 2 shifts:  09/26 0701 - 09/27 0700 In: 960 [P.O.:660; IV Piggyback:300] Out: -    Physical Exam:  Constitutional: alert, cooperative and no distress  HENT: normocephalic without obvious abnormality  Eyes: PERRL, EOM's grossly intact and symmetric  Neuro: CN II - XII grossly intact and symmetric without deficit  Respiratory: breathing non-labored at rest  Cardiovascular: regular rate and sinus rhythm  Gastrointestinal: soft, very mild left lower quadrant tenderness, and non-distended Musculoskeletal: UE and LE FROM, no edema or wounds, motor and sensation grossly intact, NT    Labs:     Latest Ref Rng & Units 04/01/2024    7:52 AM 03/31/2024    6:06 PM 03/16/2024     7:34 AM  CBC  WBC 4.0 - 10.5 K/uL 7.1  11.1  9.4   Hemoglobin 13.0 - 17.0 g/dL 85.0  82.9  84.8   Hematocrit 39.0 - 52.0 % 43.9  47.5  43.9   Platelets 150 - 400 K/uL 197  268  171       Latest Ref Rng & Units 04/02/2024    5:01 AM 04/01/2024    7:52 AM 03/31/2024    6:06 PM  CMP  Glucose 70 - 99 mg/dL 893  892  97   BUN 6 - 20 mg/dL 13  10  7    Creatinine 0.61 - 1.24 mg/dL 9.19  9.11  9.13   Sodium 135 - 145 mmol/L 139  139  138   Potassium 3.5 - 5.1 mmol/L 4.2  3.7  3.4   Chloride 98 - 111 mmol/L 107  102  101   CO2 22 - 32 mmol/L 26  23  20    Calcium 8.9 - 10.3 mg/dL 8.9  9.2  9.4   Total Protein 6.5 - 8.1 g/dL  7.4  8.8   Total Bilirubin 0.0 - 1.2 mg/dL  1.5  1.2   Alkaline Phos 38 - 126 U/L  72  91   AST 15 - 41 U/L  59  79   ALT 0 - 44 U/L  52  64      Imaging studies: No new pertinent imaging studies   Assessment/Plan:  38 y.o. male with history of incomplete antibiotic regimen for previously complicated diverticular abscess with prior drainage s/p return for readmission, complicated by pertinent comorbidities including: Social situation/homelessness/poor choices with  diet and continued alcohol intake. 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 medications as well.  He reports he has a place to stay until Wednesday next week.  - Challenging social situation, regarding discharge and follow-up as well.  - No plan for imminent surgical intervention.  - Hopefully he will can follow through with medical management, I will follow him up as an outpatient.  All of the above findings and recommendations were discussed with the patient, and the medical team, and all of patient's questions were answered to his expressed  satisfaction.  I personally spent a total of 40 minutes in the care of the patient today including preparing to see the patient, getting/reviewing separately obtained history, performing a medically appropriate exam/evaluation, counseling and educating, referring and communicating with other health care professionals, and documenting clinical information in the EHR.  -- Honor Leghorn M.D., [facility] 04/02/2024 12:17 PM

## 2024-04-02 NOTE — Progress Notes (Signed)
 Progress Note    Christian Dickerson  FMW:[id number] DOB: 1985-08-30  DOA: 03/31/2024 PCP: Patient, No Pcp Per      Brief Narrative:    Medical records reviewed and are as summarized below:  Christian Dickerson is a 38 y.o. male with medical history significant for recent hospitalization for diverticulitis with abscess requiring percutaneous drainage from 03/13/2024 through 03/22/2024, alcohol use disorder, tobacco use disorder, hypertension, previous falls with fractures, ankle surgery, chest tube insertion, tibia fracture surgery, tonsillectomy and adenoidectomy.  He was prescribed about 8 days worth of oral ciprofloxacin  and Flagyl  after he was discharged from the hospital on 03/22/2024.  However, he said he was staying at his brother's house and his niece flushed all the pills so he could not finish the course of antibiotics.  He presented to the hospital with abdominal pain and black stools.  He said he had not been able to eat much since he was discharged from the hospital.  CT abdomen pelvis showed persistent sigmoid colon diverticulitis.     Assessment/Plan:   Principal Problem:   Colonic diverticular abscess    Body mass index is 32.32 kg/m.  (Class I obesity)   Persistent sigmoid colon diverticulitis: Continue IV meropenem .  Analgesics as needed for pain.  He has been evaluated by general surgeon.  No indication for surgery or drain placement. Leukocytosis has improved.   Black stools: Improved. Drop in hemoglobin from 17-14.9.  No indication for blood transfusion.   Hypertension: Continue amlodipine    Alcohol use disorder: Continue Librium  use Ativan  as needed per CIWA protocol. Tobacco use disorder: Nicotine  patch as needed.   Disposition: Case was discussed with Dr. Lane, general surgeon, patient is okay for discharge from his standpoint.  Discussed discharge plans with the patient.  Patient said he does not feel too good today and prefers to stay in the  hospital longer today for IV antibiotics. He will likely be discharged home tomorrow on 10-day course of ciprofloxacin  and Flagyl .    Diet Order             Diet regular Room service appropriate? Yes; Fluid consistency: Thin  Diet effective now                                  Consultants: General Surgeon  Procedures: None    Medications:    chlordiazePOXIDE   25 mg Oral QID   feeding supplement  237 mL Oral BID BM   folic acid   1 mg Oral Daily   heparin   5,000 Units Subcutaneous Q8H   multivitamin with minerals  1 tablet Oral Daily   thiamine   100 mg Oral Daily   Or   thiamine   100 mg Intravenous Daily   Continuous Infusions:  meropenem  (MERREM ) IV 1 g (04/02/24 0503)     Anti-infectives (From admission, onward)    Start     Dose/Rate Route Frequency Ordered Stop   03/31/24 2015  meropenem  (MERREM ) 1 g in sodium chloride  0.9 % 100 mL IVPB        1 g 200 mL/hr over 30 Minutes Intravenous Every 8 hours 03/31/24 2000     03/31/24 1930  piperacillin -tazobactam (ZOSYN ) IVPB 3.375 g  Status:  Discontinued        3.375 g 12.5 mL/hr over 240 Minutes Intravenous  Once 03/31/24 1917 03/31/24 2001  Family Communication/Anticipated D/C date and plan/Code Status   DVT prophylaxis: heparin  injection 5,000 Units Start: 03/31/24 2200 SCDs Start: 03/31/24 1932     Code Status: Full Code  Family Communication: None Disposition Plan: Plan to discharge home   Status is: Inpatient Remains inpatient appropriate because: Diverticulitis       Subjective:   Interval events noted.  I don't feel too good.  He still has some abdominal pain.  No vomiting or black stools.  Objective:    Vitals:   04/01/24 1439 04/01/24 2012 04/02/24 0415 04/02/24 0824  BP: (!) 139/93 120/87 (!) 126/95 (!) 140/103  Pulse: 73 68 66 69  Resp: 16 16 16 16   Temp: 97.7 F (36.5 C) 97.8 F (36.6 C) (!) 97.5 F (36.4 C) 97.8 F (36.6 C)   TempSrc: Oral Oral Oral Oral  SpO2: 98% 99% 98% 98%  Weight:      Height:       No data found.   Intake/Output Summary (Last 24 hours) at 04/02/2024 1204 Last data filed at 04/01/2024 1912 Gross per 24 hour  Intake 480 ml  Output --  Net 480 ml   Filed Weights   03/31/24 1803  Weight: 111.1 kg    Exam:  GEN: NAD SKIN: Warm and dry EYES: No pallor or icterus ENT: MMM CV: RRR PULM: CTA B ABD: soft, ND, mild lower abdominal tenderness, no rebound tenderness or guarding, +BS CNS: AAO x 3, non focal EXT: No edema or tenderness       Data Reviewed:   I have personally reviewed following labs and imaging studies:  Labs: Labs show the following:   Basic Metabolic Panel: Recent Labs  Lab 03/31/24 1806 04/01/24 0752 04/02/24 0501  NA 138 139 139  K 3.4* 3.7 4.2  CL 101 102 107  CO2 20* 23 26  GLUCOSE 97 107* 106*  BUN 7 10 13   CREATININE 0.86 0.88 0.80  CALCIUM 9.4 9.2 8.9  MG  --  2.0 2.3  PHOS  --  4.2 4.2   GFR Estimated Creatinine Clearance: 163.6 mL/min (by C-G formula based on SCr of 0.8 mg/dL). Liver Function Tests: Recent Labs  Lab 03/31/24 1806 04/01/24 0752  AST 79* 59*  ALT 64* 52*  ALKPHOS 91 72  BILITOT 1.2 1.5*  PROT 8.8* 7.4  ALBUMIN 4.2 3.6   Recent Labs  Lab 03/31/24 1806  LIPASE 56*   No results for input(s): AMMONIA in the last 168 hours. Coagulation profile No results for input(s): INR, PROTIME in the last 168 hours.  CBC: Recent Labs  Lab 03/31/24 1806 04/01/24 0752  WBC 11.1* 7.1  NEUTROABS  --  3.1  HGB 17.0 14.9  HCT 47.5 43.9  MCV 100.6* 102.8*  PLT 268 197   Cardiac Enzymes: No results for input(s): CKTOTAL, CKMB, CKMBINDEX, TROPONINI in the last 168 hours. BNP (last 3 results) No results for input(s): PROBNP in the last 8760 hours. CBG: No results for input(s): GLUCAP in the last 168 hours. D-Dimer: No results for input(s): DDIMER in the last 72 hours. Hgb A1c: No results for  input(s): HGBA1C in the last 72 hours. Lipid Profile: No results for input(s): CHOL, HDL, LDLCALC, TRIG, CHOLHDL, LDLDIRECT in the last 72 hours. Thyroid function studies: No results for input(s): TSH, T4TOTAL, T3FREE, THYROIDAB in the last 72 hours.  Invalid input(s): FREET3 Anemia work up: No results for input(s): VITAMINB12, FOLATE, FERRITIN, TIBC, IRON, RETICCTPCT in the last 72 hours. Sepsis Labs: Recent Labs  Lab 03/31/24 1806 04/01/24 0752  WBC 11.1* 7.1    Microbiology No results found for this or any previous visit (from the past 240 hours).  Procedures and diagnostic studies:  CT ABDOMEN PELVIS W CONTRAST Result Date: 03/31/2024 CLINICAL DATA:  Diverticulitis complication. EXAM: CT ABDOMEN AND PELVIS WITH CONTRAST TECHNIQUE: Multidetector CT imaging of the abdomen and pelvis was performed using the standard protocol following bolus administration of intravenous contrast. RADIATION DOSE REDUCTION: This exam was performed according to the departmental dose-optimization program which includes automated exposure control, adjustment of the mA and/or kV according to patient size and/or use of iterative reconstruction technique. CONTRAST:  OMNIPAQUE  IOHEXOL  300 MG/ML  SOLN COMPARISON:  CT abdomen and pelvis 03/21/2024. FINDINGS: Lower chest: No acute abnormality. Hepatobiliary: No focal liver abnormality is seen. No gallstones, gallbladder wall thickening, or biliary dilatation. Pancreas: Unremarkable. No pancreatic ductal dilatation or surrounding inflammatory changes. Spleen: Normal in size without focal abnormality. Adrenals/Urinary Tract: Adrenal glands are unremarkable. Kidneys are normal, without renal calculi, focal lesion, or hydronephrosis. Bladder is unremarkable. Stomach/Bowel: Again seen is sigmoid colon diverticulosis. There is wall thickening of the proximal sigmoid colon with minimal surrounding inflammation similar to prior.  Posterior to the sigmoid colon there is a small mesenteric small multiloculated fluid collection with the largest component measuring 1.8 x 1.9 by 1.6 cm. This has slightly increased in size when compared to prior. The percutaneous drainage catheter has been removed in the interval. There is no bowel obstruction. The appendix is within normal limits. Small bowel and stomach are within normal limits. Vascular/Lymphatic: No significant vascular findings are present. No enlarged abdominal or pelvic lymph nodes. Reproductive: Prostate is unremarkable. Other: No ascites or free air. Musculoskeletal: Degenerative changes affect the spine. IMPRESSION: Persistent sigmoid colon diverticulitis. There is a small mesenteric multiloculated fluid collection posterior to the sigmoid colon which has slightly increased in size when compared to prior. Percutaneous drainage catheter has been removed in the interval. Electronically Signed   By: Greig Pique M.D.   On: 03/31/2024 19:04               LOS: 2 days   Kellon Chalk  [facility]   Pager on [email or web address]. If 7PM-7AM, please contact night-coverage at [email or web address]     04/02/2024, 12:04 PM

## 2024-04-03 DIAGNOSIS — K5720 Diverticulitis of large intestine with perforation and abscess without bleeding: Secondary | ICD-10-CM | POA: Diagnosis not present

## 2024-04-03 MED ORDER — CIPROFLOXACIN HCL 500 MG PO TABS: 500.0000 mg | ORAL_TABLET | Freq: Two times a day (BID) | ORAL | 0 refills | Status: DC

## 2024-04-03 MED ORDER — ACETAMINOPHEN 325 MG PO TABS: 650.0000 mg | ORAL_TABLET | Freq: Four times a day (QID) | ORAL | Status: DC | PRN

## 2024-04-03 MED ORDER — AMLODIPINE BESYLATE 5 MG PO TABS: 5.0000 mg | ORAL_TABLET | Freq: Every day | ORAL | 0 refills | Status: DC

## 2024-04-03 MED ORDER — CHLORDIAZEPOXIDE HCL 10 MG PO CAPS
10.0000 mg | ORAL_CAPSULE | Freq: Three times a day (TID) | ORAL | Status: DC
  Administered 2024-04-03: 10 mg via ORAL
  Filled 2024-04-03: qty 1

## 2024-04-03 MED ORDER — METRONIDAZOLE 500 MG PO TABS: 500.0000 mg | ORAL_TABLET | Freq: Two times a day (BID) | ORAL | 0 refills | Status: DC

## 2024-04-03 NOTE — Discharge Summary (Signed)
 Physician Discharge Summary   Patient: Christian Dickerson MRN: [id number] DOB: April 27, 1986  Admit date:     03/31/2024  Discharge date: 04/03/24  Discharge Physician: AIDA CHO   PCP: Patient, No Pcp Per   Recommendations at discharge:   Follow-up with Dr. Lane, general surgeon, on 04/12/2024 as scheduled Follow-up with PCP in 1 to 2 weeks  Discharge Diagnoses: Principal Problem:   Colonic diverticular abscess  Resolved Problems:   * No resolved hospital problems. *  Hospital Course:  Christian Dickerson is a 37 y.o. male with medical history significant for recent hospitalization for diverticulitis with abscess requiring percutaneous drainage from 03/13/2024 through 03/22/2024, alcohol use disorder, tobacco use disorder, hypertension, previous falls with fractures, ankle surgery, chest tube insertion, tibia fracture surgery, tonsillectomy and adenoidectomy.  He was prescribed about 8 days worth of oral ciprofloxacin  and Flagyl  after he was discharged from the hospital on 03/22/2024.  However, he said he was staying at his brother's house and his niece flushed all the pills so he could not finish the course of antibiotics.   He presented to the hospital with abdominal pain and black stools.  He said he had not been able to eat much since he was discharged from the hospital.   CT abdomen pelvis showed persistent sigmoid colon diverticulitis.   Assessment and Plan:  Persistent sigmoid colon diverticulitis: Improved.  He will be discharged on ciprofloxacin  and Flagyl  for 10 days as recommended by Dr. Lane, general surgeon.   Leukocytosis has improved.     Black stools: Improved. Drop in hemoglobin from 17-14.9.  No indication for blood transfusion.     Hypertension: Continue amlodipine  at discharge     Alcohol use disorder: Counseled to stop drinking alcohol.  He declined Librium  to help reduce the risk of alcohol withdrawal after discharge from the hospital.   Tobacco use  disorder: Counseled to quit smoking cigarettes.    His condition has improved and he is deemed stable for discharge to home today.       Consultants: General Surgeon Procedures performed: None Disposition: Home Diet recommendation:  Discharge Diet Orders (From admission, onward)     Start     Ordered   04/03/24 0000  Diet - low sodium heart healthy        04/03/24 1037           Cardiac diet DISCHARGE MEDICATION: Allergies as of 04/03/2024   No Known Allergies      Medication List     TAKE these medications    amLODipine  5 MG tablet Commonly known as: NORVASC  Take 1 tablet (5 mg total) by mouth daily.   ciprofloxacin  500 MG tablet Commonly known as: CIPRO  Take 1 tablet (500 mg total) by mouth 2 (two) times daily for 10 days.   metroNIDAZOLE  500 MG tablet Commonly known as: FLAGYL  Take 1 tablet (500 mg total) by mouth 2 (two) times daily for 10 days.        Follow-up Information     Lane Shope, MD Follow up on 04/12/2024.   Specialty: General Surgery Contact information: [address] [place] [address] [phone]                Discharge Exam: Christian Dickerson   03/31/24 1803  Weight: 111.1 kg   GEN: NAD SKIN: Warm and dry EYES: No pallor or icterus ENT: MMM CV: RRR PULM: CTA B ABD: soft, obese, NT, +BS CNS: AAO x 3, non focal EXT: No  edema or tenderness   Condition at discharge: good  The results of significant diagnostics from this hospitalization (including imaging, microbiology, ancillary and laboratory) are listed below for reference.   Imaging Studies: CT ABDOMEN PELVIS W CONTRAST Result Date: 03/31/2024 CLINICAL DATA:  Diverticulitis complication. EXAM: CT ABDOMEN AND PELVIS WITH CONTRAST TECHNIQUE: Multidetector CT imaging of the abdomen and pelvis was performed using the standard protocol following bolus administration of intravenous contrast. RADIATION DOSE REDUCTION: This exam was performed  according to the departmental dose-optimization program which includes automated exposure control, adjustment of the mA and/or kV according to patient size and/or use of iterative reconstruction technique. CONTRAST:  OMNIPAQUE  IOHEXOL  300 MG/ML  SOLN COMPARISON:  CT abdomen and pelvis 03/21/2024. FINDINGS: Lower chest: No acute abnormality. Hepatobiliary: No focal liver abnormality is seen. No gallstones, gallbladder wall thickening, or biliary dilatation. Pancreas: Unremarkable. No pancreatic ductal dilatation or surrounding inflammatory changes. Spleen: Normal in size without focal abnormality. Adrenals/Urinary Tract: Adrenal glands are unremarkable. Kidneys are normal, without renal calculi, focal lesion, or hydronephrosis. Bladder is unremarkable. Stomach/Bowel: Again seen is sigmoid colon diverticulosis. There is wall thickening of the proximal sigmoid colon with minimal surrounding inflammation similar to prior. Posterior to the sigmoid colon there is a small mesenteric small multiloculated fluid collection with the largest component measuring 1.8 x 1.9 by 1.6 cm. This has slightly increased in size when compared to prior. The percutaneous drainage catheter has been removed in the interval. There is no bowel obstruction. The appendix is within normal limits. Small bowel and stomach are within normal limits. Vascular/Lymphatic: No significant vascular findings are present. No enlarged abdominal or pelvic lymph nodes. Reproductive: Prostate is unremarkable. Other: No ascites or free air. Musculoskeletal: Degenerative changes affect the spine. IMPRESSION: Persistent sigmoid colon diverticulitis. There is a small mesenteric multiloculated fluid collection posterior to the sigmoid colon which has slightly increased in size when compared to prior. Percutaneous drainage catheter has been removed in the interval. Electronically Signed   By: Greig Pique M.D.   On: 03/31/2024 19:04   CT ABDOMEN PELVIS W  CONTRAST Result Date: 03/21/2024 CLINICAL DATA:  Follow-up perforated diverticulitis status post drain placement EXAM: CT ABDOMEN AND PELVIS WITH CONTRAST TECHNIQUE: Multidetector CT imaging of the abdomen and pelvis was performed using the standard protocol following bolus administration of intravenous contrast. RADIATION DOSE REDUCTION: This exam was performed according to the departmental dose-optimization program which includes automated exposure control, adjustment of the mA and/or kV according to patient size and/or use of iterative reconstruction technique. CONTRAST:  OMNIPAQUE  IOHEXOL  300 MG/ML  SOLN COMPARISON:  CT percutaneous drainage and CT abdomen and pelvis dated 03/16/2024 FINDINGS: Lower chest: Unchanged scarring along the lateral right middle lobe. No pleural effusion or pneumothorax demonstrated. Partially imaged heart size is normal. Coronary artery calcification along the LAD. Hepatobiliary: Subcentimeter segment 6 hypodensity (2:40), too small to characterize. No intra or extrahepatic biliary ductal dilation. Normal gallbladder. Pancreas: No focal lesions or main ductal dilation. Spleen: Enlarged spleen measures 14.9 cm, not substantially changed. Adrenals/Urinary Tract: No adrenal nodules. No suspicious renal mass, calculi or hydronephrosis. No focal bladder wall thickening. Stomach/Bowel: Normal appearance of the stomach. Short-segment circumferential mural thickening of the sigmoid colon adjacent to the diverticulitis. A 1.7 x 1.1 cm outpouching along the posterior aspect (2:84). Normal appendix. Vascular/Lymphatic: No significant vascular findings are present. Unchanged prominent 12 mm portacaval lymph node (2:39). Reproductive: Prostate is unremarkable. Other: Left lateral lower quadrant approach percutaneous drainage catheter is unchanged in position. No  discretely measurable fluid collection remains at the tip of the catheter. There are residual mesenteric stranding and punctate  foci of gas. Musculoskeletal: No acute or abnormal lytic or blastic osseous lesions. Multilevel degenerative changes of the partially imaged thoracic and lumbar spine. Small fat-containing right inguinal hernia. A few foci of left lower quadrant subcutaneous emphysema anterior and inferior to the percutaneous drainage catheter. IMPRESSION: 1. Left lateral lower quadrant approach percutaneous drainage catheter is unchanged in position. No discretely measurable fluid collection remains at the tip of the catheter. There are residual mesenteric stranding and punctate foci of gas. 2. Short-segment circumferential mural thickening of the sigmoid colon adjacent to the diverticulitis. A 1.7 x 1.1 cm outpouching along the posterior aspect of the sigmoid colon may represent the inflamed diverticulum versus contained perforation. 3. Unchanged splenomegaly. 4. Unchanged prominent 12 mm portacaval lymph node, likely reactive. 5. Coronary artery calcification along the LAD. Assessment for potential risk factor modification, dietary therapy or pharmacologic therapy may be warranted, if clinically indicated. Electronically Signed   By: Limin  Xu M.D.   On: 03/21/2024 13:27   CT GUIDED PERITONEAL/RETROPERITONEAL FLUID DRAIN BY PERC CATH Result Date: 03/17/2024 INDICATION: 38 year old male with perforated diverticulitis complicated by intra-abdominal abscess formation. He has failed conservative therapy and his abscess is progressing. Therefore, he presents for placement of a CT-guided drainage catheter. EXAM: CT-guided drain placement MEDICATIONS: The patient is currently admitted to the hospital and receiving intravenous antibiotics. The antibiotics were administered within an appropriate time frame prior to the initiation of the procedure. ANESTHESIA/SEDATION: Moderate (conscious) sedation was employed during this procedure. A total of Versed  2 mg and Fentanyl  100 mcg was administered intravenously by the radiology nurse. Total  intra-service moderate Sedation Time: 17 minutes. The patient's level of consciousness and vital signs were monitored continuously by radiology nursing throughout the procedure under my direct supervision. COMPLICATIONS: None immediate. PROCEDURE: Informed written consent was obtained from the patient after a thorough discussion of the procedural risks, benefits and alternatives. All questions were addressed. Maximal Sterile Barrier Technique was utilized including caps, mask, sterile gowns, sterile gloves, sterile drape, hand hygiene and skin antiseptic. A timeout was performed prior to the initiation of the procedure. A planning axial CT scan was performed. A suitable skin entry site in the left lower quadrant was identified and marked. The skin was sterilely prepped and draped in the standard fashion using chlorhexidine skin prep. Local anesthesia was attained by infiltration with 1% lidocaine . A small dermatotomy was made. Under intermittent CT guidance, an 18 gauge trocar needle was carefully advanced along the an avascular plane into the fluid collection. A 0.035 wire was then coiled in the fluid collection. The percutaneous tract was dilated to 12 [place]. A Cook 12 [place] all-purpose drainage catheter was advanced over the wire and formed. Aspiration yields 40 mL of foul-smelling purulent fluid. This was sent for Gram stain and culture. The catheter was then flushed and connected to JP bulb suction. The catheter was secured to the skin with 0 Prolene suture and bandages were applied. Post placement CT imaging demonstrates a well-positioned drainage catheter and significant reduction in the volume of intra-abdominal abscess. IMPRESSION: Successful placement of a 12 French drainage catheter via a left lower quadrant approach with evacuation of 40 mL thick foul-smelling purulent fluid. PLAN: Drain should be maintained to JP bulb suction for the first 24-48 hours and then transitioned to gravity bag drainage to  minimize the risk of fistula formation. Electronically Signed   By: Wilkie  Karalee M.D.   On: 03/17/2024 08:42   CT ABDOMEN PELVIS W CONTRAST Result Date: 03/16/2024 CLINICAL DATA:  Sigmoid diverticulitis. EXAM: CT ABDOMEN AND PELVIS WITH CONTRAST TECHNIQUE: Multidetector CT imaging of the abdomen and pelvis was performed using the standard protocol following bolus administration of intravenous contrast. RADIATION DOSE REDUCTION: This exam was performed according to the departmental dose-optimization program which includes automated exposure control, adjustment of the mA and/or kV according to patient size and/or use of iterative reconstruction technique. CONTRAST:  OMNIPAQUE  IOHEXOL  300 MG/ML  SOLN COMPARISON:  March 13, 2024. FINDINGS: Lower chest: No acute abnormality. Hepatobiliary: No focal liver abnormality is seen. No gallstones, gallbladder wall thickening, or biliary dilatation. Pancreas: Unremarkable. No pancreatic ductal dilatation or surrounding inflammatory changes. Spleen: Normal in size without focal abnormality. Adrenals/Urinary Tract: Adrenal glands and kidneys appear normal. Urinary bladder is decompressed Stomach/Bowel: The stomach and appendix are unremarkable. There is no evidence of bowel obstruction. There remains severe diverticulitis involving proximal sigmoid colon. There are now noted multiple fluid collections in the pericolonic fat superiorly to the inflamed segment of colon with small air-fluid levels suggesting developing abscesses. The largest measures 4.5 x 3.1 cm. Vascular/Lymphatic: No significant vascular findings are present. No enlarged abdominal or pelvic lymph nodes. Reproductive: Prostate is unremarkable. Other: Small fat containing right inguinal hernia is noted. Musculoskeletal: No acute or significant osseous findings. IMPRESSION: There remains severe diverticulitis involving proximal sigmoid colon. There are now noted multiple fluid collections in the  pericolonic fat superior to the inflamed segment of colon with small air-fluid levels suggesting developing abscesses. The largest measures 4.5 x 3.1 cm. Electronically Signed   By: Lynwood Landy Raddle M.D.   On: 03/16/2024 10:59   CT ANGIO GI BLEED Result Date: 03/13/2024 CLINICAL DATA:  Rectal bleeding, acute lower abdominal pain. EXAM: CTA ABDOMEN AND PELVIS WITHOUT AND WITH CONTRAST TECHNIQUE: Multidetector CT imaging of the abdomen and pelvis was performed using the standard protocol during bolus administration of intravenous contrast. Multiplanar reconstructed images and MIPs were obtained and reviewed to evaluate the vascular anatomy. RADIATION DOSE REDUCTION: This exam was performed according to the departmental dose-optimization program which includes automated exposure control, adjustment of the mA and/or kV according to patient size and/or use of iterative reconstruction technique. CONTRAST:  OMNIPAQUE  IOHEXOL  350 MG/ML SOLN COMPARISON:  April 29, 2019. FINDINGS: VASCULAR Aorta: Normal caliber aorta without aneurysm, dissection, vasculitis or significant stenosis. Celiac: Patent without evidence of aneurysm, dissection, vasculitis or significant stenosis. SMA: Patent without evidence of aneurysm, dissection, vasculitis or significant stenosis. Renals: Both renal arteries are patent without evidence of aneurysm, dissection, vasculitis, fibromuscular dysplasia or significant stenosis. IMA: Patent without evidence of aneurysm, dissection, vasculitis or significant stenosis. Inflow: Patent without evidence of aneurysm, dissection, vasculitis or significant stenosis. Proximal Outflow: Bilateral common femoral and visualized portions of the superficial and profunda femoral arteries are patent without evidence of aneurysm, dissection, vasculitis or significant stenosis. Veins: No obvious venous abnormality within the limitations of this arterial phase study. Review of the MIP images confirms the above  findings. NON-VASCULAR Lower chest: No acute abnormality. Hepatobiliary: No focal liver abnormality is seen. No gallstones, gallbladder wall thickening, or biliary dilatation. Pancreas: Unremarkable. No pancreatic ductal dilatation or surrounding inflammatory changes. Spleen: Normal in size without focal abnormality. Adrenals/Urinary Tract: Adrenal glands are unremarkable. Kidneys are normal, without renal calculi, focal lesion, or hydronephrosis. Bladder is unremarkable. Stomach/Bowel: Stomach and appendix are unremarkable. There is no evidence of bowel obstruction. Severe proximal sigmoid diverticulitis is  noted with a large amount of gas in the adjacent fat consistent with localized perforation. Lymphatic: No adenopathy is noted. Reproductive: Prostate is unremarkable. Other: Small fat containing right inguinal hernia.  No ascites. Musculoskeletal: No acute or significant osseous findings. IMPRESSION: Severe proximal sigmoid diverticulitis is noted with large amount of gas seen in the adjacent fat consistent with localized perforation. Critical Value/emergent results were called by telephone at the time of interpretation on 03/13/2024 at 4:41 pm to provider NEHA RAY , who verbally acknowledged these results. Small fat containing right inguinal hernia. No definite evidence of active gastrointestinal bleeding. Electronically Signed   By: Lynwood Landy Raddle M.D.   On: 03/13/2024 16:42    Microbiology: Results for orders placed or performed during the hospital encounter of 03/13/24  Blood Culture (routine x 2)     Status: None   Collection Time: 03/13/24  5:01 PM   Specimen: BLOOD LEFT HAND  Result Value Ref Range Status   Specimen Description BLOOD LEFT HAND  Final   Special Requests   Final    BOTTLES DRAWN AEROBIC AND ANAEROBIC Blood Culture results may not be optimal due to an inadequate volume of blood received in culture bottles   Culture   Final    NO GROWTH 5 DAYS Performed at [facility],  [address] [place], [place] [address]    Report Status 03/18/2024 FINAL  Final  Blood Culture (routine x 2)     Status: None   Collection Time: 03/13/24  5:01 PM   Specimen: BLOOD  Result Value Ref Range Status   Specimen Description BLOOD RIGHT ANTECUBITAL  Final   Special Requests   Final    BOTTLES DRAWN AEROBIC AND ANAEROBIC Blood Culture results may not be optimal due to an inadequate volume of blood received in culture bottles   Culture   Final    NO GROWTH 5 DAYS Performed at [facility], [address], [place], [place] [address]    Report Status 03/18/2024 FINAL  Final  Aerobic/Anaerobic Culture w Gram Stain (surgical/deep wound)     Status: None   Collection Time: 03/16/24  4:59 PM   Specimen: Abscess  Result Value Ref Range Status   Specimen Description   Final    ABSCESS Performed at [facility], [address], [place], [place] [address]    Special Requests   Final    Normal Performed at [facility], [address] [place], [place] [address]    Gram Stain   Final    ABUNDANT WBC PRESENT, PREDOMINANTLY PMN FEW GRAM NEGATIVE RODS FEW GRAM POSITIVE COCCI FEW GRAM POSITIVE RODS    Culture   Final    ABUNDANT ESCHERICHIA COLI ABUNDANT BACTEROIDES SPECIES BETA LACTAMASE NEGATIVE ABUNDANT BACTEROIDES THETAIOTAOMICRON BETA LACTAMASE POSITIVE Performed at [facility], [address] [place], [place] [address]    Report Status 03/20/2024 FINAL  Final   Organism ID, Bacteria ESCHERICHIA COLI  Final      Susceptibility   Escherichia coli - MIC*    AMPICILLIN >=32 RESISTANT Resistant     CEFAZOLIN (NON-URINE) >=32 RESISTANT Resistant     CEFEPIME <=0.12 SENSITIVE Sensitive     ERTAPENEM <=0.12 SENSITIVE Sensitive     CEFTRIAXONE  8 RESISTANT Resistant     CIPROFLOXACIN  <=0.06 SENSITIVE Sensitive     GENTAMICIN <=1 SENSITIVE Sensitive     MEROPENEM  <=0.25 SENSITIVE Sensitive     TRIMETH/SULFA <=20 SENSITIVE Sensitive  AMPICILLIN/SULBACTAM >=32 RESISTANT Resistant     PIP/TAZO Value in next row Resistant ug/mL     >=128 RESISTANTThis is a modified FDA-approved test that has been validated and its performance characteristics determined by the reporting laboratory.  This laboratory is certified under the Clinical Laboratory Improvement Amendments CLIA as qualified to perform high complexity clinical laboratory testing.    * ABUNDANT ESCHERICHIA COLI    Labs: CBC: Recent Labs  Lab 03/31/24 1806 04/01/24 0752  WBC 11.1* 7.1  NEUTROABS  --  3.1  HGB 17.0 14.9  HCT 47.5 43.9  MCV 100.6* 102.8*  PLT 268 197   Basic Metabolic Panel: Recent Labs  Lab 03/31/24 1806 04/01/24 0752 04/02/24 0501  NA 138 139 139  K 3.4* 3.7 4.2  CL 101 102 107  CO2 20* 23 26  GLUCOSE 97 107* 106*  BUN 7 10 13   CREATININE 0.86 0.88 0.80  CALCIUM 9.4 9.2 8.9  MG  --  2.0 2.3  PHOS  --  4.2 4.2   Liver Function Tests: Recent Labs  Lab 03/31/24 1806 04/01/24 0752  AST 79* 59*  ALT 64* 52*  ALKPHOS 91 72  BILITOT 1.2 1.5*  PROT 8.8* 7.4  ALBUMIN 4.2 3.6   CBG: No results for input(s): GLUCAP in the last 168 hours.  Discharge time spent: greater than 30 minutes.  Signed: AIDA CHO, MD [facility] 04/03/2024

## 2024-04-03 NOTE — Progress Notes (Signed)
 Discharge instructions were reviewed with patient. Questions were answered and encouraged. IV was taken out and belongings collected by patient.

## 2024-04-06 ENCOUNTER — Emergency Department
Admission: EM | Admit: 2024-04-06 | Discharge: 2024-04-06 | Disposition: A | Discharge status: Home / Self Care | Attending: Emergency Medicine | Admitting: Emergency Medicine

## 2024-04-06 ENCOUNTER — Other Ambulatory Visit: Payer: Self-pay

## 2024-04-06 ENCOUNTER — Emergency Department
Admission: EM | Admit: 2024-04-06 | Discharge: 2024-04-06 | Disposition: A | Discharge status: Home / Self Care | Source: Home / Self Care | Attending: Emergency Medicine | Admitting: Emergency Medicine

## 2024-04-06 DIAGNOSIS — F1020 Alcohol dependence, uncomplicated: Secondary | ICD-10-CM | POA: Diagnosis present

## 2024-04-06 DIAGNOSIS — F1090 Alcohol use, unspecified, uncomplicated: Secondary | ICD-10-CM | POA: Diagnosis present

## 2024-04-06 DIAGNOSIS — F17210 Nicotine dependence, cigarettes, uncomplicated: Secondary | ICD-10-CM | POA: Insufficient documentation

## 2024-04-06 DIAGNOSIS — Y908 Blood alcohol level of 240 mg/100 ml or more: Secondary | ICD-10-CM | POA: Insufficient documentation

## 2024-04-06 HISTORY — DX: Alcohol abuse, uncomplicated: F10.10

## 2024-04-06 LAB — COMPREHENSIVE METABOLIC PANEL WITH GFR
ALT: 47 U/L — ABNORMAL HIGH (ref 0–44)
AST: 50 U/L — ABNORMAL HIGH (ref 15–41)
Albumin: 3.9 g/dL (ref 3.5–5.0)
Alkaline Phosphatase: 90 U/L (ref 38–126)
Anion gap: 13 (ref 5–15)
BUN: 8 mg/dL (ref 6–20)
CO2: 25 mmol/L (ref 22–32)
Calcium: 9.5 mg/dL (ref 8.9–10.3)
Chloride: 106 mmol/L (ref 98–111)
Creatinine, Ser: 0.64 mg/dL (ref 0.61–1.24)
GFR, Estimated: >60 mL/min (ref >60)
Glucose, Bld: 114 mg/dL — ABNORMAL HIGH (ref 70–99)
Potassium: 3.9 mmol/L (ref 3.5–5.1)
Sodium: 144 mmol/L (ref 135–145)
Total Bilirubin: 0.6 mg/dL (ref 0.0–1.2)
Total Protein: 7.7 g/dL (ref 6.5–8.1)

## 2024-04-06 LAB — CBC WITH DIFFERENTIAL/PLATELET
Abs Immature Granulocytes: 0.02 K/uL (ref 0.00–0.07)
Basophils Absolute: 0.1 K/uL (ref 0.0–0.1)
Basophils Relative: 1 %
Eosinophils Absolute: 0.4 K/uL (ref 0.0–0.5)
Eosinophils Relative: 6 %
HCT: 44.6 % (ref 39.0–52.0)
Hemoglobin: 15.2 g/dL (ref 13.0–17.0)
Immature Granulocytes: 0 %
Lymphocytes Relative: 48 %
Lymphs Abs: 3.6 K/uL (ref 0.7–4.0)
MCH: 34.9 pg — ABNORMAL HIGH (ref 26.0–34.0)
MCHC: 34.1 g/dL (ref 30.0–36.0)
MCV: 102.3 fL — ABNORMAL HIGH (ref 80.0–100.0)
Monocytes Absolute: 0.9 K/uL (ref 0.1–1.0)
Monocytes Relative: 13 %
Neutro Abs: 2.3 K/uL (ref 1.7–7.7)
Neutrophils Relative %: 32 %
Platelets: 174 K/uL (ref 150–400)
RBC: 4.36 MIL/uL (ref 4.22–5.81)
RDW: 12.9 % (ref 11.5–15.5)
WBC: 7.3 K/uL (ref 4.0–10.5)
nRBC: 0 % (ref 0.0–0.2)

## 2024-04-06 LAB — ETHANOL: Alcohol, Ethyl (B): 309 mg/dL (ref <15)

## 2024-04-06 MED ORDER — LORAZEPAM 2 MG/ML IJ SOLN
0.0000 mg | Freq: Four times a day (QID) | INTRAMUSCULAR | Status: DC
  Administered 2024-04-06: 2 mg via INTRAVENOUS
  Filled 2024-04-06: qty 1

## 2024-04-06 MED ORDER — THIAMINE HCL 100 MG/ML IJ SOLN
100.0000 mg | Freq: Every day | INTRAMUSCULAR | Status: DC
Start: 2024-04-06 — End: 2024-04-06
  Administered 2024-04-06: 100 mg via INTRAVENOUS
  Filled 2024-04-06: qty 2

## 2024-04-06 MED ORDER — THIAMINE MONONITRATE 100 MG PO TABS: 100.0000 mg | ORAL_TABLET | Freq: Every day | ORAL | Status: DC

## 2024-04-06 MED ORDER — LORAZEPAM 2 MG PO TABS: 0.0000 mg | ORAL_TABLET | Freq: Four times a day (QID) | ORAL | Status: DC

## 2024-04-06 MED ORDER — NICOTINE 21 MG/24HR TD PT24
21.0000 mg | MEDICATED_PATCH | Freq: Once | TRANSDERMAL | Status: DC
  Administered 2024-04-06: 21 mg via TRANSDERMAL
  Filled 2024-04-06: qty 1

## 2024-04-06 MED ORDER — LORAZEPAM 2 MG/ML IJ SOLN: 0.0000 mg | Freq: Two times a day (BID) | INTRAMUSCULAR | Status: DC

## 2024-04-06 MED ORDER — LORAZEPAM 2 MG PO TABS: 0.0000 mg | ORAL_TABLET | Freq: Two times a day (BID) | ORAL | Status: DC

## 2024-04-06 NOTE — ED Triage Notes (Signed)
 Patient sent from [facility] for alcohol detox; states he has drank 2 fifths a day for the past 9 years.

## 2024-04-06 NOTE — ED Notes (Addendum)
 Pt asked this nurse to remove his IV so he can leave. Pt asked to wait for discharge papers and resources before leaving. Pt refused. MD aware. Pt seen walking to exit door. No distress noted and gait steady.

## 2024-04-06 NOTE — ED Notes (Addendum)
 Pt pacing down hallway and was seen sitting in chair at end of the hall by ED tech. Pt escorted back to his bed and encouraged to sit down. Pt sat and states I just want to go smoke and get my bottle out of the trash can that threw away before I came inside. Pt asked if he still wanted to quit drinking alcohol. Pt replies yes but its taking too long and it makes me want to go out and smoke and get my bottle out of the trash. Pt encouraged to think of different coping mechanisms to deal with his frustration and sit on his bed and eat lunch; pt refused. Pt told that smoking was not prohibited on this campus. Pt verbalized understanding. MD aware.

## 2024-04-06 NOTE — ED Notes (Signed)
 Pt attempting to walk towards exit door to smoke. Pt redirected back to bed and encouraged to stay for treatment. This RN explaining pt cannot leave with IV in nor step outside to smoke. Pt agreeable to to stay. EDP made aware to order nicotine  patch.

## 2024-04-06 NOTE — ED Notes (Addendum)
 Pt's friend Particia Pinal is leaving and will provide ride for pt upon discharge [phone]

## 2024-04-06 NOTE — ED Notes (Addendum)
 Pt pacing near his stretcher and pulling on his IV. Pt states he is wanting to go smoke a cigarette. Pt states he has been here a long time and everything is just taking too long. Pt encouraged to wait at his stretcher and offered his lunch and a beverage. Pt refused and states he is not hungry.

## 2024-04-06 NOTE — Discharge Instructions (Signed)
 Please proceed directly to [facility].  Return to the ER for any new or worsening symptoms that concern you.

## 2024-04-06 NOTE — ED Triage Notes (Signed)
 Patient was here earlier and left AMA to go smoke a cigarette; is now back and wanting help.

## 2024-04-06 NOTE — ED Notes (Signed)
 Pt up to nurses station attempting to pull IV out. This RN explaining if pt leaves to go outside and smoke the process starts over and pt will have to be triaged again. This RN encouraging pt to stay to continue to metabolize to freedom in order to go to [facility]. Pt agreeable at this time. Pt offered snacks and drink but pt declined.

## 2024-04-06 NOTE — ED Provider Notes (Signed)
 [facility] Provider Note    Event Date/Time   First MD Initiated Contact with Patient 04/06/24 1656     (approximate)   History   Alcohol Problem   HPI  Christian Dickerson is a 38 y.o. male with history of alcohol use who presents for clearance to go to [facility] for rehab.  The patient was seen in the ED this morning after presenting to [facility].  His alcohol level was too high to be excepted there, so they sent him to the ED.  The patient was being monitored to sobriety, but then decided to leave the ED due to wanting to smoke a cigarette.  He states that since leaving he drank a beer and a small amount of liquor.  He went back to [facility] and his BAC was significantly lower, in the 0.1 range, but [facility] would not accept him until he was formally discharged from the ED.  He has no new complaints.  I reviewed the past medical records and confirmed the history from the chart from this morning in the ED, as well as a recent inpatient admission to the hospitalist service for diverticular abscess, discharged on 9/28.   Physical Exam   Triage Vital Signs: ED Triage Vitals [04/06/24 1525]  Encounter Vitals Group     BP (!) 138/101     Girls Systolic BP Percentile      Girls Diastolic BP Percentile      Boys Systolic BP Percentile      Boys Diastolic BP Percentile      Pulse Rate (!) 107     Resp 18     Temp 98.1 F (36.7 C)     Temp Source Oral     SpO2 96 %     Weight      Height      Head Circumference      Peak Flow      Pain Score 0     Pain Loc      Pain Education      Exclude from Growth Chart     Most recent vital signs: Vitals:   04/06/24 1525 04/06/24 1659  BP: (!) 138/101 (!) 148/97  Pulse: (!) 107 95  Resp: 18 18  Temp: 98.1 F (36.7 C)   SpO2: 96% 100%     General: Alert and oriented, no distress.  CV:  Good peripheral perfusion.  Resp:  Normal effort.  Abd:  No distention.  Other:  Normal speech.  Normal gait.   ED Results / Procedures /  Treatments   Labs (all labs ordered are listed, but only abnormal results are displayed) Labs Reviewed - No data to display   EKG     RADIOLOGY    PROCEDURES:  Critical Care performed: No  Procedures   MEDICATIONS ORDERED IN ED: Medications - No data to display   IMPRESSION / MDM / ASSESSMENT AND PLAN / ED COURSE  I reviewed the triage vital signs and the nursing notes.  Differential diagnosis includes, but is not limited to, alcohol use disorder.  The patient is clinically sober and appropriate for discharge from the ED.  There is no indication for further workup here.  He is not in acute withdrawal.  He plans to proceed directly to [facility] for treatment, and should be accepted now that his BAC is significantly lower than this morning.  I gave him strict return precautions and he expressed understanding.  Patient's presentation is most consistent with acute,  uncomplicated illness.     FINAL CLINICAL IMPRESSION(S) / ED DIAGNOSES   Final diagnoses:  Alcohol use disorder     Rx / DC Orders   ED Discharge Orders     None        Note:  This document was prepared using Dragon voice recognition software and may include unintentional dictation errors.    Jacolyn Pae, MD 04/06/24 1712

## 2024-04-06 NOTE — ED Provider Notes (Signed)
 [facility] Provider Note    Event Date/Time   First MD Initiated Contact with Patient 04/06/24 0930     (approximate)  History   Chief Complaint: Alcohol Problem  HPI  Christian Dickerson is a 38 y.o. male with a past Eckel history of alcohol abuse who presents to the emergency department hoping to detox from alcohol.  According to the patient for years he has been drinking 1/5 or more of alcohol per day.  He went to [facility] today for alcohol detox but due to his significant alcohol level he was sent to the emergency department.  Per patient he blew a 0.34 at [facility].  Patient admits to recent drinking, states he believes his last drink was about an hour ago.  Patient states he has lost his job he has lost his house he has lost his kids all due to alcoholism and he has a strong desire to stop drinking.  Patient has no medical complaints at this time.  Physical Exam   Triage Vital Signs: ED Triage Vitals  Encounter Vitals Group     BP 04/06/24 0906 (!) 153/109     Girls Systolic BP Percentile --      Girls Diastolic BP Percentile --      Boys Systolic BP Percentile --      Boys Diastolic BP Percentile --      Pulse Rate 04/06/24 0906 (!) 104     Resp 04/06/24 0906 20     Temp 04/06/24 0906 97.9 F (36.6 C)     Temp Source 04/06/24 0906 Oral     SpO2 04/06/24 0906 96 %     Weight 04/06/24 0907 270 lb (122.5 kg)     Height 04/06/24 0907 6' 2 (1.88 m)     Head Circumference --      Peak Flow --      Pain Score 04/06/24 0907 0     Pain Loc --      Pain Education --      Exclude from Growth Chart --     Most recent vital signs: Vitals:   04/06/24 0906  BP: (!) 153/109  Pulse: (!) 104  Resp: 20  Temp: 97.9 F (36.6 C)  SpO2: 96%    General: Awake, no distress.  CV:  Good peripheral perfusion.  Regular rate and rhythm  Resp:  Normal effort.  Equal breath sounds bilaterally.  Abd:  No distention.  Soft, nontender.  No rebound or guarding.  ED Results /  Procedures / Treatments   MEDICATIONS ORDERED IN ED: Medications - No data to display   IMPRESSION / MDM / ASSESSMENT AND PLAN / ED COURSE  I reviewed the triage vital signs and the nursing notes.  Patient's presentation is most consistent with acute presentation with potential threat to life or bodily function.  Patient presents to the emergency department for alcohol abuse hoping to detox from alcohol.  Patient went to [facility] but was unable to detox due to an elevated level.  We will check labs including a CBC chemistry alcohol level.  Will continue to closely monitor.  Will discuss with [facility] once the patient is clinically sober for possible detox.  We will place patient on [facility] protocols.  Patient's lab work has resulted showing a reassuring chemistry reassuring CBC.  Patient's alcohol level is elevated at 309.  Patient given a nicotine  patch.  We will continue to close monitor in the emergency department until more sober so that  the patient could be admitted to [facility] for further detox.  After approximately 4 hours in the emergency department patient has decided to leave.  Patient is here voluntarily and states he cannot be here any longer and he has to leave.  Nurse discharge the patient after removing his IV.  FINAL CLINICAL IMPRESSION(S) / ED DIAGNOSES   Alcohol use disorder   Note:  This document was prepared using Dragon voice recognition software and may include unintentional dictation errors.   Dorothyann Drivers, MD 04/06/24 1438

## 2024-04-08 ENCOUNTER — Emergency Department (EMERGENCY_DEPARTMENT_HOSPITAL)
Admission: EM | Admit: 2024-04-08 | Discharge: 2024-04-09 | Disposition: A | Discharge status: Other Acute Inpatient Hospital | Source: Home / Self Care | Attending: Emergency Medicine | Admitting: Emergency Medicine

## 2024-04-08 ENCOUNTER — Other Ambulatory Visit: Payer: Self-pay

## 2024-04-08 DIAGNOSIS — F1914 Other psychoactive substance abuse with psychoactive substance-induced mood disorder: Secondary | ICD-10-CM | POA: Insufficient documentation

## 2024-04-08 DIAGNOSIS — F10129 Alcohol abuse with intoxication, unspecified: Secondary | ICD-10-CM | POA: Insufficient documentation

## 2024-04-08 DIAGNOSIS — F1994 Other psychoactive substance use, unspecified with psychoactive substance-induced mood disorder: Secondary | ICD-10-CM | POA: Diagnosis present

## 2024-04-08 DIAGNOSIS — F10220 Alcohol dependence with intoxication, uncomplicated: Secondary | ICD-10-CM

## 2024-04-08 DIAGNOSIS — R45851 Suicidal ideations: Secondary | ICD-10-CM | POA: Insufficient documentation

## 2024-04-08 DIAGNOSIS — R45850 Homicidal ideations: Secondary | ICD-10-CM

## 2024-04-08 DIAGNOSIS — Y908 Blood alcohol level of 240 mg/100 ml or more: Secondary | ICD-10-CM | POA: Insufficient documentation

## 2024-04-08 DIAGNOSIS — F1024 Alcohol dependence with alcohol-induced mood disorder: Secondary | ICD-10-CM | POA: Diagnosis present

## 2024-04-08 LAB — COMPREHENSIVE METABOLIC PANEL WITH GFR
ALT: 45 U/L — ABNORMAL HIGH (ref 0–44)
AST: 53 U/L — ABNORMAL HIGH (ref 15–41)
Albumin: 4.4 g/dL (ref 3.5–5.0)
Alkaline Phosphatase: 111 U/L (ref 38–126)
Anion gap: 15 (ref 5–15)
BUN: 11 mg/dL (ref 6–20)
CO2: 20 mmol/L — ABNORMAL LOW (ref 22–32)
Calcium: 9 mg/dL (ref 8.9–10.3)
Chloride: 107 mmol/L (ref 98–111)
Creatinine, Ser: 0.79 mg/dL (ref 0.61–1.24)
GFR, Estimated: >60 mL/min (ref >60)
Glucose, Bld: 97 mg/dL (ref 70–99)
Potassium: 3.7 mmol/L (ref 3.5–5.1)
Sodium: 142 mmol/L (ref 135–145)
Total Bilirubin: 0.7 mg/dL (ref 0.0–1.2)
Total Protein: 8.7 g/dL — ABNORMAL HIGH (ref 6.5–8.1)

## 2024-04-08 LAB — ETHANOL: Alcohol, Ethyl (B): 354 mg/dL (ref <15)

## 2024-04-08 LAB — CBC
HCT: 46.3 % (ref 39.0–52.0)
Hemoglobin: 16 g/dL (ref 13.0–17.0)
MCH: 35 pg — ABNORMAL HIGH (ref 26.0–34.0)
MCHC: 34.6 g/dL (ref 30.0–36.0)
MCV: 101.3 fL — ABNORMAL HIGH (ref 80.0–100.0)
Platelets: 199 K/uL (ref 150–400)
RBC: 4.57 MIL/uL (ref 4.22–5.81)
RDW: 12.8 % (ref 11.5–15.5)
WBC: 8.9 K/uL (ref 4.0–10.5)
nRBC: 0 % (ref 0.0–0.2)

## 2024-04-08 MED ORDER — LORAZEPAM 2 MG/ML IJ SOLN: 1.0000 mg | INTRAMUSCULAR | Status: DC | PRN

## 2024-04-08 MED ORDER — NICOTINE 21 MG/24HR TD PT24
21.0000 mg | MEDICATED_PATCH | Freq: Every day | TRANSDERMAL | Status: DC
Start: 2024-04-09 — End: 2024-04-08

## 2024-04-08 MED ORDER — LORAZEPAM 1 MG PO TABS
1.0000 mg | ORAL_TABLET | ORAL | Status: DC | PRN
  Administered 2024-04-08 – 2024-04-09 (×2): 2 mg via ORAL
  Filled 2024-04-08 (×2): qty 2

## 2024-04-08 MED ORDER — NICOTINE 21 MG/24HR TD PT24
21.0000 mg | MEDICATED_PATCH | Freq: Every day | TRANSDERMAL | Status: DC
  Administered 2024-04-08 – 2024-04-09 (×2): 21 mg via TRANSDERMAL
  Filled 2024-04-08 (×2): qty 1

## 2024-04-08 NOTE — ED Provider Notes (Addendum)
 [facility] Provider Note    Event Date/Time   First MD Initiated Contact with Patient 04/08/24 2305     (approximate)   History   Psychiatric Evaluation   HPI  Christian Dickerson is a 38 y.o. male past medical history significant for alcohol use disorder, who presents to the emergency department with suicidal statements.  Patient states that he was intoxicated tonight and made statements.  Patient arrived to the emergency department by law enforcement with IVC papers in place for suicidal and homicidal statements.  Stated that he initially wanted to hang himself and that he was going to have lawn for cement shoot him and that he was going to kill the first person that he met.  Patient does endorse significant alcohol use.  Last drink alcohol was tonight.     Physical Exam   Triage Vital Signs: ED Triage Vitals  Encounter Vitals Group     BP 04/08/24 2211 (!) 156/103     Girls Systolic BP Percentile --      Girls Diastolic BP Percentile --      Boys Systolic BP Percentile --      Boys Diastolic BP Percentile --      Pulse Rate 04/08/24 2211 (!) 102     Resp 04/08/24 2211 20     Temp 04/08/24 2211 98.3 F (36.8 C)     Temp Source 04/08/24 2211 Oral     SpO2 04/08/24 2211 100 %     Weight 04/08/24 2213 270 lb (122.5 kg)     Height 04/08/24 2213 6' 1 (1.854 m)     Head Circumference --      Peak Flow --      Pain Score 04/08/24 2212 1     Pain Loc --      Pain Education --      Exclude from Growth Chart --     Most recent vital signs: Vitals:   04/08/24 2211  BP: (!) 156/103  Pulse: (!) 102  Resp: 20  Temp: 98.3 F (36.8 C)  SpO2: 100%    Physical Exam Constitutional:      Appearance: He is well-developed.     Comments: Slurring of speech  HENT:     Head: Atraumatic.  Eyes:     Conjunctiva/sclera: Conjunctivae normal.  Cardiovascular:     Rate and Rhythm: Regular rhythm.  Pulmonary:     Effort: No respiratory distress.   Musculoskeletal:     Cervical back: Normal range of motion.  Skin:    General: Skin is warm.  Neurological:     Mental Status: He is alert. Mental status is at baseline.     GCS: GCS eye subscore is 4. GCS verbal subscore is 5. GCS motor subscore is 6.     Cranial Nerves: Cranial nerves 2-12 are intact.     Sensory: Sensation is intact.     Motor: Motor function is intact.  Psychiatric:        Thought Content: Thought content includes suicidal ideation. Thought content includes suicidal plan.     IMPRESSION / MDM / ASSESSMENT AND PLAN / ED COURSE  I reviewed the triage vital signs and the nursing notes.  Differential diagnosis including depression, suicidal ideation, intoxication, electrolyte abnormality   LABS (all labs ordered are listed, but only abnormal results are displayed) Labs interpreted as -    Labs Reviewed  COMPREHENSIVE METABOLIC PANEL WITH GFR - Abnormal; Notable for the following components:  Result Value   CO2 20 (*)    Total Protein 8.7 (*)    AST 53 (*)    ALT 45 (*)    All other components within normal limits  ETHANOL - Abnormal; Notable for the following components:   Alcohol, Ethyl (B) 354 (*)    All other components within normal limits  CBC - Abnormal; Notable for the following components:   MCV 101.3 (*)    MCH 35.0 (*)    All other components within normal limits  URINE DRUG SCREEN, QUALITATIVE ([facility] ONLY) - Abnormal; Notable for the following components:   Cannabinoid 50 Ng, Ur [place] POSITIVE (*)    Barbiturates, Ur Screen POSITIVE (*)    Benzodiazepine, Ur Scrn POSITIVE (*)    All other components within normal limits    MDM  IVC paperwork was upheld given patient with ongoing intoxication and concern for suicidal ideation.  On chart review patient has been in the emergency department recently with plans to go to rehab.  Alcohol level 354.  UDS positive for barbiturates, benzos and cannabinoids.     The patient has been placed in  psychiatric observation due to the need to provide a safe environment for the patient while obtaining psychiatric consultation and evaluation, as well as ongoing medical and medication management to treat the patient's condition.  The patient has been placed under full IVC at this time.   PROCEDURES:  Critical Care performed: No  Procedures  Patient's presentation is most consistent with acute presentation with potential threat to life or bodily function.   MEDICATIONS ORDERED IN ED: Medications  LORazepam  (ATIVAN ) tablet 1-4 mg (2 mg Oral Given 04/08/24 2354)    Or  LORazepam  (ATIVAN ) injection 1-4 mg ( Intravenous See Alternative 04/08/24 2354)  nicotine  (NICODERM CQ  - dosed in mg/24 hours) patch 21 mg (21 mg Transdermal Patch Applied 04/08/24 2310)    FINAL CLINICAL IMPRESSION(S) / ED DIAGNOSES   Final diagnoses:  Acute alcoholic intoxication in alcoholism without complication (HCC)  Suicidal ideation     Rx / DC Orders   ED Discharge Orders     None        Note:  This document was prepared using Dragon voice recognition software and may include unintentional dictation errors.   Suzanne Kirsch, MD 04/09/24 SHOSHANA    Suzanne Kirsch, MD 05/10/24 2349

## 2024-04-08 NOTE — ED Triage Notes (Signed)
 Patient brought in by law enforcement with IVC papers for suicidal and homicidal statements. Per papers patient stated that he wanted to hang himself, that he was going to make law enforcement shoot him, and that he was going to kill the first person he meets. Patient reports ETOH use.

## 2024-04-08 NOTE — ED Notes (Addendum)
 Patient to ED with [facility] PD under IVC.  Officer with patient reports that he called c-com and stated he was going to hang himself off a bridge.  When PD arrived patient continued to voice SI and when asked if he had a plan stated he was going to make them shoot him or he was going to stab himself with a needle and bleed out.  Patient also stated to PD that when he got out of the ED he was going to kill the first person he saw.  Patient was in ED recently to received help with alcohol detox and after getting sober when to [facility], states he was in a room with some guy who said he was a sex addict and he decided to leave.  Patient endoreses that he drinks daily, smokes a pk of cigarettes daily and smokes weed.  Patient still endorses si and states he doesn't understand why people won't just leave him alone so he can do it.  Patient reports recently lost his job, home and his car.

## 2024-04-08 NOTE — ED Provider Notes (Incomplete Revision)
 [facility] Provider Note    Event Date/Time   First MD Initiated Contact with Patient 04/08/24 2305     (approximate)   History   Psychiatric Evaluation   HPI  Christian Dickerson is a 38 y.o. male past medical history significant for alcohol use disorder, who presents to the emergency department with suicidal statements.  Patient states that he was intoxicated tonight and made statements.  Patient arrived to the emergency department by law enforcement with IVC papers in place for suicidal and homicidal statements.  Stated that he initially wanted to hang himself and that he was going to have lawn for cement shoot him and that he was going to kill the first person that he met.  Patient does endorse significant alcohol use.  Last drink alcohol was tonight.     Physical Exam   Triage Vital Signs: ED Triage Vitals  Encounter Vitals Group     BP 04/08/24 2211 (!) 156/103     Girls Systolic BP Percentile --      Girls Diastolic BP Percentile --      Boys Systolic BP Percentile --      Boys Diastolic BP Percentile --      Pulse Rate 04/08/24 2211 (!) 102     Resp 04/08/24 2211 20     Temp 04/08/24 2211 98.3 F (36.8 C)     Temp Source 04/08/24 2211 Oral     SpO2 04/08/24 2211 100 %     Weight 04/08/24 2213 270 lb (122.5 kg)     Height 04/08/24 2213 6' 1 (1.854 m)     Head Circumference --      Peak Flow --      Pain Score 04/08/24 2212 1     Pain Loc --      Pain Education --      Exclude from Growth Chart --     Most recent vital signs: Vitals:   04/08/24 2211  BP: (!) 156/103  Pulse: (!) 102  Resp: 20  Temp: 98.3 F (36.8 C)  SpO2: 100%    Physical Exam Constitutional:      Appearance: He is well-developed.     Comments: Slurring of speech  HENT:     Head: Atraumatic.  Eyes:     Conjunctiva/sclera: Conjunctivae normal.  Cardiovascular:     Rate and Rhythm: Regular rhythm.  Pulmonary:     Effort: No respiratory distress.   Musculoskeletal:     Cervical back: Normal range of motion.  Skin:    General: Skin is warm.  Neurological:     Mental Status: He is alert. Mental status is at baseline.     GCS: GCS eye subscore is 4. GCS verbal subscore is 5. GCS motor subscore is 6.     Cranial Nerves: Cranial nerves 2-12 are intact.     Sensory: Sensation is intact.     Motor: Motor function is intact.  Psychiatric:        Thought Content: Thought content includes suicidal ideation. Thought content includes suicidal plan.     IMPRESSION / MDM / ASSESSMENT AND PLAN / ED COURSE  I reviewed the triage vital signs and the nursing notes.  Differential diagnosis including depression, suicidal ideation, intoxication, electrolyte abnormality   LABS (all labs ordered are listed, but only abnormal results are displayed) Labs interpreted as -    Labs Reviewed  COMPREHENSIVE METABOLIC PANEL WITH GFR - Abnormal; Notable for the following components:  Result Value   CO2 20 (*)    Total Protein 8.7 (*)    AST 53 (*)    ALT 45 (*)    All other components within normal limits  ETHANOL - Abnormal; Notable for the following components:   Alcohol, Ethyl (B) 354 (*)    All other components within normal limits  CBC - Abnormal; Notable for the following components:   MCV 101.3 (*)    MCH 35.0 (*)    All other components within normal limits  URINE DRUG SCREEN, QUALITATIVE ([facility] ONLY) - Abnormal; Notable for the following components:   Cannabinoid 50 Ng, Ur [place] POSITIVE (*)    Barbiturates, Ur Screen POSITIVE (*)    Benzodiazepine, Ur Scrn POSITIVE (*)    All other components within normal limits    MDM  IVC paperwork was upheld given patient with ongoing intoxication and concern for suicidal ideation.  On chart review patient has been in the emergency department recently with plans to go to rehab.  Alcohol level 354.  UDS positive for barbiturates, benzos and cannabinoids.     The patient has been placed in  psychiatric observation due to the need to provide a safe environment for the patient while obtaining psychiatric consultation and evaluation, as well as ongoing medical and medication management to treat the patient's condition.  The patient has been placed under full IVC at this time.   PROCEDURES:  Critical Care performed: No  Procedures  Patient's presentation is most consistent with acute presentation with potential threat to life or bodily function.   MEDICATIONS ORDERED IN ED: Medications  LORazepam  (ATIVAN ) tablet 1-4 mg (2 mg Oral Given 04/08/24 2354)    Or  LORazepam  (ATIVAN ) injection 1-4 mg ( Intravenous See Alternative 04/08/24 2354)  nicotine  (NICODERM CQ  - dosed in mg/24 hours) patch 21 mg (21 mg Transdermal Patch Applied 04/08/24 2310)    FINAL CLINICAL IMPRESSION(S) / ED DIAGNOSES   Final diagnoses:  Acute alcoholic intoxication in alcoholism without complication (HCC)  Suicidal ideation     Rx / DC Orders   ED Discharge Orders     None        Note:  This document was prepared using Dragon voice recognition software and may include unintentional dictation errors.   Suzanne Kirsch, MD 04/09/24 [phone]

## 2024-04-09 ENCOUNTER — Inpatient Hospital Stay
Admission: AD | Admit: 2024-04-09 | Discharge: 2024-04-19 | DRG: 885 | Disposition: A | Discharge status: Home / Self Care | Source: Intra-hospital | Attending: Psychiatry | Admitting: Psychiatry

## 2024-04-09 ENCOUNTER — Encounter: Payer: Self-pay | Admitting: Psychiatry

## 2024-04-09 DIAGNOSIS — F10230 Alcohol dependence with withdrawal, uncomplicated: Secondary | ICD-10-CM | POA: Diagnosis present

## 2024-04-09 DIAGNOSIS — E66811 Obesity, class 1: Secondary | ICD-10-CM | POA: Diagnosis present

## 2024-04-09 DIAGNOSIS — Z653 Problems related to other legal circumstances: Secondary | ICD-10-CM

## 2024-04-09 DIAGNOSIS — Z91041 Radiographic dye allergy status: Secondary | ICD-10-CM

## 2024-04-09 DIAGNOSIS — Z91040 Latex allergy status: Secondary | ICD-10-CM | POA: Diagnosis not present

## 2024-04-09 DIAGNOSIS — F10220 Alcohol dependence with intoxication, uncomplicated: Secondary | ICD-10-CM

## 2024-04-09 DIAGNOSIS — Z604 Social exclusion and rejection: Secondary | ICD-10-CM | POA: Diagnosis present

## 2024-04-09 DIAGNOSIS — R45851 Suicidal ideations: Secondary | ICD-10-CM | POA: Diagnosis present

## 2024-04-09 DIAGNOSIS — Z560 Unemployment, unspecified: Secondary | ICD-10-CM | POA: Diagnosis not present

## 2024-04-09 DIAGNOSIS — Z818 Family history of other mental and behavioral disorders: Secondary | ICD-10-CM | POA: Diagnosis not present

## 2024-04-09 DIAGNOSIS — F1090 Alcohol use, unspecified, uncomplicated: Secondary | ICD-10-CM | POA: Diagnosis not present

## 2024-04-09 DIAGNOSIS — Z5982 Transportation insecurity: Secondary | ICD-10-CM

## 2024-04-09 DIAGNOSIS — F10229 Alcohol dependence with intoxication, unspecified: Secondary | ICD-10-CM | POA: Diagnosis present

## 2024-04-09 DIAGNOSIS — R45850 Homicidal ideations: Secondary | ICD-10-CM | POA: Diagnosis present

## 2024-04-09 DIAGNOSIS — F410 Panic disorder [episodic paroxysmal anxiety] without agoraphobia: Secondary | ICD-10-CM | POA: Diagnosis present

## 2024-04-09 DIAGNOSIS — Y908 Blood alcohol level of 240 mg/100 ml or more: Secondary | ICD-10-CM | POA: Diagnosis present

## 2024-04-09 DIAGNOSIS — F1020 Alcohol dependence, uncomplicated: Secondary | ICD-10-CM | POA: Diagnosis not present

## 2024-04-09 DIAGNOSIS — R079 Chest pain, unspecified: Secondary | ICD-10-CM | POA: Diagnosis not present

## 2024-04-09 DIAGNOSIS — Z5900 Homelessness unspecified: Secondary | ICD-10-CM | POA: Diagnosis not present

## 2024-04-09 DIAGNOSIS — Z79899 Other long term (current) drug therapy: Secondary | ICD-10-CM | POA: Diagnosis not present

## 2024-04-09 DIAGNOSIS — F1994 Other psychoactive substance use, unspecified with psychoactive substance-induced mood disorder: Secondary | ICD-10-CM | POA: Diagnosis present

## 2024-04-09 DIAGNOSIS — F17210 Nicotine dependence, cigarettes, uncomplicated: Secondary | ICD-10-CM | POA: Diagnosis present

## 2024-04-09 DIAGNOSIS — F1024 Alcohol dependence with alcohol-induced mood disorder: Secondary | ICD-10-CM | POA: Diagnosis present

## 2024-04-09 DIAGNOSIS — Z1152 Encounter for screening for COVID-19: Secondary | ICD-10-CM

## 2024-04-09 DIAGNOSIS — I10 Essential (primary) hypertension: Secondary | ICD-10-CM | POA: Diagnosis present

## 2024-04-09 DIAGNOSIS — R0789 Other chest pain: Secondary | ICD-10-CM | POA: Diagnosis not present

## 2024-04-09 DIAGNOSIS — F10239 Alcohol dependence with withdrawal, unspecified: Secondary | ICD-10-CM | POA: Diagnosis present

## 2024-04-09 DIAGNOSIS — F332 Major depressive disorder, recurrent severe without psychotic features: Secondary | ICD-10-CM | POA: Diagnosis present

## 2024-04-09 LAB — URINE DRUG SCREEN, QUALITATIVE (ARMC ONLY)
Amphetamines, Ur Screen: NOT DETECTED
Barbiturates, Ur Screen: POSITIVE — AB
Benzodiazepine, Ur Scrn: POSITIVE — AB
Cannabinoid 50 Ng, Ur ~~LOC~~: POSITIVE — AB
Cocaine Metabolite,Ur ~~LOC~~: NOT DETECTED
MDMA (Ecstasy)Ur Screen: NOT DETECTED
Methadone Scn, Ur: NOT DETECTED
Opiate, Ur Screen: NOT DETECTED
Phencyclidine (PCP) Ur S: NOT DETECTED
Tricyclic, Ur Screen: NOT DETECTED

## 2024-04-09 MED ORDER — CIPROFLOXACIN HCL 500 MG PO TABS
500.0000 mg | ORAL_TABLET | Freq: Two times a day (BID) | ORAL | Status: DC
  Administered 2024-04-09: 500 mg via ORAL
  Filled 2024-04-09: qty 1

## 2024-04-09 MED ORDER — ALUM & MAG HYDROXIDE-SIMETH 200-200-20 MG/5ML PO SUSP: 30.0000 mL | ORAL | Status: DC | PRN

## 2024-04-09 MED ORDER — METRONIDAZOLE 500 MG PO TABS
500.0000 mg | ORAL_TABLET | Freq: Two times a day (BID) | ORAL | Status: DC
  Administered 2024-04-09: 500 mg via ORAL
  Filled 2024-04-09: qty 1

## 2024-04-09 MED ORDER — NICOTINE 21 MG/24HR TD PT24
21.0000 mg | MEDICATED_PATCH | Freq: Every day | TRANSDERMAL | Status: DC
Start: 2024-04-10 — End: 2024-04-19
  Administered 2024-04-10 – 2024-04-19 (×10): 21 mg via TRANSDERMAL
  Filled 2024-04-09 (×11): qty 1

## 2024-04-09 MED ORDER — AMLODIPINE BESYLATE 5 MG PO TABS
5.0000 mg | ORAL_TABLET | Freq: Every day | ORAL | Status: DC
  Administered 2024-04-09 – 2024-04-19 (×11): 5 mg via ORAL
  Filled 2024-04-09 (×11): qty 1

## 2024-04-09 MED ORDER — HALOPERIDOL LACTATE 5 MG/ML IJ SOLN: 5.0000 mg | Freq: Three times a day (TID) | INTRAMUSCULAR | Status: DC | PRN

## 2024-04-09 MED ORDER — DIPHENHYDRAMINE HCL 50 MG/ML IJ SOLN: 50.0000 mg | Freq: Three times a day (TID) | INTRAMUSCULAR | Status: DC | PRN

## 2024-04-09 MED ORDER — ACETAMINOPHEN 325 MG PO TABS
650.0000 mg | ORAL_TABLET | Freq: Four times a day (QID) | ORAL | Status: DC | PRN
  Administered 2024-04-10 – 2024-04-18 (×3): 650 mg via ORAL
  Filled 2024-04-09 (×4): qty 2

## 2024-04-09 MED ORDER — MAGNESIUM HYDROXIDE 400 MG/5ML PO SUSP: 30.0000 mL | Freq: Every day | ORAL | Status: DC | PRN

## 2024-04-09 MED ORDER — CIPROFLOXACIN HCL 500 MG PO TABS
500.0000 mg | ORAL_TABLET | Freq: Two times a day (BID) | ORAL | Status: AC
  Administered 2024-04-09 – 2024-04-14 (×10): 500 mg via ORAL
  Filled 2024-04-09 (×11): qty 1

## 2024-04-09 MED ORDER — HALOPERIDOL 5 MG PO TABS
5.0000 mg | ORAL_TABLET | Freq: Three times a day (TID) | ORAL | Status: DC | PRN
  Administered 2024-04-12 – 2024-04-16 (×6): 5 mg via ORAL
  Filled 2024-04-09 (×6): qty 1

## 2024-04-09 MED ORDER — LORAZEPAM 2 MG/ML IJ SOLN: 2.0000 mg | Freq: Three times a day (TID) | INTRAMUSCULAR | Status: DC | PRN

## 2024-04-09 MED ORDER — LORAZEPAM 1 MG PO TABS
1.0000 mg | ORAL_TABLET | ORAL | Status: AC | PRN
  Administered 2024-04-09 – 2024-04-11 (×6): 1 mg via ORAL
  Filled 2024-04-09 (×6): qty 1

## 2024-04-09 MED ORDER — METRONIDAZOLE 500 MG PO TABS
500.0000 mg | ORAL_TABLET | Freq: Two times a day (BID) | ORAL | Status: AC
  Administered 2024-04-10 – 2024-04-14 (×10): 500 mg via ORAL
  Filled 2024-04-09 (×10): qty 1

## 2024-04-09 MED ORDER — DIPHENHYDRAMINE HCL 25 MG PO CAPS
50.0000 mg | ORAL_CAPSULE | Freq: Three times a day (TID) | ORAL | Status: DC | PRN
  Administered 2024-04-12 – 2024-04-16 (×6): 50 mg via ORAL
  Filled 2024-04-09 (×6): qty 2

## 2024-04-09 MED ORDER — TRAZODONE HCL 50 MG PO TABS
50.0000 mg | ORAL_TABLET | Freq: Every evening | ORAL | Status: DC | PRN
  Administered 2024-04-09 – 2024-04-16 (×8): 50 mg via ORAL
  Filled 2024-04-09 (×7): qty 1

## 2024-04-09 MED ORDER — LORAZEPAM 2 MG/ML IJ SOLN: 1.0000 mg | INTRAMUSCULAR | Status: AC | PRN

## 2024-04-09 NOTE — BH Assessment (Signed)
 This Clinical research associate contacted [facility] to request an assessment for this patient, request has been made, assessment currently pending

## 2024-04-09 NOTE — ED Notes (Signed)
 Pt escorted to the interview room for psych consult by this RN and security. Security is standing by outside interview room.

## 2024-04-09 NOTE — ED Notes (Signed)
 Security and counselor to bedside to walk to consult room for assessment

## 2024-04-09 NOTE — ED Notes (Signed)
 Ivc/ psych consult pending
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
## 2024-04-09 NOTE — Progress Notes (Signed)
 Pt was admitted IVC to BMU from [facility] ED.  On assessment pt was calm and cooperative and when asked for reason for admission he stated I couldn't tell you, I remember yesterday I was hanging out at the gazebo in [place], signed myself into [facility] then signed out.    PT endorses drinking 3/fifths of alcohol daily and had consumed quite a bit last night and was told his Ilene was high.  Pt denied any withdrawal symptoms at the time of the assessment.  Pt stated he is recently homeless and umemployed within the last 2 days.  Pt describes strengths as good at reading the room, good at figuring things out, and easy to be social.  He does not have any goals at this time.  Pt was oriented to the unit and placed on q 15 minute checks.  Pt denied SI HI AVH at the time of admission.

## 2024-04-09 NOTE — Progress Notes (Signed)
   04/09/24 2100  Psych Admission Type (Psych Patients Only)  Admission Status Involuntary  Psychosocial Assessment  Patient Complaints Anxiety  Eye Contact Fair  Facial Expression Anxious  Affect Anxious  Speech Soft;Slow  Interaction Assertive  Motor Activity Slow  Appearance/Hygiene Disheveled  Behavior Characteristics Cooperative  Mood Anxious  Thought Process  Coherency Concrete thinking  Content WDL  Delusions None reported or observed  Perception WDL  Hallucination None reported or observed  Judgment Impaired  Confusion None  Danger to Self  Current suicidal ideation? Denies  Danger to Others  Danger to Others None reported or observed

## 2024-04-09 NOTE — ED Notes (Signed)
 Telepsych consult scheduled for 0630.

## 2024-04-09 NOTE — ED Notes (Signed)
 RN asked pt to remove earrings. Pt stated that they are welded together and can not come out. RN noted the backs of them are not your typical earring backing.

## 2024-04-09 NOTE — Consult Note (Signed)
 Attempted to see patient but he was asleep and not answering to attempts to awaken. Will try at a later time.

## 2024-04-09 NOTE — Progress Notes (Signed)
   04/09/24 1800  Psych Admission Type (Psych Patients Only)  Admission Status Involuntary  Psychosocial Assessment  Patient Complaints Substance abuse  Eye Contact Fair  Facial Expression Flat  Affect Flat  Speech Soft;Slow  Interaction Assertive  Motor Activity Slow  Appearance/Hygiene Disheveled  Behavior Characteristics Cooperative  Mood Sad  Thought Administrator, sports thinking  Content Preoccupation  Delusions None reported or observed  Perception WDL  Hallucination None reported or observed  Judgment Impaired  Confusion None  Danger to Self  Current suicidal ideation? Denies  Danger to Others  Danger to Others None reported or observed
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
## 2024-04-09 NOTE — ED Notes (Signed)
 Pt escorted back to his bed by security.

## 2024-04-09 NOTE — Plan of Care (Signed)
   Problem: Education: Goal: Knowledge of [facility] Education information/materials will improve Outcome: Progressing   Problem: Education: Goal: Verbalization of understanding the information provided will improve Outcome: Progressing

## 2024-04-09 NOTE — BH Assessment (Signed)
 Attempted to assess patient but patient difficult to arouse. BAL was 354 upon arrival and UDS is positive for Cannabinoids, Barbiturates, and Benzos. IRIS consult still continues to be pending, no time available as of yet. Psyc to follow.

## 2024-04-09 NOTE — ED Notes (Signed)
 Lunch tray provided to pt.

## 2024-04-09 NOTE — BH Assessment (Signed)
 Patient is to be admitted to [facility] by Psychiatric Nurse Practitioner Daine DEL.  Attending Physician will be Dr. Layne.   Patient has been assigned to room 309, by [facility] Charge Nurse Bretta RAMAN.   Intake Paper Work has been signed and placed on patient chart.  Call report to [phone]   ER staff is aware of the admission: [facility] ER Secretary   Dr. Arlander, ER MD  Delon BRAVO Patient's Nurse  Theoplis NOVAK. Patient Access.

## 2024-04-10 ENCOUNTER — Encounter: Payer: Self-pay | Admitting: Psychiatry

## 2024-04-10 ENCOUNTER — Inpatient Hospital Stay

## 2024-04-10 DIAGNOSIS — R079 Chest pain, unspecified: Secondary | ICD-10-CM | POA: Diagnosis not present

## 2024-04-10 DIAGNOSIS — F332 Major depressive disorder, recurrent severe without psychotic features: Secondary | ICD-10-CM | POA: Diagnosis not present

## 2024-04-10 DIAGNOSIS — F1090 Alcohol use, unspecified, uncomplicated: Secondary | ICD-10-CM

## 2024-04-10 LAB — RESP PANEL BY RT-PCR (RSV, FLU A&B, COVID)  RVPGX2
Influenza A by PCR: NEGATIVE
Influenza B by PCR: NEGATIVE
Resp Syncytial Virus by PCR: NEGATIVE
SARS Coronavirus 2 by RT PCR: NEGATIVE

## 2024-04-10 LAB — TROPONIN I (HIGH SENSITIVITY)
Troponin I (High Sensitivity): 4 ng/L (ref <18)
Troponin I (High Sensitivity): 5 ng/L (ref <18)

## 2024-04-10 LAB — BRAIN NATRIURETIC PEPTIDE: B Natriuretic Peptide: 12.2 pg/mL (ref 0.0–100.0)

## 2024-04-10 MED ORDER — LIDOCAINE 5 % EX PTCH
1.0000 | MEDICATED_PATCH | CUTANEOUS | Status: AC
  Administered 2024-04-10 – 2024-04-13 (×4): 1 via TRANSDERMAL
  Administered 2024-04-14: 2 via TRANSDERMAL
  Filled 2024-04-10 (×4): qty 1
  Filled 2024-04-10: qty 2

## 2024-04-10 MED ORDER — FLUOXETINE HCL 10 MG PO CAPS
10.0000 mg | ORAL_CAPSULE | Freq: Every day | ORAL | Status: DC
  Administered 2024-04-11 – 2024-04-12 (×2): 10 mg via ORAL
  Filled 2024-04-10 (×2): qty 1

## 2024-04-10 MED ORDER — GABAPENTIN 300 MG PO CAPS
600.0000 mg | ORAL_CAPSULE | Freq: Three times a day (TID) | ORAL | Status: DC
  Administered 2024-04-10 – 2024-04-16 (×18): 600 mg via ORAL
  Filled 2024-04-10 (×18): qty 2

## 2024-04-10 MED ORDER — IBUPROFEN 600 MG PO TABS
600.0000 mg | ORAL_TABLET | Freq: Three times a day (TID) | ORAL | Status: AC
Start: 2024-04-10 — End: 2024-04-11
  Administered 2024-04-10 – 2024-04-11 (×3): 600 mg via ORAL
  Filled 2024-04-10 (×3): qty 1

## 2024-04-10 MED ORDER — ARIPIPRAZOLE 5 MG PO TABS
5.0000 mg | ORAL_TABLET | Freq: Every day | ORAL | Status: DC
  Administered 2024-04-10 – 2024-04-15 (×6): 5 mg via ORAL
  Filled 2024-04-10 (×6): qty 1

## 2024-04-10 NOTE — Plan of Care (Signed)
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
## 2024-04-10 NOTE — Group Note (Signed)
 Date:  04/10/2024 Time:  2:02 PM  Group Topic/Focus:  Goals Group:   The focus of this group is to help patients establish daily goals to achieve during treatment and discuss how the patient can incorporate goal setting into their daily lives to aide in recovery.  Participation Level:  Active  Participation Quality:  Appropriate  Affect:  Appropriate  Cognitive:  Appropriate  Insight: Appropriate  Engagement in Group:  Engaged  Modes of Intervention:  Activity, Discussion, and Education  Additional Comments:    Natale Barba A Larya Charpentier 04/10/2024, 2:02 PM

## 2024-04-10 NOTE — Group Note (Signed)
 [facility] LCSW Group Therapy Note   Group Date: 04/10/2024 Start Time: 1300 End Time: 1400   Type of Therapy/Topic:  Group Therapy:  Balance in Life  Participation Level:  Active   Description of Group:    This group will address the concept of balance and how it feels and looks when one is unbalanced. Patients will be encouraged to process areas in their lives that are out of balance, and identify reasons for remaining unbalanced. Facilitators will guide patients utilizing problem- solving interventions to address and correct the stressor making their life unbalanced. Understanding and applying boundaries will be explored and addressed for obtaining  and maintaining a balanced life. Patients will be encouraged to explore ways to assertively make their unbalanced needs known to significant others in their lives, using other group members and facilitator for support and feedback.  Therapeutic Goals: Patient will identify two or more emotions or situations they have that consume much of in their lives. Patient will identify signs/triggers that life has become out of balance:  Patient will identify two ways to set boundaries in order to achieve balance in their lives:  Patient will demonstrate ability to communicate their needs through discussion and/or role plays  Summary of Patient Progress:    During group, patients and facilitator engaged in discussion round "healthy and unhealthy habits." Patient shared their personal afflictions with habits as well as brainstormed ideas for healthier coping skills. During group, patient was open to feedback from peers and provided constructive solutions to fellow group members. Patient was engaged, participated and added to positive group environment.      Therapeutic Modalities:   Cognitive Behavioral Therapy Solution-Focused Therapy Assertiveness Training   Rexene LELON Mae, LCSWA

## 2024-04-10 NOTE — Progress Notes (Signed)
   04/10/24 2000  Psych Admission Type (Psych Patients Only)  Admission Status Involuntary  Psychosocial Assessment  Patient Complaints Anxiety  Eye Contact Fair  Facial Expression Anxious  Affect Anxious  Speech Soft;Slow  Interaction Assertive  Motor Activity Slow  Appearance/Hygiene Disheveled  Behavior Characteristics Cooperative  Mood Anxious  Thought Process  Coherency Concrete thinking  Content WDL  Delusions None reported or observed  Perception WDL  Hallucination None reported or observed  Judgment Impaired  Confusion None  Danger to Self  Current suicidal ideation? Denies  Danger to Others  Danger to Others None reported or observed

## 2024-04-10 NOTE — Progress Notes (Signed)
   04/10/24 1200  Psych Admission Type (Psych Patients Only)  Admission Status Involuntary  Psychosocial Assessment  Patient Complaints Anxiety  Eye Contact Fair  Facial Expression Anxious  Affect Anxious  Speech Soft;Slow  Interaction Assertive  Motor Activity Slow  Appearance/Hygiene Disheveled  Behavior Characteristics Cooperative  Mood Anxious  Thought Process  Coherency Concrete thinking  Content WDL  Delusions None reported or observed  Perception WDL  Hallucination None reported or observed  Judgment Impaired  Confusion None  Danger to Self  Current suicidal ideation? Denies  Danger to Others  Danger to Others None reported or observed

## 2024-04-10 NOTE — Hospital Course (Signed)
 Mr. Christian Dickerson is a 38 year old male with history of alcohol use disorder, alcohol dependence, hypertension, who presents to the ED on 04/08/2024 for chief concern of suicidal ideation and homicidal ideation.  Patient presented via [employer] under involuntary IVC.  [facility] hospitalist was consulted for chief concerns of chest pain and shortness of breath that has been ongoing for the last 3 to 4 days.  His vitals on day of hospitalist consultation showed t 97.1, rr 19, heart rate 78, blood pressure 139/103, SpO2 100% on room air.  Labs on 04/08/2024 showed sodium of 142, potassium 3.7, chloride 107, bicarb 20, BUN of 11, serum creatinine 0.79, eGFR greater than 60, nonfasting blood glucose 97, WBC 8.9, hemoglobin 16, platelets of 199.  EtOH level at that time was 354.  Patient was admitted under behavioral health service on 04/09/2024 for suicidal ideation and alcohol withdrawal.
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
## 2024-04-10 NOTE — Consult Note (Addendum)
 Initial Consultation Note   Patient: Christian Dickerson FMW:[id number] DOB: 06-27-1986 PCP: Pcp, No DOA: 04/09/2024 DOS: the patient was seen and examined on 04/10/2024 Primary service: Donnelly Mellow, MD  Referring physician: Katrinka Salines, advance practice provider Reason for consult: chest pain and shortness of breath  HPI: Mr. Christian Dickerson is a 38 year old male with history of alcohol use disorder, alcohol dependence, hypertension, who presents to the ED on 04/08/2024 for chief concern of suicidal ideation and homicidal ideation.  Patient presented via [employer] under involuntary IVC.  [facility] hospitalist was consulted for chief concerns of chest pain and shortness of breath that has been ongoing for the last 3 to 4 days.  His vitals on day of hospitalist consultation showed t 97.1, rr 19, heart rate 78, blood pressure 139/103, SpO2 100% on room air.  Labs on 04/08/2024 showed sodium of 142, potassium 3.7, chloride 107, bicarb 20, BUN of 11, serum creatinine 0.79, eGFR greater than 60, nonfasting blood glucose 97, WBC 8.9, hemoglobin 16, platelets of 199.  EtOH level at that time was 354.  Patient was admitted under behavioral health service on 04/09/2024 for suicidal ideation and alcohol withdrawal. ------------------------------------------ At bedside, patient was able to tell me his first wife name, age, location, current, the year.  He reports he developed a dull persistent chest discomfort since Monday, 04/04/2024.  He denies known trauma to his person.  He reports the chest discomfort is worse with coughing and laying down.  He reports it is sometimes worse with inhalation.  He reports he developed a cough that is productive of white sputum about 2 to 3 days ago.  He denies known sick contacts.  He denies fever, chills, vomiting, abdominal pain, dysuria, hematuria, diarrhea.  He endorses some nausea and night sweats.  Social history: He smokes about 1 pack of  cigarettes per day, and he drinks about 3 fifth of alcohol a day.  He endorses THC use though he gets it at the gas station.  He denies IV recreational drug use.  ROS: Constitutional: no weight change, no fever ENT/Mouth: no sore throat, no rhinorrhea Eyes: no eye pain, no vision changes Cardiovascular: + chest pain, + dyspnea,  no edema, no palpitations Respiratory: + cough, + sputum, no wheezing Gastrointestinal: + nausea, no vomiting, no diarrhea, no constipation Genitourinary: no urinary incontinence, no dysuria, no hematuria Musculoskeletal: no arthralgias, no myalgias Skin: no skin lesions, no pruritus, Neuro: + weakness, no loss of consciousness, no syncope Psych: no anxiety, no depression, + decrease appetite Heme/Lymph: no bruising, no bleeding  Assessment/Plan  Principal Problem:   Alcohol use disorder Active Problems:   Chest pain   Assessment and Plan:  Chest pain Etiology workup in progress Check portable chest x-ray, BNP, high sensitive troponin Chest pain is worse with inspiration Pericarditis cannot be excluded at this time, complete echo ordered, EKG, check covid/influenza a/influenza b/rsv by pcr Lidocaine  1-2 patch daily in the chest area Recheck BMP in a.m.  Pericarditis treatment initiated: Ibuprofen  600 mg 3 times daily, 3 doses started on consult.  AM team to review echocardiogram and BMP especially renal function.  If echocardiogram is positive for pericarditis, would recommend continuation of ibuprofen  p.o. 3 times daily as appropriate.  If echocardiogram is negative, would recommend no continuation of ibuprofen .  Discussed with primary team  Thank you for allowing us  the opportunity to participate in the care of Mr. Christian Dickerson.  [facility] team will continue to follow up  Chart reviewed.  DVT prophylaxis: Per primary team Code Status: Full code Diet: Per primary team Disposition Plan: Pending primary team Admission status: Behavioral  health  Past Medical History:  Diagnosis Date   Alcohol abuse    Diverticulitis    Past Surgical History:  Procedure Laterality Date   ANKLE SURGERY     CHEST TUBE INSERTION     LITHOTRIPSY     TIBIA FRACTURE SURGERY     TONSILLECTOMY AND ADENOIDECTOMY     Social History:  reports that he has been smoking cigarettes. He started smoking about 13 months ago. He has a 1.1 pack-year smoking history. He has been exposed to tobacco smoke. He has never used smokeless tobacco. He reports current alcohol use of about 35.0 standard drinks of alcohol per week. He reports current drug use. Drug: Marijuana.  Allergies  Allergen Reactions   Iodine    Latex    History reviewed. No pertinent family history. Family history: Family history reviewed and not pertinent.  Prior to Admission medications   Medication Sig Start Date End Date Taking? Authorizing Provider  acetaminophen  (TYLENOL ) 325 MG tablet Take 2 tablets (650 mg total) by mouth every 6 (six) hours as needed for mild pain (pain score 1-3) (or Fever >/= 100.4). 04/03/24   Jens Durand, MD  amLODipine  (NORVASC ) 5 MG tablet Take 1 tablet (5 mg total) by mouth daily. 04/03/24 04/03/25  Jens Durand, MD  ciprofloxacin  (CIPRO ) 500 MG tablet Take 1 tablet (500 mg total) by mouth 2 (two) times daily for 10 days. 04/03/24 04/13/24  Jens Durand, MD  metroNIDAZOLE  (FLAGYL ) 500 MG tablet Take 1 tablet (500 mg total) by mouth 2 (two) times daily for 10 days. 04/03/24 04/13/24  Jens Durand, MD   Physical Exam: Vitals:   04/09/24 1300 04/10/24 0641 04/10/24 0900 04/10/24 1617  BP: (!) 139/96 (!) 156/109 (!) 139/103 (!) 148/110  Pulse: (!) 107 81 78 89  Resp: 16 19    Temp: (!) 97.3 F (36.3 C) (!) 97.1 F (36.2 C)  97.7 F (36.5 C)  TempSrc: Temporal     SpO2: 98% 100%  99%  Weight: 108 kg     Height: 6' 1 (1.854 m)      Constitutional: appears older than chronological age, NAD, calm Eyes: PERRL, lids and conjunctivae normal ENMT:  Mucous membranes are moist. Posterior pharynx clear of any exudate or lesions. Age-appropriate dentition. Hearing appropriate Neck: normal, supple, no masses, no thyromegaly Respiratory: clear to auscultation bilaterally, no wheezing, no crackles. Normal respiratory effort. No accessory muscle use.  Cardiovascular: Regular rate and rhythm, no murmurs / rubs / gallops. No extremity edema. 2+ pedal pulses. No carotid bruits.  Abdomen: Obese abdomen, no tenderness, no masses palpated, no hepatosplenomegaly. Bowel sounds positive.  Musculoskeletal: no clubbing / cyanosis. No joint deformity upper and lower extremities. Good ROM, no contractures, no atrophy. Normal muscle tone.  Skin: no rashes, lesions, ulcers. No induration Neurologic: Sensation intact. Strength 5/5 in all 4.  Psychiatric: Normal judgment and insight. Alert and oriented x 3. Normal mood.   EKG: independently reviewed, showing sinus rhythm with rate of 91, QTc 455 essentially unchanged from EKG yesterday showing sinus rhythm with rate of 95, QTc 454  Chest x-ray on Admission: I personally reviewed and I agree with radiologist reading as below.  DG Chest Port 1 View Result Date: 04/10/2024 CLINICAL DATA:  355200 Chest pain [id number] 10026 Shortness of breath 10026 EXAM: PORTABLE CHEST - 1 VIEW COMPARISON:  August 18, 2018 FINDINGS: No  focal airspace consolidation, pleural effusion, or pneumothorax. No cardiomegaly.No acute fracture or destructive lesion. IMPRESSION: No acute cardiopulmonary abnormality. Electronically Signed   By: Rogelia Myers M.D.   On: 04/10/2024 13:32   Labs on Admission: I have personally reviewed following labs  CBC: Recent Labs  Lab 04/06/24 0916 04/08/24 2215  WBC 7.3 8.9  NEUTROABS 2.3  --   HGB 15.2 16.0  HCT 44.6 46.3  MCV 102.3* 101.3*  PLT 174 199   Basic Metabolic Panel: Recent Labs  Lab 04/06/24 0916 04/08/24 2215  NA 144 142  K 3.9 3.7  CL 106 107  CO2 25 20*  GLUCOSE 114* 97  BUN  8 11  CREATININE 0.64 0.79  CALCIUM 9.5 9.0   GFR: Estimated Creatinine Clearance: 161.3 mL/min (by C-G formula based on SCr of 0.79 mg/dL).  Liver Function Tests: Recent Labs  Lab 04/06/24 0916 04/08/24 2215  AST 50* 53*  ALT 47* 45*  ALKPHOS 90 111  BILITOT 0.6 0.7  PROT 7.7 8.7*  ALBUMIN 3.9 4.4   Urine analysis:    Component Value Date/Time   COLORURINE AMBER (A) 04/01/2024 0150   APPEARANCEUR CLEAR (A) 04/01/2024 0150   APPEARANCEUR Clear 07/11/2014 1250   LABSPEC >1.046 (H) 04/01/2024 0150   LABSPEC 1.021 07/11/2014 1250   PHURINE 5.0 04/01/2024 0150   GLUCOSEU NEGATIVE 04/01/2024 0150   GLUCOSEU Negative 07/11/2014 1250   HGBUR NEGATIVE 04/01/2024 0150   BILIRUBINUR NEGATIVE 04/01/2024 0150   BILIRUBINUR Negative 07/11/2014 1250   KETONESUR NEGATIVE 04/01/2024 0150   PROTEINUR NEGATIVE 04/01/2024 0150   NITRITE NEGATIVE 04/01/2024 0150   LEUKOCYTESUR NEGATIVE 04/01/2024 0150   LEUKOCYTESUR Negative 07/11/2014 1250   This document was prepared using Dragon Voice Recognition software and may include unintentional dictation errors.  Dr. Sherre [facility]  If 7PM-7AM, please contact overnight-coverage provider If 7AM-7PM, please contact day attending provider [email or web address]  04/10/2024, 4:18 PM

## 2024-04-10 NOTE — Plan of Care (Signed)
  Problem: Education: Goal: Mental status will improve Outcome: Progressing   Problem: Education: Goal: Emotional status will improve Outcome: Progressing   Problem: Education: Goal: Knowledge of [facility] Education information/materials will improve Outcome: Progressing

## 2024-04-10 NOTE — BH Assessment (Signed)
 CSW provided patient with EBT application as well as residential program resources to assess.   CSW will touch base with patient of his choices fr residential programs to start the referral process.   Once patient completes EBT application, CSW will fax off to his county DSS on his behalf.   CSW to continue to assess.   Carson Bogden, MSW, LCSWA 04/10/2024 4:31 PM

## 2024-04-10 NOTE — Assessment & Plan Note (Addendum)
 Etiology workup in progress Check portable chest x-ray, BNP, high sensitive troponin Chest pain is worse with inspiration Pericarditis cannot be excluded at this time, complete echo ordered, EKG, check covid/influenza a/influenza b/rsv by pcr Lidocaine  1-2 patch daily in the chest area Recheck BMP in a.m.  Pericarditis treatment initiated: Ibuprofen  600 mg 3 times daily, 3 doses started on consult.  AM team to review echocardiogram and BMP especially renal function.  If echocardiogram is positive for pericarditis, would recommend continuation of ibuprofen  p.o. 3 times daily as appropriate.  If echocardiogram is negative, would recommend no continuation of ibuprofen .  Discussed with primary team
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
## 2024-04-10 NOTE — Group Note (Signed)
 Date:  04/10/2024 Time:  10:07 PM  Group Topic/Focus:  Wrap-Up Group:   The focus of this group is to help patients review their daily goal of treatment and discuss progress on daily workbooks.    Participation Level:  Active  Participation Quality:  Appropriate and Attentive  Affect:  Appropriate  Cognitive:  Appropriate  Insight: Appropriate and Good  Engagement in Group:  Engaged  Modes of Intervention:  Confrontation  Additional Comments:     Arlester CHRISTELLA Servant 04/10/2024, 10:07 PM

## 2024-04-11 ENCOUNTER — Inpatient Hospital Stay

## 2024-04-11 ENCOUNTER — Inpatient Hospital Stay (HOSPITAL_COMMUNITY)
Admit: 2024-04-11 | Discharge: 2024-04-11 | Disposition: A | Discharge status: Home / Self Care | Attending: Internal Medicine | Admitting: Internal Medicine

## 2024-04-11 DIAGNOSIS — R079 Chest pain, unspecified: Secondary | ICD-10-CM | POA: Diagnosis not present

## 2024-04-11 DIAGNOSIS — F1090 Alcohol use, unspecified, uncomplicated: Secondary | ICD-10-CM | POA: Diagnosis not present

## 2024-04-11 DIAGNOSIS — R0789 Other chest pain: Secondary | ICD-10-CM

## 2024-04-11 LAB — ECHOCARDIOGRAM COMPLETE
AR max vel: 3.08 cm2
AV Area VTI: 3.65 cm2
AV Area mean vel: 3.37 cm2
AV Mean grad: 3.5 mmHg
AV Peak grad: 7 mmHg
Ao pk vel: 1.33 m/s
Area-P 1/2: 3.83 cm2
Height: 73 in
MV VTI: 4.2 cm2
S' Lateral: 3 cm
Weight: 3808 [oz_av]

## 2024-04-11 LAB — BASIC METABOLIC PANEL WITH GFR
Anion gap: 12 (ref 5–15)
BUN: 12 mg/dL (ref 6–20)
CO2: 23 mmol/L (ref 22–32)
Calcium: 9.4 mg/dL (ref 8.9–10.3)
Chloride: 104 mmol/L (ref 98–111)
Creatinine, Ser: 0.79 mg/dL (ref 0.61–1.24)
GFR, Estimated: >60 mL/min (ref >60)
Glucose, Bld: 99 mg/dL (ref 70–99)
Potassium: 4.3 mmol/L (ref 3.5–5.1)
Sodium: 139 mmol/L (ref 135–145)

## 2024-04-11 LAB — D-DIMER, QUANTITATIVE: D-Dimer, Quant: 0.55 ug{FEU}/mL — ABNORMAL HIGH (ref 0.00–0.50)

## 2024-04-11 MED ORDER — IOHEXOL 350 MG/ML SOLN
75.0000 mL | Freq: Once | INTRAVENOUS | Status: AC | PRN
  Administered 2024-04-11: 75 mL via INTRAVENOUS

## 2024-04-11 MED ORDER — ADULT MULTIVITAMIN W/MINERALS CH
1.0000 | ORAL_TABLET | Freq: Every day | ORAL | Status: DC
  Administered 2024-04-11 – 2024-04-19 (×9): 1 via ORAL
  Filled 2024-04-11 (×9): qty 1

## 2024-04-11 MED ORDER — ENSURE PLUS HIGH PROTEIN PO LIQD
237.0000 mL | Freq: Two times a day (BID) | ORAL | Status: DC
  Administered 2024-04-12 – 2024-04-18 (×6): 237 mL via ORAL

## 2024-04-11 MED ORDER — FOLIC ACID 1 MG PO TABS
1.0000 mg | ORAL_TABLET | Freq: Every day | ORAL | Status: DC
  Administered 2024-04-11 – 2024-04-19 (×9): 1 mg via ORAL
  Filled 2024-04-11 (×9): qty 1

## 2024-04-11 MED ORDER — THIAMINE MONONITRATE 100 MG PO TABS
100.0000 mg | ORAL_TABLET | Freq: Every day | ORAL | Status: AC
  Administered 2024-04-11 – 2024-04-15 (×5): 100 mg via ORAL
  Filled 2024-04-11 (×5): qty 1

## 2024-04-11 NOTE — Progress Notes (Signed)
   04/11/24 0854  Psych Admission Type (Psych Patients Only)  Admission Status Involuntary  Psychosocial Assessment  Patient Complaints Other (Comment) (withdrawal symptoms and chest pain)  Eye Contact Fair  Facial Expression Flat  Affect Flat  Speech Soft  Interaction Assertive  Motor Activity Slow  Appearance/Hygiene In scrubs  Behavior Characteristics Cooperative  Mood Pleasant  Aggressive Behavior  Effect No apparent injury  Thought Process  Coherency WDL  Content WDL  Delusions None reported or observed  Perception WDL  Hallucination None reported or observed  Judgment Impaired  Confusion WDL  Danger to Self  Current suicidal ideation? Denies  Danger to Others  Danger to Others None reported or observed

## 2024-04-11 NOTE — Plan of Care (Signed)
  Problem: Education: Goal: Emotional status will improve Outcome: Progressing   Problem: Education: Goal: Knowledge of [facility] Education information/materials will improve Outcome: Progressing   Problem: Education: Goal: Mental status will improve Outcome: Progressing   Problem: Education: Goal: Verbalization of understanding the information provided will improve Outcome: Progressing

## 2024-04-11 NOTE — Group Note (Signed)
 [facility] LCSW Group Therapy Note   Group Date: 04/11/2024 Start Time: 1300 End Time: 1400   Type of Therapy/Topic:  Group Therapy:  Emotion Regulation  Participation Level:  Active   Mood:  Description of Group:    The purpose of this group is to assist patients in learning to regulate negative emotions and experience positive emotions. Patients will be guided to discuss ways in which they have been vulnerable to their negative emotions. These vulnerabilities will be juxtaposed with experiences of positive emotions or situations, and patients challenged to use positive emotions to combat negative ones. Special emphasis will be placed on coping with negative emotions in conflict situations, and patients will process healthy conflict resolution skills.  Therapeutic Goals: Patient will identify two positive emotions or experiences to reflect on in order to balance out negative emotions:  Patient will label two or more emotions that they find the most difficult to experience:  Patient will be able to demonstrate positive conflict resolution skills through discussion or role plays:   Summary of Patient Progress:   During group, patient and group explored the ways in which our thoughts can impact our feelings which impacts our behaviors. Group along with facilitator completed a thermometer activity where different areas of life were explored. Participants were asked to notate in which zone these areas exist in on their personal thermometers. The group then discussed coping skills, and safety plans to help better prepare for potential stressors and learn to better emotionally regulate.     Therapeutic Modalities:   Cognitive Behavioral Therapy Feelings Identification Dialectical Behavioral Therapy   Alveta CHRISTELLA Kerns, LCSW

## 2024-04-11 NOTE — Progress Notes (Signed)
 Pt calm and pleasant during assessment denying SI/HI/AVH. Pt observed by this Clinical research associate interacting appropriately with staff and peers on the unit. Pt complaint with medication administration per MD orders. Pt given education, support, and encouragement to be active in his treatment plan. Pt being monitored Q 15 minutes for safety per unit protocol, remains safe on the unit

## 2024-04-11 NOTE — Progress Notes (Signed)
 NUTRITION ASSESSMENT  Pt identified as at risk on the Malnutrition Screen Tool  INTERVENTION:  -Liberalize diet to regular for widest variety of meal selections -MVI with minerals daily -100 mg thiamine  daily -1 mg folic acid  daily -Ensure Plus High Protein po BID, each supplement provides 350 kcal and 20 grams of protein   NUTRITION DIAGNOSIS: Unintentional weight loss related to sub-optimal intake as evidenced by pt report.   Goal: Pt to meet >/= 90% of their estimated nutrition needs.  Monitor:  PO intake  Assessment:  Pt admitted under IVC after pt called reporting was going to hang self from bridge, that he was going to make police shoot him  told LEO that he would kill first person he saw when d/c from ED, also stated was going to stab self. Noted patient was in ED on 10/1 for Alcohol detox, wanted to go to [facility] but left due to wanting to smoke a cigarette.  Patient was recently discharged from hospital on 04/03/24 for colonic diverticular abscess and is still undergoing antibiotic therapy.   Pt admitted with alcohol use disorder.   Per H&P, pt has been drinking heavily over the past 8-9 years. He currently drinks 3 fifths of alcohol daily.   Pt currently on a heart healthy diet. No meal completion data available to assess at this time.   Reviewed wt hx; pt has experienced a 4.5% wt loss over the past month, which is not significant for time frame.   Medications reviewed and include neurontin and cipro .   Labs reviewed. Tox screen positive for barbituates, benzodiazepine, and cannabinoids.   38 y.o. male  Height: Ht Readings from Last 1 Encounters:  04/09/24 6' 1 (1.854 m)    Weight: Wt Readings from Last 1 Encounters:  04/09/24 108 kg    Weight Hx: Wt Readings from Last 10 Encounters:  04/09/24 108 kg  04/08/24 122.5 kg  04/06/24 122.5 kg  03/31/24 111.1 kg  03/16/24 113.1 kg  11/19/19 108 kg  04/29/19 110.2 kg  01/18/19 111.1 kg  08/18/18 108.9 kg   06/04/17 111.6 kg    BMI:  Body mass index is 31.4 kg/m. Pt meets criteria for obesity, class I based on current BMI. Obesity is a complex, chronic medical condition that is optimally managed by a multidisciplinary care team. Weight loss is not an ideal goal for an acute inpatient hospitalization. However, if further work-up for obesity is warranted, consider outpatient referral to [facility].    Estimated Nutritional Needs: Kcal: 25-30 kcal/kg Protein: > 1 gram protein/kg Fluid: 1 ml/kcal  Diet Order:  Diet Order             Diet Heart Room service appropriate? Yes; Fluid consistency: Thin  Diet effective now                  Pt is also offered choice of unit snacks mid-morning and mid-afternoon.  Pt is eating as desired.   Lab results and medications reviewed.   Margery ORN, RD, LDN, CDCES Registered Dietitian III Certified Diabetes Care and Education Specialist If unable to reach this RD, please use RD Inpatient group chat on secure chat between hours of 8am-4 pm daily

## 2024-04-11 NOTE — Group Note (Signed)
 Recreation Therapy Group Note   Group Topic:General Recreation  Group Date: 04/11/2024 Start Time: 1045 End Time: 1130 Facilitators: Celestia Jeoffrey BRAVO, LRT, CTRS Location: Courtyard  Group Description: [employer]. LRT and patients played games of basketball, drew with chalk, and played corn hole while outside in the courtyard while getting fresh air and sunlight. Music was being played in the background. LRT and peers conversed about different games they have played before, what they do in their free time and anything else that is on their minds. LRT encouraged pts to drink water after being outside, sweating and getting their heart rate up.  Goal Area(s) Addressed: Patient will build on frustration tolerance skills. Patients will partake in a competitive play game with peers. Patients will gain knowledge of new leisure interest/hobby.    Affect/Mood: Appropriate   Participation Level: Active   Participation Quality: Independent   Behavior: Appropriate   Speech/Thought Process: Coherent   Insight: Good   Judgement: Good   Modes of Intervention: Activity   Patient Response to Interventions:  Receptive   Education Outcome:  Acknowledges education   Clinical Observations/Individualized Feedback: Christian Dickerson was active in their participation of session activities and group discussion. Pt interacted well with LRT and peers duration of session.    Plan: Continue to engage patient in RT group sessions 2-3x/week.   Jeoffrey BRAVO Celestia, LRT, CTRS 04/11/2024 11:47 AM

## 2024-04-11 NOTE — BH IP Treatment Plan (Signed)
 Interdisciplinary Treatment and Diagnostic Plan Update  04/11/2024 Time of Session: 10:35 Christian Dickerson MRN: [id number]  Principal Diagnosis: Alcohol use disorder  Secondary Diagnoses: Principal Problem:   Alcohol use disorder Active Problems:   Chest pain   Major depressive disorder, recurrent severe without psychotic features (HCC)   Current Medications:  Current Facility-Administered Medications  Medication Dose Route Frequency Provider Last Rate Last Admin   acetaminophen  (TYLENOL ) tablet 650 mg  650 mg Oral Q6H PRN Hampton, Tracie B, NP   650 mg at 04/10/24 0806   alum & mag hydroxide-simeth (MAALOX/MYLANTA) 200-200-20 MG/5ML suspension 30 mL  30 mL Oral Q4H PRN Hampton, Tracie B, NP       amLODipine  (NORVASC ) tablet 5 mg  5 mg Oral Daily Hampton, Tracie B, NP   5 mg at 04/11/24 0827   ARIPiprazole (ABILIFY) tablet 5 mg  5 mg Oral QHS Hunter, Crystal L, PA-C   5 mg at 04/10/24 2131   ciprofloxacin  (CIPRO ) tablet 500 mg  500 mg Oral BID Hampton, Tracie B, NP   500 mg at 04/11/24 0827   haloperidol (HALDOL) tablet 5 mg  5 mg Oral TID PRN Hampton, Tracie B, NP       And   diphenhydrAMINE  (BENADRYL ) capsule 50 mg  50 mg Oral TID PRN Hampton, Tracie B, NP       haloperidol lactate (HALDOL) injection 5 mg  5 mg Intramuscular TID PRN Hampton, Tracie B, NP       And   diphenhydrAMINE  (BENADRYL ) injection 50 mg  50 mg Intramuscular TID PRN Hampton, Tracie B, NP       And   LORazepam  (ATIVAN ) injection 2 mg  2 mg Intramuscular TID PRN Hampton, Tracie B, NP       feeding supplement (ENSURE PLUS HIGH PROTEIN) liquid 237 mL  237 mL Oral BID BM Jadapalle, Sree, MD       FLUoxetine (PROZAC) capsule 10 mg  10 mg Oral Daily Hunter, Crystal L, PA-C   10 mg at 04/11/24 9173   folic acid  (FOLVITE ) tablet 1 mg  1 mg Oral Daily Jadapalle, Sree, MD   1 mg at 04/11/24 0826   gabapentin (NEURONTIN) capsule 600 mg  600 mg Oral TID Hunter, Crystal L, PA-C   600 mg at 04/11/24 1211   lidocaine   (LIDODERM ) 5 % 1-2 patch  1-2 patch Transdermal Q24H Cox, Amy N, DO   1 patch at 04/11/24 1434   LORazepam  (ATIVAN ) tablet 1-4 mg  1-4 mg Oral Q1H PRN Hampton, Tracie B, NP   1 mg at 04/11/24 1438   Or   LORazepam  (ATIVAN ) injection 1-4 mg  1-4 mg Intravenous Q1H PRN Hampton, Tracie B, NP       magnesium hydroxide (MILK OF MAGNESIA) suspension 30 mL  30 mL Oral Daily PRN Hampton, Tracie B, NP       metroNIDAZOLE  (FLAGYL ) tablet 500 mg  500 mg Oral BID Hampton, Tracie B, NP   500 mg at 04/11/24 0827   multivitamin with minerals tablet 1 tablet  1 tablet Oral Daily Jadapalle, Sree, MD   1 tablet at 04/11/24 9173   nicotine  (NICODERM CQ  - dosed in mg/24 hours) patch 21 mg  21 mg Transdermal Daily Hampton, Tracie B, NP   21 mg at 04/11/24 0827   thiamine  (VITAMIN B1) tablet 100 mg  100 mg Oral Daily Jadapalle, Sree, MD   100 mg at 04/11/24 0827   traZODone (DESYREL) tablet 50 mg  50 mg  Oral QHS PRN Hampton, Tracie B, NP   50 mg at 04/10/24 2140   PTA Medications: Medications Prior to Admission  Medication Sig Dispense Refill Last Dose/Taking   acetaminophen  (TYLENOL ) 325 MG tablet Take 2 tablets (650 mg total) by mouth every 6 (six) hours as needed for mild pain (pain score 1-3) (or Fever >/= 100.4).   Unknown   amLODipine  (NORVASC ) 5 MG tablet Take 1 tablet (5 mg total) by mouth daily. 30 tablet 0 Unknown   ciprofloxacin  (CIPRO ) 500 MG tablet Take 1 tablet (500 mg total) by mouth 2 (two) times daily for 10 days. 20 tablet 0 Unknown   metroNIDAZOLE  (FLAGYL ) 500 MG tablet Take 1 tablet (500 mg total) by mouth 2 (two) times daily for 10 days. 20 tablet 0 Unknown    Patient Stressors:    Patient Strengths:    Treatment Modalities: Medication Management, Group therapy, Case management,  1 to 1 session with clinician, Psychoeducation, Recreational therapy.   Physician Treatment Plan for Primary Diagnosis: Alcohol use disorder Long Term Goal(s): Improvement in symptoms so as ready for discharge    Short Term Goals: Ability to identify changes in lifestyle to reduce recurrence of condition will improve Ability to verbalize feelings will improve Ability to demonstrate self-control will improve Ability to identify and develop effective coping behaviors will improve  Medication Management: Evaluate patient's response, side effects, and tolerance of medication regimen.  Therapeutic Interventions: 1 to 1 sessions, Unit Group sessions and Medication administration.  Evaluation of Outcomes: Progressing  Physician Treatment Plan for Secondary Diagnosis: Principal Problem:   Alcohol use disorder Active Problems:   Chest pain   Major depressive disorder, recurrent severe without psychotic features (HCC)  Long Term Goal(s): Improvement in symptoms so as ready for discharge   Short Term Goals: Ability to identify changes in lifestyle to reduce recurrence of condition will improve Ability to verbalize feelings will improve Ability to demonstrate self-control will improve Ability to identify and develop effective coping behaviors will improve     Medication Management: Evaluate patient's response, side effects, and tolerance of medication regimen.  Therapeutic Interventions: 1 to 1 sessions, Unit Group sessions and Medication administration.  Evaluation of Outcomes: Progressing   RN Treatment Plan for Primary Diagnosis: Alcohol use disorder Long Term Goal(s): Knowledge of disease and therapeutic regimen to maintain health will improve  Short Term Goals: Ability to remain free from injury will improve, Ability to verbalize frustration and anger appropriately will improve, Ability to demonstrate self-control, Ability to participate in decision making will improve, Ability to verbalize feelings will improve, Ability to disclose and discuss suicidal ideas, Ability to identify and develop effective coping behaviors will improve, and Compliance with prescribed medications will  improve  Medication Management: RN will administer medications as ordered by provider, will assess and evaluate patient's response and provide education to patient for prescribed medication. RN will report any adverse and/or side effects to prescribing provider.  Therapeutic Interventions: 1 on 1 counseling sessions, Psychoeducation, Medication administration, Evaluate responses to treatment, Monitor vital signs and CBGs as ordered, Perform/monitor CIWA, COWS, AIMS and Fall Risk screenings as ordered, Perform wound care treatments as ordered.  Evaluation of Outcomes: Progressing   LCSW Treatment Plan for Primary Diagnosis: Alcohol use disorder Long Term Goal(s): Safe transition to appropriate next level of care at discharge, Engage patient in therapeutic group addressing interpersonal concerns.  Short Term Goals: Engage patient in aftercare planning with referrals and resources, Increase social support, Increase ability to appropriately verbalize feelings, Increase  emotional regulation, Facilitate acceptance of mental health diagnosis and concerns, Facilitate patient progression through stages of change regarding substance use diagnoses and concerns, Identify triggers associated with mental health/substance abuse issues, and Increase skills for wellness and recovery  Therapeutic Interventions: Assess for all discharge needs, 1 to 1 time with Social worker, Explore available resources and support systems, Assess for adequacy in community support network, Educate family and significant other(s) on suicide prevention, Complete Psychosocial Assessment, Interpersonal group therapy.  Evaluation of Outcomes: Progressing   Progress in Treatment: Attending groups: Yes. Participating in groups: Yes. Taking medication as prescribed: Yes. Toleration medication: Yes. Family/Significant other contact made: No, will contact:  brother. Patient understands diagnosis: Yes. Discussing patient identified  problems/goals with staff: Yes. Medical problems stabilized or resolved: Yes. Denies suicidal/homicidal ideation: Yes. Issues/concerns per patient self-inventory: No. Other: none.  New problem(s) identified: No, Describe:  none identified.   New Short Term/Long Term Goal(s):  detox; elimination of symptoms of psychosis, medication management for mood stabilization; elimination of SI thoughts; development of comprehensive mental wellness/sobriety plan.  Patient Goals:  I'm just trying to not drink anymore.  Discharge Plan or Barriers: CSW will assist pt with development of an appropriate aftercare/discharge plan.   Reason for Continuation of Hospitalization: Medication stabilization Suicidal ideation Withdrawal symptoms  Estimated Length of Stay: 1-7 days  Last 3 [place] Suicide Severity Risk Score: Flowsheet Row Admission (Current) from [facility]/09/2024 in [facility] ED from 04/08/2024 in [facility] Emergency Department at [facility] ED from 04/06/2024 in [facility] Department at [facility]  C-SSRS RISK CATEGORY No Risk High Risk No Risk    Last PHQ 2/9 Scores:     No data to display          Scribe for Treatment Team: Nadara JONELLE Fam, LCSW 04/11/2024 3:29 PM

## 2024-04-11 NOTE — Group Note (Signed)
 Date:  04/11/2024 Time:  9:07 PM  Group Topic/Focus:  Self Care:   The focus of this group is to help patients understand the importance of self-care in order to improve or restore emotional, physical, spiritual, interpersonal, and financial health.    Participation Level:  Active  Participation Quality:  Appropriate  Affect:  Appropriate  Cognitive:  Appropriate  Insight: Appropriate  Engagement in Group:  Engaged  Modes of Intervention:  Discussion  Additional Comments:    [name] 04/11/2024, 9:07 PM

## 2024-04-11 NOTE — Progress Notes (Cosign Needed Addendum)
 [facility] MD Progress Note  04/11/2024 9:50 PM Christian Dickerson  MRN:  [id number]   Subjective:  Chart reviewed, case discussed in multidisciplinary meeting, patient seen during rounds.   On interview today, patient is found seated in his room.  He is calm and cooperative and engaged in interview.  He reports depression as a 0 out of 10, and anxiety as a 10 out of 10, stating his upcoming court date and attempts to get in touch with a public defender has contributed to his anxiety.  He reports tolerating current medication regimen well without adverse effects.  He denies SI/HI/plan and denies hallucinations.  He reports night sweat and nausea; on CIWA protocol. Patient seen by dietician for nutritional assessment and management.  Patient was evaluated by hospitalist for chest pain, determined to be muscular in origin, no treatment needed, hospitalist has signed off.    Sleep: Good  Appetite:  Good  Past Psychiatric History: see h&P Family History: History reviewed. No pertinent family history. Social History:  Social History   Substance and Sexual Activity  Alcohol Use Yes   Alcohol/week: 35.0 standard drinks of alcohol   Types: 35 Shots of liquor per week   Comment: 2 fifths a day     Social History   Substance and Sexual Activity  Drug Use Yes   Types: Marijuana    Social History   Socioeconomic History   Marital status: Single    Spouse name: Not on file   Number of children: Not on file   Years of education: Not on file   Highest education level: Not on file  Occupational History   Not on file  Tobacco Use   Smoking status: Every Day    Current packs/day: 1.00    Average packs/day: 1 pack/day for 1.1 years (1.1 ttl pk-yrs)    Types: Cigarettes    Start date: 03/08/2023    Passive exposure: Current   Smokeless tobacco: Never  Substance and Sexual Activity   Alcohol use: Yes    Alcohol/week: 35.0 standard drinks of alcohol    Types: 35 Shots of liquor per week    Comment:  2 fifths a day   Drug use: Yes    Types: Marijuana   Sexual activity: Not Currently  Other Topics Concern   Not on file  Social History Narrative   Not on file   Social Drivers of Health   Financial Resource Strain: Not on file  Food Insecurity: No Food Insecurity (04/09/2024)   Hunger Vital Sign    Worried About Running Out of Food in the Last Year: Never true    Ran Out of Food in the Last Year: Never true  Recent Concern: Food Insecurity - Food Insecurity Present (03/31/2024)   Hunger Vital Sign    Worried About Running Out of Food in the Last Year: Sometimes true    Ran Out of Food in the Last Year: Never true  Transportation Needs: Unmet Transportation Needs (04/09/2024)   PRAPARE - Administrator, Civil Service (Medical): Yes    Lack of Transportation (Non-Medical): Yes  Physical Activity: Not on file  Stress: Not on file  Social Connections: Socially Isolated (03/31/2024)   Social Connection and Isolation Panel    Frequency of Communication with Friends and Family: Once a week    Frequency of Social Gatherings with Friends and Family: Never    Attends Religious Services: Never    Database administrator or Organizations: No  Attends Banker Meetings: Never    Marital Status: Never married   Past Medical History:  Past Medical History:  Diagnosis Date   Alcohol abuse    Diverticulitis     Past Surgical History:  Procedure Laterality Date   ANKLE SURGERY     CHEST TUBE INSERTION     LITHOTRIPSY     TIBIA FRACTURE SURGERY     TONSILLECTOMY AND ADENOIDECTOMY      Current Medications: Current Facility-Administered Medications  Medication Dose Route Frequency Provider Last Rate Last Admin   acetaminophen  (TYLENOL ) tablet 650 mg  650 mg Oral Q6H PRN Hampton, Tracie B, NP   650 mg at 04/10/24 0806   alum & mag hydroxide-simeth (MAALOX/MYLANTA) 200-200-20 MG/5ML suspension 30 mL  30 mL Oral Q4H PRN Hampton, Tracie B, NP       amLODipine   (NORVASC ) tablet 5 mg  5 mg Oral Daily Hampton, Tracie B, NP   5 mg at 04/11/24 0827   ARIPiprazole (ABILIFY) tablet 5 mg  5 mg Oral QHS Michalene Debruler L, PA-C   5 mg at 04/11/24 2103   ciprofloxacin  (CIPRO ) tablet 500 mg  500 mg Oral BID Hampton, Tracie B, NP   500 mg at 04/11/24 2104   haloperidol (HALDOL) tablet 5 mg  5 mg Oral TID PRN Hampton, Tracie B, NP       And   diphenhydrAMINE  (BENADRYL ) capsule 50 mg  50 mg Oral TID PRN Hampton, Tracie B, NP       haloperidol lactate (HALDOL) injection 5 mg  5 mg Intramuscular TID PRN Hampton, Tracie B, NP       And   diphenhydrAMINE  (BENADRYL ) injection 50 mg  50 mg Intramuscular TID PRN Hampton, Tracie B, NP       And   LORazepam  (ATIVAN ) injection 2 mg  2 mg Intramuscular TID PRN Hampton, Tracie B, NP       feeding supplement (ENSURE PLUS HIGH PROTEIN) liquid 237 mL  237 mL Oral BID BM Jadapalle, Sree, MD       FLUoxetine (PROZAC) capsule 10 mg  10 mg Oral Daily Amier Hoyt L, PA-C   10 mg at 04/11/24 9173   folic acid  (FOLVITE ) tablet 1 mg  1 mg Oral Daily Jadapalle, Sree, MD   1 mg at 04/11/24 9173   gabapentin (NEURONTIN) capsule 600 mg  600 mg Oral TID Jacqulyne Gladue L, PA-C   600 mg at 04/11/24 1624   lidocaine  (LIDODERM ) 5 % 1-2 patch  1-2 patch Transdermal Q24H Cox, Amy N, DO   1 patch at 04/11/24 1434   LORazepam  (ATIVAN ) tablet 1-4 mg  1-4 mg Oral Q1H PRN Hampton, Tracie B, NP   1 mg at 04/11/24 2103   Or   LORazepam  (ATIVAN ) injection 1-4 mg  1-4 mg Intravenous Q1H PRN Hampton, Tracie B, NP       magnesium hydroxide (MILK OF MAGNESIA) suspension 30 mL  30 mL Oral Daily PRN Hampton, Tracie B, NP       metroNIDAZOLE  (FLAGYL ) tablet 500 mg  500 mg Oral BID Hampton, Tracie B, NP   500 mg at 04/11/24 1625   multivitamin with minerals tablet 1 tablet  1 tablet Oral Daily Jadapalle, Sree, MD   1 tablet at 04/11/24 9173   nicotine  (NICODERM CQ  - dosed in mg/24 hours) patch 21 mg  21 mg Transdermal Daily Hampton, Tracie B, NP   21 mg at  04/11/24 0827   thiamine  (VITAMIN B1) tablet  100 mg  100 mg Oral Daily Jadapalle, Sree, MD   100 mg at 04/11/24 0827   traZODone (DESYREL) tablet 50 mg  50 mg Oral QHS PRN Hampton, Tracie B, NP   50 mg at 04/11/24 2103    Lab Results:  Results for orders placed or performed during the hospital encounter of 04/09/24 (from the past 48 hours)  Troponin I (High Sensitivity)     Status: None   Collection Time: 04/10/24  1:00 PM  Result Value Ref Range   Troponin I (High Sensitivity) 4 <18 ng/L    Comment: (NOTE) Elevated high sensitivity troponin I (hsTnI) values and significant  changes across serial measurements may suggest ACS but many other  chronic and acute conditions are known to elevate hsTnI results.  Refer to the Links section for chest pain algorithms and additional  guidance. Performed at [facility], [address] [place], [place] [address]   Brain natriuretic peptide     Status: None   Collection Time: 04/10/24  1:00 PM  Result Value Ref Range   B Natriuretic Peptide 12.2 0.0 - 100.0 pg/mL    Comment: Performed at [facility], [address] [place], [place] [address]  Troponin I (High Sensitivity)     Status: None   Collection Time: 04/10/24  3:28 PM  Result Value Ref Range   Troponin I (High Sensitivity) 5 <18 ng/L    Comment: (NOTE) Elevated high sensitivity troponin I (hsTnI) values and significant  changes across serial measurements may suggest ACS but many other  chronic and acute conditions are known to elevate hsTnI results.  Refer to the Links section for chest pain algorithms and additional  guidance. Performed at [facility], [address] [place], [place] [address]   Resp panel by RT-PCR (RSV, Flu A&B, Covid) Anterior Nasal Swab     Status: None   Collection Time: 04/10/24  7:10 PM   Specimen: Anterior Nasal Swab  Result Value Ref Range   SARS Coronavirus 2 by RT PCR NEGATIVE NEGATIVE    Comment:  (NOTE) SARS-CoV-2 target nucleic acids are NOT DETECTED.  The SARS-CoV-2 RNA is generally detectable in upper respiratory specimens during the acute phase of infection. The lowest concentration of SARS-CoV-2 viral copies this assay can detect is 138 copies/mL. A negative result does not preclude SARS-Cov-2 infection and should not be used as the sole basis for treatment or other patient management decisions. A negative result may occur with  improper specimen collection/handling, submission of specimen other than nasopharyngeal swab, presence of viral mutation(s) within the areas targeted by this assay, and inadequate number of viral copies(<138 copies/mL). A negative result must be combined with clinical observations, patient history, and epidemiological information. The expected result is Negative.  Fact Sheet for Patients:  BloggerCourse.com  Fact Sheet for Healthcare Providers:  SeriousBroker.it  This test is no t yet approved or cleared by the [place]  FDA and  has been authorized for detection and/or diagnosis of SARS-CoV-2 by FDA under an Emergency Use Authorization (EUA). This EUA will remain  in effect (meaning this test can be used) for the duration of the COVID-19 declaration under Section 564(b)(1) of the Act, 21 U.S.C.section 360bbb-3(b)(1), unless the authorization is terminated  or revoked sooner.       Influenza A by PCR NEGATIVE NEGATIVE   Influenza B by PCR NEGATIVE NEGATIVE    Comment: (NOTE) The Xpert Xpress SARS-CoV-2/FLU/RSV plus assay is intended as an aid in the diagnosis  of influenza from Nasopharyngeal swab specimens and should not be used as a sole basis for treatment. Nasal washings and aspirates are unacceptable for Xpert Xpress SARS-CoV-2/FLU/RSV testing.  Fact Sheet for Patients: BloggerCourse.com  Fact Sheet for Healthcare  Providers: SeriousBroker.it  This test is not yet approved or cleared by the [place]  FDA and has been authorized for detection and/or diagnosis of SARS-CoV-2 by FDA under an Emergency Use Authorization (EUA). This EUA will remain in effect (meaning this test can be used) for the duration of the COVID-19 declaration under Section 564(b)(1) of the Act, 21 U.S.C. section 360bbb-3(b)(1), unless the authorization is terminated or revoked.     Resp Syncytial Virus by PCR NEGATIVE NEGATIVE    Comment: (NOTE) Fact Sheet for Patients: BloggerCourse.com  Fact Sheet for Healthcare Providers: SeriousBroker.it  This test is not yet approved or cleared by the [place]  FDA and has been authorized for detection and/or diagnosis of SARS-CoV-2 by FDA under an Emergency Use Authorization (EUA). This EUA will remain in effect (meaning this test can be used) for the duration of the COVID-19 declaration under Section 564(b)(1) of the Act, 21 U.S.C. section 360bbb-3(b)(1), unless the authorization is terminated or revoked.  Performed at [facility], [address] [place], [place] [address]   Basic metabolic panel     Status: None   Collection Time: 04/11/24  8:41 AM  Result Value Ref Range   Sodium 139 135 - 145 mmol/L   Potassium 4.3 3.5 - 5.1 mmol/L    Comment: HEMOLYSIS AT THIS LEVEL MAY AFFECT RESULT   Chloride 104 98 - 111 mmol/L   CO2 23 22 - 32 mmol/L   Glucose, Bld 99 70 - 99 mg/dL    Comment: Glucose reference range applies only to samples taken after fasting for at least 8 hours.   BUN 12 6 - 20 mg/dL   Creatinine, Ser 9.20 0.61 - 1.24 mg/dL   Calcium 9.4 8.9 - 89.6 mg/dL   GFR, Estimated >39 >39 mL/min    Comment: (NOTE) Calculated using the CKD-EPI Creatinine Equation (2021)    Anion gap 12 5 - 15    Comment: Performed at [facility], [address] [place], [place]  [address]  D-dimer, quantitative     Status: Abnormal   Collection Time: 04/11/24 11:40 AM  Result Value Ref Range   D-Dimer, Quant 0.55 (H) 0.00 - 0.50 ug/mL-FEU    Comment: (NOTE) At the manufacturer cut-off value of 0.5 g/mL FEU, this assay has a negative predictive value of 95-100%.This assay is intended for use in conjunction with a clinical pretest probability (PTP) assessment model to exclude pulmonary embolism (PE) and deep venous thrombosis (DVT) in outpatients suspected of PE or DVT. Results should be correlated with clinical presentation. Performed at [facility], [address] [place], [place] [address]     Blood Alcohol level:  Lab Results  Component Value Date   ETH 354 [facility]) 04/08/2024   ETH 309 ([facility]) 04/06/2024    Metabolic Disorder Labs: No results found for: HGBA1C, MPG No results found for: PROLACTIN No results found for: CHOL, TRIG, HDL, CHOLHDL, VLDL, LDLCALC  Physical Findings: AIMS:  , ,  ,  ,    CIWA:  CIWA-Ar Total: 0 COWS:      Psychiatric Specialty Exam:  Presentation  General Appearance:  Appropriate for Environment  Eye Contact: Good  Speech: Clear and Coherent  Speech Volume: Normal    Mood and Affect  Mood:  Depressed  Affect: Congruent   Thought Process  Thought Processes: Linear  Descriptions of Associations:Intact  Orientation:Full (Time, Place and Person)  Thought Content:WDL  Hallucinations:Hallucinations: None  Ideas of Reference:None  Suicidal Thoughts:No  Homicidal Thoughts:No    Sensorium  Memory: Immediate Fair; Recent Fair; Remote Fair  Judgment: Poor  Insight: Fair   Chartered certified accountant: Fair  Attention Span: Fair  Recall: [employer] of Knowledge: Fair  Language: Fair   Psychomotor Activity  Psychomotor Activity: Psychomotor Activity: Normal  Musculoskeletal: Strength & Muscle Tone: within normal limits Gait & Station:  normal Assets  Assets: Manufacturing systems engineer; Desire for Improvement    Physical Exam: Physical Exam ROS Blood pressure (!) 140/105, pulse 83, temperature (!) 97.2 F (36.2 C), resp. rate 18, height 6' 1 (1.854 m), weight 108 kg, SpO2 99%. Body mass index is 31.4 kg/m.  Diagnosis: Principal Problem:   Alcohol use disorder Active Problems:   Chest pain   Major depressive disorder, recurrent severe without psychotic features (HCC)   PLAN: Safety and Monitoring:  -- Voluntary admission to inpatient psychiatric unit for safety, stabilization and treatment  -- Daily contact with patient to assess and evaluate symptoms and progress in treatment  -- Patient's case to be discussed in multi-disciplinary team meeting  -- Observation Level : q15 minute checks  -- Vital signs:  q12 hours  -- Precautions: suicide, elopement, and assault -- Encouraged patient to participate in unit milieu and in scheduled group therapies  2. Psychiatric Diagnoses and Treatment:   MDD, recurrent, severe, rule out bipolar disorder             Alcohol use disorder             Anxiety disorder Continue gabapentin 600 mg 3 times daily Continue Abilify 5 mg nightly Continue Prozac 10 mg daily Patient wishes to avoid Zoloft due to previous abuse of this medication CIWA protocol -- The risks/benefits/side-effects/alternatives to this medication were discussed in detail with the patient and time was given for questions. The patient consents to medication trial.                -- Metabolic profile and EKG monitoring obtained while on an atypical antipsychotic (BMI: Lipid Panel: HbgA1c: QTc:)              -- Encouraged patient to participate in unit milieu and in scheduled group therapies    Hospital Course:     3. Medical Issues Being Addressed:  Patient was evaluated by hospitalist for chest pain, determined to be muscular in origin, no treatment needed, hospitalist has signed off.    4. Discharge  Planning:   -- Social work and case management to assist with discharge planning and identification of hospital follow-up needs prior to discharge  -- Estimated LOS: 5-7 days  Camelia LITTIE Lukes, PA-C 04/11/2024, 9:50 PM

## 2024-04-11 NOTE — Plan of Care (Signed)

## 2024-04-11 NOTE — Progress Notes (Addendum)
  Progress Note   Patient: Christian Dickerson FMW:[id number] DOB: 10/07/1985 DOA: 04/09/2024     2 DOS: the patient was seen and examined on 04/11/2024   Brief hospital course: Mr. Jamespaul Secrist is a 38 year old male with history of alcohol use disorder, alcohol dependence, hypertension, who presents to the ED on 04/08/2024 for chief concern of suicidal ideation and homicidal ideation.  Patient presented via [employer] under involuntary IVC.  [facility] hospitalist was consulted for chief concerns of chest pain and shortness of breath that has been ongoing for the last 3 to 4 days.  His vitals on day of hospitalist consultation showed t 97.1, rr 19, heart rate 78, blood pressure 139/103, SpO2 100% on room air.  Labs on 04/08/2024 showed sodium of 142, potassium 3.7, chloride 107, bicarb 20, BUN of 11, serum creatinine 0.79, eGFR greater than 60, nonfasting blood glucose 97, WBC 8.9, hemoglobin 16, platelets of 199.  EtOH level at that time was 354.  Patient was admitted under behavioral health service on 04/09/2024 for suicidal ideation and alcohol withdrawal.   Principal Problem:   Alcohol use disorder Active Problems:   Chest pain   Major depressive disorder, recurrent severe without psychotic features (HCC)   Assessment and Plan:  Chest pain. Patient describes the pain localized to the left lower chest, worse with coughing or deep breath.  Echocardiogram did not show any evidence of pericarditis.  Troponin negative. Patient had a mild elevation of D-dimer, will obtain a CT angiogram to rule out PE.  If this is negative, patient probably has chest pain from muscular origin. 1656. CT angiogram did not show major PE. Chest pain most likely muscular in origin. No treatment needed. Will sign off.   Diverticulitis with abscess, status post IR drain. Patient still oral antibiotics.  Class I obesity. Diet and exercise.  Alcohol dependence. Treated by  psychiatry     Subjective: \ Patient no longer has any chest pain.  No shortness of breath.  Physical Exam: Vitals:   04/10/24 0900 04/10/24 1617 04/11/24 0629 04/11/24 0827  BP: (!) 139/103 (!) 148/110 119/84 119/84  Pulse: 78 89 72   Resp:   18   Temp:  97.7 F (36.5 C) (!) 97.2 F (36.2 C)   TempSrc:      SpO2:  99% 99%   Weight:      Height:       General exam: Appears calm and comfortable  Respiratory system: Clear to auscultation. Respiratory effort normal. Cardiovascular system: S1 & S2 heard, RRR. No JVD, murmurs, rubs, gallops or clicks. No pedal edema. Gastrointestinal system: Abdomen is nondistended, soft and nontender. No organomegaly or masses felt. Normal bowel sounds heard. Central nervous system: Alert and oriented. No focal neurological deficits. Extremities: Symmetric 5 x 5 power. Skin: No rashes, lesions or ulcers Psychiatry: Judgement and insight appear normal. Mood & affect appropriate.    Data Reviewed:  Reviewed CT scan results and lab results.  Family Communication: None  Disposition:      Time spent: 35 minutes  Author: Murvin Mana, MD 04/11/2024 12:55 PM  For on call review [email or web address].

## 2024-04-11 NOTE — Group Note (Signed)
 Recreation Therapy Group Note   Group Topic:Other  Group Date: 04/11/2024 Start Time: 1550 End Time: 1635 Facilitators: Celestia Jeoffrey FORBES ARTICE, CTRS Location: Craft Room  Activity Description/Intervention: Therapeutic Drumming. Patients with peers and staff were given the opportunity to engage in a leader facilitated [facility]RHYTH[facility] Group Empowerment Drumming Circle with staff from the [employer], in partnership with [employer]. Teaching laboratory technician and trained [employer], Norleen Mon leading with LRT observing and documenting intervention and pt response. This evidenced-based practice targets 7 areas of health and wellbeing in the human experience including: stress-reduction, exercise, self-expression, camaraderie/support, nurturing, spirituality, and music-making (leisure).    Goal Area(s) Addresses:  Patient will engage in pro-social way in music group.  Patient will follow directions of drum leader on the first prompt. Patient will demonstrate no behavioral issues during group.  Patient will identify if a reduction in stress level occurs as a result of participation in therapeutic drum circle.      Affect/Mood: N/A   Participation Level: Did not attend    Clinical Observations/Individualized Feedback: Patient did not attend group.   Plan: Continue to engage patient in RT group sessions 2-3x/week.   Jeoffrey FORBES Celestia, LRT, CTRS 04/11/2024 4:45 PM

## 2024-04-12 ENCOUNTER — Ambulatory Visit: Admitting: Surgery

## 2024-04-12 DIAGNOSIS — F1090 Alcohol use, unspecified, uncomplicated: Secondary | ICD-10-CM | POA: Diagnosis not present

## 2024-04-12 DIAGNOSIS — R079 Chest pain, unspecified: Secondary | ICD-10-CM | POA: Diagnosis not present

## 2024-04-12 DIAGNOSIS — F332 Major depressive disorder, recurrent severe without psychotic features: Secondary | ICD-10-CM | POA: Diagnosis not present

## 2024-04-12 MED ORDER — FLUOXETINE HCL 20 MG PO CAPS
20.0000 mg | ORAL_CAPSULE | Freq: Every day | ORAL | Status: DC
  Administered 2024-04-13 – 2024-04-16 (×4): 20 mg via ORAL
  Filled 2024-04-12 (×4): qty 1

## 2024-04-12 NOTE — Group Note (Signed)
 Recreation Therapy Group Note   Group Topic:Health and Wellness  Group Date: 04/12/2024 Start Time: 1000 End Time: 1100 Facilitators: Celestia Jeoffrey BRAVO, LRT, CTRS Location: Courtyard  Group Description: [employer]. LRT and patients played games of basketball, drew with chalk, and played corn hole while outside in the courtyard while getting fresh air and sunlight. Music was being played in the background. LRT and peers conversed about different games they have played before, what they do in their free time and anything else that is on their minds. LRT encouraged pts to drink water after being outside, sweating and getting their heart rate up.  Goal Area(s) Addressed: Patient will build on frustration tolerance skills. Patients will partake in a competitive play game with peers. Patients will gain knowledge of new leisure interest/hobby.    Affect/Mood: Appropriate   Participation Level: Active and Engaged   Participation Quality: Independent   Behavior: Calm and Cooperative   Speech/Thought Process: Coherent   Insight: Good   Judgement: Good   Modes of Intervention: Music, Rapport Building, and Socialization   Patient Response to Interventions:  Attentive, Engaged, Interested , and Receptive   Education Outcome:  Acknowledges education   Clinical Observations/Individualized Feedback: Christian Dickerson was active in their participation of session activities and group discussion. Pt interacted well with LRT and peers duration of session.    Plan: Continue to engage patient in RT group sessions 2-3x/week.   Jeoffrey BRAVO Celestia, LRT, CTRS 04/12/2024 11:23 AM

## 2024-04-12 NOTE — Group Note (Signed)
 [facility] LCSW Group Therapy Note   Group Date: 04/12/2024 Start Time: 1310 End Time: 1400  Type of Therapy/Topic:  Group Therapy:  Feelings about Diagnosis  Participation Level:  None   Description of Group:    This group will allow patients to explore their thoughts and feelings about diagnoses they have received. Patients will be guided to explore their level of understanding and acceptance of these diagnoses. Facilitator will encourage patients to process their thoughts and feelings about the reactions of others to their diagnosis, and will guide patients in identifying ways to discuss their diagnosis with significant others in their lives. This group will be process-oriented, with patients participating in exploration of their own experiences as well as giving and receiving support and challenge from other group members.   Therapeutic Goals: 1. Patient will demonstrate understanding of diagnosis as evidence by identifying two or more symptoms of the disorder:  2. Patient will be able to express two feelings regarding the diagnosis 3. Patient will demonstrate ability to communicate their needs through discussion and/or role plays  Summary of Patient Progress: Patient was present in group.  Patient appeared attentive however, did not engage in discussion.   Therapeutic Modalities:   Cognitive Behavioral Therapy Brief Therapy Feelings Identification    Sherryle JINNY Margo, LCSW

## 2024-04-12 NOTE — Plan of Care (Signed)
   Problem: Education: Goal: Emotional status will improve Outcome: Progressing Goal: Mental status will improve Outcome: Progressing Goal: Verbalization of understanding the information provided will improve Outcome: Progressing

## 2024-04-12 NOTE — Plan of Care (Signed)
   Problem: Education: Goal: Emotional status will improve Outcome: Progressing Goal: Mental status will improve Outcome: Progressing

## 2024-04-12 NOTE — Group Note (Signed)
 Date:  04/12/2024 Time:  4:21 PM  Group Topic/Focus:  Goals Group:   The focus of this group is to help patients establish daily goals to achieve during treatment and discuss how the patient can incorporate goal setting into their daily lives to aide in recovery.    Participation Level:  Active  Participation Quality:  Appropriate  Affect:  Appropriate  Cognitive:  Appropriate  Insight: Appropriate  Engagement in Group:  Engaged  Modes of Intervention:  Discussion, Education, and Support  Additional Comments:    Deitra Caron Mainland 04/12/2024, 4:21 PM

## 2024-04-12 NOTE — Plan of Care (Signed)

## 2024-04-12 NOTE — [facility] Suicide Risk Assessment (Signed)
 [facility] INPATIENT:  Family/Significant Other Suicide Prevention Education  Suicide Prevention Education:  Contact Attempts: Jeffrrey Vogul, [phone], [place], (name of family member/significant other) has been identified by the patient as the family member/significant other with whom the patient will be residing, and identified as the person(s) who will aid the patient in the event of a mental health crisis.  With written consent from the patient, two attempts were made to provide suicide prevention education, prior to and/or following the patient's discharge.  We were unsuccessful in providing suicide prevention education.  A suicide education pamphlet was given to the patient to share with family/significant other.  Date and time of first attempt: 04/12/2024 at 3:57PM Date and time of second attempt: Second attempt is needed.  CSW unable to speak with brother or leave HIPAA compliant voicemail.  Number was not in service.   Sherryle JINNY Margo 04/12/2024, 3:56 PM

## 2024-04-12 NOTE — Progress Notes (Signed)
   04/12/24 1955  Psych Admission Type (Psych Patients Only)  Admission Status Involuntary  Psychosocial Assessment  Patient Complaints None  Eye Contact Fair  Facial Expression Animated  Affect Flat  Speech Rapid  Interaction Assertive  Motor Activity Other (Comment) (wnl)  Appearance/Hygiene Unremarkable  Behavior Characteristics Appropriate to situation  Mood Preoccupied  Thought Process  Coherency WDL  Content WDL  Delusions None reported or observed  Perception WDL  Hallucination None reported or observed  Judgment WDL  Confusion WDL  Danger to Self  Current suicidal ideation? Denies  Description of Agreement verbal  Danger to Others  Danger to Others None reported or observed    Patient continues to complain of anxiety, agitation and shakiness. Prn given

## 2024-04-12 NOTE — Group Note (Signed)
 Recreation Therapy Group Note   Group Topic:Emotion Expression  Group Date: 04/12/2024 Start Time: 1530 End Time: 1630 Facilitators: Celestia Jeoffrey FORBES ARTICE, CTRS Location: Craft Room  Group Description: Painting a Diplomatic Services operational officer. Patients and LRT discuss what it means to be "at peace", what it feels like physically and mentally. Pts are given a canvas and watercolor paint to use and encouraged to draw their idea of a peaceful place. Pts and LRT discuss how they use this in their daily life post discharge. Pts are encouraged to take their canvas home with them as a reminder to find their peaceful place whenever they are feeling depressed, anxious, etc.    Goal Area(s) Addressed:  Patient will identify what it means to experience a "peaceful" emotion. Patient will identify a new coping skill.  Patient will express their emotions through art. Patients will increase communication by talking with LRT and peers while in group.   Affect/Mood: Appropriate   Participation Level: Active and Engaged   Participation Quality: Independent   Behavior: Appropriate, Calm, and Cooperative   Speech/Thought Process: Coherent   Insight: Good   Judgement: Good   Modes of Intervention: Art   Patient Response to Interventions:  Attentive, Engaged, Interested , and Receptive   Education Outcome:  Acknowledges education   Clinical Observations/Individualized Feedback: Jefte was active in their participation of session activities and group discussion. Pt identified Chaos. Being in a big town, closing your eyes and listening to all the noise as his peaceful place.    Plan: Continue to engage patient in RT group sessions 2-3x/week.   Jeoffrey FORBES Celestia, LRT, CTRS 04/12/2024 5:17 PM

## 2024-04-12 NOTE — Progress Notes (Signed)
 [facility] MD Progress Note  04/12/2024 3:39 PM LEONIDAS BOATENG  MRN:  [id number]   Subjective:  Chart reviewed, case discussed in multidisciplinary meeting, patient seen during rounds.   10/7: On interview today, patient is found interacting in the milieu.  He is calm and cooperative, alert and oriented.  He reports persistent symptoms of depression anxiety, though does note some improvement, which he attributes to psychotropic medications recently started, Abilify and Prozac.  He is tolerating current medication regimen well without adverse effects.  He is agreeable to Prozac dose increased at this time.  He noted some night sweats and tremors today, tremors have since resolved.  He denies SI/HI/plan and denies hallucinations.  He reports chest pain, which was determined to be muscular in origin by hospitalist, is controlled with lidocaine  patch and Tylenol .  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  He is counseled by provider that compression stocking will not be allowed on unit at this time due to potential ligature risk.  Patient verbalizes understanding.  10/6: On interview today, patient is found seated in his room.  He is calm and cooperative and engaged in interview.  He reports depression as a 0 out of 10, and anxiety as a 10 out of 10, stating his upcoming court date and attempts to get in touch with a public defender has contributed to his anxiety.  He reports tolerating current medication regimen well without adverse effects.  He denies SI/HI/plan and denies hallucinations.  He reports night sweat and nausea; on CIWA protocol. Patient seen by dietician for nutritional assessment and management.  Patient was evaluated by hospitalist for chest pain, determined to be muscular in origin, no treatment needed, hospitalist has signed off.    Sleep: Good  Appetite:  Good  Past Psychiatric History: see h&P Family History: History reviewed. No  pertinent family history. Social History:  Social History   Substance and Sexual Activity  Alcohol Use Yes   Alcohol/week: 35.0 standard drinks of alcohol   Types: 35 Shots of liquor per week   Comment: 2 fifths a day     Social History   Substance and Sexual Activity  Drug Use Yes   Types: Marijuana    Social History   Socioeconomic History   Marital status: Single    Spouse name: Not on file   Number of children: Not on file   Years of education: Not on file   Highest education level: Not on file  Occupational History   Not on file  Tobacco Use   Smoking status: Every Day    Current packs/day: 1.00    Average packs/day: 1 pack/day for 1.1 years (1.1 ttl pk-yrs)    Types: Cigarettes    Start date: 03/08/2023    Passive exposure: Current   Smokeless tobacco: Never  Substance and Sexual Activity   Alcohol use: Yes    Alcohol/week: 35.0 standard drinks of alcohol    Types: 35 Shots of liquor per week    Comment: 2 fifths a day   Drug use: Yes    Types: Marijuana   Sexual activity: Not Currently  Other Topics Concern   Not on file  Social History Narrative   Not on file   Social Drivers of Health   Financial Resource Strain: Not on file  Food Insecurity: No Food Insecurity (04/09/2024)   Hunger Vital Sign    Worried About Running Out of Food in the Last  Year: Never true    Ran Out of Food in the Last Year: Never true  Recent Concern: Food Insecurity - Food Insecurity Present (03/31/2024)   Hunger Vital Sign    Worried About Running Out of Food in the Last Year: Sometimes true    Ran Out of Food in the Last Year: Never true  Transportation Needs: Unmet Transportation Needs (04/09/2024)   PRAPARE - Administrator, Civil Service (Medical): Yes    Lack of Transportation (Non-Medical): Yes  Physical Activity: Not on file  Stress: Not on file  Social Connections: Socially Isolated (03/31/2024)   Social Connection and Isolation Panel    Frequency of  Communication with Friends and Family: Once a week    Frequency of Social Gatherings with Friends and Family: Never    Attends Religious Services: Never    Diplomatic Services operational officer: No    Attends Engineer, structural: Never    Marital Status: Never married   Past Medical History:  Past Medical History:  Diagnosis Date   Alcohol abuse    Diverticulitis     Past Surgical History:  Procedure Laterality Date   ANKLE SURGERY     CHEST TUBE INSERTION     LITHOTRIPSY     TIBIA FRACTURE SURGERY     TONSILLECTOMY AND ADENOIDECTOMY      Current Medications: Current Facility-Administered Medications  Medication Dose Route Frequency Provider Last Rate Last Admin   acetaminophen  (TYLENOL ) tablet 650 mg  650 mg Oral Q6H PRN Hampton, Tracie B, NP   650 mg at 04/12/24 0815   alum & mag hydroxide-simeth (MAALOX/MYLANTA) 200-200-20 MG/5ML suspension 30 mL  30 mL Oral Q4H PRN Hampton, Tracie B, NP       amLODipine  (NORVASC ) tablet 5 mg  5 mg Oral Daily Hampton, Tracie B, NP   5 mg at 04/12/24 0814   ARIPiprazole (ABILIFY) tablet 5 mg  5 mg Oral QHS Pocahontas Cohenour L, PA-C   5 mg at 04/11/24 2103   ciprofloxacin  (CIPRO ) tablet 500 mg  500 mg Oral BID Hampton, Tracie B, NP   500 mg at 04/12/24 0814   haloperidol (HALDOL) tablet 5 mg  5 mg Oral TID PRN Hampton, Tracie B, NP   5 mg at 04/12/24 1025   And   diphenhydrAMINE  (BENADRYL ) capsule 50 mg  50 mg Oral TID PRN Hampton, Tracie B, NP   50 mg at 04/12/24 1025   haloperidol lactate (HALDOL) injection 5 mg  5 mg Intramuscular TID PRN Hampton, Tracie B, NP       And   diphenhydrAMINE  (BENADRYL ) injection 50 mg  50 mg Intramuscular TID PRN Hampton, Tracie B, NP       And   LORazepam  (ATIVAN ) injection 2 mg  2 mg Intramuscular TID PRN Hampton, Tracie B, NP       feeding supplement (ENSURE PLUS HIGH PROTEIN) liquid 237 mL  237 mL Oral BID BM Jadapalle, Sree, MD   237 mL at 04/12/24 1332   [START ON 04/13/2024] FLUoxetine (PROZAC)  capsule 20 mg  20 mg Oral Daily Tacie Mccuistion L, PA-C       folic acid  (FOLVITE ) tablet 1 mg  1 mg Oral Daily Jadapalle, Sree, MD   1 mg at 04/12/24 0814   gabapentin (NEURONTIN) capsule 600 mg  600 mg Oral TID Shandon Burlingame L, PA-C   600 mg at 04/12/24 1247   lidocaine  (LIDODERM ) 5 % 1-2 patch  1-2 patch Transdermal  Q24H Cox, Amy N, DO   1 patch at 04/12/24 1248   magnesium hydroxide (MILK OF MAGNESIA) suspension 30 mL  30 mL Oral Daily PRN Hampton, Tracie B, NP       metroNIDAZOLE  (FLAGYL ) tablet 500 mg  500 mg Oral BID Hampton, Tracie B, NP   500 mg at 04/12/24 0815   multivitamin with minerals tablet 1 tablet  1 tablet Oral Daily Jadapalle, Sree, MD   1 tablet at 04/12/24 9185   nicotine  (NICODERM CQ  - dosed in mg/24 hours) patch 21 mg  21 mg Transdermal Daily Hampton, Tracie B, NP   21 mg at 04/12/24 0813   thiamine  (VITAMIN B1) tablet 100 mg  100 mg Oral Daily Jadapalle, Sree, MD   100 mg at 04/12/24 0814   traZODone (DESYREL) tablet 50 mg  50 mg Oral QHS PRN Hampton, Tracie B, NP   50 mg at 04/11/24 2103    Lab Results:  Results for orders placed or performed during the hospital encounter of 04/09/24 (from the past 48 hours)  Resp panel by RT-PCR (RSV, Flu A&B, Covid) Anterior Nasal Swab     Status: None   Collection Time: 04/10/24  7:10 PM   Specimen: Anterior Nasal Swab  Result Value Ref Range   SARS Coronavirus 2 by RT PCR NEGATIVE NEGATIVE    Comment: (NOTE) SARS-CoV-2 target nucleic acids are NOT DETECTED.  The SARS-CoV-2 RNA is generally detectable in upper respiratory specimens during the acute phase of infection. The lowest concentration of SARS-CoV-2 viral copies this assay can detect is 138 copies/mL. A negative result does not preclude SARS-Cov-2 infection and should not be used as the sole basis for treatment or other patient management decisions. A negative result may occur with  improper specimen collection/handling, submission of specimen other than  nasopharyngeal swab, presence of viral mutation(s) within the areas targeted by this assay, and inadequate number of viral copies(<138 copies/mL). A negative result must be combined with clinical observations, patient history, and epidemiological information. The expected result is Negative.  Fact Sheet for Patients:  BloggerCourse.com  Fact Sheet for Healthcare Providers:  SeriousBroker.it  This test is no t yet approved or cleared by the [place]  FDA and  has been authorized for detection and/or diagnosis of SARS-CoV-2 by FDA under an Emergency Use Authorization (EUA). This EUA will remain  in effect (meaning this test can be used) for the duration of the COVID-19 declaration under Section 564(b)(1) of the Act, 21 U.S.C.section 360bbb-3(b)(1), unless the authorization is terminated  or revoked sooner.       Influenza A by PCR NEGATIVE NEGATIVE   Influenza B by PCR NEGATIVE NEGATIVE    Comment: (NOTE) The Xpert Xpress SARS-CoV-2/FLU/RSV plus assay is intended as an aid in the diagnosis of influenza from Nasopharyngeal swab specimens and should not be used as a sole basis for treatment. Nasal washings and aspirates are unacceptable for Xpert Xpress SARS-CoV-2/FLU/RSV testing.  Fact Sheet for Patients: BloggerCourse.com  Fact Sheet for Healthcare Providers: SeriousBroker.it  This test is not yet approved or cleared by the [place]  FDA and has been authorized for detection and/or diagnosis of SARS-CoV-2 by FDA under an Emergency Use Authorization (EUA). This EUA will remain in effect (meaning this test can be used) for the duration of the COVID-19 declaration under Section 564(b)(1) of the Act, 21 U.S.C. section 360bbb-3(b)(1), unless the authorization is terminated or revoked.     Resp Syncytial Virus by PCR NEGATIVE NEGATIVE    Comment: (  NOTE) Fact Sheet for  Patients: BloggerCourse.com  Fact Sheet for Healthcare Providers: SeriousBroker.it  This test is not yet approved or cleared by the [place]  FDA and has been authorized for detection and/or diagnosis of SARS-CoV-2 by FDA under an Emergency Use Authorization (EUA). This EUA will remain in effect (meaning this test can be used) for the duration of the COVID-19 declaration under Section 564(b)(1) of the Act, 21 U.S.C. section 360bbb-3(b)(1), unless the authorization is terminated or revoked.  Performed at [facility], [address] [place], [place] [address]   Basic metabolic panel     Status: None   Collection Time: 04/11/24  8:41 AM  Result Value Ref Range   Sodium 139 135 - 145 mmol/L   Potassium 4.3 3.5 - 5.1 mmol/L    Comment: HEMOLYSIS AT THIS LEVEL MAY AFFECT RESULT   Chloride 104 98 - 111 mmol/L   CO2 23 22 - 32 mmol/L   Glucose, Bld 99 70 - 99 mg/dL    Comment: Glucose reference range applies only to samples taken after fasting for at least 8 hours.   BUN 12 6 - 20 mg/dL   Creatinine, Ser 9.20 0.61 - 1.24 mg/dL   Calcium 9.4 8.9 - 89.6 mg/dL   GFR, Estimated >39 >39 mL/min    Comment: (NOTE) Calculated using the CKD-EPI Creatinine Equation (2021)    Anion gap 12 5 - 15    Comment: Performed at [facility], [address] [place], [place] [address]  D-dimer, quantitative     Status: Abnormal   Collection Time: 04/11/24 11:40 AM  Result Value Ref Range   D-Dimer, Quant 0.55 (H) 0.00 - 0.50 ug/mL-FEU    Comment: (NOTE) At the manufacturer cut-off value of 0.5 g/mL FEU, this assay has a negative predictive value of 95-100%.This assay is intended for use in conjunction with a clinical pretest probability (PTP) assessment model to exclude pulmonary embolism (PE) and deep venous thrombosis (DVT) in outpatients suspected of PE or DVT. Results should be correlated with clinical  presentation. Performed at [facility], [address] [place], [place] [address]     Blood Alcohol level:  Lab Results  Component Value Date   ETH [facility]) 04/08/2024   ETH 309 (HH) 04/06/2024    Metabolic Disorder Labs: No results found for: HGBA1C, MPG No results found for: PROLACTIN No results found for: CHOL, TRIG, HDL, CHOLHDL, VLDL, LDLCALC  Physical Findings: AIMS:  , ,  ,  ,    CIWA:  CIWA-Ar Total: 3 COWS:      Psychiatric Specialty Exam:  Presentation  General Appearance:  Appropriate for Environment  Eye Contact: Good  Speech: Clear and Coherent  Speech Volume: Normal    Mood and Affect  Mood: Depressed  Affect: Congruent   Thought Process  Thought Processes: Linear  Descriptions of Associations:Intact  Orientation:Full (Time, Place and Person)  Thought Content:WDL  Hallucinations: None  Ideas of Reference:None  Suicidal Thoughts: No  Homicidal Thoughts: No    Sensorium  Memory: Immediate Fair; Recent Fair; Remote Fair  Judgment: Fair  Insight: Fair   Art therapist  Concentration: Fair  Attention Span: Fair  Recall: [employer] of Knowledge: Fair  Language: Fair   Psychomotor Activity  Psychomotor Activity: Normal  Musculoskeletal: Strength & Muscle Tone: within normal limits Gait & Station: normal Assets  Assets: Manufacturing systems engineer; Desire for Improvement    Physical Exam: Physical Exam ROS Blood pressure (!) 142/98, pulse 88, temperature  98.3 F (36.8 C), temperature source Oral, resp. rate 18, height 6' 1 (1.854 m), weight 108 kg, SpO2 98%. Body mass index is 31.4 kg/m.  Diagnosis: Principal Problem:   Alcohol use disorder Active Problems:   Chest pain   Major depressive disorder, recurrent severe without psychotic features (HCC)   PLAN: Safety and Monitoring:  -- Voluntary admission to inpatient psychiatric unit for safety, stabilization and  treatment  -- Daily contact with patient to assess and evaluate symptoms and progress in treatment  -- Patient's case to be discussed in multi-disciplinary team meeting  -- Observation Level : q15 minute checks  -- Vital signs:  q12 hours  -- Precautions: suicide, elopement, and assault -- Encouraged patient to participate in unit milieu and in scheduled group therapies  2. Psychiatric Diagnoses and Treatment:             MDD, recurrent, severe, rule out bipolar disorder             Alcohol use disorder             Anxiety disorder Continue gabapentin 600 mg 3 times daily Continue Abilify 5 mg nightly Increase Prozac to 20 mg daily Patient wishes to avoid Zoloft due to previous abuse of this medication CIWA protocol -- The risks/benefits/side-effects/alternatives to this medication were discussed in detail with the patient and time was given for questions. The patient consents to medication trial.                -- Metabolic profile and EKG monitoring obtained while on an atypical antipsychotic (BMI: Lipid Panel: HbgA1c: QTc:)              -- Encouraged patient to participate in unit milieu and in scheduled group therapies    Hospital Course:     3. Medical Issues Being Addressed:  Patient was evaluated by hospitalist for chest pain, determined to be muscular in origin, no treatment needed, symptomatic care, hospitalist has signed off.    4. Discharge Planning:   -- Social work and case management to assist with discharge planning and identification of hospital follow-up needs prior to discharge  -- Estimated LOS: 5-7 days  Camelia LITTIE Lukes, PA-C 04/12/2024, 3:39 PM

## 2024-04-12 NOTE — Progress Notes (Signed)
   04/12/24 0815  Psych Admission Type (Psych Patients Only)  Admission Status Involuntary  Psychosocial Assessment  Patient Complaints Anxiety (patient requesting Ativan ; secure chat sent to provider)  Eye Contact Intense  Facial Expression Animated  Affect Flat  Speech Rapid  Interaction Assertive;Manipulative  Motor Activity  (normal; not slow)  Appearance/Hygiene Unremarkable  Behavior Characteristics Agitated;Irritable  Mood Preoccupied;Irritable  Aggressive Behavior  Effect No apparent injury  Thought Process  Coherency WDL  Content WDL  Delusions None reported or observed  Perception WDL  Hallucination None reported or observed  Judgment WDL  Confusion WDL  Danger to Self  Current suicidal ideation? Denies  Danger to Others  Danger to Others None reported or observed

## 2024-04-12 NOTE — Group Note (Signed)
 Date:  04/12/2024 Time:  9:30 PM  Group Topic/Focus:  Orientation:   The focus of this group is to educate the patient on the purpose and policies of crisis stabilization and provide a format to answer questions about their admission.  The group details unit policies and expectations of patients while admitted. Wrap-Up Group:   The focus of this group is to help patients review their daily goal of treatment and discuss progress on daily workbooks.    Participation Level:  Active  Participation Quality:  Appropriate and Attentive  Affect:  Appropriate  Cognitive:  Appropriate  Insight: Appropriate and Good  Engagement in Group:  Engaged  Modes of Intervention:  Orientation  Additional Comments:     Christian Dickerson 04/12/2024, 9:30 PM

## 2024-04-13 DIAGNOSIS — F332 Major depressive disorder, recurrent severe without psychotic features: Secondary | ICD-10-CM | POA: Diagnosis not present

## 2024-04-13 DIAGNOSIS — F1090 Alcohol use, unspecified, uncomplicated: Secondary | ICD-10-CM | POA: Diagnosis not present

## 2024-04-13 DIAGNOSIS — R079 Chest pain, unspecified: Secondary | ICD-10-CM | POA: Diagnosis not present

## 2024-04-13 MED ORDER — HYDROXYZINE HCL 25 MG PO TABS
25.0000 mg | ORAL_TABLET | Freq: Three times a day (TID) | ORAL | Status: DC | PRN
  Administered 2024-04-13 – 2024-04-19 (×6): 25 mg via ORAL
  Filled 2024-04-13 (×6): qty 1

## 2024-04-13 NOTE — [facility] Counselor (Signed)
 CSW met with the patient to review SUD facilities that patient was interested in.  CSW informed patient of the ones that he needed to call.   CSW provided patient with applications for [facility], [facility], [facility] [facility] and [facility] [employer].  Once applications are filled out CSW will send to respective parties fro review.  CSW provided patient with contact information for [facility] so he can complete his interview.  CSW provided patient with contact information for [facility].  Patient reports that he completed interview.  Supporting documents have been sent to [facility].  Supporting documents sent to [facility].  CSW to continue to follow up.   Sherryle Margo, MSW, LCSW 04/13/2024 11:26 AM

## 2024-04-13 NOTE — Group Note (Signed)
 Date:  04/13/2024 Time:  9:27 PM  Group Topic/Focus:  Wellness Toolbox:   The focus of this group is to discuss various aspects of wellness, balancing those aspects and exploring ways to increase the ability to experience wellness.  Patients will create a wellness toolbox for use upon discharge.    Participation Level:  Active  Participation Quality:  Appropriate, Attentive, Sharing, and Supportive  Affect:  Appropriate  Cognitive:  Appropriate  Insight: Appropriate  Engagement in Group:  Engaged  Modes of Intervention:  Discussion and Socialization  Additional Comments:     Kerri Katz 04/13/2024, 9:27 PM

## 2024-04-13 NOTE — Progress Notes (Signed)
   04/13/24 0900  Psych Admission Type (Psych Patients Only)  Admission Status Involuntary  Psychosocial Assessment  Patient Complaints None  Eye Contact Fair  Facial Expression Other (Comment) (WNL)  Affect Flat  Speech Soft  Interaction Assertive  Motor Activity Other (Comment) (WNL)  Appearance/Hygiene Unremarkable  Behavior Characteristics Appropriate to situation  Mood Pleasant  Thought Process  Coherency WDL  Content WDL  Delusions None reported or observed  Perception WDL  Hallucination None reported or observed  Judgment WDL  Confusion WDL  Danger to Self  Current suicidal ideation? Denies

## 2024-04-13 NOTE — [facility] Counselor (Signed)
 CSW faxed referrals to [facility] and [employer].  CSW received confirmation that fax was successful.  CSW emailed referral to [facility].   CSW has contacted [facility] for information on where to send application, CSW left HIPAA compliant voicemail.   All faxes were successful.   CSW has sent the email to [facility] [facility].  Sherryle Margo, MSW, LCSW 04/13/2024 3:54 PM

## 2024-04-13 NOTE — [facility] Suicide Risk Assessment (Signed)
 [facility] INPATIENT:  Family/Significant Other Suicide Prevention Education  Suicide Prevention Education:  Contact Attempts: Jeffrrey Vogul, [phone], Brother;has been identified by the patient as the family member/significant other with whom the patient will be residing, and identified as the person(s) who will aid the patient in the event of a mental health crisis.  With written consent from the patient, two attempts were made to provide suicide prevention education, prior to and/or following the patient's discharge.  We were unsuccessful in providing suicide prevention education.  A suicide education pamphlet was given to the patient to share with family/significant other.  Date and time of first attempt:04/12/2024 at 3:57PM  Date and time of second attempt:04/13/2024 at 1:29PM  CSW unable to speak with patient's brother, per message, number was not in working order.   Christian Dickerson 04/13/2024, 1:28 PM

## 2024-04-13 NOTE — Progress Notes (Signed)
   04/13/24 2120  Psych Admission Type (Psych Patients Only)  Admission Status Involuntary  Psychosocial Assessment  Patient Complaints Anxiety  Eye Contact Fair  Facial Expression Anxious  Affect Anxious  Speech Soft  Interaction Assertive  Motor Activity Other (Comment) (WNL)  Appearance/Hygiene Unremarkable  Behavior Characteristics Appropriate to situation  Mood Pleasant  Thought Process  Coherency WDL  Content WDL  Delusions None reported or observed  Perception WDL  Hallucination None reported or observed  Judgment WDL  Confusion WDL  Danger to Self  Current suicidal ideation? Denies  Agreement Not to Harm Self Yes  Description of Agreement verbal  Danger to Others  Danger to Others None reported or observed

## 2024-04-13 NOTE — Group Note (Signed)
 [facility] LCSW Group Therapy Note   Group Date: 04/13/2024 Start Time: 1300 End Time: 1350   Type of Therapy/Topic:  Group Therapy:  Emotion Regulation  Participation Level:  Did Not Attend    Description of Group:    The purpose of this group is to assist patients in learning to regulate negative emotions and experience positive emotions. Patients will be guided to discuss ways in which they have been vulnerable to their negative emotions. These vulnerabilities will be juxtaposed with experiences of positive emotions or situations, and patients challenged to use positive emotions to combat negative ones. Special emphasis will be placed on coping with negative emotions in conflict situations, and patients will process healthy conflict resolution skills.  Therapeutic Goals: Patient will identify two positive emotions or experiences to reflect on in order to balance out negative emotions:  Patient will label two or more emotions that they find the most difficult to experience:  Patient will be able to demonstrate positive conflict resolution skills through discussion or role plays:   Summary of Patient Progress: Patient did not attend the group.   Therapeutic Modalities:   Cognitive Behavioral Therapy Feelings Identification Dialectical Behavioral Therapy   Christian JONELLE Fam, LCSW

## 2024-04-13 NOTE — Plan of Care (Signed)
   Problem: Education: Goal: Emotional status will improve Outcome: Progressing Goal: Mental status will improve Outcome: Progressing

## 2024-04-13 NOTE — Plan of Care (Signed)
  Problem: Education: Goal: Mental status will improve Outcome: Progressing   

## 2024-04-13 NOTE — Group Note (Unsigned)
 Date:  04/13/2024 Time:  9:00 PM  Group Topic/Focus:  Wellness Toolbox:   The focus of this group is to discuss various aspects of wellness, balancing those aspects and exploring ways to increase the ability to experience wellness.  Patients will create a wellness toolbox for use upon discharge.     Participation Level:  {[facility] PARTICIPATION OZCZO:77735}  Participation Quality:  {[facility] PARTICIPATION QUALITY:22265}  Affect:  {[facility] AFFECT:22266}  Cognitive:  {[facility] COGNITIVE:22267}  Insight: {[facility] Insight2:20797}  Engagement in Group:  {[facility] ENGAGEMENT IN HMNLE:77731}  Modes of Intervention:  {[facility] MODES OF INTERVENTION:22269}  Additional Comments:  ***  Kerri Katz 04/13/2024, 9:00 PM

## 2024-04-13 NOTE — Progress Notes (Incomplete)
 [facility] MD Progress Note  04/13/2024 8:13 PM Christian Dickerson  MRN:  [id number]   Subjective:  Chart reviewed, case discussed in multidisciplinary meeting, patient seen during rounds.   10/8: On interview today, patient is noted to be sitting in his room.  He is calm and cooperative initially, but becomes visibly frustrated when discussing barriers to finding housing.  Patient is currently unhoused and is facing legal charges for DUI.  He reports depression is a 7-8 out of 10 today. He reports situational anxiety regarding pending legal charges and obtaining housing.  Patient reports an episode of increased anxiety and agitation that occurred last night, during which he reported racing heart, chest tightness, and shortness of breath, consistent with panic attack.  He received as needed medication for this, which she states was effective and well-tolerated.  He states during this episode he became upset and felt like he wants to punch a couple of people, but he did not feel compelled to act on this.  He denies current SI/HI/plan and denies hallucinations.  He reports his chest pain is controlled with lidocaine  patch and Tylenol  per hospitalist recommendation.  Patient reports tolerating current medication regimen well without adverse effects.   10/7: On interview today, patient is found interacting in the milieu.  He is calm and cooperative, alert and oriented.  He reports persistent symptoms of depression anxiety, though does note some improvement, which he attributes to psychotropic medications recently started, Abilify and Prozac.  He is tolerating current medication regimen well without adverse effects.  He is agreeable to Prozac dose increased at this time.  He noted some night sweats and tremors today, tremors have since resolved.  He denies SI/HI/plan and denies hallucinations.  He reports chest pain, which was determined to be muscular in origin by hospitalist, is controlled with lidocaine  patch and  Tylenol .  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  He is counseled by provider that compression stocking will not be allowed on unit at this time due to potential ligature risk.  Patient verbalizes understanding.  10/6: On interview today, patient is found seated in his room.  He is calm and cooperative and engaged in interview.  He reports depression as a 0 out of 10, and anxiety as a 10 out of 10, stating his upcoming court date and attempts to get in touch with a public defender has contributed to his anxiety.  He reports tolerating current medication regimen well without adverse effects.  He denies SI/HI/plan and denies hallucinations.  He reports night sweat and nausea; on CIWA protocol. Patient seen by dietician for nutritional assessment and management.  Patient was evaluated by hospitalist for chest pain, determined to be muscular in origin, no treatment needed, hospitalist has signed off.    Sleep: Good  Appetite:  Good  Past Psychiatric History: see h&P Family History: History reviewed. No pertinent family history. Social History:  Social History   Substance and Sexual Activity  Alcohol Use Yes   Alcohol/week: 35.0 standard drinks of alcohol   Types: 35 Shots of liquor per week   Comment: 2 fifths a day     Social History   Substance and Sexual Activity  Drug Use Yes   Types: Marijuana    Social History   Socioeconomic History   Marital status: Single    Spouse name: Not on file   Number of children: Not on file   Years of education: Not on  file   Highest education level: Not on file  Occupational History   Not on file  Tobacco Use   Smoking status: Every Day    Current packs/day: 1.00    Average packs/day: 1 pack/day for 1.1 years (1.1 ttl pk-yrs)    Types: Cigarettes    Start date: 03/08/2023    Passive exposure: Current   Smokeless tobacco: Never  Substance and Sexual Activity   Alcohol use: Yes     Alcohol/week: 35.0 standard drinks of alcohol    Types: 35 Shots of liquor per week    Comment: 2 fifths a day   Drug use: Yes    Types: Marijuana   Sexual activity: Not Currently  Other Topics Concern   Not on file  Social History Narrative   Not on file   Social Drivers of Health   Financial Resource Strain: Not on file  Food Insecurity: No Food Insecurity (04/09/2024)   Hunger Vital Sign    Worried About Running Out of Food in the Last Year: Never true    Ran Out of Food in the Last Year: Never true  Recent Concern: Food Insecurity - Food Insecurity Present (03/31/2024)   Hunger Vital Sign    Worried About Running Out of Food in the Last Year: Sometimes true    Ran Out of Food in the Last Year: Never true  Transportation Needs: Unmet Transportation Needs (04/09/2024)   PRAPARE - Administrator, Civil Service (Medical): Yes    Lack of Transportation (Non-Medical): Yes  Physical Activity: Not on file  Stress: Not on file  Social Connections: Socially Isolated (03/31/2024)   Social Connection and Isolation Panel    Frequency of Communication with Friends and Family: Once a week    Frequency of Social Gatherings with Friends and Family: Never    Attends Religious Services: Never    Database administrator or Organizations: No    Attends Engineer, structural: Never    Marital Status: Never married   Past Medical History:  Past Medical History:  Diagnosis Date   Alcohol abuse    Diverticulitis     Past Surgical History:  Procedure Laterality Date   ANKLE SURGERY     CHEST TUBE INSERTION     LITHOTRIPSY     TIBIA FRACTURE SURGERY     TONSILLECTOMY AND ADENOIDECTOMY      Current Medications: Current Facility-Administered Medications  Medication Dose Route Frequency Provider Last Rate Last Admin   acetaminophen  (TYLENOL ) tablet 650 mg  650 mg Oral Q6H PRN Hampton, Tracie B, NP   650 mg at 04/12/24 0815   alum & mag hydroxide-simeth (MAALOX/MYLANTA)  200-200-20 MG/5ML suspension 30 mL  30 mL Oral Q4H PRN Hampton, Tracie B, NP       amLODipine  (NORVASC ) tablet 5 mg  5 mg Oral Daily Hampton, Tracie B, NP   5 mg at 04/13/24 0830   ARIPiprazole (ABILIFY) tablet 5 mg  5 mg Oral QHS Bular Hickok L, PA-C   5 mg at 04/12/24 2116   ciprofloxacin  (CIPRO ) tablet 500 mg  500 mg Oral BID Hampton, Tracie B, NP   500 mg at 04/13/24 0830   haloperidol (HALDOL) tablet 5 mg  5 mg Oral TID PRN Hampton, Tracie B, NP   5 mg at 04/12/24 2116   And   diphenhydrAMINE  (BENADRYL ) capsule 50 mg  50 mg Oral TID PRN Hampton, Tracie B, NP   50 mg at 04/12/24 2116  haloperidol lactate (HALDOL) injection 5 mg  5 mg Intramuscular TID PRN Hampton, Tracie B, NP       And   diphenhydrAMINE  (BENADRYL ) injection 50 mg  50 mg Intramuscular TID PRN Hampton, Tracie B, NP       And   LORazepam  (ATIVAN ) injection 2 mg  2 mg Intramuscular TID PRN Hampton, Tracie B, NP       feeding supplement (ENSURE PLUS HIGH PROTEIN) liquid 237 mL  237 mL Oral BID BM Jadapalle, Sree, MD   237 mL at 04/13/24 0831   FLUoxetine (PROZAC) capsule 20 mg  20 mg Oral Daily Sahej Schrieber L, PA-C   20 mg at 04/13/24 0830   folic acid  (FOLVITE ) tablet 1 mg  1 mg Oral Daily Jadapalle, Sree, MD   1 mg at 04/13/24 0830   gabapentin (NEURONTIN) capsule 600 mg  600 mg Oral TID Ruie Sendejo L, PA-C   600 mg at 04/13/24 1621   hydrOXYzine (ATARAX) tablet 25 mg  25 mg Oral TID PRN Spyridon Hornstein L, PA-C   25 mg at 04/13/24 1620   lidocaine  (LIDODERM ) 5 % 1-2 patch  1-2 patch Transdermal Q24H Cox, Amy N, DO   1 patch at 04/13/24 1215   magnesium hydroxide (MILK OF MAGNESIA) suspension 30 mL  30 mL Oral Daily PRN Hampton, Tracie B, NP       metroNIDAZOLE  (FLAGYL ) tablet 500 mg  500 mg Oral BID Hampton, Tracie B, NP   500 mg at 04/13/24 1621   multivitamin with minerals tablet 1 tablet  1 tablet Oral Daily Jadapalle, Sree, MD   1 tablet at 04/13/24 0830   nicotine  (NICODERM CQ  - dosed in mg/24 hours) patch 21  mg  21 mg Transdermal Daily Hampton, Tracie B, NP   21 mg at 04/13/24 0831   thiamine  (VITAMIN B1) tablet 100 mg  100 mg Oral Daily Jadapalle, Sree, MD   100 mg at 04/13/24 0830   traZODone (DESYREL) tablet 50 mg  50 mg Oral QHS PRN Hampton, Tracie B, NP   50 mg at 04/12/24 2116    Lab Results:  No results found for this or any previous visit (from the past 48 hours).   Blood Alcohol level:  Lab Results  Component Value Date   ETH 354 (HH) 04/08/2024   ETH 309 (HH) 04/06/2024    Metabolic Disorder Labs: No results found for: HGBA1C, MPG No results found for: PROLACTIN No results found for: CHOL, TRIG, HDL, CHOLHDL, VLDL, LDLCALC  Physical Findings: AIMS:  , ,  ,  ,    CIWA:  CIWA-Ar Total: 0 COWS:      Psychiatric Specialty Exam:  Presentation  General Appearance:  Appropriate for Environment  Eye Contact: Good  Speech: Clear and Coherent  Speech Volume: Normal    Mood and Affect  Mood: Depressed, anxious  Affect: Congruent   Thought Process  Thought Processes: Linear  Descriptions of Associations:Intact  Orientation:Full (Time, Place and Person)  Thought Content:WDL  Hallucinations: None  Ideas of Reference:None  Suicidal Thoughts: No  Homicidal Thoughts: No    Sensorium  Memory: Immediate Fair; Recent Fair; Remote Fair  Judgment: Fair  Insight: Fair   Art therapist  Concentration: Fair  Attention Span: Fair  Recall: [employer] of Knowledge: Fair  Language: Fair   Psychomotor Activity  Psychomotor Activity: Normal  Musculoskeletal: Strength & Muscle Tone: within normal limits Gait & Station: normal Assets  Assets: Manufacturing systems engineer; Desire for Improvement  Physical Exam: Physical Exam ROS Blood pressure (!) 150/100, pulse 84, temperature 98 F (36.7 C), resp. rate 18, height 6' 1 (1.854 m), weight 108 kg, SpO2 99%. Body mass index is 31.4 kg/m.  Diagnosis: Principal  Problem:   Alcohol use disorder Active Problems:   Chest pain   Major depressive disorder, recurrent severe without psychotic features (HCC)   PLAN: Safety and Monitoring:  -- Voluntary admission to inpatient psychiatric unit for safety, stabilization and treatment  -- Daily contact with patient to assess and evaluate symptoms and progress in treatment  -- Patient's case to be discussed in multi-disciplinary team meeting  -- Observation Level : q15 minute checks  -- Vital signs:  q12 hours  -- Precautions: suicide, elopement, and assault -- Encouraged patient to participate in unit milieu and in scheduled group therapies   2. Psychiatric Diagnoses and Treatment:             MDD, recurrent, severe, rule out bipolar disorder             Alcohol use disorder             Anxiety disorder Continue gabapentin 600 mg 3 times daily Continue Abilify 5 mg nightly Continue Prozac 20 mg daily Patient wishes to avoid Zoloft due to previous abuse of this medication CIWA protocol -- The risks/benefits/side-effects/alternatives to this medication were discussed in detail with the patient and time was given for questions. The patient consents to medication trial.                -- Metabolic profile and EKG monitoring obtained while on an atypical antipsychotic (BMI: Lipid Panel: HbgA1c: QTc:)              -- Encouraged patient to participate in unit milieu and in scheduled group therapies    Hospital Course:     3. Medical Issues Being Addressed:  Patient was evaluated by hospitalist for chest pain, determined to be muscular in origin, no treatment needed, symptomatic care, hospitalist has signed off.    4. Discharge Planning:   -- Social work and case management to assist with discharge planning and identification of hospital follow-up needs prior to discharge  -- Estimated LOS: 5-7 days  Camelia LITTIE Lukes, PA-C 04/13/2024, 8:13 PM

## 2024-04-14 DIAGNOSIS — F1090 Alcohol use, unspecified, uncomplicated: Secondary | ICD-10-CM | POA: Diagnosis not present

## 2024-04-14 DIAGNOSIS — F332 Major depressive disorder, recurrent severe without psychotic features: Secondary | ICD-10-CM | POA: Diagnosis not present

## 2024-04-14 DIAGNOSIS — R079 Chest pain, unspecified: Secondary | ICD-10-CM

## 2024-04-14 NOTE — Progress Notes (Signed)
 [facility] MD Progress Note  04/14/2024 3:54 PM Christian Dickerson  MRN:  [id number]   Subjective:  Chart reviewed, case discussed in multidisciplinary meeting, patient seen during rounds.   10/9: On interview today, patient is noted to be sitting in his room.  He is calm and cooperative, alert and oriented.  He reports night sweats continue to improve.  He denies other symptoms of alcohol withdrawal.  He reports anxiety is currently 10 out of 10 due to the difficult process involved in finding housing.  Despite this, he reports making progress in finding placement upon discharge. He rates depression as 0/10 today.  He denies SI/HI/plan and denies hallucinations.  Prior to admission, patient was undergoing antibiotic therapy for diverticulitis, which was continued on admission.  He has completed course of ciprofloxacin  and will complete course of metronidazole  on 04/15/2024.  10/8: On interview today, patient is noted to be sitting in his room.  He is calm and cooperative initially, but becomes visibly frustrated when discussing barriers to finding housing.  Patient is currently unhoused and is facing legal charges for DUI.  He reports depression is a 7-8 out of 10 today. He reports situational anxiety regarding pending legal charges and obtaining housing.  Patient reports an episode of increased anxiety and agitation that occurred last night, during which he reported racing heart, chest tightness, and shortness of breath, consistent with panic attack.  He received as needed medication for this, which she states was effective and well-tolerated.  He states during this episode he became upset and felt like he wants to punch a couple of people, but he did not feel compelled to act on this.  He denies current SI/HI/plan and denies hallucinations.  He reports his chest pain is controlled with lidocaine  patch and Tylenol  per hospitalist recommendation.  Patient reports tolerating current medication regimen well without  adverse effects.   10/7: On interview today, patient is found interacting in the milieu.  He is calm and cooperative, alert and oriented.  He reports persistent symptoms of depression anxiety, though does note some improvement, which he attributes to psychotropic medications recently started, Abilify and Prozac.  He is tolerating current medication regimen well without adverse effects.  He is agreeable to Prozac dose increased at this time.  He noted some night sweats and tremors today, tremors have since resolved.  He denies SI/HI/plan and denies hallucinations.  He reports chest pain, which was determined to be muscular in origin by hospitalist, is controlled with lidocaine  patch and Tylenol .  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  He is counseled by provider that compression stocking will not be allowed on unit at this time due to potential ligature risk.  Patient verbalizes understanding.  10/6: On interview today, patient is found seated in his room.  He is calm and cooperative and engaged in interview.  He reports depression as a 0 out of 10, and anxiety as a 10 out of 10, stating his upcoming court date and attempts to get in touch with a public defender has contributed to his anxiety.  He reports tolerating current medication regimen well without adverse effects.  He denies SI/HI/plan and denies hallucinations.  He reports night sweat and nausea; on CIWA protocol. Patient seen by dietician for nutritional assessment and management.  Patient was evaluated by hospitalist for chest pain, determined to be muscular in origin, no treatment needed, hospitalist has signed off.    Sleep: Good  Appetite:  Good  Past Psychiatric History: see h&P Family History: History reviewed. No pertinent family history. Social History:  Social History   Substance and Sexual Activity  Alcohol Use Yes   Alcohol/week: 35.0 standard drinks of alcohol   Types:  35 Shots of liquor per week   Comment: 2 fifths a day     Social History   Substance and Sexual Activity  Drug Use Yes   Types: Marijuana    Social History   Socioeconomic History   Marital status: Single    Spouse name: Not on file   Number of children: Not on file   Years of education: Not on file   Highest education level: Not on file  Occupational History   Not on file  Tobacco Use   Smoking status: Every Day    Current packs/day: 1.00    Average packs/day: 1 pack/day for 1.1 years (1.1 ttl pk-yrs)    Types: Cigarettes    Start date: 03/08/2023    Passive exposure: Current   Smokeless tobacco: Never  Substance and Sexual Activity   Alcohol use: Yes    Alcohol/week: 35.0 standard drinks of alcohol    Types: 35 Shots of liquor per week    Comment: 2 fifths a day   Drug use: Yes    Types: Marijuana   Sexual activity: Not Currently  Other Topics Concern   Not on file  Social History Narrative   Not on file   Social Drivers of Health   Financial Resource Strain: Not on file  Food Insecurity: No Food Insecurity (04/09/2024)   Hunger Vital Sign    Worried About Running Out of Food in the Last Year: Never true    Ran Out of Food in the Last Year: Never true  Recent Concern: Food Insecurity - Food Insecurity Present (03/31/2024)   Hunger Vital Sign    Worried About Running Out of Food in the Last Year: Sometimes true    Ran Out of Food in the Last Year: Never true  Transportation Needs: Unmet Transportation Needs (04/09/2024)   PRAPARE - Administrator, Civil Service (Medical): Yes    Lack of Transportation (Non-Medical): Yes  Physical Activity: Not on file  Stress: Not on file  Social Connections: Socially Isolated (03/31/2024)   Social Connection and Isolation Panel    Frequency of Communication with Friends and Family: Once a week    Frequency of Social Gatherings with Friends and Family: Never    Attends Religious Services: Never    Doctor, general practice or Organizations: No    Attends Engineer, structural: Never    Marital Status: Never married   Past Medical History:  Past Medical History:  Diagnosis Date   Alcohol abuse    Diverticulitis     Past Surgical History:  Procedure Laterality Date   ANKLE SURGERY     CHEST TUBE INSERTION     LITHOTRIPSY     TIBIA FRACTURE SURGERY     TONSILLECTOMY AND ADENOIDECTOMY      Current Medications: Current Facility-Administered Medications  Medication Dose Route Frequency Provider Last Rate Last Admin   acetaminophen  (TYLENOL ) tablet 650 mg  650 mg Oral Q6H PRN Hampton, Tracie B, NP   650 mg at 04/12/24 0815   alum & mag hydroxide-simeth (MAALOX/MYLANTA) 200-200-20 MG/5ML suspension 30 mL  30 mL Oral Q4H PRN Hampton, Tracie B, NP       amLODipine  (NORVASC ) tablet 5 mg  5  mg Oral Daily Hampton, Tracie B, NP   5 mg at 04/14/24 0917   ARIPiprazole (ABILIFY) tablet 5 mg  5 mg Oral QHS Bryer Gottsch L, PA-C   5 mg at 04/13/24 2115   ciprofloxacin  (CIPRO ) tablet 500 mg  500 mg Oral BID Hampton, Tracie B, NP   500 mg at 04/14/24 0917   haloperidol (HALDOL) tablet 5 mg  5 mg Oral TID PRN Hampton, Tracie B, NP   5 mg at 04/13/24 2120   And   diphenhydrAMINE  (BENADRYL ) capsule 50 mg  50 mg Oral TID PRN Hampton, Tracie B, NP   50 mg at 04/13/24 2122   haloperidol lactate (HALDOL) injection 5 mg  5 mg Intramuscular TID PRN Hampton, Tracie B, NP       And   diphenhydrAMINE  (BENADRYL ) injection 50 mg  50 mg Intramuscular TID PRN Hampton, Tracie B, NP       And   LORazepam  (ATIVAN ) injection 2 mg  2 mg Intramuscular TID PRN Hampton, Tracie B, NP       feeding supplement (ENSURE PLUS HIGH PROTEIN) liquid 237 mL  237 mL Oral BID BM Jadapalle, Sree, MD   237 mL at 04/13/24 0831   FLUoxetine (PROZAC) capsule 20 mg  20 mg Oral Daily Shawnette Augello L, PA-C   20 mg at 04/14/24 9082   folic acid  (FOLVITE ) tablet 1 mg  1 mg Oral Daily Jadapalle, Sree, MD   1 mg at 04/14/24 9082   gabapentin  (NEURONTIN) capsule 600 mg  600 mg Oral TID Tucker Steedley L, PA-C   600 mg at 04/14/24 1129   hydrOXYzine (ATARAX) tablet 25 mg  25 mg Oral TID PRN Ormand Senn L, PA-C   25 mg at 04/14/24 1129   lidocaine  (LIDODERM ) 5 % 1-2 patch  1-2 patch Transdermal Q24H Cox, Amy N, DO   2 patch at 04/14/24 1513   magnesium hydroxide (MILK OF MAGNESIA) suspension 30 mL  30 mL Oral Daily PRN Hampton, Tracie B, NP       metroNIDAZOLE  (FLAGYL ) tablet 500 mg  500 mg Oral BID Hampton, Tracie B, NP   500 mg at 04/14/24 0917   multivitamin with minerals tablet 1 tablet  1 tablet Oral Daily Jadapalle, Sree, MD   1 tablet at 04/14/24 9082   nicotine  (NICODERM CQ  - dosed in mg/24 hours) patch 21 mg  21 mg Transdermal Daily Hampton, Tracie B, NP   21 mg at 04/14/24 9081   thiamine  (VITAMIN B1) tablet 100 mg  100 mg Oral Daily Jadapalle, Sree, MD   100 mg at 04/14/24 0917   traZODone (DESYREL) tablet 50 mg  50 mg Oral QHS PRN Hampton, Tracie B, NP   50 mg at 04/13/24 2115    Lab Results:  No results found for this or any previous visit (from the past 48 hours).   Blood Alcohol level:  Lab Results  Component Value Date   ETH 354 (HH) 04/08/2024   ETH 309 (HH) 04/06/2024    Metabolic Disorder Labs: No results found for: HGBA1C, MPG No results found for: PROLACTIN No results found for: CHOL, TRIG, HDL, CHOLHDL, VLDL, LDLCALC  Physical Findings: AIMS:  , ,  ,  ,    CIWA:  CIWA-Ar Total: 1 COWS:      Psychiatric Specialty Exam:  Presentation  General Appearance:  Appropriate for Environment  Eye Contact: Good  Speech: Clear and Coherent  Speech Volume: Normal    Mood and Affect  Mood: Anxious  Affect: Congruent   Thought Process  Thought Processes: Linear  Descriptions of Associations:Intact  Orientation:Full (Time, Place and Person)  Thought Content:WDL  Hallucinations: None  Ideas of Reference:None  Suicidal Thoughts: No  Homicidal Thoughts: No     Sensorium  Memory: Immediate Fair; Recent Fair; Remote Fair  Judgment: Fair  Insight: Fair   Art therapist  Concentration: Fair  Attention Span: Fair  Recall: [employer] of Knowledge: Fair  Language: Fair   Psychomotor Activity  Psychomotor Activity: Normal  Musculoskeletal: Strength & Muscle Tone: within normal limits Gait & Station: normal Assets  Assets: Manufacturing systems engineer; Desire for Improvement    Physical Exam: Physical Exam ROS Blood pressure 108/74, pulse 65, temperature (!) 97.2 F (36.2 C), resp. rate 20, height 6' 1 (1.854 m), weight 108 kg, SpO2 98%. Body mass index is 31.4 kg/m.  Diagnosis: Principal Problem:   Alcohol use disorder Active Problems:   Chest pain   Major depressive disorder, recurrent severe without psychotic features (HCC)   PLAN: Safety and Monitoring:  -- Voluntary admission to inpatient psychiatric unit for safety, stabilization and treatment  -- Daily contact with patient to assess and evaluate symptoms and progress in treatment  -- Patient's case to be discussed in multi-disciplinary team meeting  -- Observation Level : q15 minute checks  -- Vital signs:  q12 hours  -- Precautions: suicide, elopement, and assault -- Encouraged patient to participate in unit milieu and in scheduled group therapies   2. Psychiatric Diagnoses and Treatment:             MDD, recurrent, severe, rule out bipolar disorder             Alcohol use disorder             Anxiety disorder Continue gabapentin 600 mg 3 times daily Continue Abilify 5 mg nightly Continue Prozac 20 mg daily Patient wishes to avoid Zoloft due to previous abuse of this medication CIWA protocol -- The risks/benefits/side-effects/alternatives to this medication were discussed in detail with the patient and time was given for questions. The patient consents to medication trial.                -- Metabolic profile and EKG monitoring obtained while on  an atypical antipsychotic (BMI: Lipid Panel: HbgA1c: QTc:)              -- Encouraged patient to participate in unit milieu and in scheduled group therapies    Hospital Course:     3. Medical Issues Being Addressed:  Patient was evaluated by hospitalist for chest pain, determined to be muscular in origin, no treatment needed, symptomatic care, hospitalist has signed off.  Antibiotic therapy continued for diverticulitis, patient has completed course of ciprofloxacin  and will complete course of metronidazole  tomorrow, 04/15/2024.   4. Discharge Planning:   -- Social work and case management to assist with discharge planning and identification of hospital follow-up needs prior to discharge  -- Estimated LOS: 5-7 days  [employer], PA-C 04/14/2024, 3:54 PM

## 2024-04-14 NOTE — Progress Notes (Signed)
 Pt calm and pleasant during assessment denying SI/HI/AVH. Pt observed by this Clinical research associate interacting appropriately with staff and peers on the unit. Pt complaint with medication administration per MD orders. Pt given education, support, and encouragement to be active in his treatment plan. Pt being monitored Q 15 minutes for safety per unit protocol, remains safe on the unit

## 2024-04-14 NOTE — Group Note (Signed)
 Date:  04/14/2024 Time:  10:15 PM  Group Topic/Focus:  Coping With Mental Health Crisis:   The purpose of this group is to help patients identify strategies for coping with mental health crisis.  Group discusses possible causes of crisis and ways to manage them effectively.    Participation Level:  Active  Participation Quality:  Appropriate  Affect:  Appropriate  Cognitive:  Appropriate  Insight: Appropriate  Engagement in Group:  Engaged  Modes of Intervention:  Discussion  Additional Comments:    Christian Dickerson 04/14/2024, 10:15 PM

## 2024-04-14 NOTE — Group Note (Signed)
 Recreation Therapy Group Note   Group Topic:Animal Assisted Therapy   Group Date: 04/14/2024 Start Time: 1400 End Time: 1430 Facilitators: Celestia Jeoffrey BRAVO, LRT, CTRS Location: Craft Room  Group Description: AAA. Animal-Assisted Activity provides opportunities for motivational, educational, therapeutic and/or recreational benefits to enhance quality of life. Christian Dickerson and Rollo visited the unit to interact with patients.    Goal Areas Addressed:  Reduced anxiety and stress Improved mood Increased social interaction Enhanced communication skills Reduced loneliness and isolation Improved emotional regulation   Affect/Mood: Appropriate   Participation Level: Active and Engaged   Participation Quality: Independent   Behavior: Appropriate, Calm, and Cooperative   Speech/Thought Process: Coherent   Insight: Good   Judgement: Good   Modes of Intervention: Activity   Patient Response to Interventions:  Attentive, Engaged, and Receptive   Education Outcome:  Acknowledges education   Clinical Observations/Individualized Feedback: Christian Dickerson was active in their participation of session activities and group discussion.    Plan: Continue to engage patient in RT group sessions 2-3x/week.   Jeoffrey BRAVO Celestia, LRT, CTRS 04/14/2024 5:04 PM

## 2024-04-14 NOTE — Plan of Care (Signed)
   Problem: Education: Goal: Knowledge of Christian Dickerson General Education information/materials will improve Outcome: Progressing Goal: Emotional status will improve Outcome: Progressing Goal: Mental status will improve Outcome: Progressing Goal: Verbalization of understanding the information provided will improve Outcome: Progressing   Problem: Activity: Goal: Interest or engagement in activities will improve Outcome: Progressing Goal: Sleeping patterns will improve Outcome: Progressing   Problem: Coping: Goal: Ability to verbalize frustrations and anger appropriately will improve Outcome: Progressing Goal: Ability to demonstrate self-control will improve Outcome: Progressing

## 2024-04-14 NOTE — BHH Counselor (Signed)
 CSW has sent application to [employer].  Sherryle Margo, MSW, LCSW 04/14/2024 1:27 PM

## 2024-04-14 NOTE — Plan of Care (Signed)
  Problem: Education: Goal: Emotional status will improve Outcome: Progressing   Problem: Education: Goal: Mental status will improve Outcome: Progressing   Problem: Education: Goal: Verbalization of understanding the information provided will improve Outcome: Progressing   Problem: Coping: Goal: Ability to verbalize frustrations and anger appropriately will improve Outcome: Progressing   Problem: Health Behavior/Discharge Planning: Goal: Identification of resources available to assist in meeting health care needs will improve Outcome: Progressing   Problem: Health Behavior/Discharge Planning: Goal: Compliance with treatment plan for underlying cause of condition will improve Outcome: Progressing   Problem: Coping: Goal: Will verbalize feelings Outcome: Progressing   Problem: Self-Concept: Goal: Level of anxiety will decrease Outcome: Progressing

## 2024-04-14 NOTE — BHH Counselor (Signed)
 CSW contacted [employer] to follow up on patient's referral.  CSW spoke with staff who report that the patient needs updated vitals from PCP or current providers and letter documenting that court date has been extended (PO, attorney, etc). Reported that pt stated that court date is on 05/10/2024 and if no letter is received patient can be admitted after the 4th.   Staff was unsure why it was needed.  CSW spoke with Karna Pouch.  She reports that CSW can reach out to her at [email or web address] ([email or web address]) or call Paul at [phone] or [email or web address].  CSW called and left HIPAA compliant voicemail.  CSW contacted [employer] and CSW left HIPAA compliant message.  Sherryle Margo, MSW, LCSW 04/14/2024 3:40 PM

## 2024-04-14 NOTE — Group Note (Signed)
 Recreation Therapy Group Note   Group Topic:General Recreation  Group Date: 04/14/2024 Start Time: 1000 End Time: 1100 Facilitators: Celestia Jeoffrey BRAVO, LRT, CTRS Location: Courtyard  Group Description: [employer]. LRT and patients played games of basketball, drew with chalk, and played corn hole while outside in the courtyard while getting fresh air and sunlight. Music was being played in the background. LRT and peers conversed about different games they have played before, what they do in their free time and anything else that is on their minds. LRT encouraged pts to drink water after being outside, sweating and getting their heart rate up.  Goal Area(s) Addressed: Patient will build on frustration tolerance skills. Patients will partake in a competitive play game with peers. Patients will gain knowledge of new leisure interest/hobby.     Affect/Mood: Appropriate   Participation Level: Active and Engaged   Participation Quality: Independent   Behavior: Appropriate and Cooperative   Speech/Thought Process: Coherent   Insight: Fair   Judgement: Fair    Modes of Intervention: Activity   Patient Response to Interventions:  Attentive, Engaged, and Receptive   Education Outcome:  Acknowledges education   Clinical Observations/Individualized Feedback: Celvin was active in their participation of session activities and group discussion. Pt interacted well with LRT and peers duration of session.    Plan: Continue to engage patient in RT group sessions 2-3x/week.   Jeoffrey BRAVO Celestia, LRT, CTRS 04/14/2024 11:11 AM

## 2024-04-14 NOTE — BHH Group Notes (Signed)
 Spirituality Group   Description: Participant directed exploration of values, beliefs and meaning  **Focus on: self compassion/authentic self, acceptance, here and now, coping with pain  Following a brief framework of chaplain's role and ground rules of group behavior, participants are invited to share concerns or questions that engage spiritual life. Emphasis placed on common themes and shared experiences and ways to make meaning and clarify living into one's values.   Theory/Process/Goal: Utilize the theoretical framework of group therapy established by Celena Kite, Relational Cultural Theory and Rogerian approaches to facilitate relational empathy and use of the "here and now" to foster reflection, self-awareness, and sharing.   Observations: Sidra was an active participant in the group discussion.  Mckinzi Eriksen L. Delores HERO.Div

## 2024-04-14 NOTE — Group Note (Signed)
 [facility] LCSW Group Therapy Note   Group Date: 04/14/2024 Start Time: 1300 End Time: 1400   Type of Therapy/Topic:  Group Therapy:  Emotion Regulation  Participation Level:  Active   Mood: Appropriate   Description of Group:    The purpose of this group is to assist patients in learning to regulate negative emotions and experience positive emotions. Patients will be guided to discuss ways in which they have been vulnerable to their negative emotions. These vulnerabilities will be juxtaposed with experiences of positive emotions or situations, and patients challenged to use positive emotions to combat negative ones. Special emphasis will be placed on coping with negative emotions in conflict situations, and patients will process healthy conflict resolution skills.  Therapeutic Goals: Patient will identify two positive emotions or experiences to reflect on in order to balance out negative emotions:  Patient will label two or more emotions that they find the most difficult to experience:  Patient will be able to demonstrate positive conflict resolution skills through discussion or role plays:   Summary of Patient Progress:   During group, patient and group explored the ways in which our thoughts can impact our feelings which impacts our behaviors. The patient actively engaged in the group session and shared personal strategies for regulating a range of emotions. The group, along with the facilitator, explored various situations that triggered both safe and unsafe feelings, and discussed effective methods for emotional regulation to support improved outcomes. The patient was open, supportive of peers, and receptive to feedback, contributing positively to the group dynamic.      Therapeutic Modalities:   Cognitive Behavioral Therapy Feelings Identification Dialectical Behavioral Therapy   Alveta CHRISTELLA Kerns, LCSW

## 2024-04-14 NOTE — Progress Notes (Signed)
   04/14/24 1300  Psych Admission Type (Psych Patients Only)  Admission Status Involuntary  Psychosocial Assessment  Patient Complaints Anxiety  Eye Contact Fair  Facial Expression Anxious  Affect Anxious  Speech Soft  Interaction Assertive  Motor Activity Other (Comment) (WNL)  Appearance/Hygiene Unremarkable  Behavior Characteristics Cooperative;Appropriate to situation  Mood Pleasant  Thought Process  Coherency WDL  Content WDL  Delusions None reported or observed  Perception WDL  Hallucination None reported or observed  Judgment WDL  Confusion None  Danger to Self  Current suicidal ideation? Denies  Agreement Not to Harm Self Yes  Description of Agreement Verbal  Danger to Others  Danger to Others None reported or observed

## 2024-04-15 DIAGNOSIS — F1090 Alcohol use, unspecified, uncomplicated: Secondary | ICD-10-CM | POA: Diagnosis not present

## 2024-04-15 DIAGNOSIS — R079 Chest pain, unspecified: Secondary | ICD-10-CM | POA: Diagnosis not present

## 2024-04-15 DIAGNOSIS — F332 Major depressive disorder, recurrent severe without psychotic features: Secondary | ICD-10-CM | POA: Diagnosis not present

## 2024-04-15 NOTE — Plan of Care (Signed)
   Problem: Education: Goal: Emotional status will improve Outcome: Progressing Goal: Mental status will improve Outcome: Progressing

## 2024-04-15 NOTE — Progress Notes (Signed)
   04/15/24 0820  Psych Admission Type (Psych Patients Only)  Admission Status Involuntary  Psychosocial Assessment  Patient Complaints Anxiety  Eye Contact Fair  Facial Expression Anxious  Affect Anxious  Speech Soft  Interaction Assertive  Motor Activity Slow  Appearance/Hygiene Unremarkable  Behavior Characteristics Cooperative  Mood Pleasant  Aggressive Behavior  Effect No apparent injury  Thought Process  Coherency WDL  Content WDL  Delusions None reported or observed  Perception WDL  Hallucination None reported or observed  Judgment WDL  Confusion None  Danger to Self  Current suicidal ideation? Denies  Description of Suicide Plan no plan  Self-Injurious Behavior No self-injurious ideation or behavior indicators observed or expressed   Agreement Not to Harm Self Yes  Description of Agreement verbal  Danger to Others  Danger to Others None reported or observed

## 2024-04-15 NOTE — Progress Notes (Addendum)
 [facility] MD Progress Note  04/15/2024 9:19 PM Christian Dickerson  MRN:  [id number]   Subjective:  Chart reviewed, case discussed in multidisciplinary meeting, patient seen during rounds.   10/10: On interview today, patient is noted to be pleasant and cooperative.  He reports having a productive day as he continues to work on finding housing.  Patient reports he is motivated to maintain sobriety.  He rates depression at 0 out of 10 today and anxiety as 2 out of 10 today.  He denies SI/HI/plan and denies hallucinations.  Per social work team he requires testing for HIV, TB, and hepatitis in order to be accepted at a certain residential placement.  Patient reports tolerating current medication regimen well without adverse effects.  He does not voice any concerns or complaints today.  10/9: On interview today, patient is noted to be sitting in his room.  He is calm and cooperative, alert and oriented.  He reports night sweats continue to improve.  He denies other symptoms of alcohol withdrawal.  He reports anxiety is currently 10 out of 10 due to the difficult process involved in finding housing.  Despite this, he reports making progress in finding placement upon discharge. He rates depression as 0/10 today.  He denies SI/HI/plan and denies hallucinations.  Prior to admission, patient was undergoing antibiotic therapy for diverticulitis, which was continued on admission.  He has completed course of ciprofloxacin  and will complete course of metronidazole  on 04/15/2024.  10/8: On interview today, patient is noted to be sitting in his room.  He is calm and cooperative initially, but becomes visibly frustrated when discussing barriers to finding housing.  Patient is currently unhoused and is facing legal charges for DUI.  He reports depression is a 7-8 out of 10 today. He reports situational anxiety regarding pending legal charges and obtaining housing.  Patient reports an episode of increased anxiety and agitation  that occurred last night, during which he reported racing heart, chest tightness, and shortness of breath, consistent with panic attack.  He received as needed medication for this, which she states was effective and well-tolerated.  He states during this episode he became upset and felt like he wants to punch a couple of people, but he did not feel compelled to act on this.  He denies current SI/HI/plan and denies hallucinations.  He reports his chest pain is controlled with lidocaine  patch and Tylenol  per hospitalist recommendation.  Patient reports tolerating current medication regimen well without adverse effects.   10/7: On interview today, patient is found interacting in the milieu.  He is calm and cooperative, alert and oriented.  He reports persistent symptoms of depression anxiety, though does note some improvement, which he attributes to psychotropic medications recently started, Abilify and Prozac.  He is tolerating current medication regimen well without adverse effects.  He is agreeable to Prozac dose increased at this time.  He noted some night sweats and tremors today, tremors have since resolved.  He denies SI/HI/plan and denies hallucinations.  He reports chest pain, which was determined to be muscular in origin by hospitalist, is controlled with lidocaine  patch and Tylenol .  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  He is counseled by provider that compression stocking will not be allowed on unit at this time due to potential ligature risk.  Patient verbalizes understanding.  10/6: On interview today, patient is found seated in his room.  He is calm and cooperative  and engaged in interview.  He reports depression as a 0 out of 10, and anxiety as a 10 out of 10, stating his upcoming court date and attempts to get in touch with a public defender has contributed to his anxiety.  He reports tolerating current medication regimen well without  adverse effects.  He denies SI/HI/plan and denies hallucinations.  He reports night sweat and nausea; on CIWA protocol. Patient seen by dietician for nutritional assessment and management.  Patient was evaluated by hospitalist for chest pain, determined to be muscular in origin, no treatment needed, hospitalist has signed off.    Sleep: Good  Appetite:  Good  Past Psychiatric History: see h&P Family History: History reviewed. No pertinent family history. Social History:  Social History   Substance and Sexual Activity  Alcohol Use Yes   Alcohol/week: 35.0 standard drinks of alcohol   Types: 35 Shots of liquor per week   Comment: 2 fifths a day     Social History   Substance and Sexual Activity  Drug Use Yes   Types: Marijuana    Social History   Socioeconomic History   Marital status: Single    Spouse name: Not on file   Number of children: Not on file   Years of education: Not on file   Highest education level: Not on file  Occupational History   Not on file  Tobacco Use   Smoking status: Every Day    Current packs/day: 1.00    Average packs/day: 1 pack/day for 1.1 years (1.1 ttl pk-yrs)    Types: Cigarettes    Start date: 03/08/2023    Passive exposure: Current   Smokeless tobacco: Never  Substance and Sexual Activity   Alcohol use: Yes    Alcohol/week: 35.0 standard drinks of alcohol    Types: 35 Shots of liquor per week    Comment: 2 fifths a day   Drug use: Yes    Types: Marijuana   Sexual activity: Not Currently  Other Topics Concern   Not on file  Social History Narrative   Not on file   Social Drivers of Health   Financial Resource Strain: Not on file  Food Insecurity: No Food Insecurity (04/09/2024)   Hunger Vital Sign    Worried About Running Out of Food in the Last Year: Never true    Ran Out of Food in the Last Year: Never true  Recent Concern: Food Insecurity - Food Insecurity Present (03/31/2024)   Hunger Vital Sign    Worried About Running Out  of Food in the Last Year: Sometimes true    Ran Out of Food in the Last Year: Never true  Transportation Needs: Unmet Transportation Needs (04/09/2024)   PRAPARE - Administrator, Civil Service (Medical): Yes    Lack of Transportation (Non-Medical): Yes  Physical Activity: Not on file  Stress: Not on file  Social Connections: Socially Isolated (03/31/2024)   Social Connection and Isolation Panel    Frequency of Communication with Friends and Family: Once a week    Frequency of Social Gatherings with Friends and Family: Never    Attends Religious Services: Never    Database administrator or Organizations: No    Attends Engineer, structural: Never    Marital Status: Never married   Past Medical History:  Past Medical History:  Diagnosis Date   Alcohol abuse    Diverticulitis     Past Surgical History:  Procedure Laterality Date  ANKLE SURGERY     CHEST TUBE INSERTION     LITHOTRIPSY     TIBIA FRACTURE SURGERY     TONSILLECTOMY AND ADENOIDECTOMY      Current Medications: Current Facility-Administered Medications  Medication Dose Route Frequency Provider Last Rate Last Admin   acetaminophen  (TYLENOL ) tablet 650 mg  650 mg Oral Q6H PRN Hampton, Tracie B, NP   650 mg at 04/12/24 0815   alum & mag hydroxide-simeth (MAALOX/MYLANTA) 200-200-20 MG/5ML suspension 30 mL  30 mL Oral Q4H PRN Hampton, Tracie B, NP       amLODipine  (NORVASC ) tablet 5 mg  5 mg Oral Daily Hampton, Tracie B, NP   5 mg at 04/15/24 0821   ARIPiprazole (ABILIFY) tablet 5 mg  5 mg Oral QHS Charyl Minervini L, PA-C   5 mg at 04/15/24 2111   haloperidol (HALDOL) tablet 5 mg  5 mg Oral TID PRN Hampton, Tracie B, NP   5 mg at 04/15/24 2111   And   diphenhydrAMINE  (BENADRYL ) capsule 50 mg  50 mg Oral TID PRN Hampton, Tracie B, NP   50 mg at 04/15/24 2110   haloperidol lactate (HALDOL) injection 5 mg  5 mg Intramuscular TID PRN Hampton, Tracie B, NP       And   diphenhydrAMINE  (BENADRYL ) injection 50  mg  50 mg Intramuscular TID PRN Hampton, Tracie B, NP       And   LORazepam  (ATIVAN ) injection 2 mg  2 mg Intramuscular TID PRN Hampton, Tracie B, NP       feeding supplement (ENSURE PLUS HIGH PROTEIN) liquid 237 mL  237 mL Oral BID BM Jadapalle, Sree, MD   237 mL at 04/13/24 0831   FLUoxetine (PROZAC) capsule 20 mg  20 mg Oral Daily Sulma Ruffino L, PA-C   20 mg at 04/15/24 9178   folic acid  (FOLVITE ) tablet 1 mg  1 mg Oral Daily Jadapalle, Sree, MD   1 mg at 04/15/24 0821   gabapentin (NEURONTIN) capsule 600 mg  600 mg Oral TID Tj Kitchings L, PA-C   600 mg at 04/15/24 1640   hydrOXYzine (ATARAX) tablet 25 mg  25 mg Oral TID PRN Shamaria Kavan L, PA-C   25 mg at 04/15/24 1234   magnesium hydroxide (MILK OF MAGNESIA) suspension 30 mL  30 mL Oral Daily PRN Hampton, Tracie B, NP       multivitamin with minerals tablet 1 tablet  1 tablet Oral Daily Jadapalle, Sree, MD   1 tablet at 04/15/24 9178   nicotine  (NICODERM CQ  - dosed in mg/24 hours) patch 21 mg  21 mg Transdermal Daily Hampton, Tracie B, NP   21 mg at 04/15/24 9177   traZODone (DESYREL) tablet 50 mg  50 mg Oral QHS PRN Hampton, Tracie B, NP   50 mg at 04/15/24 2107    Lab Results:  No results found for this or any previous visit (from the past 48 hours).   Blood Alcohol level:  Lab Results  Component Value Date   ETH 354 (HH) 04/08/2024   ETH 309 (HH) 04/06/2024    Metabolic Disorder Labs: No results found for: HGBA1C, MPG No results found for: PROLACTIN No results found for: CHOL, TRIG, HDL, CHOLHDL, VLDL, LDLCALC  Physical Findings: AIMS:  , ,  ,  ,    CIWA:  CIWA-Ar Total: 0 COWS:      Psychiatric Specialty Exam:  Presentation  General Appearance:  Appropriate for Environment  Eye Contact: Good  Speech:  Clear and Coherent  Speech Volume: Normal    Mood and Affect  Mood: Anxious  Affect: Congruent   Thought Process  Thought Processes: Linear  Descriptions of  Associations:Intact  Orientation:Full (Time, Place and Person)  Thought Content:WDL  Hallucinations: None  Ideas of Reference:None  Suicidal Thoughts: No  Homicidal Thoughts: No    Sensorium  Memory: Immediate Fair; Recent Fair; Remote Fair  Judgment: Fair  Insight: Fair   Art therapist  Concentration: Fair  Attention Span: Fair  Recall: [employer] of Knowledge: Fair  Language: Fair   Psychomotor Activity  Psychomotor Activity: Normal  Musculoskeletal: Strength & Muscle Tone: within normal limits Gait & Station: normal Assets  Assets: Manufacturing systems engineer; Desire for Improvement    Physical Exam: Physical Exam ROS Blood pressure (!) 146/91, pulse 89, temperature (!) 97.5 F (36.4 C), resp. rate 18, height 6' 1 (1.854 m), weight 108 kg, SpO2 97%. Body mass index is 31.4 kg/m.  Diagnosis: Principal Problem:   Alcohol use disorder Active Problems:   Chest pain   Major depressive disorder, recurrent severe without psychotic features (HCC)   PLAN: Safety and Monitoring:  -- Voluntary admission to inpatient psychiatric unit for safety, stabilization and treatment  -- Daily contact with patient to assess and evaluate symptoms and progress in treatment  -- Patient's case to be discussed in multi-disciplinary team meeting  -- Observation Level : q15 minute checks  -- Vital signs:  q12 hours  -- Precautions: suicide, elopement, and assault -- Encouraged patient to participate in unit milieu and in scheduled group therapies   2. Psychiatric Diagnoses and Treatment:             MDD, recurrent, severe, rule out bipolar disorder             Alcohol use disorder             Anxiety disorder Continue gabapentin 600 mg 3 times daily Continue Abilify 5 mg nightly Continue Prozac 20 mg daily Patient wishes to avoid Zoloft due to previous abuse of this medication CIWA protocol -- The risks/benefits/side-effects/alternatives to this medication  were discussed in detail with the patient and time was given for questions. The patient consents to medication trial.                -- Metabolic profile and EKG monitoring obtained while on an atypical antipsychotic (BMI: Lipid Panel: HbgA1c: QTc:)              -- Encouraged patient to participate in unit milieu and in scheduled group therapies    Hospital Course:     3. Medical Issues Being Addressed:  Patient was evaluated by hospitalist for chest pain, determined to be muscular in origin, no treatment needed, symptomatic care, hospitalist has signed off.  Antibiotic therapy continued for diverticulitis, patient has completed course of ciprofloxacin  and will complete course of metronidazole  04/15/2024.   4. Discharge Planning:           -- Tuesday  -- Social work and case management to assist with discharge planning and identification of hospital follow-up needs prior to discharge  -- Estimated LOS: 5-7 days  [employer], PA-C 04/15/2024, 9:19 PM

## 2024-04-15 NOTE — Group Note (Signed)
 Recreation Therapy Group Note   Group Topic:Leisure Education  Group Date: 04/15/2024 Start Time: 1530 End Time: 1630 Facilitators: Celestia Jeoffrey BRAVO, LRT, CTRS Location: Craft Room  Group Description: Leisure. Patients were given the option to choose from journaling, coloring, drawing, making origami, playing with playdoh, listening to music or singing karaoke. LRT and pts discussed the meaning of leisure, the importance of participating in leisure during their free time/when they're outside of the hospital, as well as how our leisure interests can also serve as coping skills.   Goal Area(s) Addressed:  Patient will identify a current leisure interest.  Patient will learn the definition of "leisure". Patient will practice making a positive decision. Patient will have the opportunity to try a new leisure activity. Patient will communicate with peers and LRT.    Affect/Mood: Appropriate   Participation Level: Moderate    Clinical Observations/Individualized Feedback: Christian Dickerson came late to group. Pt was present for less than half.   Plan: Continue to engage patient in RT group sessions 2-3x/week.   Jeoffrey BRAVO Celestia, LRT, CTRS 04/15/2024 5:04 PM

## 2024-04-15 NOTE — Group Note (Signed)
 Recreation Therapy Group Note   Group Topic:General Recreation  Group Date: 04/15/2024 Start Time: 1100 End Time: 1150 Facilitators: Celestia Jeoffrey BRAVO, LRT, CTRS Location: Courtyard  Group Description: [employer]. LRT and patients played games of basketball, drew with chalk, and played corn hole while outside in the courtyard while getting fresh air and sunlight. Music was being played in the background. LRT and peers conversed about different games they have played before, what they do in their free time and anything else that is on their minds. LRT encouraged pts to drink water after being outside, sweating and getting their heart rate up.  Goal Area(s) Addressed: Patient will build on frustration tolerance skills. Patients will partake in a competitive play game with peers. Patients will gain knowledge of new leisure interest/hobby.    Affect/Mood: Appropriate   Participation Level: Active   Participation Quality: Independent   Behavior: Appropriate   Speech/Thought Process: Coherent   Insight: Good   Judgement: Good   Modes of Intervention: Socialization   Patient Response to Interventions:  Receptive   Education Outcome:  Acknowledges education   Clinical Observations/Individualized Feedback: Gabe was active in their participation of session activities and group discussion. Pt interacted well with LRT and peers duration of session.    Plan: Continue to engage patient in RT group sessions 2-3x/week.   Jeoffrey BRAVO Celestia, LRT, CTRS 04/15/2024 11:58 AM

## 2024-04-15 NOTE — Plan of Care (Signed)
   Problem: Education: Goal: Knowledge of [facility] Education information/materials will improve Outcome: Progressing Goal: Emotional status will improve Outcome: Progressing Goal: Mental status will improve Outcome: Progressing Goal: Verbalization of understanding the information provided will improve Outcome: Progressing

## 2024-04-15 NOTE — Group Note (Signed)
 Date:  04/15/2024 Time:  2:28 PM  Group Topic/Focus:  Conflict Resolution:   The focus of this group is to discuss the conflict resolution process and how it may be used upon discharge.    Participation Level:  Active  Participation Quality:  Appropriate  Affect:  Appropriate  Cognitive:  Appropriate  Insight: Appropriate  Engagement in Group:  Engaged  Modes of Intervention:  Activity  Additional Comments:    Camellia HERO Rakwon Letourneau 04/15/2024, 2:28 PM

## 2024-04-15 NOTE — B[facility] Counselor (Signed)
 CSW returned call to [facility] with [facility] Department of Social Services, [phone].  CSW left a HIPAA compliant voicemail requesting a return phone call.  Sherryle Margo, MSW, LCSW 04/15/2024 3:32 PM

## 2024-04-15 NOTE — B[facility] Counselor (Signed)
 CSW spoke with the patient.    CSW assisted the patient in scheduled for a PCP appointment.   CSW informed patient that he needed to call [facility] to continue referral.  CSW informed the patient that [facility] is interested in the patient and he could be admitted on Tuesday 04/19/2024.  However, pt needs to call the staff at 12noon.  Pt expressed understanding and plan to call.  CSW was informed that patient will not be able to take psychotropic medications while at this facility.  Sherryle Margo, MSW, LCSW 04/15/2024 12:22 PM

## 2024-04-15 NOTE — Group Note (Signed)
 Date:  04/15/2024 Time:  1:53 PM  Group Topic/Focus:  Emotional Education:   The focus of this group is to discuss what feelings/emotions are, and how they are experienced.    Participation Level:  Active  Participation Quality:  Appropriate  Affect:  Appropriate  Cognitive:  Appropriate  Insight: Appropriate  Engagement in Group:  Engaged  Modes of Intervention:  Activity  Additional Comments:    Camellia HERO Hosam Mcfetridge 04/15/2024, 1:53 PM

## 2024-04-16 DIAGNOSIS — R079 Chest pain, unspecified: Secondary | ICD-10-CM | POA: Diagnosis not present

## 2024-04-16 DIAGNOSIS — F332 Major depressive disorder, recurrent severe without psychotic features: Secondary | ICD-10-CM | POA: Diagnosis not present

## 2024-04-16 DIAGNOSIS — F1090 Alcohol use, unspecified, uncomplicated: Secondary | ICD-10-CM | POA: Diagnosis not present

## 2024-04-16 MED ORDER — GABAPENTIN 300 MG PO CAPS
300.0000 mg | ORAL_CAPSULE | Freq: Three times a day (TID) | ORAL | Status: DC
  Filled 2024-04-16: qty 1

## 2024-04-16 NOTE — Plan of Care (Signed)
  Problem: Education: Goal: Knowledge of [place] [facility] Education information/materials will improve 04/16/2024 0703 by Scarlet Jannett Humbles, RN Outcome: Progressing 04/16/2024 0703 by Scarlet Jannett Humbles, RN Outcome: Progressing   Problem: Education: Goal: Emotional status will improve 04/16/2024 0703 by Scarlet Jannett Humbles, RN Outcome: Progressing 04/16/2024 0703 by Scarlet Jannett Humbles, RN Outcome: Progressing   Problem: Education: Goal: Mental status will improve 04/16/2024 0703 by Scarlet Jannett Humbles, RN Outcome: Progressing 04/16/2024 0703 by Scarlet Jannett Humbles, RN Outcome: Progressing

## 2024-04-16 NOTE — Progress Notes (Signed)
   04/16/24 1800  Psych Admission Type (Psych Patients Only)  Admission Status Involuntary  Psychosocial Assessment  Patient Complaints None  Eye Contact Fair  Facial Expression Other (Comment) (appropriate)  Affect Appropriate to circumstance  Speech Logical/coherent  Interaction Assertive  Motor Activity Slow  Appearance/Hygiene Unremarkable  Behavior Characteristics Cooperative;Appropriate to situation  Mood Pleasant  Aggressive Behavior  Effect No apparent injury  Thought Process  Coherency WDL  Content WDL  Delusions None reported or observed  Perception WDL  Hallucination None reported or observed  Judgment WDL  Confusion None  Danger to Self  Current suicidal ideation? Denies  Self-Injurious Behavior No self-injurious ideation or behavior indicators observed or expressed   Agreement Not to Harm Self Yes  Description of Agreement Verbal  Danger to Others  Danger to Others None reported or observed   Patient's goal for today, per his self-inventory is to stop drinking.

## 2024-04-16 NOTE — Plan of Care (Signed)
  Problem: Education: Goal: Knowledge of [facility] Education information/materials will improve Outcome: Progressing   Problem: Education: Goal: Emotional status will improve Outcome: Progressing   Problem: Education: Goal: Mental status will improve Outcome: Progressing   

## 2024-04-16 NOTE — Progress Notes (Signed)
 [facility] MD Progress Note  04/16/2024 2:18 PM Christian Dickerson  MRN:  [id number]   Subjective:  Chart reviewed, case discussed in multidisciplinary meeting, patient seen during rounds.   10/11: Patient seen today for follow-up they are alert and oriented.  They are pleasant and cooperative on exam.  They continue to see placement at sober living facilities and reportedly have been accepted to the [facility] in [place] for Tuesday.  Lab work was ordered yesterday by the provider lab was not, nursing was instructed to reach out to them.  They deny SI, HI, and AVH.  They indicate they are performing their ADLs.  They report stable mood appetite and sleep. Concern they notice ongoing night sweats.  Discussed that the facility they would like to go to does not allow for psychotropic medications indicated will stop the Prozac, abilify, and gabapentin, discussed potential discontinuation sxs.  10/10: On interview today, patient is noted to be pleasant and cooperative.  He reports having a productive day as he continues to work on finding housing.  Patient reports he is motivated to maintain sobriety.  He rates depression at 0 out of 10 today and anxiety as 2 out of 10 today.  He denies SI/HI/plan and denies hallucinations.  Per social work team he requires testing for HIV, TB, and hepatitis in order to be accepted at a certain residential placement.  Patient reports tolerating current medication regimen well without adverse effects.  He does not voice any concerns or complaints today.  10/9: On interview today, patient is noted to be sitting in his room.  He is calm and cooperative, alert and oriented.  He reports night sweats continue to improve.  He denies other symptoms of alcohol withdrawal.  He reports anxiety is currently 10 out of 10 due to the difficult process involved in finding housing.  Despite this, he reports making progress in finding placement upon discharge. He rates depression as 0/10 today.   He denies SI/HI/plan and denies hallucinations.  Prior to admission, patient was undergoing antibiotic therapy for diverticulitis, which was continued on admission.  He has completed course of ciprofloxacin  and will complete course of metronidazole  on 04/15/2024.  10/8: On interview today, patient is noted to be sitting in his room.  He is calm and cooperative initially, but becomes visibly frustrated when discussing barriers to finding housing.  Patient is currently unhoused and is facing legal charges for DUI.  He reports depression is a 7-8 out of 10 today. He reports situational anxiety regarding pending legal charges and obtaining housing.  Patient reports an episode of increased anxiety and agitation that occurred last night, during which he reported racing heart, chest tightness, and shortness of breath, consistent with panic attack.  He received as needed medication for this, which she states was effective and well-tolerated.  He states during this episode he became upset and felt like he wants to punch a couple of people, but he did not feel compelled to act on this.  He denies current SI/HI/plan and denies hallucinations.  He reports his chest pain is controlled with lidocaine  patch and Tylenol  per hospitalist recommendation.  Patient reports tolerating current medication regimen well without adverse effects.   10/7: On interview today, patient is found interacting in the milieu.  He is calm and cooperative, alert and oriented.  He reports persistent symptoms of depression anxiety, though does note some improvement, which he attributes to psychotropic medications recently started, Abilify and Prozac.  He is tolerating current  medication regimen well without adverse effects.  He is agreeable to Prozac dose increased at this time.  He noted some night sweats and tremors today, tremors have since resolved.  He denies SI/HI/plan and denies hallucinations.  He reports chest pain, which was determined to  be muscular in origin by hospitalist, is controlled with lidocaine  patch and Tylenol .  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  He is counseled by provider that compression stocking will not be allowed on unit at this time due to potential ligature risk.  Patient verbalizes understanding.  10/6: On interview today, patient is found seated in his room.  He is calm and cooperative and engaged in interview.  He reports depression as a 0 out of 10, and anxiety as a 10 out of 10, stating his upcoming court date and attempts to get in touch with a public defender has contributed to his anxiety.  He reports tolerating current medication regimen well without adverse effects.  He denies SI/HI/plan and denies hallucinations.  He reports night sweat and nausea; on CIWA protocol. Patient seen by dietician for nutritional assessment and management.  Patient was evaluated by hospitalist for chest pain, determined to be muscular in origin, no treatment needed, hospitalist has signed off.    Sleep: Good  Appetite:  Good  Past Psychiatric History: see h&P Family History: History reviewed. No pertinent family history. Social History:  Social History   Substance and Sexual Activity  Alcohol Use Yes   Alcohol/week: 35.0 standard drinks of alcohol   Types: 35 Shots of liquor per week   Comment: 2 fifths a day     Social History   Substance and Sexual Activity  Drug Use Yes   Types: Marijuana    Social History   Socioeconomic History   Marital status: Single    Spouse name: Not on file   Number of children: Not on file   Years of education: Not on file   Highest education level: Not on file  Occupational History   Not on file  Tobacco Use   Smoking status: Every Day    Current packs/day: 1.00    Average packs/day: 1 pack/day for 1.1 years (1.1 ttl pk-yrs)    Types: Cigarettes    Start date: 03/08/2023    Passive exposure: Current   Smokeless  tobacco: Never  Substance and Sexual Activity   Alcohol use: Yes    Alcohol/week: 35.0 standard drinks of alcohol    Types: 35 Shots of liquor per week    Comment: 2 fifths a day   Drug use: Yes    Types: Marijuana   Sexual activity: Not Currently  Other Topics Concern   Not on file  Social History Narrative   Not on file   Social Drivers of Health   Financial Resource Strain: Not on file  Food Insecurity: No Food Insecurity (04/09/2024)   Hunger Vital Sign    Worried About Running Out of Food in the Last Year: Never true    Ran Out of Food in the Last Year: Never true  Recent Concern: Food Insecurity - Food Insecurity Present (03/31/2024)   Hunger Vital Sign    Worried About Running Out of Food in the Last Year: Sometimes true    Ran Out of Food in the Last Year: Never true  Transportation Needs: Unmet Transportation Needs (04/09/2024)   PRAPARE - Administrator, Civil Service (Medical): Yes  Lack of Transportation (Non-Medical): Yes  Physical Activity: Not on file  Stress: Not on file  Social Connections: Socially Isolated (03/31/2024)   Social Connection and Isolation Panel    Frequency of Communication with Friends and Family: Once a week    Frequency of Social Gatherings with Friends and Family: Never    Attends Religious Services: Never    Diplomatic Services operational officer: No    Attends Engineer, structural: Never    Marital Status: Never married   Past Medical History:  Past Medical History:  Diagnosis Date   Alcohol abuse    Diverticulitis     Past Surgical History:  Procedure Laterality Date   ANKLE SURGERY     CHEST TUBE INSERTION     LITHOTRIPSY     TIBIA FRACTURE SURGERY     TONSILLECTOMY AND ADENOIDECTOMY      Current Medications: Current Facility-Administered Medications  Medication Dose Route Frequency Provider Last Rate Last Admin   acetaminophen  (TYLENOL ) tablet 650 mg  650 mg Oral Q6H PRN Hampton, Tracie B, NP   650  mg at 04/12/24 0815   alum & mag hydroxide-simeth (MAALOX/MYLANTA) 200-200-20 MG/5ML suspension 30 mL  30 mL Oral Q4H PRN Hampton, Tracie B, NP       amLODipine  (NORVASC ) tablet 5 mg  5 mg Oral Daily Hampton, Tracie B, NP   5 mg at 04/16/24 9177   haloperidol (HALDOL) tablet 5 mg  5 mg Oral TID PRN Hampton, Tracie B, NP   5 mg at 04/15/24 2111   And   diphenhydrAMINE  (BENADRYL ) capsule 50 mg  50 mg Oral TID PRN Hampton, Tracie B, NP   50 mg at 04/15/24 2110   haloperidol lactate (HALDOL) injection 5 mg  5 mg Intramuscular TID PRN Hampton, Tracie B, NP       And   diphenhydrAMINE  (BENADRYL ) injection 50 mg  50 mg Intramuscular TID PRN Hampton, Tracie B, NP       And   LORazepam  (ATIVAN ) injection 2 mg  2 mg Intramuscular TID PRN Hampton, Tracie B, NP       feeding supplement (ENSURE PLUS HIGH PROTEIN) liquid 237 mL  237 mL Oral BID BM Jadapalle, Sree, MD   237 mL at 04/16/24 1030   folic acid  (FOLVITE ) tablet 1 mg  1 mg Oral Daily Jadapalle, Sree, MD   1 mg at 04/16/24 9177   gabapentin (NEURONTIN) capsule 300 mg  300 mg Oral TID Jennavieve Arrick E, PA-C       hydrOXYzine (ATARAX) tablet 25 mg  25 mg Oral TID PRN Hunter, Crystal L, PA-C   25 mg at 04/15/24 1234   magnesium hydroxide (MILK OF MAGNESIA) suspension 30 mL  30 mL Oral Daily PRN Hampton, Tracie B, NP       multivitamin with minerals tablet 1 tablet  1 tablet Oral Daily Jadapalle, Sree, MD   1 tablet at 04/16/24 9177   nicotine  (NICODERM CQ  - dosed in mg/24 hours) patch 21 mg  21 mg Transdermal Daily Hampton, Tracie B, NP   21 mg at 04/15/24 9177   traZODone (DESYREL) tablet 50 mg  50 mg Oral QHS PRN Hampton, Tracie B, NP   50 mg at 04/15/24 2107    Lab Results:  No results found for this or any previous visit (from the past 48 hours).   Blood Alcohol level:  Lab Results  Component Value Date   ETH 354 (HH) 04/08/2024   ETH 309 (HH)  04/06/2024    Metabolic Disorder Labs: No results found for: HGBA1C, MPG No results  found for: PROLACTIN No results found for: CHOL, TRIG, HDL, CHOLHDL, VLDL, LDLCALC  Physical Findings: AIMS:  , ,  ,  ,    CIWA:  CIWA-Ar Total: 0 COWS:      Psychiatric Specialty Exam:  Presentation  General Appearance:  Appropriate for Environment  Eye Contact: Good  Speech: Clear and Coherent  Speech Volume: Normal    Mood and Affect  Mood: Anxious  Affect: Congruent   Thought Process  Thought Processes: Linear  Descriptions of Associations:Intact  Orientation:Full (Time, Place and Person)  Thought Content:WDL  Hallucinations: None  Ideas of Reference:None  Suicidal Thoughts: No  Homicidal Thoughts: No    Sensorium  Memory: Immediate Fair; Recent Fair; Remote Fair  Judgment: Fair  Insight: Fair   Art therapist  Concentration: Fair  Attention Span: Fair  Recall: [employer] of Knowledge: Fair  Language: Fair   Psychomotor Activity  Psychomotor Activity: Normal  Musculoskeletal: Strength & Muscle Tone: within normal limits Gait & Station: normal Assets  Assets: Manufacturing systems engineer; Desire for Improvement    Physical Exam: Physical Exam ROS Blood pressure (!) 121/91, pulse 78, temperature (!) 97 F (36.1 C), resp. rate 19, height 6' 1 (1.854 m), weight 108 kg, SpO2 99%. Body mass index is 31.4 kg/m.  Diagnosis: Principal Problem:   Alcohol use disorder Active Problems:   Chest pain   Major depressive disorder, recurrent severe without psychotic features (HCC)   PLAN: Safety and Monitoring:  -- Voluntary admission to inpatient psychiatric unit for safety, stabilization and treatment  -- Daily contact with patient to assess and evaluate symptoms and progress in treatment  -- Patient's case to be discussed in multi-disciplinary team meeting  -- Observation Level : q15 minute checks  -- Vital signs:  q12 hours  -- Precautions: suicide, elopement, and assault -- Encouraged patient to  participate in unit milieu and in scheduled group therapies   2. Psychiatric Diagnoses and Treatment:             MDD, recurrent, severe, rule out bipolar disorder             Alcohol use disorder             Anxiety disorder D/c gabapentin 600 mg 3 times daily D/c Abilify 5 mg nightly D/c Prozac 20 mg daily Patient wishes to avoid Zoloft due to previous abuse of this medication CIWA protocol -- The risks/benefits/side-effects/alternatives to this medication were discussed in detail with the patient and time was given for questions. The patient consents to medication trial.                -- Metabolic profile and EKG monitoring obtained while on an atypical antipsychotic (BMI: Lipid Panel: HbgA1c: QTc:)              -- Encouraged patient to participate in unit milieu and in scheduled group therapies    Hospital Course:     3. Medical Issues Being Addressed:  Patient was evaluated by hospitalist for chest pain, determined to be muscular in origin, no treatment needed, symptomatic care, hospitalist has signed off.  Antibiotic therapy continued for diverticulitis, patient has completed course of ciprofloxacin  and will complete course of metronidazole  04/15/2024.   4. Discharge Planning:           -- Tuesday  -- Social work and case management to assist with discharge planning and identification of  hospital follow-up needs prior to discharge  -- Estimated LOS: 5-7 days  Donnice FORBES Right, PA-C 04/16/2024, 2:18 PM

## 2024-04-16 NOTE — Group Note (Signed)
 Date:  04/16/2024 Time:  8:55 PM  Group Topic/Focus:  Emotional Education:   The focus of this group is to discuss what feelings/emotions are, and how they are experienced. Making Healthy Choices:   The focus of this group is to help patients identify negative/unhealthy choices they were using prior to admission and identify positive/healthier coping strategies to replace them upon discharge. Self Care:   The focus of this group is to help patients understand the importance of self-care in order to improve or restore emotional, physical, spiritual, interpersonal, and financial health.    Participation Level:  Active  Participation Quality:  Appropriate and Attentive  Affect:  Appropriate  Cognitive:  Alert, Appropriate, and Oriented  Insight: Appropriate and Good  Engagement in Group:  Engaged  Modes of Intervention:  Discussion and Support  Additional Comments:  N/A  Butler LITTIE Gelineau 04/16/2024, 8:55 PM

## 2024-04-16 NOTE — Plan of Care (Signed)

## 2024-04-16 NOTE — Group Note (Signed)
 Date:  04/16/2024 Time:  4:26 PM  Group Topic/Focus:  Healthy Communication:   The focus of this group is to discuss communication, barriers to communication, as well as healthy ways to communicate with others.    Participation Level:  Active  Participation Quality:  Appropriate  Affect:  Appropriate  Cognitive:  Appropriate  Insight: Appropriate  Engagement in Group:  Engaged  Modes of Intervention:  Activity  Additional Comments:    Camellia HERO Dnaiel Voller 04/16/2024, 4:26 PM

## 2024-04-16 NOTE — BHH Counselor (Signed)
 The CSW contacted [facility] [employer], [place] at the patient request.   CSW left a HIPAA compliant voicemail requesting a return phone call.

## 2024-04-16 NOTE — BH IP Treatment Plan (Signed)
 Interdisciplinary Treatment and Diagnostic Plan Update  04/16/2024 Time of Session: 3:03 pm DESI ROWE MRN: [id number]  Principal Diagnosis: Alcohol use disorder  Secondary Diagnoses: Principal Problem:   Alcohol use disorder Active Problems:   Chest pain   Major depressive disorder, recurrent severe without psychotic features (HCC)   Current Medications:  Current Facility-Administered Medications  Medication Dose Route Frequency Provider Last Rate Last Admin   acetaminophen  (TYLENOL ) tablet 650 mg  650 mg Oral Q6H PRN Hampton, Tracie B, NP   650 mg at 04/12/24 0815   alum & mag hydroxide-simeth (MAALOX/MYLANTA) 200-200-20 MG/5ML suspension 30 mL  30 mL Oral Q4H PRN Hampton, Tracie B, NP       amLODipine  (NORVASC ) tablet 5 mg  5 mg Oral Daily Hampton, Tracie B, NP   5 mg at 04/16/24 9177   haloperidol (HALDOL) tablet 5 mg  5 mg Oral TID PRN Hampton, Tracie B, NP   5 mg at 04/15/24 2111   And   diphenhydrAMINE  (BENADRYL ) capsule 50 mg  50 mg Oral TID PRN Hampton, Tracie B, NP   50 mg at 04/15/24 2110   haloperidol lactate (HALDOL) injection 5 mg  5 mg Intramuscular TID PRN Hampton, Tracie B, NP       And   diphenhydrAMINE  (BENADRYL ) injection 50 mg  50 mg Intramuscular TID PRN Hampton, Tracie B, NP       And   LORazepam  (ATIVAN ) injection 2 mg  2 mg Intramuscular TID PRN Hampton, Tracie B, NP       feeding supplement (ENSURE PLUS HIGH PROTEIN) liquid 237 mL  237 mL Oral BID BM Jadapalle, Sree, MD   237 mL at 04/16/24 1030   folic acid  (FOLVITE ) tablet 1 mg  1 mg Oral Daily Jadapalle, Sree, MD   1 mg at 04/16/24 9177   hydrOXYzine (ATARAX) tablet 25 mg  25 mg Oral TID PRN Hunter, Crystal L, PA-C   25 mg at 04/15/24 1234   magnesium hydroxide (MILK OF MAGNESIA) suspension 30 mL  30 mL Oral Daily PRN Hampton, Tracie B, NP       multivitamin with minerals tablet 1 tablet  1 tablet Oral Daily Jadapalle, Sree, MD   1 tablet at 04/16/24 9177   nicotine  (NICODERM CQ  - dosed in mg/24  hours) patch 21 mg  21 mg Transdermal Daily Hampton, Tracie B, NP   21 mg at 04/15/24 9177   traZODone (DESYREL) tablet 50 mg  50 mg Oral QHS PRN Hampton, Tracie B, NP   50 mg at 04/15/24 2107   PTA Medications: Medications Prior to Admission  Medication Sig Dispense Refill Last Dose/Taking   acetaminophen  (TYLENOL ) 325 MG tablet Take 2 tablets (650 mg total) by mouth every 6 (six) hours as needed for mild pain (pain score 1-3) (or Fever >/= 100.4).   Unknown   amLODipine  (NORVASC ) 5 MG tablet Take 1 tablet (5 mg total) by mouth daily. 30 tablet 0 Unknown   [EXPIRED] ciprofloxacin  (CIPRO ) 500 MG tablet Take 1 tablet (500 mg total) by mouth 2 (two) times daily for 10 days. 20 tablet 0 Unknown   [EXPIRED] metroNIDAZOLE  (FLAGYL ) 500 MG tablet Take 1 tablet (500 mg total) by mouth 2 (two) times daily for 10 days. 20 tablet 0 Unknown    Patient Stressors:    Patient Strengths:    Treatment Modalities: Medication Management, Group therapy, Case management,  1 to 1 session with clinician, Psychoeducation, Recreational therapy.   Physician Treatment Plan for Primary Diagnosis:  Alcohol use disorder Long Term Goal(s): Improvement in symptoms so as ready for discharge   Short Term Goals: Ability to identify changes in lifestyle to reduce recurrence of condition will improve Ability to verbalize feelings will improve Ability to demonstrate self-control will improve Ability to identify and develop effective coping behaviors will improve  Medication Management: Evaluate patient's response, side effects, and tolerance of medication regimen.  Therapeutic Interventions: 1 to 1 sessions, Unit Group sessions and Medication administration.  Evaluation of Outcomes: Progressing  Physician Treatment Plan for Secondary Diagnosis: Principal Problem:   Alcohol use disorder Active Problems:   Chest pain   Major depressive disorder, recurrent severe without psychotic features (HCC)  Long Term Goal(s):  Improvement in symptoms so as ready for discharge   Short Term Goals: Ability to identify changes in lifestyle to reduce recurrence of condition will improve Ability to verbalize feelings will improve Ability to demonstrate self-control will improve Ability to identify and develop effective coping behaviors will improve     Medication Management: Evaluate patient's response, side effects, and tolerance of medication regimen.  Therapeutic Interventions: 1 to 1 sessions, Unit Group sessions and Medication administration.  Evaluation of Outcomes: Progressing   RN Treatment Plan for Primary Diagnosis: Alcohol use disorder Long Term Goal(s): Knowledge of disease and therapeutic regimen to maintain health will improve  Short Term Goals: Ability to remain free from injury will improve, Ability to verbalize frustration and anger appropriately will improve, Ability to demonstrate self-control, Ability to participate in decision making will improve, Ability to verbalize feelings will improve, Ability to disclose and discuss suicidal ideas, Ability to identify and develop effective coping behaviors will improve, and Compliance with prescribed medications will improve  Medication Management: RN will administer medications as ordered by provider, will assess and evaluate patient's response and provide education to patient for prescribed medication. RN will report any adverse and/or side effects to prescribing provider.  Therapeutic Interventions: 1 on 1 counseling sessions, Psychoeducation, Medication administration, Evaluate responses to treatment, Monitor vital signs and CBGs as ordered, Perform/monitor CIWA, COWS, AIMS and Fall Risk screenings as ordered, Perform wound care treatments as ordered.  Evaluation of Outcomes: Progressing   LCSW Treatment Plan for Primary Diagnosis: Alcohol use disorder Long Term Goal(s): Safe transition to appropriate next level of care at discharge, Engage patient in  therapeutic group addressing interpersonal concerns.  Short Term Goals: Engage patient in aftercare planning with referrals and resources, Increase social support, Increase ability to appropriately verbalize feelings, Increase emotional regulation, Facilitate acceptance of mental health diagnosis and concerns, Facilitate patient progression through stages of change regarding substance use diagnoses and concerns, Identify triggers associated with mental health/substance abuse issues, and Increase skills for wellness and recovery  Therapeutic Interventions: Assess for all discharge needs, 1 to 1 time with Social worker, Explore available resources and support systems, Assess for adequacy in community support network, Educate family and significant other(s) on suicide prevention, Complete Psychosocial Assessment, Interpersonal group therapy.  Evaluation of Outcomes: Progressing   Progress in Treatment: Attending groups: Yes. Participating in groups: Yes. Taking medication as prescribed: Yes. Toleration medication: Yes. Family/Significant other contact made: No, will contact:  once CSW makes contact Patient understands diagnosis: Yes. Discussing patient identified problems/goals with staff: Yes. Medical problems stabilized or resolved: Yes. Denies suicidal/homicidal ideation: Yes. Issues/concerns per patient self-inventory: No. Other: None   New problem(s) identified: No, Describe:  none identified.    New Short Term/Long Term Goal(s):  detox; elimination of symptoms of psychosis, medication management for mood  stabilization; elimination of SI thoughts; development of comprehensive mental wellness/sobriety plan. Update 04/16/24: No changes at this time.   Patient Goals:  I'm just trying to not drink anymore.  Update 04/16/24: No changes at this time.   Discharge Plan or Barriers: CSW will assist pt with development of an appropriate aftercare/discharge plan.   Update 04/16/24: No changes at  this time.   Reason for Continuation of Hospitalization: Medication stabilization Suicidal ideation Withdrawal symptoms   Estimated Length of Stay: 1-7 days  Update 04/16/24: TBD  Last 3 [place] Suicide Severity Risk Score: Flowsheet Row Admission (Current) from 04/09/2024 in [facility] ED from 04/08/2024 in [facility] Emergency Department at [facility] ED from 04/06/2024 in [facility] Emergency Department at [facility]  C-SSRS RISK CATEGORY No Risk High Risk No Risk    Last PHQ 2/9 Scores:     No data to display          Scribe for Treatment Team: Roselyn GORMAN Lento, LCSW 04/16/2024 3:03 PM

## 2024-04-17 DIAGNOSIS — R079 Chest pain, unspecified: Secondary | ICD-10-CM | POA: Diagnosis not present

## 2024-04-17 DIAGNOSIS — F1090 Alcohol use, unspecified, uncomplicated: Secondary | ICD-10-CM | POA: Diagnosis not present

## 2024-04-17 DIAGNOSIS — F332 Major depressive disorder, recurrent severe without psychotic features: Secondary | ICD-10-CM | POA: Diagnosis not present

## 2024-04-17 LAB — HEPATITIS PANEL, ACUTE
HCV Ab: NONREACTIVE
Hep A IgM: NONREACTIVE
Hep B C IgM: NONREACTIVE
Hepatitis B Surface Ag: NONREACTIVE

## 2024-04-17 LAB — HIV ANTIBODY (ROUTINE TESTING W REFLEX): HIV Screen 4th Generation wRfx: NONREACTIVE

## 2024-04-17 MED ORDER — TRAZODONE HCL 100 MG PO TABS
100.0000 mg | ORAL_TABLET | Freq: Every evening | ORAL | Status: DC | PRN
  Administered 2024-04-17 – 2024-04-18 (×2): 100 mg via ORAL
  Filled 2024-04-17 (×2): qty 1

## 2024-04-17 NOTE — Progress Notes (Signed)
   04/17/24 0836  Psychosocial Assessment  Patient Complaints None  Eye Contact Fair  Facial Expression Other (Comment) (WNL)  Affect Apprehensive  Speech Logical/coherent  Interaction Assertive  Motor Activity Other (Comment) (WNL)  Appearance/Hygiene Unremarkable  Behavior Characteristics Cooperative  Mood Pleasant  Thought Process  Coherency WDL  Content WDL  Delusions None reported or observed  Perception WDL  Hallucination None reported or observed  Judgment WDL  Confusion None  Danger to Self  Current suicidal ideation? Denies  Self-Injurious Behavior No self-injurious ideation or behavior indicators observed or expressed   Agreement Not to Harm Self Yes  Description of Agreement verbal  Danger to Others  Danger to Others None reported or observed

## 2024-04-17 NOTE — Plan of Care (Signed)
 Christian Dickerson is a 38 y.o. male patient. No diagnosis found. Past Medical History:  Diagnosis Date   Alcohol abuse    Diverticulitis    Current Facility-Administered Medications  Medication Dose Route Frequency Provider Last Rate Last Admin   acetaminophen  (TYLENOL ) tablet 650 mg  650 mg Oral Q6H PRN Hampton, Tracie B, NP   650 mg at 04/12/24 0815   alum & mag hydroxide-simeth (MAALOX/MYLANTA) 200-200-20 MG/5ML suspension 30 mL  30 mL Oral Q4H PRN Hampton, Tracie B, NP       amLODipine  (NORVASC ) tablet 5 mg  5 mg Oral Daily Hampton, Tracie B, NP   5 mg at 04/16/24 9177   haloperidol (HALDOL) tablet 5 mg  5 mg Oral TID PRN Hampton, Tracie B, NP   5 mg at 04/16/24 2145   And   diphenhydrAMINE  (BENADRYL ) capsule 50 mg  50 mg Oral TID PRN Hampton, Tracie B, NP   50 mg at 04/16/24 2144   haloperidol lactate (HALDOL) injection 5 mg  5 mg Intramuscular TID PRN Hampton, Tracie B, NP       And   diphenhydrAMINE  (BENADRYL ) injection 50 mg  50 mg Intramuscular TID PRN Hampton, Tracie B, NP       And   LORazepam  (ATIVAN ) injection 2 mg  2 mg Intramuscular TID PRN Hampton, Tracie B, NP       feeding supplement (ENSURE PLUS HIGH PROTEIN) liquid 237 mL  237 mL Oral BID BM Jadapalle, Sree, MD   237 mL at 04/16/24 1500   folic acid  (FOLVITE ) tablet 1 mg  1 mg Oral Daily Jadapalle, Sree, MD   1 mg at 04/16/24 9177   hydrOXYzine (ATARAX) tablet 25 mg  25 mg Oral TID PRN Hunter, Crystal L, PA-C   25 mg at 04/15/24 1234   magnesium hydroxide (MILK OF MAGNESIA) suspension 30 mL  30 mL Oral Daily PRN Hampton, Tracie B, NP       multivitamin with minerals tablet 1 tablet  1 tablet Oral Daily Jadapalle, Sree, MD   1 tablet at 04/16/24 9177   nicotine  (NICODERM CQ  - dosed in mg/24 hours) patch 21 mg  21 mg Transdermal Daily Hampton, Tracie B, NP   21 mg at 04/15/24 9177   traZODone (DESYREL) tablet 50 mg  50 mg Oral QHS PRN Hampton, Tracie B, NP   50 mg at 04/16/24 2144   Allergies  Allergen Reactions   Latex     Principal Problem:   Alcohol use disorder Active Problems:   Chest pain   Major depressive disorder, recurrent severe without psychotic features (HCC)  Blood pressure 129/88, pulse 77, temperature 98.2 F (36.8 C), resp. rate 19, height 6' 1 (1.854 m), weight 108 kg, SpO2 100%.  Subjective Objective Assessment & Plan  Rue Valladares B Oluwanifemi Petitti 04/17/2024

## 2024-04-17 NOTE — Plan of Care (Signed)
   Problem: Education: Goal: Emotional status will improve Outcome: Progressing Goal: Mental status will improve Outcome: Progressing

## 2024-04-17 NOTE — Progress Notes (Signed)
 [facility] MD Progress Note  04/17/2024 10:26 AM Christian Dickerson  MRN:  [id number]   Subjective:  Chart reviewed, case discussed in multidisciplinary meeting, patient seen during rounds.   10/12: Patient seen today for follow-up. Psychiatric medications were discontinued yesterday per their request to go to [facility]. They received haldol, benadryl , and trazodone over night for what they report is difficulty sleeping. Increased PRN trazodone and discussed they would not have access to medications at the treatment facility. They report they are uncertain if they want to go to the facility or go to the shelter. They report they are feeling better since being here. Depression and anxiety are greatly improved. Depression 0/10 and anxiety 3/10. They note stable appetite. No concerns or complaints at this time. Encouraged to continue to participate in the unit and continue to develop coping skills. Discussed crisis resources and patient's support system.   10/11: Patient seen today for follow-up they are alert and oriented.  They are pleasant and cooperative on exam.  They continue to see placement at sober living facilities and reportedly have been accepted to the [facility] in [place] for Tuesday.  Lab work was ordered yesterday by the provider lab was not, nursing was instructed to reach out to them.  They deny SI, HI, and AVH.  They indicate they are performing their ADLs.  They report stable mood appetite and sleep. Concern they notice ongoing night sweats.  Discussed that the facility they would like to go to does not allow for psychotropic medications indicated will stop the Prozac, abilify, and gabapentin, discussed potential discontinuation sxs.  10/10: On interview today, patient is noted to be pleasant and cooperative.  He reports having a productive day as he continues to work on finding housing.  Patient reports he is motivated to maintain sobriety.  He rates depression at 0 out of 10 today  and anxiety as 2 out of 10 today.  He denies SI/HI/plan and denies hallucinations.  Per social work team he requires testing for HIV, TB, and hepatitis in order to be accepted at a certain residential placement.  Patient reports tolerating current medication regimen well without adverse effects.  He does not voice any concerns or complaints today.  10/9: On interview today, patient is noted to be sitting in his room.  He is calm and cooperative, alert and oriented.  He reports night sweats continue to improve.  He denies other symptoms of alcohol withdrawal.  He reports anxiety is currently 10 out of 10 due to the difficult process involved in finding housing.  Despite this, he reports making progress in finding placement upon discharge. He rates depression as 0/10 today.  He denies SI/HI/plan and denies hallucinations.  Prior to admission, patient was undergoing antibiotic therapy for diverticulitis, which was continued on admission.  He has completed course of ciprofloxacin  and will complete course of metronidazole  on 04/15/2024.  10/8: On interview today, patient is noted to be sitting in his room.  He is calm and cooperative initially, but becomes visibly frustrated when discussing barriers to finding housing.  Patient is currently unhoused and is facing legal charges for DUI.  He reports depression is a 7-8 out of 10 today. He reports situational anxiety regarding pending legal charges and obtaining housing.  Patient reports an episode of increased anxiety and agitation that occurred last night, during which he reported racing heart, chest tightness, and shortness of breath, consistent with panic attack.  He received as needed medication for this, which  she states was effective and well-tolerated.  He states during this episode he became upset and felt like he wants to punch a couple of people, but he did not feel compelled to act on this.  He denies current SI/HI/plan and denies hallucinations.  He  reports his chest pain is controlled with lidocaine  patch and Tylenol  per hospitalist recommendation.  Patient reports tolerating current medication regimen well without adverse effects.   10/7: On interview today, patient is found interacting in the milieu.  He is calm and cooperative, alert and oriented.  He reports persistent symptoms of depression anxiety, though does note some improvement, which he attributes to psychotropic medications recently started, Abilify and Prozac.  He is tolerating current medication regimen well without adverse effects.  He is agreeable to Prozac dose increased at this time.  He noted some night sweats and tremors today, tremors have since resolved.  He denies SI/HI/plan and denies hallucinations.  He reports chest pain, which was determined to be muscular in origin by hospitalist, is controlled with lidocaine  patch and Tylenol .  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  He is counseled by provider that compression stocking will not be allowed on unit at this time due to potential ligature risk.  Patient verbalizes understanding.  10/6: On interview today, patient is found seated in his room.  He is calm and cooperative and engaged in interview.  He reports depression as a 0 out of 10, and anxiety as a 10 out of 10, stating his upcoming court date and attempts to get in touch with a public defender has contributed to his anxiety.  He reports tolerating current medication regimen well without adverse effects.  He denies SI/HI/plan and denies hallucinations.  He reports night sweat and nausea; on CIWA protocol. Patient seen by dietician for nutritional assessment and management.  Patient was evaluated by hospitalist for chest pain, determined to be muscular in origin, no treatment needed, hospitalist has signed off.    Sleep: Good  Appetite:  Good  Past Psychiatric History: see h&P Family History: History reviewed. No  pertinent family history. Social History:  Social History   Substance and Sexual Activity  Alcohol Use Yes   Alcohol/week: 35.0 standard drinks of alcohol   Types: 35 Shots of liquor per week   Comment: 2 fifths a day     Social History   Substance and Sexual Activity  Drug Use Yes   Types: Marijuana    Social History   Socioeconomic History   Marital status: Single    Spouse name: Not on file   Number of children: Not on file   Years of education: Not on file   Highest education level: Not on file  Occupational History   Not on file  Tobacco Use   Smoking status: Every Day    Current packs/day: 1.00    Average packs/day: 1 pack/day for 1.1 years (1.1 ttl pk-yrs)    Types: Cigarettes    Start date: 03/08/2023    Passive exposure: Current   Smokeless tobacco: Never  Substance and Sexual Activity   Alcohol use: Yes    Alcohol/week: 35.0 standard drinks of alcohol    Types: 35 Shots of liquor per week    Comment: 2 fifths a day   Drug use: Yes    Types: Marijuana   Sexual activity: Not Currently  Other Topics Concern   Not on file  Social History Narrative  Not on file   Social Drivers of Health   Financial Resource Strain: Not on file  Food Insecurity: No Food Insecurity (04/09/2024)   Hunger Vital Sign    Worried About Running Out of Food in the Last Year: Never true    Ran Out of Food in the Last Year: Never true  Recent Concern: Food Insecurity - Food Insecurity Present (03/31/2024)   Hunger Vital Sign    Worried About Running Out of Food in the Last Year: Sometimes true    Ran Out of Food in the Last Year: Never true  Transportation Needs: Unmet Transportation Needs (04/09/2024)   PRAPARE - Administrator, Civil Service (Medical): Yes    Lack of Transportation (Non-Medical): Yes  Physical Activity: Not on file  Stress: Not on file  Social Connections: Socially Isolated (03/31/2024)   Social Connection and Isolation Panel    Frequency of  Communication with Friends and Family: Once a week    Frequency of Social Gatherings with Friends and Family: Never    Attends Religious Services: Never    Diplomatic Services operational officer: No    Attends Engineer, structural: Never    Marital Status: Never married   Past Medical History:  Past Medical History:  Diagnosis Date   Alcohol abuse    Diverticulitis     Past Surgical History:  Procedure Laterality Date   ANKLE SURGERY     CHEST TUBE INSERTION     LITHOTRIPSY     TIBIA FRACTURE SURGERY     TONSILLECTOMY AND ADENOIDECTOMY      Current Medications: Current Facility-Administered Medications  Medication Dose Route Frequency Provider Last Rate Last Admin   acetaminophen  (TYLENOL ) tablet 650 mg  650 mg Oral Q6H PRN Hampton, Tracie B, NP   650 mg at 04/12/24 0815   alum & mag hydroxide-simeth (MAALOX/MYLANTA) 200-200-20 MG/5ML suspension 30 mL  30 mL Oral Q4H PRN Hampton, Tracie B, NP       amLODipine  (NORVASC ) tablet 5 mg  5 mg Oral Daily Hampton, Tracie B, NP   5 mg at 04/17/24 0836   haloperidol (HALDOL) tablet 5 mg  5 mg Oral TID PRN Hampton, Tracie B, NP   5 mg at 04/16/24 2145   And   diphenhydrAMINE  (BENADRYL ) capsule 50 mg  50 mg Oral TID PRN Hampton, Tracie B, NP   50 mg at 04/16/24 2144   haloperidol lactate (HALDOL) injection 5 mg  5 mg Intramuscular TID PRN Hampton, Tracie B, NP       And   diphenhydrAMINE  (BENADRYL ) injection 50 mg  50 mg Intramuscular TID PRN Hampton, Tracie B, NP       And   LORazepam  (ATIVAN ) injection 2 mg  2 mg Intramuscular TID PRN Hampton, Tracie B, NP       feeding supplement (ENSURE PLUS HIGH PROTEIN) liquid 237 mL  237 mL Oral BID BM Jadapalle, Sree, MD   237 mL at 04/16/24 1500   folic acid  (FOLVITE ) tablet 1 mg  1 mg Oral Daily Jadapalle, Sree, MD   1 mg at 04/17/24 0836   hydrOXYzine (ATARAX) tablet 25 mg  25 mg Oral TID PRN Hunter, Crystal L, PA-C   25 mg at 04/15/24 1234   magnesium hydroxide (MILK OF MAGNESIA)  suspension 30 mL  30 mL Oral Daily PRN Hampton, Tracie B, NP       multivitamin with minerals tablet 1 tablet  1 tablet Oral Daily Jadapalle, Sree,  MD   1 tablet at 04/17/24 0836   nicotine  (NICODERM CQ  - dosed in mg/24 hours) patch 21 mg  21 mg Transdermal Daily Hampton, Tracie B, NP   21 mg at 04/17/24 0837   traZODone (DESYREL) tablet 100 mg  100 mg Oral QHS PRN Nyree Yonker E, PA-C        Lab Results:  No results found for this or any previous visit (from the past 48 hours).   Blood Alcohol level:  Lab Results  Component Value Date   ETH 354 (HH) 04/08/2024   ETH 309 (HH) 04/06/2024    Metabolic Disorder Labs: No results found for: HGBA1C, MPG No results found for: PROLACTIN No results found for: CHOL, TRIG, HDL, CHOLHDL, VLDL, LDLCALC  Physical Findings: AIMS:  , ,  ,  ,    CIWA:  CIWA-Ar Total: 0 COWS:      Psychiatric Specialty Exam:  Presentation  General Appearance:  Appropriate for Environment  Eye Contact: Good  Speech: Clear and Coherent  Speech Volume: Normal    Mood and Affect  Mood: Anxious  Affect: Congruent   Thought Process  Thought Processes: Linear  Descriptions of Associations:Intact  Orientation:Full (Time, Place and Person)  Thought Content:WDL  Hallucinations: None  Ideas of Reference:None  Suicidal Thoughts: No  Homicidal Thoughts: No    Sensorium  Memory: Immediate Fair; Recent Fair; Remote Fair  Judgment: Fair  Insight: Fair   Art therapist  Concentration: Fair  Attention Span: Fair  Recall: [employer] of Knowledge: Fair  Language: Fair   Psychomotor Activity  Psychomotor Activity: Normal  Musculoskeletal: Strength & Muscle Tone: within normal limits Gait & Station: normal Assets  Assets: Manufacturing systems engineer; Desire for Improvement    Physical Exam: Physical Exam Vitals and nursing note reviewed.  HENT:     Head: Atraumatic.  Eyes:      Extraocular Movements: Extraocular movements intact.  Pulmonary:     Effort: Pulmonary effort is normal.  Neurological:     Mental Status: He is alert and oriented to person, place, and time.    Review of Systems  Psychiatric/Behavioral:  Negative for depression, hallucinations, substance abuse and suicidal ideas. The patient is nervous/anxious and has insomnia.    Blood pressure 129/88, pulse 77, temperature 98.2 F (36.8 C), resp. rate 19, height 6' 1 (1.854 m), weight 108 kg, SpO2 100%. Body mass index is 31.4 kg/m.  Diagnosis: Principal Problem:   Alcohol use disorder Active Problems:   Chest pain   Major depressive disorder, recurrent severe without psychotic features (HCC)   PLAN: Safety and Monitoring:  -- Voluntary admission to inpatient psychiatric unit for safety, stabilization and treatment  -- Daily contact with patient to assess and evaluate symptoms and progress in treatment  -- Patient's case to be discussed in multi-disciplinary team meeting  -- Observation Level : q15 minute checks  -- Vital signs:  q12 hours  -- Precautions: suicide, elopement, and assault -- Encouraged patient to participate in unit milieu and in scheduled group therapies   2. Psychiatric Diagnoses and Treatment:             MDD, recurrent, severe, rule out bipolar disorder             Alcohol use disorder             Anxiety disorder D/c gabapentin 600 mg 3 times daily D/c Abilify 5 mg nightly D/c Prozac 20 mg daily Patient wishes to avoid Zoloft due to previous  abuse of this medication CIWA protocol -- The risks/benefits/side-effects/alternatives to this medication were discussed in detail with the patient and time was given for questions. The patient consents to medication trial.                -- Metabolic profile and EKG monitoring obtained while on an atypical antipsychotic (BMI: Lipid Panel: HbgA1c: QTc:)              -- Encouraged patient to participate in unit milieu and in  scheduled group therapies    Hospital Course:     3. Medical Issues Being Addressed:  Patient was evaluated by hospitalist for chest pain, determined to be muscular in origin, no treatment needed, symptomatic care, hospitalist has signed off.  Antibiotic therapy continued for diverticulitis, patient has completed course of ciprofloxacin  and will complete course of metronidazole  04/15/2024.   4. Discharge Planning:           -- [facility] to [facility] however patient is considering shelter.   -- Social work and case management to assist with discharge planning and identification of hospital follow-up needs prior to discharge  -- Estimated LOS: 5-7 days  Donnice FORBES Right, PA-C 04/17/2024, 10:26 AM

## 2024-04-17 NOTE — Group Note (Signed)
 LCSW Group Therapy Note  Group Date: 04/17/2024 Start Time: 1300 End Time: 1400   Type of Therapy and Topic:  Group Therapy: Positive Affirmations  Participation Level:  Active   Description of Group:   This group addressed positive affirmation towards self and others.  Patients went around the room and identified two positive things about themselves and two positive things about a peer in the room.  Patients reflected on how it felt to share something positive with others, to identify positive things about themselves, and to hear positive things from others/ Patients were encouraged to have a daily reflection of positive characteristics or circumstances.   Therapeutic Goals: Patients will verbalize two of their positive qualities Patients will demonstrate empathy for others by stating two positive qualities about a peer in the group Patients will verbalize their feelings when voicing positive self affirmations and when voicing positive affirmations of others Patients will discuss the potential positive impact on their wellness/recovery of focusing on positive traits of self and others.  Summary of Patient Progress:  Patient actively engaged in the discussion and was able to identify positive affirmations about themselves as well as other group members. Patient demonstrated proficient insight into the subject matter, was respectful of peers, participated throughout the entire session.  Therapeutic Modalities:   Cognitive Behavioral Therapy Motivational Interviewing    Alveta CHRISTELLA Kerns, [place] 04/17/2024  2:58 PM

## 2024-04-18 DIAGNOSIS — F1090 Alcohol use, unspecified, uncomplicated: Secondary | ICD-10-CM | POA: Diagnosis not present

## 2024-04-18 DIAGNOSIS — F332 Major depressive disorder, recurrent severe without psychotic features: Secondary | ICD-10-CM | POA: Diagnosis not present

## 2024-04-18 DIAGNOSIS — R079 Chest pain, unspecified: Secondary | ICD-10-CM | POA: Diagnosis not present

## 2024-04-18 NOTE — Progress Notes (Addendum)
 [facility] MD Progress Note  04/18/2024 3:59 PM Christian Dickerson  MRN:  [id number]   Subjective:  Chart reviewed, case discussed in multidisciplinary meeting, patient seen during rounds.   10/13: On interview today, patient is calm and cooperative, alert and oriented.  He reports improved sleep with increased trazodone dose.  Prozac, Abilify, gabapentin were discontinued previously per patient request due to patient wanting to go to [facility] upon discharge.  [facility] also requested HIV, hepatitis, and TB tests, which have been completed.  He is also considering shelter as an option.  Patient denies discontinuation symptoms or other adverse effects, states he is tolerating current regimen well.  He denies current symptoms of depression or anxiety.  He denies SI/HI/plan and denies hallucinations.  He denies access to guns or other lethal means.  He continues to report stable appetite.  He is able to discuss social support, coping skills, and crisis resources. He does not voice any concerns or complaints at this time.   10/12: Patient seen today for follow-up. Psychiatric medications were discontinued yesterday per their request to go to [facility]. They received haldol, benadryl , and trazodone over night for what they report is difficulty sleeping. Increased PRN trazodone and discussed they would not have access to medications at the treatment facility. They report they are uncertain if they want to go to the facility or go to the shelter. They report they are feeling better since being here. Depression and anxiety are greatly improved. Depression 0/10 and anxiety 3/10. They note stable appetite. No concerns or complaints at this time. Encouraged to continue to participate in the unit and continue to develop coping skills. Discussed crisis resources and patient's support system.   10/11: Patient seen today for follow-up they are alert and oriented.  They are pleasant and cooperative on  exam.  They continue to see placement at sober living facilities and reportedly have been accepted to the [facility] in [place] for Tuesday.  Lab work was ordered yesterday by the provider lab was not, nursing was instructed to reach out to them.  They deny SI, HI, and AVH.  They indicate they are performing their ADLs.  They report stable mood appetite and sleep. Concern they notice ongoing night sweats.  Discussed that the facility they would like to go to does not allow for psychotropic medications indicated will stop the Prozac, abilify, and gabapentin, discussed potential discontinuation sxs.  10/10: On interview today, patient is noted to be pleasant and cooperative.  He reports having a productive day as he continues to work on finding housing.  Patient reports he is motivated to maintain sobriety.  He rates depression at 0 out of 10 today and anxiety as 2 out of 10 today.  He denies SI/HI/plan and denies hallucinations.  Per social work team he requires testing for HIV, TB, and hepatitis in order to be accepted at a certain residential placement.  Patient reports tolerating current medication regimen well without adverse effects.  He does not voice any concerns or complaints today.  10/9: On interview today, patient is noted to be sitting in his room.  He is calm and cooperative, alert and oriented.  He reports night sweats continue to improve.  He denies other symptoms of alcohol withdrawal.  He reports anxiety is currently 10 out of 10 due to the difficult process involved in finding housing.  Despite this, he reports making progress in finding placement upon discharge. He rates depression as 0/10 today.  He  denies SI/HI/plan and denies hallucinations.  Prior to admission, patient was undergoing antibiotic therapy for diverticulitis, which was continued on admission.  He has completed course of ciprofloxacin  and will complete course of metronidazole  on 04/15/2024.  10/8: On interview today,  patient is noted to be sitting in his room.  He is calm and cooperative initially, but becomes visibly frustrated when discussing barriers to finding housing.  Patient is currently unhoused and is facing legal charges for DUI.  He reports depression is a 7-8 out of 10 today. He reports situational anxiety regarding pending legal charges and obtaining housing.  Patient reports an episode of increased anxiety and agitation that occurred last night, during which he reported racing heart, chest tightness, and shortness of breath, consistent with panic attack.  He received as needed medication for this, which she states was effective and well-tolerated.  He states during this episode he became upset and felt like he wants to punch a couple of people, but he did not feel compelled to act on this.  He denies current SI/HI/plan and denies hallucinations.  He reports his chest pain is controlled with lidocaine  patch and Tylenol  per hospitalist recommendation.  Patient reports tolerating current medication regimen well without adverse effects.   10/7: On interview today, patient is found interacting in the milieu.  He is calm and cooperative, alert and oriented.  He reports persistent symptoms of depression anxiety, though does note some improvement, which he attributes to psychotropic medications recently started, Abilify and Prozac.  He is tolerating current medication regimen well without adverse effects.  He is agreeable to Prozac dose increased at this time.  He noted some night sweats and tremors today, tremors have since resolved.  He denies SI/HI/plan and denies hallucinations.  He reports chest pain, which was determined to be muscular in origin by hospitalist, is controlled with lidocaine  patch and Tylenol .  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  He is counseled by provider that compression stocking will not be allowed on unit at this time due to  potential ligature risk.  Patient verbalizes understanding.  10/6: On interview today, patient is found seated in his room.  He is calm and cooperative and engaged in interview.  He reports depression as a 0 out of 10, and anxiety as a 10 out of 10, stating his upcoming court date and attempts to get in touch with a public defender has contributed to his anxiety.  He reports tolerating current medication regimen well without adverse effects.  He denies SI/HI/plan and denies hallucinations.  He reports night sweat and nausea; on CIWA protocol. Patient seen by dietician for nutritional assessment and management.  Patient was evaluated by hospitalist for chest pain, determined to be muscular in origin, no treatment needed, hospitalist has signed off.    Sleep: Good  Appetite:  Good  Past Psychiatric History: see h&P Family History: History reviewed. No pertinent family history. Social History:  Social History   Substance and Sexual Activity  Alcohol Use Yes   Alcohol/week: 35.0 standard drinks of alcohol   Types: 35 Shots of liquor per week   Comment: 2 fifths a day     Social History   Substance and Sexual Activity  Drug Use Yes   Types: Marijuana    Social History   Socioeconomic History   Marital status: Single    Spouse name: Not on file   Number of children: Not on file  Years of education: Not on file   Highest education level: Not on file  Occupational History   Not on file  Tobacco Use   Smoking status: Every Day    Current packs/day: 1.00    Average packs/day: 1 pack/day for 1.1 years (1.1 ttl pk-yrs)    Types: Cigarettes    Start date: 03/08/2023    Passive exposure: Current   Smokeless tobacco: Never  Substance and Sexual Activity   Alcohol use: Yes    Alcohol/week: 35.0 standard drinks of alcohol    Types: 35 Shots of liquor per week    Comment: 2 fifths a day   Drug use: Yes    Types: Marijuana   Sexual activity: Not Currently  Other Topics Concern   Not  on file  Social History Narrative   Not on file   Social Drivers of Health   Financial Resource Strain: Not on file  Food Insecurity: No Food Insecurity (04/09/2024)   Hunger Vital Sign    Worried About Running Out of Food in the Last Year: Never true    Ran Out of Food in the Last Year: Never true  Recent Concern: Food Insecurity - Food Insecurity Present (03/31/2024)   Hunger Vital Sign    Worried About Running Out of Food in the Last Year: Sometimes true    Ran Out of Food in the Last Year: Never true  Transportation Needs: Unmet Transportation Needs (04/09/2024)   PRAPARE - Administrator, Civil Service (Medical): Yes    Lack of Transportation (Non-Medical): Yes  Physical Activity: Not on file  Stress: Not on file  Social Connections: Socially Isolated (03/31/2024)   Social Connection and Isolation Panel    Frequency of Communication with Friends and Family: Once a week    Frequency of Social Gatherings with Friends and Family: Never    Attends Religious Services: Never    Database administrator or Organizations: No    Attends Engineer, structural: Never    Marital Status: Never married   Past Medical History:  Past Medical History:  Diagnosis Date   Alcohol abuse    Diverticulitis     Past Surgical History:  Procedure Laterality Date   ANKLE SURGERY     CHEST TUBE INSERTION     LITHOTRIPSY     TIBIA FRACTURE SURGERY     TONSILLECTOMY AND ADENOIDECTOMY      Current Medications: Current Facility-Administered Medications  Medication Dose Route Frequency Provider Last Rate Last Admin   acetaminophen  (TYLENOL ) tablet 650 mg  650 mg Oral Q6H PRN Hampton, Tracie B, NP   650 mg at 04/18/24 0810   alum & mag hydroxide-simeth (MAALOX/MYLANTA) 200-200-20 MG/5ML suspension 30 mL  30 mL Oral Q4H PRN Hampton, Tracie B, NP       amLODipine  (NORVASC ) tablet 5 mg  5 mg Oral Daily Hampton, Tracie B, NP   5 mg at 04/18/24 9191   haloperidol (HALDOL) tablet 5 mg  5  mg Oral TID PRN Hampton, Tracie B, NP   5 mg at 04/16/24 2145   And   diphenhydrAMINE  (BENADRYL ) capsule 50 mg  50 mg Oral TID PRN Hampton, Tracie B, NP   50 mg at 04/16/24 2144   haloperidol lactate (HALDOL) injection 5 mg  5 mg Intramuscular TID PRN Hampton, Tracie B, NP       And   diphenhydrAMINE  (BENADRYL ) injection 50 mg  50 mg Intramuscular TID PRN Hampton, Tracie B, NP  And   LORazepam  (ATIVAN ) injection 2 mg  2 mg Intramuscular TID PRN Hampton, Tracie B, NP       feeding supplement (ENSURE PLUS HIGH PROTEIN) liquid 237 mL  237 mL Oral BID BM Jadapalle, Sree, MD   237 mL at 04/17/24 1333   folic acid  (FOLVITE ) tablet 1 mg  1 mg Oral Daily Jadapalle, Sree, MD   1 mg at 04/18/24 9191   hydrOXYzine (ATARAX) tablet 25 mg  25 mg Oral TID PRN Ainhoa Rallo L, PA-C   25 mg at 04/17/24 2145   magnesium hydroxide (MILK OF MAGNESIA) suspension 30 mL  30 mL Oral Daily PRN Hampton, Tracie B, NP       multivitamin with minerals tablet 1 tablet  1 tablet Oral Daily Jadapalle, Sree, MD   1 tablet at 04/18/24 9191   nicotine  (NICODERM CQ  - dosed in mg/24 hours) patch 21 mg  21 mg Transdermal Daily Hampton, Tracie B, NP   21 mg at 04/18/24 0809   traZODone (DESYREL) tablet 100 mg  100 mg Oral QHS PRN Millington, Matthew E, PA-C   100 mg at 04/17/24 2145    Lab Results:  Results for orders placed or performed during the hospital encounter of 04/09/24 (from the past 48 hours)  Hepatitis panel, acute     Status: None   Collection Time: 04/16/24  9:39 PM  Result Value Ref Range   Hepatitis B Surface Ag NON REACTIVE NON REACTIVE   HCV Ab NON REACTIVE NON REACTIVE    Comment: (NOTE) Nonreactive HCV antibody screen is consistent with no HCV infections,  unless recent infection is suspected or other evidence exists to indicate HCV infection.     Hep A IgM NON REACTIVE NON REACTIVE   Hep B C IgM NON REACTIVE NON REACTIVE    Comment: Performed at [facility], [address],  [place], [place] [address]  HIV Antibody (routine testing w rflx)     Status: None   Collection Time: 04/16/24  9:39 PM  Result Value Ref Range   HIV Screen 4th Generation wRfx Non Reactive Non Reactive    Comment: Performed at [facility], [address], [place], [place] [address]     Blood Alcohol level:  Lab Results  Component Value Date   ETH 354 [facility]) 04/08/2024   ETH 309 ([facility]) 04/06/2024    Metabolic Disorder Labs: No results found for: HGBA1C, MPG No results found for: PROLACTIN No results found for: CHOL, TRIG, HDL, CHOLHDL, VLDL, LDLCALC  Physical Findings: AIMS:  , ,  ,  ,    CIWA:  CIWA-Ar Total: 0 COWS:      Psychiatric Specialty Exam:  Presentation  General Appearance:  Appropriate for Environment  Eye Contact: Good  Speech: Clear and Coherent  Speech Volume: Normal    Mood and Affect  Mood: Euthymic  Affect: Congruent   Thought Process  Thought Processes: Linear  Descriptions of Associations:Intact  Orientation:Full (Time, Place and Person)  Thought Content:WDL  Hallucinations: None  Ideas of Reference:None  Suicidal Thoughts: No  Homicidal Thoughts: No    Sensorium  Memory: Immediate Fair; Recent Fair; Remote Fair  Judgment: Fair  Insight: Fair   Art therapist  Concentration: Fair  Attention Span: Fair  Recall: [employer] of Knowledge: Fair  Language: Fair   Psychomotor Activity  Psychomotor Activity: Normal  Musculoskeletal: Strength & Muscle Tone: within normal limits Gait & Station: normal Assets  Assets: Manufacturing systems engineer; Desire  for Improvement    Physical Exam: Physical Exam Vitals and nursing note reviewed.  HENT:     Head: Atraumatic.  Eyes:     Extraocular Movements: Extraocular movements intact.  Pulmonary:     Effort: Pulmonary effort is normal.  Neurological:     Mental Status: He is alert and oriented to person, place, and time.    Review  of Systems  Psychiatric/Behavioral:  Negative for depression, hallucinations, substance abuse and suicidal ideas. The patient is not nervous/anxious and does not have insomnia.    Blood pressure 123/78, pulse 87, temperature 97.9 F (36.6 C), resp. rate 16, height 6' 1 (1.854 m), weight 108 kg, SpO2 100%. Body mass index is 31.4 kg/m.  Diagnosis: Principal Problem:   Alcohol use disorder Active Problems:   Chest pain   Major depressive disorder, recurrent severe without psychotic features (HCC)   PLAN: Safety and Monitoring:  -- Voluntary admission to inpatient psychiatric unit for safety, stabilization and treatment  -- Daily contact with patient to assess and evaluate symptoms and progress in treatment  -- Patient's case to be discussed in multi-disciplinary team meeting  -- Observation Level : q15 minute checks  -- Vital signs:  q12 hours  -- Precautions: suicide, elopement, and assault -- Encouraged patient to participate in unit milieu and in scheduled group therapies   2. Psychiatric Diagnoses and Treatment:             MDD, recurrent, severe, rule out bipolar disorder             Alcohol use disorder             Anxiety disorder D/c gabapentin 600 mg 3 times daily D/c Abilify 5 mg nightly D/c Prozac 20 mg daily Patient wishes to avoid Zoloft due to previous abuse of this medication CIWA protocol -- The risks/benefits/side-effects/alternatives to this medication were discussed in detail with the patient and time was given for questions. The patient consents to medication trial.                -- Metabolic profile and EKG monitoring obtained while on an atypical antipsychotic (BMI: Lipid Panel: HbgA1c: QTc:)              -- Encouraged patient to participate in unit milieu and in scheduled group therapies    Hospital Course:     3. Medical Issues Being Addressed:  Patient was evaluated by hospitalist for chest pain, determined to be muscular in origin, no treatment  needed, symptomatic care, hospitalist has signed off.  Antibiotic therapy continued for diverticulitis, patient has completed courses of ciprofloxacin  and metronidazole .   4. Discharge Planning:           -- Tuesday to [facility] however patient is considering shelter.   -- Social work and case management to assist with discharge planning and identification of hospital follow-up needs prior to discharge  -- Estimated LOS: 7-10 days  Camelia LITTIE Lukes, PA-C 04/18/2024, 3:59 PM

## 2024-04-18 NOTE — Plan of Care (Signed)
  Problem: Education: Goal: Emotional status will improve Outcome: Progressing   Problem: Education: Goal: Mental status will improve Outcome: Progressing   Problem: Education: Goal: Verbalization of understanding the information provided will improve Outcome: Progressing   

## 2024-04-18 NOTE — Progress Notes (Signed)
   04/17/24 2000  Psych Admission Type (Psych Patients Only)  Admission Status Involuntary  Psychosocial Assessment  Patient Complaints Agitation;Anger  Eye Contact Fair  Facial Expression Angry  Affect Angry;Anxious;Irritable  Speech Logical/coherent  Interaction Assertive  Motor Activity Slow  Appearance/Hygiene Improved  Behavior Characteristics Cooperative;Agressive verbally  Mood Irritable  Thought Process  Coherency WDL  Content WDL  Delusions None reported or observed  Perception WDL  Hallucination None reported or observed  Judgment WDL  Confusion None  Danger to Self  Current suicidal ideation? Denies  Self-Injurious Behavior No self-injurious ideation or behavior indicators observed or expressed   Agreement Not to Harm Self Yes  Description of Agreement verbal  Danger to Others  Danger to Others None reported or observed   Patient alert and oriented x 4, affect is blunted he appears angry and irritable after writer informed her that he would not be getting Haldol and benadryl  together for sleep. Patient made verbal threats  I will kill him when l see him in the morning  patient was visibly upset, 15 minutes safety checks maintained will continue to monitor.

## 2024-04-18 NOTE — Plan of Care (Signed)
  Problem: Education: Goal: Emotional status will improve Outcome: Progressing Goal: Mental status will improve Outcome: Progressing   Problem: Activity: Goal: Interest or engagement in activities will improve Outcome: Progressing   Problem: Coping: Goal: Ability to verbalize frustrations and anger appropriately will improve Outcome: Progressing Goal: Ability to demonstrate self-control will improve Outcome: Progressing   Problem: Health Behavior/Discharge Planning: Goal: Compliance with treatment plan for underlying cause of condition will improve Outcome: Progressing   Problem: Physical Regulation: Goal: Ability to maintain clinical measurements within normal limits will improve Outcome: Progressing   Problem: Safety: Goal: Periods of time without injury will increase Outcome: Progressing

## 2024-04-18 NOTE — Progress Notes (Signed)
   04/18/24 1200  Psych Admission Type (Psych Patients Only)  Admission Status Voluntary (Patient signed voluntary on 04/18/24)  Psychosocial Assessment  Patient Complaints None  Eye Contact Fair  Facial Expression Animated  Affect Appropriate to circumstance  Speech Logical/coherent  Interaction Assertive  Motor Activity Other (Comment) (appropriate for developmental age)  Appearance/Hygiene Unremarkable  Behavior Characteristics Cooperative  Mood Pleasant  Thought Process  Coherency WDL  Content WDL  Delusions None reported or observed  Perception WDL  Hallucination None reported or observed  Judgment Poor  Confusion None  Danger to Self  Current suicidal ideation? Denies  Self-Injurious Behavior No self-injurious ideation or behavior indicators observed or expressed   Danger to Others  Danger to Others None reported or observed

## 2024-04-18 NOTE — Group Note (Signed)
 Date:  04/18/2024 Time:  10:02 AM  Group Topic/Focus:  Goals Group:   The focus of this group is to help patients establish daily goals to achieve during treatment and discuss how the patient can incorporate goal setting into their daily lives to aide in recovery.    Participation Level:  Active  Participation Quality:  Appropriate  Affect:  Appropriate  Cognitive:  Appropriate  Insight: Appropriate  Engagement in Group:  Engaged  Modes of Intervention:  Activity  Additional Comments:    Christian Dickerson Christian Dickerson 04/18/2024, 10:02 AM

## 2024-04-18 NOTE — Group Note (Signed)
 LCSW Group Therapy Note  Group Date: 04/18/2024 Start Time: 1315 End Time: 1400   Type of Therapy and Topic:  Group Therapy - How To Cope with Nervousness about Discharge   Participation Level:  Active   Description of Group This process group involved identification of patients' feelings about discharge. Some of them are scheduled to be discharged soon, while others are new admissions, but each of them was asked to share thoughts and feelings surrounding discharge from the hospital. One common theme was that they are excited at the prospect of going home, while another was that many of them are apprehensive about sharing why they were hospitalized. Patients were given the opportunity to discuss these feelings with their peers in preparation for discharge.  Therapeutic Goals  Patient will identify their overall feelings about pending discharge. Patient will think about how they might proactively address issues that they believe will once again arise once they get home (i.e. with parents). Patients will participate in discussion about having hope for change.   Summary of Patient Progress:   He was very active throughout the session. He demonstrated fair insight into the subject matter, and proved open to input from peers and feedback from CSW. He was respectful of peers and participated throughout the entire session.   Therapeutic Modalities Cognitive Behavioral Therapy   Christian JINNY Margo, LCSW 04/18/2024  3:02 PM

## 2024-04-18 NOTE — BHH Counselor (Signed)
 CSW spoke with Octaviano Breslow, [phone] with [facility].  CSW was able to confirm that pt has been accepted.   Pt to arrive between 12pm and 2p on 04/19/2024.  Address: [address] [place], [place] [address]   Sherryle Margo, MSW, LCSW 04/18/2024 3:17 PM

## 2024-04-18 NOTE — Group Note (Signed)
 Date:  04/18/2024 Time:  8:58 PM  Group Topic/Focus:  Recovery Goals:   The focus of this group is to identify appropriate goals for recovery and establish a plan to achieve them.    Participation Level:  Active  Participation Quality:  Appropriate, Attentive, Sharing, and Supportive  Affect:  Appropriate  Cognitive:  Alert and Appropriate  Insight: Appropriate, Good, and Improving  Engagement in Group:  Developing/Improving, Engaged, and Supportive  Modes of Intervention:  Clarification, Discussion, Education, Problem-solving, Rapport Building, Dance movement psychotherapist, and Support  Additional Comments:     Christian Dickerson 04/18/2024, 8:58 PM

## 2024-04-18 NOTE — Group Note (Signed)
 Date:  04/18/2024 Time:  6:40 PM  Group Topic/Focus:  Activity Group :  The focus of the group is to promote activity for the patients and encourage them to go outside    Participation Level:  Active  Participation Quality:  Appropriate  Affect:  Appropriate  Cognitive:  Appropriate  Insight: Appropriate  Engagement in Group:  Engaged  Modes of Intervention:  Activity  Additional Comments:    Christian Dickerson Morley Gaumer 04/18/2024, 6:40 PM

## 2024-04-18 NOTE — Group Note (Signed)
 Recreation Therapy Group Note   Group Topic:Healthy Support Systems  Group Date: 04/18/2024 Start Time: 1015 End Time: 1100 Facilitators: Celestia Jeoffrey BRAVO, LRT, CTRS Location: Craft Room  Group Description: Straw Bridge. In groups or individually, patients were given 10 plastic drinking straws and an equal length of masking tape. Using the materials provided, patients were instructed to build a free-standing bridge-like structure to suspend an everyday item (ex: deck of cards) off the floor or table surface. All materials were required to be used in Secondary school teacher. LRT facilitated post-activity discussion reviewing the importance of having strong and healthy support systems in our lives. LRT discussed how the people in our lives serve as the tape and the deck of cards we placed on top of our straw structure are the stressors we face in daily life. LRT and pts discussed what happens in our life when things get too heavy for us , and we don't have strong supports outside of the hospital. Pt shared 2 of their healthy supports in their life aloud in the group.   Goal Area(s) Addressed:  Patient will identify 2 healthy supports in their life. Patient will identify skills to successfully complete activity. Patient will identify correlation of this activity to life post-discharge.  Patient will build on frustration tolerance skills. Patient will increase team building and communication skills.    Affect/Mood: Appropriate   Participation Level: Active and Engaged   Participation Quality: Independent   Behavior: Alert and Appropriate   Speech/Thought Process: Coherent   Insight: Good   Judgement: Good   Modes of Intervention: STEM Activity   Patient Response to Interventions:  Attentive, Engaged, Interested , and Receptive   Education Outcome:  Acknowledges education   Clinical Observations/Individualized Feedback: Gergory was active in their participation of session activities and group  discussion. Pt identified my brother and sister as supports.    Plan: Continue to engage patient in RT group sessions 2-3x/week.   Jeoffrey BRAVO Celestia, LRT, CTRS 04/18/2024 1:12 PM

## 2024-04-18 NOTE — Group Note (Signed)
 Date:  04/18/2024 Time:  2:17 AM  Group Topic/Focus:  Managing Feelings:   The focus of this group is to identify what feelings patients have difficulty handling and develop a plan to handle them in a healthier way upon discharge. Self Care:   The focus of this group is to help patients understand the importance of self-care in order to improve or restore emotional, physical, spiritual, interpersonal, and financial health.    Participation Level:  Active  Participation Quality:  Appropriate and Attentive  Affect:  Appropriate  Cognitive:  Alert, Appropriate, and Oriented  Insight: Appropriate and Good  Engagement in Group:  Engaged  Modes of Intervention:  Discussion and Support  Additional Comments:  N/A  Butler LITTIE Gelineau 04/18/2024, 2:17 AM

## 2024-04-18 NOTE — Plan of Care (Signed)
   Problem: Education: Goal: Emotional status will improve Outcome: Progressing   Problem: Coping: Goal: Ability to verbalize frustrations and anger appropriately will improve Outcome: Progressing

## 2024-04-18 NOTE — Progress Notes (Signed)
   04/18/24 1930  Psych Admission Type (Psych Patients Only)  Admission Status Voluntary  Psychosocial Assessment  Patient Complaints None  Eye Contact Fair  Facial Expression Other (Comment) (WNL)  Affect Appropriate to circumstance  Speech Logical/coherent  Interaction Assertive  Motor Activity Other (Comment) (WNL)  Appearance/Hygiene Unremarkable  Behavior Characteristics Cooperative  Mood Pleasant  Thought Process  Coherency WDL  Content WDL  Delusions None reported or observed  Perception WDL  Hallucination None reported or observed  Judgment WDL  Confusion None  Danger to Self  Current suicidal ideation? Denies  Self-Injurious Behavior No self-injurious ideation or behavior indicators observed or expressed   Agreement Not to Harm Self Yes  Description of Agreement verbal  Danger to Others  Danger to Others None reported or observed

## 2024-04-19 ENCOUNTER — Other Ambulatory Visit: Payer: Self-pay

## 2024-04-19 DIAGNOSIS — F1090 Alcohol use, unspecified, uncomplicated: Secondary | ICD-10-CM | POA: Diagnosis not present

## 2024-04-19 DIAGNOSIS — F332 Major depressive disorder, recurrent severe without psychotic features: Secondary | ICD-10-CM | POA: Diagnosis not present

## 2024-04-19 DIAGNOSIS — R079 Chest pain, unspecified: Secondary | ICD-10-CM | POA: Diagnosis not present

## 2024-04-19 LAB — QUANTIFERON-TB GOLD PLUS: QuantiFERON-TB Gold Plus: NEGATIVE

## 2024-04-19 LAB — QUANTIFERON-TB GOLD PLUS (RQFGPL)
QuantiFERON Mitogen Value: >10 [IU]/mL
QuantiFERON Nil Value: 0.04 [IU]/mL
QuantiFERON TB1 Ag Value: 0.04 [IU]/mL
QuantiFERON TB2 Ag Value: 0.03 [IU]/mL

## 2024-04-19 MED ORDER — ADULT MULTIVITAMIN W/MINERALS CH
1.0000 | ORAL_TABLET | Freq: Every day | ORAL | 0 refills | Status: DC
  Filled 2024-04-19: qty 30, 30d supply, fill #0

## 2024-04-19 MED ORDER — FOLIC ACID 1 MG PO TABS
1.0000 mg | ORAL_TABLET | Freq: Every day | ORAL | 0 refills | Status: DC
  Filled 2024-04-19: qty 30, 30d supply, fill #0

## 2024-04-19 MED ORDER — AMLODIPINE BESYLATE 5 MG PO TABS
5.0000 mg | ORAL_TABLET | Freq: Every day | ORAL | 0 refills | Status: DC
  Filled 2024-04-19: qty 30, 30d supply, fill #0
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
## 2024-04-19 NOTE — Progress Notes (Signed)
 Patient denies SI/I/AVH at this time. Discharge instructions, AVS, prescriptions, and transition record reviewed with patient. Patient agrees to comply with medication management, follow-up visit and outpatient therapy. Patient belongings returned and prescribed medications sent with patient. Patient questions and concerns addressed and answered.  Patient ambulatory off unit. Patient discharged via Taxi to [facility].

## 2024-04-19 NOTE — Plan of Care (Signed)
   Problem: Education: Goal: Knowledge of [facility] Education information/materials will improve Outcome: Adequate for Discharge Goal: Emotional status will improve Outcome: Adequate for Discharge Goal: Mental status will improve Outcome: Adequate for Discharge Goal: Verbalization of understanding the information provided will improve Outcome: Adequate for Discharge   Problem: Activity: Goal: Interest or engagement in activities will improve Outcome: Adequate for Discharge Goal: Sleeping patterns will improve Outcome: Adequate for Discharge   Problem: Coping: Goal: Ability to verbalize frustrations and anger appropriately will improve Outcome: Adequate for Discharge Goal: Ability to demonstrate self-control will improve Outcome: Adequate for Discharge   Problem: Health Behavior/Discharge Planning: Goal: Identification of resources available to assist in meeting health care needs will improve Outcome: Adequate for Discharge Goal: Compliance with treatment plan for underlying cause of condition will improve Outcome: Adequate for Discharge   Problem: Physical Regulation: Goal: Ability to maintain clinical measurements within normal limits will improve Outcome: Adequate for Discharge   Problem: Safety: Goal: Periods of time without injury will increase Outcome: Adequate for Discharge   Problem: Education: Goal: Utilization of techniques to improve thought processes will improve Outcome: Adequate for Discharge Goal: Knowledge of the prescribed therapeutic regimen will improve Outcome: Adequate for Discharge   Problem: Activity: Goal: Interest or engagement in leisure activities will improve Outcome: Adequate for Discharge Goal: Imbalance in normal sleep/wake cycle will improve Outcome: Adequate for Discharge   Problem: Coping: Goal: Coping ability will improve Outcome: Adequate for Discharge Goal: Will verbalize feelings Outcome: Adequate for Discharge    Problem: Health Behavior/Discharge Planning: Goal: Ability to make decisions will improve Outcome: Adequate for Discharge Goal: Compliance with therapeutic regimen will improve Outcome: Adequate for Discharge   Problem: Role Relationship: Goal: Will demonstrate positive changes in social behaviors and relationships Outcome: Adequate for Discharge   Problem: Safety: Goal: Ability to disclose and discuss suicidal ideas will improve Outcome: Adequate for Discharge Goal: Ability to identify and utilize support systems that promote safety will improve Outcome: Adequate for Discharge   Problem: Self-Concept: Goal: Will verbalize positive feelings about self Outcome: Adequate for Discharge Goal: Level of anxiety will decrease Outcome: Adequate for Discharge
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
## 2024-04-19 NOTE — Progress Notes (Signed)
   04/19/24 1100  Psych Admission Type (Psych Patients Only)  Admission Status Voluntary  Psychosocial Assessment  Patient Complaints None  Eye Contact Fair  Facial Expression Animated  Affect Appropriate to circumstance  Speech Logical/coherent  Interaction Assertive  Motor Activity Other (Comment) (appropriate for developmental age)  Appearance/Hygiene Unremarkable  Behavior Characteristics Cooperative  Mood Pleasant;Euthymic (Patient writes his goal is to stop drinking. He writes he will not drink to help meet that goal.)  Thought Process  Coherency WDL  Content WDL  Delusions None reported or observed  Perception WDL  Hallucination None reported or observed  Judgment Poor (improving)  Confusion None  Danger to Self  Current suicidal ideation? Denies  Self-Injurious Behavior No self-injurious ideation or behavior indicators observed or expressed   Danger to Others  Danger to Others None reported or observed

## 2024-04-19 NOTE — Group Note (Signed)
 Recreation Therapy Group Note   Group Topic:Health and Wellness  Group Date: 04/19/2024 Start Time: 1000 End Time: 1055 Facilitators: Celestia Jeoffrey BRAVO, LRT, CTRS Location: Courtyard  Group Description: [employer]. LRT and patients played games of basketball, drew with chalk, and played corn hole while outside in the courtyard while getting fresh air and sunlight. Music was being played in the background. LRT and peers conversed about different games they have played before, what they do in their free time and anything else that is on their minds. LRT encouraged pts to drink water after being outside, sweating and getting their heart rate up.  Goal Area(s) Addressed: Patient will build on frustration tolerance skills. Patients will partake in a competitive play game with peers. Patients will gain knowledge of new leisure interest/hobby.    Affect/Mood: Appropriate   Participation Level: Active and Engaged   Participation Quality: Independent   Behavior: Calm and Cooperative   Speech/Thought Process: Coherent   Insight: Fair   Judgement: Fair    Modes of Intervention: Exploration, Music, and Rapport Building   Patient Response to Interventions:  Interested  and Receptive   Education Outcome:  Acknowledges education   Clinical Observations/Individualized Feedback: Christian Dickerson was active in their participation of session activities and group discussion. Pt interacted well with LRT and peers duration of session.    Plan: Continue to engage patient in RT group sessions 2-3x/week.   Jeoffrey BRAVO Celestia, LRT, CTRS 04/19/2024 11:41 AM

## 2024-04-19 NOTE — Progress Notes (Signed)
  [facility] Adult Case Management Discharge Plan :  Will you be returning to the same living situation after discharge:  No.  Patient going to [employer].  At discharge, do you have transportation home?: Yes,  CSW to assist with transportation needs.  Do you have the ability to pay for your medications: Yes,  [place] MEDICAID PREPAID HEALTH PLAN / [place] MEDICAID [place] COMPLETE HEALTH  Release of information consent forms completed and in the chart;  Patient's signature needed at discharge.  Patient to Follow up at:  Follow-up Information     [facility], [facility] .   Why: Referral information has been sent.  Please follow up. Contact information: [address] [place] [address] [phone]         [facility] Follow up.   Why: Appointment is scheduled for 07/19/2024 at 1:30PM.  Please bring your photo ID, insurance card and any prescription meds in their original containers.  Please arrive 15 minutres early.  New patient paperwork will be emiled to you. Contact information: [address] [place], [place] [address]  Appointments [phone] Office [phone] Fax [phone]        [facility] Follow up.   Why: They offer Christian counselors on site. Contact information: Mailing Address: [address] [place], [place] [address] Office Phone: [phone] Fax: [phone]  Email: [email or web address] Alice's Email: [email or web address]        [facility] Follow up.   Why: Walk in hours are from 8AM to 4PM Monday through Friday Contact information: [address] [place] [address] [phone]                 Next level of care provider has access to [facility] Link:no  Safety Planning and Suicide Prevention discussed: Yes,  SPE completed with the patient. Collateral contact was not successful.     Has patient been referred to the Quitline?: Patient refused referral for  treatment  Patient has been referred for addiction treatment: Yes, the patient will follow up with an outpatient provider for substance use disorder. Therapist: patient to schedule appointment  Sherryle JINNY Margo, LCSW 04/19/2024, 9:16 AM

## 2024-04-19 NOTE — Group Note (Signed)
 Date:  04/19/2024 Time:  11:06 AM  Group Topic/Focus:  Goals Group:   The focus of this group is to help patients establish daily goals to achieve during treatment and discuss how the patient can incorporate goal setting into their daily lives to aide in recovery.    Participation Level:  Active  Participation Quality:  Appropriate  Affect:  Appropriate  Cognitive:  Alert  Insight: Appropriate  Engagement in Group:  Engaged  Modes of Intervention:  Activity, Discussion, and Education  Additional Comments:    Skippy LITTIE Bennett 04/19/2024, 11:06 AM

## 2024-04-21 ENCOUNTER — Telehealth: Payer: Self-pay

## 2024-04-21 DIAGNOSIS — K5720 Diverticulitis of large intestine with perforation and abscess without bleeding: Secondary | ICD-10-CM

## 2024-04-21 DIAGNOSIS — R45851 Suicidal ideations: Secondary | ICD-10-CM

## 2024-04-22 ENCOUNTER — Telehealth: Payer: Self-pay

## 2024-04-22 NOTE — Progress Notes (Unsigned)
 Complex Care Management Note Care Guide Note  04/22/2024 Name: Christian Dickerson MRN: [id number] DOB: 1986/04/09   Complex Care Management Outreach Attempts: An unsuccessful telephone outreach was attempted today to offer the patient information about available complex care management services.  Follow Up Plan:  Additional outreach attempts will be made to offer the patient complex care management information and services.   Encounter Outcome:  No Answer  Jeoffrey Buffalo , RMA     [place]  [facility], [facility] Guide  Direct Dial: [phone]  Website: [email or web address]

## 2024-04-28 NOTE — Progress Notes (Signed)
 Complex Care Management Note  Care Guide Note 04/28/2024 Name: Christian Dickerson MRN: [id number] DOB: 25-Aug-1985  Christian Dickerson is a 38 y.o. year old male who sees Pcp, No for primary care. I reached out to Fonda JONETTA Bare by phone today to offer complex care management services.  Mr. Kochan was given information about Complex Care Management services today including:   The Complex Care Management services include support from the care team which includes your Nurse Care Manager, Clinical Social Worker, or Pharmacist.  The Complex Care Management team is here to help remove barriers to the health concerns and goals most important to you. Complex Care Management services are voluntary, and the patient may decline or stop services at any time by request to their care team member.   Complex Care Management Consent Status: Patient did not agree to participate in complex care management services at this time.  Follow up plan:  Patient does not have a [place] PCP  Encounter Outcome:  Patient Refused  Jeoffrey Buffalo , RMA     [facility]  [facility], [facility] Guide  Direct Dial: [phone]  Website: [email or web address]

## 2024-04-29 ENCOUNTER — Emergency Department

## 2024-04-29 ENCOUNTER — Emergency Department (EMERGENCY_DEPARTMENT_HOSPITAL)
Admission: EM | Admit: 2024-04-29 | Discharge: 2024-04-30 | Disposition: A | Discharge status: Other Acute Inpatient Hospital | Source: Home / Self Care | Attending: Emergency Medicine | Admitting: Emergency Medicine

## 2024-04-29 ENCOUNTER — Other Ambulatory Visit: Payer: Self-pay

## 2024-04-29 DIAGNOSIS — F39 Unspecified mood [affective] disorder: Secondary | ICD-10-CM

## 2024-04-29 DIAGNOSIS — M25571 Pain in right ankle and joints of right foot: Secondary | ICD-10-CM | POA: Insufficient documentation

## 2024-04-29 DIAGNOSIS — F10230 Alcohol dependence with withdrawal, uncomplicated: Secondary | ICD-10-CM | POA: Diagnosis present

## 2024-04-29 DIAGNOSIS — F1020 Alcohol dependence, uncomplicated: Secondary | ICD-10-CM | POA: Insufficient documentation

## 2024-04-29 DIAGNOSIS — T50902A Poisoning by unspecified drugs, medicaments and biological substances, intentional self-harm, initial encounter: Secondary | ICD-10-CM

## 2024-04-29 DIAGNOSIS — R45851 Suicidal ideations: Secondary | ICD-10-CM

## 2024-04-29 DIAGNOSIS — T465X2A Poisoning by other antihypertensive drugs, intentional self-harm, initial encounter: Secondary | ICD-10-CM | POA: Insufficient documentation

## 2024-04-29 LAB — CBC WITH DIFFERENTIAL/PLATELET
Abs Immature Granulocytes: 0.05 K/uL (ref 0.00–0.07)
Basophils Absolute: 0.1 K/uL (ref 0.0–0.1)
Basophils Relative: 1 %
Eosinophils Absolute: 0.4 K/uL (ref 0.0–0.5)
Eosinophils Relative: 5 %
HCT: 38.9 % — ABNORMAL LOW (ref 39.0–52.0)
Hemoglobin: 13.2 g/dL (ref 13.0–17.0)
Immature Granulocytes: 1 %
Lymphocytes Relative: 35 %
Lymphs Abs: 3 K/uL (ref 0.7–4.0)
MCH: 34.9 pg — ABNORMAL HIGH (ref 26.0–34.0)
MCHC: 33.9 g/dL (ref 30.0–36.0)
MCV: 102.9 fL — ABNORMAL HIGH (ref 80.0–100.0)
Monocytes Absolute: 0.6 K/uL (ref 0.1–1.0)
Monocytes Relative: 7 %
Neutro Abs: 4.5 K/uL (ref 1.7–7.7)
Neutrophils Relative %: 51 %
Platelets: 211 K/uL (ref 150–400)
RBC: 3.78 MIL/uL — ABNORMAL LOW (ref 4.22–5.81)
RDW: 13.3 % (ref 11.5–15.5)
WBC: 8.6 K/uL (ref 4.0–10.5)
nRBC: 0 % (ref 0.0–0.2)

## 2024-04-29 LAB — COMPREHENSIVE METABOLIC PANEL WITH GFR
ALT: 32 U/L (ref 0–44)
AST: 31 U/L (ref 15–41)
Albumin: 3.5 g/dL (ref 3.5–5.0)
Alkaline Phosphatase: 103 U/L (ref 38–126)
Anion gap: 17 — ABNORMAL HIGH (ref 5–15)
BUN: 10 mg/dL (ref 6–20)
CO2: 20 mmol/L — ABNORMAL LOW (ref 22–32)
Calcium: 8.6 mg/dL — ABNORMAL LOW (ref 8.9–10.3)
Chloride: 100 mmol/L (ref 98–111)
Creatinine, Ser: 0.53 mg/dL — ABNORMAL LOW (ref 0.61–1.24)
GFR, Estimated: >60 mL/min (ref >60)
Glucose, Bld: 92 mg/dL (ref 70–99)
Potassium: 3 mmol/L — ABNORMAL LOW (ref 3.5–5.1)
Sodium: 137 mmol/L (ref 135–145)
Total Bilirubin: 0.7 mg/dL (ref 0.0–1.2)
Total Protein: 7.6 g/dL (ref 6.5–8.1)

## 2024-04-29 LAB — ACETAMINOPHEN LEVEL: Acetaminophen (Tylenol), Serum: <10 ug/mL — ABNORMAL LOW (ref 10–30)

## 2024-04-29 LAB — URINE DRUG SCREEN, QUALITATIVE (ARMC ONLY)
Amphetamines, Ur Screen: NOT DETECTED
Barbiturates, Ur Screen: NOT DETECTED
Benzodiazepine, Ur Scrn: NOT DETECTED
Cannabinoid 50 Ng, Ur ~~LOC~~: POSITIVE — AB
Cocaine Metabolite,Ur ~~LOC~~: NOT DETECTED
MDMA (Ecstasy)Ur Screen: NOT DETECTED
Methadone Scn, Ur: NOT DETECTED
Opiate, Ur Screen: NOT DETECTED
Phencyclidine (PCP) Ur S: NOT DETECTED
Tricyclic, Ur Screen: NOT DETECTED

## 2024-04-29 LAB — SALICYLATE LEVEL: Salicylate Lvl: <7 mg/dL — ABNORMAL LOW (ref 7.0–30.0)

## 2024-04-29 LAB — ETHANOL: Alcohol, Ethyl (B): 65 mg/dL — ABNORMAL HIGH (ref <15)

## 2024-04-29 LAB — TROPONIN I (HIGH SENSITIVITY): Troponin I (High Sensitivity): 5 ng/L (ref <18)

## 2024-04-29 LAB — MAGNESIUM: Magnesium: 2 mg/dL (ref 1.7–2.4)

## 2024-04-29 LAB — LACTIC ACID, PLASMA: Lactic Acid, Venous: 2.4 mmol/L (ref 0.5–1.9)

## 2024-04-29 MED ORDER — LACTATED RINGERS IV BOLUS
1000.0000 mL | Freq: Once | INTRAVENOUS | Status: AC
Start: 2024-04-29 — End: 2024-04-30
  Administered 2024-04-29: 1000 mL via INTRAVENOUS

## 2024-04-29 NOTE — ED Notes (Addendum)
 Kyra EDT and Melanie RN assisted pt with dress out.  Patient belongings: wireless headphones Black cell phone/screen intact 1 Brown hat 1 pair black shoes I red short 1 black boxer 1 gray swear pant 1 red hooded sweater 1 wallet inside sweat pant 1 lighter inside short pocket. 1 pair of stud earings and cross silver necklace placed inside a urine sample cup. 1 green backpack of belongings 1 trash bag of belongings

## 2024-04-29 NOTE — ED Notes (Signed)
 CCMD called to initiate cardiac monitoring.

## 2024-04-29 NOTE — ED Notes (Signed)
 [facility] contacted for psychiatric consult

## 2024-04-29 NOTE — ED Notes (Signed)
 1 pair black shoes I red short 1 black boxer 1 gray swear pant 1 red hooded sweater 1 wallet inside sweat pant 1 lighter inside short pocket. 1 pair of stud earings and cross silver necklace placed inside a urine sample cup.

## 2024-04-29 NOTE — ED Notes (Signed)
 Poison control called. Advised to collect salicylate, lactate, tylenol , cmp, ethanol, and mag levels. Advised to monitor until 0230. Repeat EKG at 0230.

## 2024-04-29 NOTE — ED Provider Notes (Addendum)
-----------------------------------------   11:24 PM on 04/29/2024 -----------------------------------------  Assuming care from Dr. Waymond.  In short, Christian Dickerson is a 38 y.o. male with a chief complaint of possible OD.  Refer to the original H&P for additional details.  The current plan of care is to reassess at 2:30a and obtain a second ECG.  Anticipate medical clearance for psych eval at that point.   Clinical Course as of 04/30/24 0329  Fri Apr 29, 2024  2143 [place] with control was called, says to monitor him up to 12 hours after his ingestion so to observe him till 2:30 AM.  To repeat EKG at 2:30 AM.  If no changes, he is medically cleared for psych. [TT]  2145 DG Ankle Complete Right IMPRESSION: 1. No acute findings. 2. Stable postprocedural changes, as above.   [TT]  2216 DG Tibia/Fibula Right IMPRESSION: 1. No acute fracture or dislocation. 2. Distal tibial/fibula described on dedicated ankle radiographs.   [TT]  2257 Independent review of labs, cannabinoid positive, no leukocytosis, ethanol levels mildly elevated, Tylenol  and salicylate not elevated.  Magnesium is not elevated.  Lactate is mildly elevated, he is getting fluids. [TT]  Sat Apr 30, 2024  9762 Patient has been evaluated by psychiatry and Dr. Drury feels the patient represents an immediate danger to himself given the number of acute and chronic social issues.  Even though the patient is now claiming that he lied about taking medications, Dr. Drury still feels he is at significant risk given all of the factors involved and that he continues to meet IVC criteria and inpatient treatment criteria.  I have discontinued the patient's cardiac monitoring and a repeat EKG was reassuring.  I am repeating a lactic acid to see if that has normalized but at this point he is considered medically clear for psychiatric disposition [CF]  0329 Lactic Acid, Venous: 1.5 Lactic acid is normalized.  I reiterated that the patient is  medically cleared. [CF]    Clinical Course User Index [CF] Gordan Huxley, MD [TT] Waymond Lorelle Cummins, MD    ED ECG REPORT I, Huxley Gordan, the attending physician, personally viewed and interpreted this ECG.  Date: 04/30/2024 EKG Time: 2:30 AM Rate: 80 Rhythm: normal sinus rhythm QRS Axis: normal Intervals: normal ST/T Wave abnormalities: Non-specific ST segment / T-wave changes, but no clear evidence of acute ischemia. Narrative Interpretation: no definitive evidence of acute ischemia; does not meet STEMI criteria.    Medications  ziprasidone (GEODON) injection 10 mg (has no administration in time range)  diazepam (VALIUM) injection 10 mg (has no administration in time range)  diphenhydrAMINE  (BENADRYL ) injection 50 mg (has no administration in time range)  hydrOXYzine (ATARAX) tablet 50 mg (has no administration in time range)  ARIPiprazole (ABILIFY) tablet 5 mg (has no administration in time range)  FLUoxetine (PROZAC) capsule 10 mg (has no administration in time range)  gabapentin (NEURONTIN) capsule 300 mg (has no administration in time range)  traZODone (DESYREL) tablet 100 mg (has no administration in time range)  lactated ringers bolus 1,000 mL (0 mLs Intravenous Stopped 04/30/24 0155)     ED Discharge Orders     None      Final diagnoses:  Intentional overdose, initial encounter (HCC)  Suicidal ideation  Acute right ankle pain     Gordan Huxley, MD 04/30/24 9670    Gordan Huxley, MD 04/30/24 [phone]

## 2024-04-29 NOTE — ED Provider Notes (Signed)
 [facility] Provider Note    Event Date/Time   First MD Initiated Contact with Patient 04/29/24 2055     (approximate)   History   Suicide Attempt   HPI  Christian Dickerson is a 38 y.o. male with history of alcohol abuse, suicidal attempts, MDD, presenting with suicide attempt.  Girlfriend have been reported that patient took 29 tabs of 5 mg blood pressure medications, based on his med list he is on amlodipine .  He also drank 2 40 ounce beers.  States that he took the medications at around 2:30 PM today.  States he felt very tired today.  No chest pain or shortness of breath, states that he has history of prior right ankle fractures, states that he fell 2 hours prior to presentation and noted pain and swelling to the right ankle.  No focal weakness or numbness.  Per independent history from EMS, patient is homeless, has history of multiple suicide attempts in the past.     Physical Exam   Triage Vital Signs: ED Triage Vitals  Encounter Vitals Group     BP      Girls Systolic BP Percentile      Girls Diastolic BP Percentile      Boys Systolic BP Percentile      Boys Diastolic BP Percentile      Pulse      Resp      Temp      Temp src      SpO2      Weight      Height      Head Circumference      Peak Flow      Pain Score      Pain Loc      Pain Education      Exclude from Growth Chart     Most recent vital signs: Vitals:   04/29/24 2315 04/29/24 2330  BP: 113/74 115/69  Pulse: 81 80  Resp: (!) 22 19  Temp:    SpO2: 99% 99%     General: Awake, no distress.  CV:  Good peripheral perfusion.  Resp:  Normal effort.  No tachypnea or respiratory distress Abd:  No distention.  Soft nontender Other:  Palpable DP pulse on the right, sensation and strength is intact, he does have some swelling especially over the medial malleoli with associated tenderness, no tenderness to the rest of his leg or foot.  No open wounds.   ED Results /  Procedures / Treatments   Labs (all labs ordered are listed, but only abnormal results are displayed) Labs Reviewed  ACETAMINOPHEN  LEVEL - Abnormal; Notable for the following components:      Result Value   Acetaminophen  (Tylenol ), Serum <10 (*)    All other components within normal limits  COMPREHENSIVE METABOLIC PANEL WITH GFR - Abnormal; Notable for the following components:   Potassium 3.0 (*)    CO2 20 (*)    Creatinine, Ser 0.53 (*)    Calcium 8.6 (*)    Anion gap 17 (*)    All other components within normal limits  ETHANOL - Abnormal; Notable for the following components:   Alcohol, Ethyl (B) 65 (*)    All other components within normal limits  SALICYLATE LEVEL - Abnormal; Notable for the following components:   Salicylate Lvl <7.0 (*)    All other components within normal limits  CBC WITH DIFFERENTIAL/PLATELET - Abnormal; Notable for the following components:   RBC  3.78 (*)    HCT 38.9 (*)    MCV 102.9 (*)    MCH 34.9 (*)    All other components within normal limits  URINE DRUG SCREEN, QUALITATIVE ([facility] ONLY) - Abnormal; Notable for the following components:   Cannabinoid 50 Ng, Ur [place] POSITIVE (*)    All other components within normal limits  LACTIC ACID, PLASMA - Abnormal; Notable for the following components:   Lactic Acid, Venous 2.4 (*)    All other components within normal limits  MAGNESIUM  TROPONIN I (HIGH SENSITIVITY)     EKG  EKG shows, sinus rhythm, rate 84, normal QS, normal QTc, no obvious ischemic ST elevation, not significantly changed compared to prior   RADIOLOGY On my independent interpretation, x-ray without obvious fracture   PROCEDURES:  Critical Care performed: No  Procedures   MEDICATIONS ORDERED IN ED: Medications  lactated ringers bolus 1,000 mL (1,000 mLs Intravenous New Bag/Given 04/29/24 2259)     IMPRESSION / MDM / ASSESSMENT AND PLAN / ED COURSE  I reviewed the triage vital signs and the nursing notes.                               Differential diagnosis includes, but is not limited to, attempted overdose, decompensated psych, electrolyte derangements, fracture, contusion, strain, sprain.  Will get labs, given his suicide attempt, place him on IVC hold.  X-ray of his ankle and leg.  Poison control consult.  Patient's presentation is most consistent with acute presentation with potential threat to life or bodily function.  Independent interpretation of labs and imaging below.  Patient signed out pending observation until 2:30 AM and to repeat EKG at that time.  The patient is on the cardiac monitor to evaluate for evidence of arrhythmia and/or significant heart rate changes.   Clinical Course as of 04/29/24 2340  Fri Apr 29, 2024  2143 [place] with control was called, says to monitor him up to 12 hours after his ingestion so to observe him till 2:30 AM.  To repeat EKG at 2:30 AM.  If no changes, he is medically cleared for psych. [TT]  2145 DG Ankle Complete Right IMPRESSION: 1. No acute findings. 2. Stable postprocedural changes, as above.   [TT]  2216 DG Tibia/Fibula Right IMPRESSION: 1. No acute fracture or dislocation. 2. Distal tibial/fibula described on dedicated ankle radiographs.   [TT]  2257 Independent review of labs, cannabinoid positive, no leukocytosis, ethanol levels mildly elevated, Tylenol  and salicylate not elevated.  Magnesium is not elevated.  Lactate is mildly elevated, he is getting fluids. [TT]    Clinical Course User Index [TT] Waymond, Lorelle Cummins, MD     FINAL CLINICAL IMPRESSION(S) / ED DIAGNOSES   Final diagnoses:  Intentional overdose, initial encounter (HCC)  Suicidal ideation  Acute right ankle pain     Rx / DC Orders   ED Discharge Orders     None        Note:  This document was prepared using Dragon voice recognition software and may include unintentional dictation errors.    Waymond Lorelle Cummins, MD 04/29/24 [phone]

## 2024-04-29 NOTE — ED Triage Notes (Signed)
 Pt arrived via [facility] with CC of suicide attempt. EMS reports that pt took 29 5-mg BP pills and 2 40-oz beers. Pt states that he doesn't know what the medication was but it was what you gave me last time. Chart shows amlodipine  5mg  prescribed. EMS reports that pt is homeless, has hx of of diverticulitis and right leg swelling. Pt states that he has had multiple suicide attempts.

## 2024-04-29 NOTE — BH Assessment (Signed)
 Comprehensive Clinical Assessment (CCA) Note  04/29/2024 Christian Dickerson [id number]  Chief Complaint:  Chief Complaint  Patient presents with   Suicide Attempt   Christian Dickerson arrived to the ED by way of EMS. He reports, "My leg hurt real bad and I took some medicine". He reported that "I tried to kill myself by taking too much blood pressure medicine".  He shared that he took 74 lodatrines pills.  He stated "My life is shit and I feel this way everyday".  Christian Dickerson reports symptoms of depression "I just don't give a fuck, I just don't care".  He shared that he does not know his feelings. He denied symptoms of anxiety.  He denied having auditory or visual hallucinations.  When asked homicidal ideation or intent, he stated "I'm not sure".  He was unsure of whether he wanted to harm himself further. He reports that he 2 - 4o oz beers and smoked 2 joints. He reports a prior diagnosis Depression.   Visit Diagnosis: Mood Disorder   CCA Screening, Triage and Referral (STR)  Patient Reported Information How did you hear about us ? Self  What Is the Reason for Your Visit/Call Today? Overdose, leg pain  How Long Has This Been Causing You Problems? > than 6 months  What Do You Feel Would Help You the Most Today? Treatment for Depression or other mood problem   Have You Recently Had Any Thoughts About Hurting Yourself? Yes  Are You Planning to Commit Suicide/Harm Yourself At This time? Yes   Flowsheet Row ED from 04/29/2024 in [facility] Emergency Department at [facility] Admission (Discharged) from 04/09/2024 in [facility] INPATIENT BEHAVIORAL MEDICINE ED from 04/08/2024 in [facility] Emergency Department at [facility]  C-SSRS RISK CATEGORY High Risk No Risk High Risk    Have you Recently Had Thoughts About Hurting Someone [place]? No  Are You Planning to Harm Someone at This Time? No data recorded Explanation: No data recorded  Have You Used Any Alcohol or Drugs in the Past 24  Hours? Yes  How Long Ago Did You Use Drugs or Alcohol? Today, 2 - 40 oz beers  What Did You Use and How Much? 5 fifths of vodka   Do You Currently Have a Therapist/Psychiatrist? No  Name of Therapist/Psychiatrist:    Have You Been Recently Discharged From Any Office Practice or Programs? No data recorded Explanation of Discharge From Practice/Program: No data recorded    CCA Screening Triage Referral Assessment Type of Contact: Face-to-Face  Telemedicine Service Delivery:   Is this Initial or Reassessment?   Date Telepsych consult ordered in [facility]:    Time Telepsych consult ordered in [facility]:    Location of Assessment: [facility] ED  Provider Location: [facility] ED   Collateral Involvement: No data recorded  Does Patient Have a Court Appointed Legal Guardian? No  Legal Guardian Contact Information: No data recorded Copy of Legal Guardianship Form: No data recorded Legal Guardian Notified of Arrival: No data recorded Legal Guardian Notified of Pending Discharge: No data recorded If Minor and Not Living with Parent(s), Who has Custody? No data recorded Is CPS involved or ever been involved? No data recorded Is APS involved or ever been involved? No data recorded  Patient Determined To Be At Risk for Harm To Self or Others Based on Review of Patient Reported Information or Presenting Complaint? Yes, for Self-Harm  Method: Plan without intent  Availability of Means: No data recorded Intent: No data recorded Notification Required: No data recorded  Additional Information for Danger to Others Potential: No data recorded Additional Comments for Danger to Others Potential: No data recorded Are There Guns or Other Weapons in Your Home? No data recorded Types of Guns/Weapons: No data recorded Are These Weapons Safely Secured?                            No data recorded Who Could Verify You Are Able To Have These Secured: No data recorded Do You Have any Outstanding Charges, Pending Court  Dates, Parole/Probation? No data recorded Contacted To Inform of Risk of Harm To Self or Others: No data recorded   Does Patient Present under Involuntary Commitment? Yes    [place] of Residence: [place]   Patient Currently Receiving the Following Services: Not Receiving Services   Determination of Need: Emergent (2 hours)   Options For Referral: Inpatient Hospitalization     CCA Biopsychosocial Patient Reported Schizophrenia/Schizoaffective Diagnosis in Past: No   Strengths: No data recorded  Mental Health Symptoms Depression:  Hopelessness   Duration of Depressive symptoms:    Mania:  No data recorded  Anxiety:   No data recorded  Psychosis:  No data recorded  Duration of Psychotic symptoms:    Trauma:  No data recorded  Obsessions:  No data recorded  Compulsions:  No data recorded  Inattention:  No data recorded  Hyperactivity/Impulsivity:  No data recorded  Oppositional/Defiant Behaviors:  No data recorded  Emotional Irregularity:  No data recorded  Other Mood/Personality Symptoms:  No data recorded   Mental Status Exam Appearance and self-care  Stature:  Average   Weight:  Overweight   Clothing:  Disheveled   Grooming:  Normal   Cosmetic use:  None   Posture/gait:  Slumped   Motor activity:  No data recorded  Sensorium  Attention:  Normal   Concentration:  Normal   Orientation:  No data recorded  Recall/memory:  Defective in Recent   Affect and Mood  Affect:  Flat   Mood:  Hopeless   Relating  Eye contact:  None   Facial expression:  No data recorded  Attitude toward examiner:  Cooperative   Thought and Language  Speech flow: Clear and Coherent   Thought content:  Appropriate to Mood and Circumstances   Preoccupation:  None   Hallucinations:  None   Organization:  Engineer, Site of Knowledge:  Average   Intelligence:  No data recorded  Abstraction:  No data recorded  Judgement:  Impaired    Reality Testing:  No data recorded  Insight:  Fair   Decision Making:  No data recorded  Social Functioning  Social Maturity:  No data recorded  Social Judgement:  No data recorded  Stress  Stressors:  Family conflict; Legal; Housing   Coping Ability:  Deficient supports   Skill Deficits:  No data recorded  Supports:  Support needed     Religion:    Leisure/Recreation:    Exercise/Diet:     CCA Employment/Education Employment/Work Situation:    Education:     CCA Family/Childhood History Family and Relationship History:    Childhood History:          CCA Substance Use Alcohol/Drug Use:                           ASAM's:  Six Dimensions of Multidimensional Assessment  Dimension 1:  Acute Intoxication and/or Withdrawal  Potential:      Dimension 2:  Biomedical Conditions and Complications:      Dimension 3:  Emotional, Behavioral, or Cognitive Conditions and Complications:     Dimension 4:  Readiness to Change:     Dimension 5:  Relapse, Continued use, or Continued Problem Potential:     Dimension 6:  Recovery/Living Environment:     ASAM Severity Score:    ASAM Recommended Level of Treatment:     Substance use Disorder (SUD)    Recommendations for Services/Supports/Treatments:    Disposition Recommendation per psychiatric provider: We recommend inpatient psychiatric hospitalization when medically cleared. Patient is under voluntary admission status at this time; please IVC if attempts to leave hospital.   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/10/2024   Substance induced mood disorder (HCC) 04/09/2024   Acute alcoholic intoxication in alcoholism without complication (HCC) 04/09/2024   Alcohol dependence with alcohol-induced mood disorder (HCC) 04/09/2024   Homicidal ideation 04/09/2024    Suicidal ideation 04/09/2024   Alcohol use disorder 04/09/2024   Colonic diverticular abscess 03/31/2024   Alcoholic hepatitis (HCC) 03/14/2024   Diverticulitis of colon with perforation 03/13/2024   Alcoholism (HCC) 03/13/2024     Referrals to Alternative Service(s): Referred to Alternative Service(s):   Place:   Date:   Time:    Referred to Alternative Service(s):   Place:   Date:   Time:    Referred to Alternative Service(s):   Place:   Date:   Time:    Referred to Alternative Service(s):   Place:   Date:   Time:     Nanetta Paula, Counselor

## 2024-04-30 ENCOUNTER — Encounter: Payer: Self-pay | Admitting: Psychiatry

## 2024-04-30 ENCOUNTER — Inpatient Hospital Stay
Admission: RE | Admit: 2024-04-30 | Discharge: 2024-05-03 | DRG: 896 | Disposition: A | Discharge status: Other Acute Inpatient Hospital | Source: Intra-hospital | Attending: Psychiatry | Admitting: Psychiatry

## 2024-04-30 DIAGNOSIS — Z5948 Other specified lack of adequate food: Secondary | ICD-10-CM | POA: Diagnosis not present

## 2024-04-30 DIAGNOSIS — Z8249 Family history of ischemic heart disease and other diseases of the circulatory system: Secondary | ICD-10-CM | POA: Diagnosis not present

## 2024-04-30 DIAGNOSIS — Z720 Tobacco use: Secondary | ICD-10-CM | POA: Diagnosis not present

## 2024-04-30 DIAGNOSIS — K5790 Diverticulosis of intestine, part unspecified, without perforation or abscess without bleeding: Secondary | ICD-10-CM | POA: Insufficient documentation

## 2024-04-30 DIAGNOSIS — K5721 Diverticulitis of large intestine with perforation and abscess with bleeding: Secondary | ICD-10-CM | POA: Diagnosis present

## 2024-04-30 DIAGNOSIS — Z79899 Other long term (current) drug therapy: Secondary | ICD-10-CM

## 2024-04-30 DIAGNOSIS — Z9151 Personal history of suicidal behavior: Secondary | ICD-10-CM

## 2024-04-30 DIAGNOSIS — F419 Anxiety disorder, unspecified: Secondary | ICD-10-CM | POA: Diagnosis present

## 2024-04-30 DIAGNOSIS — F39 Unspecified mood [affective] disorder: Principal | ICD-10-CM | POA: Diagnosis present

## 2024-04-30 DIAGNOSIS — F1024 Alcohol dependence with alcohol-induced mood disorder: Secondary | ICD-10-CM | POA: Diagnosis present

## 2024-04-30 DIAGNOSIS — Y903 Blood alcohol level of 60-79 mg/100 ml: Secondary | ICD-10-CM | POA: Diagnosis present

## 2024-04-30 DIAGNOSIS — M25571 Pain in right ankle and joints of right foot: Secondary | ICD-10-CM | POA: Diagnosis not present

## 2024-04-30 DIAGNOSIS — Z5900 Homelessness unspecified: Secondary | ICD-10-CM

## 2024-04-30 DIAGNOSIS — Z6831 Body mass index (BMI) 31.0-31.9, adult: Secondary | ICD-10-CM | POA: Diagnosis not present

## 2024-04-30 DIAGNOSIS — I10 Essential (primary) hypertension: Secondary | ICD-10-CM | POA: Diagnosis present

## 2024-04-30 DIAGNOSIS — K5720 Diverticulitis of large intestine with perforation and abscess without bleeding: Secondary | ICD-10-CM | POA: Diagnosis present

## 2024-04-30 DIAGNOSIS — F17210 Nicotine dependence, cigarettes, uncomplicated: Secondary | ICD-10-CM | POA: Diagnosis present

## 2024-04-30 DIAGNOSIS — Z981 Arthrodesis status: Secondary | ICD-10-CM | POA: Diagnosis not present

## 2024-04-30 DIAGNOSIS — F1020 Alcohol dependence, uncomplicated: Secondary | ICD-10-CM | POA: Diagnosis not present

## 2024-04-30 DIAGNOSIS — Z5941 Food insecurity: Secondary | ICD-10-CM | POA: Diagnosis not present

## 2024-04-30 DIAGNOSIS — Z560 Unemployment, unspecified: Secondary | ICD-10-CM | POA: Diagnosis not present

## 2024-04-30 DIAGNOSIS — M25471 Effusion, right ankle: Secondary | ICD-10-CM | POA: Diagnosis present

## 2024-04-30 DIAGNOSIS — F3132 Bipolar disorder, current episode depressed, moderate: Secondary | ICD-10-CM | POA: Diagnosis not present

## 2024-04-30 DIAGNOSIS — K921 Melena: Secondary | ICD-10-CM | POA: Insufficient documentation

## 2024-04-30 DIAGNOSIS — K5732 Diverticulitis of large intestine without perforation or abscess without bleeding: Secondary | ICD-10-CM | POA: Diagnosis not present

## 2024-04-30 DIAGNOSIS — R45851 Suicidal ideations: Secondary | ICD-10-CM | POA: Diagnosis not present

## 2024-04-30 DIAGNOSIS — F332 Major depressive disorder, recurrent severe without psychotic features: Secondary | ICD-10-CM | POA: Diagnosis not present

## 2024-04-30 DIAGNOSIS — E669 Obesity, unspecified: Secondary | ICD-10-CM | POA: Diagnosis present

## 2024-04-30 DIAGNOSIS — Z604 Social exclusion and rejection: Secondary | ICD-10-CM | POA: Diagnosis present

## 2024-04-30 DIAGNOSIS — Z5982 Transportation insecurity: Secondary | ICD-10-CM | POA: Diagnosis not present

## 2024-04-30 DIAGNOSIS — K625 Hemorrhage of anus and rectum: Principal | ICD-10-CM

## 2024-04-30 DIAGNOSIS — K630 Abscess of intestine: Secondary | ICD-10-CM | POA: Diagnosis not present

## 2024-04-30 HISTORY — DX: Other psychoactive substance abuse, uncomplicated: F19.10

## 2024-04-30 LAB — LIPID PANEL
Cholesterol: 158 mg/dL (ref 0–200)
HDL: 42 mg/dL (ref >40)
LDL Cholesterol: 78 mg/dL (ref 0–99)
Total CHOL/HDL Ratio: 3.8 ratio
Triglycerides: 192 mg/dL — ABNORMAL HIGH (ref <150)
VLDL: 38 mg/dL (ref 0–40)

## 2024-04-30 LAB — LACTIC ACID, PLASMA: Lactic Acid, Venous: 1.5 mmol/L (ref 0.5–1.9)

## 2024-04-30 LAB — HEMOGLOBIN A1C
Hgb A1c MFr Bld: 5.2 % (ref 4.8–5.6)
Mean Plasma Glucose: 102.54 mg/dL

## 2024-04-30 MED ORDER — HALOPERIDOL LACTATE 5 MG/ML IJ SOLN: 10.0000 mg | Freq: Three times a day (TID) | INTRAMUSCULAR | Status: DC | PRN

## 2024-04-30 MED ORDER — LORAZEPAM 1 MG PO TABS: 1.0000 mg | ORAL_TABLET | Freq: Two times a day (BID) | ORAL | Status: DC

## 2024-04-30 MED ORDER — ACETAMINOPHEN 325 MG PO TABS: 650.0000 mg | ORAL_TABLET | Freq: Four times a day (QID) | ORAL | Status: DC | PRN

## 2024-04-30 MED ORDER — ARIPIPRAZOLE 10 MG PO TABS: 5.0000 mg | ORAL_TABLET | Freq: Every day | ORAL | Status: DC

## 2024-04-30 MED ORDER — DIPHENHYDRAMINE HCL 50 MG/ML IJ SOLN: 50.0000 mg | Freq: Three times a day (TID) | INTRAMUSCULAR | Status: DC | PRN

## 2024-04-30 MED ORDER — TRAZODONE HCL 50 MG PO TABS
50.0000 mg | ORAL_TABLET | Freq: Every evening | ORAL | Status: DC | PRN
  Administered 2024-04-30 – 2024-05-01 (×2): 50 mg via ORAL
  Filled 2024-04-30 (×2): qty 1

## 2024-04-30 MED ORDER — DIPHENHYDRAMINE HCL 50 MG/ML IJ SOLN: 50.0000 mg | Freq: Four times a day (QID) | INTRAMUSCULAR | Status: DC | PRN

## 2024-04-30 MED ORDER — ONDANSETRON 4 MG PO TBDP: 4.0000 mg | ORAL_TABLET | Freq: Four times a day (QID) | ORAL | Status: DC | PRN

## 2024-04-30 MED ORDER — THIAMINE MONONITRATE 100 MG PO TABS
100.0000 mg | ORAL_TABLET | Freq: Every day | ORAL | Status: DC
  Administered 2024-05-01 – 2024-05-03 (×3): 100 mg via ORAL
  Filled 2024-04-30 (×3): qty 1

## 2024-04-30 MED ORDER — LOPERAMIDE HCL 2 MG PO CAPS: 2.0000 mg | ORAL_CAPSULE | ORAL | Status: DC | PRN

## 2024-04-30 MED ORDER — DIPHENHYDRAMINE HCL 25 MG PO CAPS: 50.0000 mg | ORAL_CAPSULE | Freq: Three times a day (TID) | ORAL | Status: DC | PRN

## 2024-04-30 MED ORDER — ZIPRASIDONE MESYLATE 20 MG IM SOLR: 10.0000 mg | Freq: Four times a day (QID) | INTRAMUSCULAR | Status: DC | PRN

## 2024-04-30 MED ORDER — ARIPIPRAZOLE 5 MG PO TABS
5.0000 mg | ORAL_TABLET | Freq: Every day | ORAL | Status: DC
  Administered 2024-04-30 – 2024-05-02 (×3): 5 mg via ORAL
  Filled 2024-04-30 (×3): qty 1

## 2024-04-30 MED ORDER — CHLORDIAZEPOXIDE HCL 25 MG PO CAPS
25.0000 mg | ORAL_CAPSULE | Freq: Three times a day (TID) | ORAL | Status: AC
  Administered 2024-05-01 – 2024-05-02 (×3): 25 mg via ORAL
  Filled 2024-04-30 (×3): qty 1

## 2024-04-30 MED ORDER — HYDROXYZINE HCL 25 MG PO TABS: 25.0000 mg | ORAL_TABLET | Freq: Four times a day (QID) | ORAL | Status: DC | PRN

## 2024-04-30 MED ORDER — TRAZODONE HCL 50 MG PO TABS: 50.0000 mg | ORAL_TABLET | Freq: Every evening | ORAL | Status: DC | PRN

## 2024-04-30 MED ORDER — TRAZODONE HCL 100 MG PO TABS
100.0000 mg | ORAL_TABLET | Freq: Every evening | ORAL | Status: DC | PRN
Start: 2024-04-30 — End: 2024-04-30

## 2024-04-30 MED ORDER — GABAPENTIN 300 MG PO CAPS
300.0000 mg | ORAL_CAPSULE | Freq: Three times a day (TID) | ORAL | Status: DC
  Administered 2024-04-30: 300 mg via ORAL
  Filled 2024-04-30: qty 1

## 2024-04-30 MED ORDER — AMLODIPINE BESYLATE 5 MG PO TABS
5.0000 mg | ORAL_TABLET | Freq: Every day | ORAL | Status: DC
  Administered 2024-04-30 – 2024-05-03 (×4): 5 mg via ORAL
  Filled 2024-04-30 (×4): qty 1

## 2024-04-30 MED ORDER — ALUM & MAG HYDROXIDE-SIMETH 200-200-20 MG/5ML PO SUSP: 30.0000 mL | ORAL | Status: DC | PRN

## 2024-04-30 MED ORDER — LORAZEPAM 2 MG/ML IJ SOLN: 2.0000 mg | Freq: Three times a day (TID) | INTRAMUSCULAR | Status: DC | PRN

## 2024-04-30 MED ORDER — FLUOXETINE HCL 10 MG PO CAPS
10.0000 mg | ORAL_CAPSULE | Freq: Every day | ORAL | Status: DC
  Administered 2024-04-30: 10 mg via ORAL
  Filled 2024-04-30: qty 1

## 2024-04-30 MED ORDER — HALOPERIDOL 5 MG PO TABS: 5.0000 mg | ORAL_TABLET | Freq: Three times a day (TID) | ORAL | Status: DC | PRN

## 2024-04-30 MED ORDER — POTASSIUM CHLORIDE CRYS ER 20 MEQ PO TBCR
40.0000 meq | EXTENDED_RELEASE_TABLET | Freq: Once | ORAL | Status: AC
  Administered 2024-04-30: 40 meq via ORAL
  Filled 2024-04-30 (×2): qty 2

## 2024-04-30 MED ORDER — LORAZEPAM 1 MG PO TABS: 1.0000 mg | ORAL_TABLET | Freq: Four times a day (QID) | ORAL | Status: DC | PRN

## 2024-04-30 MED ORDER — ACETAMINOPHEN 325 MG PO TABS
650.0000 mg | ORAL_TABLET | Freq: Four times a day (QID) | ORAL | Status: DC | PRN
  Administered 2024-04-30 – 2024-05-02 (×3): 650 mg via ORAL
  Filled 2024-04-30 (×3): qty 2

## 2024-04-30 MED ORDER — CHLORDIAZEPOXIDE HCL 25 MG PO CAPS
25.0000 mg | ORAL_CAPSULE | Freq: Four times a day (QID) | ORAL | Status: AC
  Administered 2024-04-30 – 2024-05-01 (×3): 25 mg via ORAL
  Filled 2024-04-30 (×3): qty 1

## 2024-04-30 MED ORDER — DIAZEPAM 5 MG/ML IJ SOLN: 10.0000 mg | Freq: Four times a day (QID) | INTRAMUSCULAR | Status: DC | PRN

## 2024-04-30 MED ORDER — HYDROXYZINE HCL 25 MG PO TABS: 25.0000 mg | ORAL_TABLET | Freq: Three times a day (TID) | ORAL | Status: DC | PRN

## 2024-04-30 MED ORDER — GABAPENTIN 300 MG PO CAPS
300.0000 mg | ORAL_CAPSULE | Freq: Three times a day (TID) | ORAL | Status: DC
  Administered 2024-04-30 – 2024-05-03 (×8): 300 mg via ORAL
  Filled 2024-04-30 (×8): qty 1

## 2024-04-30 MED ORDER — ADULT MULTIVITAMIN W/MINERALS CH
1.0000 | ORAL_TABLET | Freq: Every day | ORAL | Status: DC
  Administered 2024-04-30 – 2024-05-03 (×4): 1 via ORAL
  Filled 2024-04-30 (×4): qty 1

## 2024-04-30 MED ORDER — HYDROXYZINE HCL 25 MG PO TABS: 50.0000 mg | ORAL_TABLET | Freq: Four times a day (QID) | ORAL | Status: DC | PRN

## 2024-04-30 MED ORDER — HALOPERIDOL LACTATE 5 MG/ML IJ SOLN: 5.0000 mg | Freq: Three times a day (TID) | INTRAMUSCULAR | Status: DC | PRN

## 2024-04-30 MED ORDER — FOLIC ACID 1 MG PO TABS
1.0000 mg | ORAL_TABLET | Freq: Every day | ORAL | Status: DC
  Administered 2024-04-30 – 2024-05-03 (×4): 1 mg via ORAL
  Filled 2024-04-30 (×4): qty 1

## 2024-04-30 MED ORDER — MAGNESIUM HYDROXIDE 400 MG/5ML PO SUSP
30.0000 mL | Freq: Every day | ORAL | Status: DC | PRN
  Filled 2024-04-30: qty 30

## 2024-04-30 MED ORDER — CHLORDIAZEPOXIDE HCL 25 MG PO CAPS
25.0000 mg | ORAL_CAPSULE | ORAL | Status: AC
  Administered 2024-05-02 – 2024-05-03 (×2): 25 mg via ORAL
  Filled 2024-04-30 (×2): qty 1

## 2024-04-30 MED ORDER — FLUOXETINE HCL 10 MG PO CAPS
10.0000 mg | ORAL_CAPSULE | Freq: Every day | ORAL | Status: DC
  Administered 2024-04-30 – 2024-05-03 (×4): 10 mg via ORAL
  Filled 2024-04-30 (×4): qty 1

## 2024-04-30 MED ORDER — LORAZEPAM 1 MG PO TABS
1.0000 mg | ORAL_TABLET | Freq: Four times a day (QID) | ORAL | Status: DC
  Administered 2024-04-30: 1 mg via ORAL
  Filled 2024-04-30: qty 1

## 2024-04-30 MED ORDER — NICOTINE 14 MG/24HR TD PT24
14.0000 mg | MEDICATED_PATCH | Freq: Every day | TRANSDERMAL | Status: DC
Start: 2024-04-30 — End: 2024-05-03
  Administered 2024-04-30 – 2024-05-03 (×4): 14 mg via TRANSDERMAL
  Filled 2024-04-30 (×4): qty 1

## 2024-04-30 MED ORDER — LORAZEPAM 1 MG PO TABS: 1.0000 mg | ORAL_TABLET | Freq: Three times a day (TID) | ORAL | Status: DC

## 2024-04-30 MED ORDER — CHLORDIAZEPOXIDE HCL 25 MG PO CAPS: 25.0000 mg | ORAL_CAPSULE | Freq: Every day | ORAL | Status: DC

## 2024-04-30 MED ORDER — LORAZEPAM 1 MG PO TABS: 1.0000 mg | ORAL_TABLET | Freq: Every day | ORAL | Status: DC

## 2024-04-30 MED ORDER — MAGNESIUM HYDROXIDE 400 MG/5ML PO SUSP: 30.0000 mL | Freq: Every day | ORAL | Status: DC | PRN

## 2024-04-30 MED ORDER — THIAMINE HCL 100 MG/ML IJ SOLN: 100.0000 mg | Freq: Once | INTRAMUSCULAR | Status: DC

## 2024-04-30 NOTE — Group Note (Signed)
 Date:  04/30/2024 Time:  1:54 PM  Group Topic/Focus:    Chief Of Staff. Patients were given the opportunity to go outside to the courtyard to play basketball, corn-hole, draw with chalk and listen to music while enjoying fresh air and sunlight.    Participation Level:  Did Not Attend   Christian Dickerson 04/30/2024, 1:54 PM

## 2024-04-30 NOTE — ED Notes (Signed)
 Patient has been accepted to [facility].  Accepting physician is Dr. Jalapalle.  Call report to [phone].  Representative was [employer].   ER Staff is aware of it:  [facility] ER Secretary  Dr. Gordan, ER MD  Tiffany Patient's Nurse

## 2024-04-30 NOTE — ED Notes (Signed)
 Patient currently refusing to take oral potassium.  Informed patient of importance of medication and reasoning.  Pt stated he does not want to have any additional medications and wants to go home.  MD aware of patient's refusal.

## 2024-04-30 NOTE — Group Note (Signed)
 Recreation Therapy Group Note   Group Topic:Goal Setting  Group Date: 04/30/2024 Start Time: 1000 End Time: 1040 Facilitators: Celestia Jeoffrey BRAVO, LRT, CTRS Location: Craft Room  Group description: Now Future Wall. Patients were given a sheet of paper and asked to fold it into 3 sections, like a pamphlet. Top section, patients were encouraged to write what they are feeling or experiencing "now". The bottom section, patients were asked to fill out how they want to feel or things they want to experience in the "future".  In the middle section, patients were encouraged to fill out any "walls" or barriers that are getting in the way of them reaching their "future". On the back of the sheet, patients were encouraged to write positive coping skills that will help them get over or though the walls they experience. LRT and patients discussed each of the sections and shared them aloud in group. Patients are encouraged to keep this paper with them as a guide/plan post discharge.     Goal Area(s) Addressed:    Patients will identify walls/triggers.   Patients will identify and list coping skills to use post-discharge.   Patients will work on goal setting, short or long-term.   Patients will work on communication by reading aloud to group and engaging in post activity discussion.   Affect/Mood: N/A   Participation Level: Did not attend    Clinical Observations/Individualized Feedback: Patient did not attend group dur to being a new admission.   Plan: Continue to engage patient in RT group sessions 2-3x/week.   Jeoffrey BRAVO Celestia, LRT, CTRS 04/30/2024 10:50 AM

## 2024-04-30 NOTE — ED Notes (Signed)
 Pt given breakfast tray

## 2024-04-30 NOTE — Plan of Care (Signed)
  Problem: Activity: Goal: Interest or engagement in activities will improve Outcome: Progressing Goal: Sleeping patterns will improve Outcome: Progressing   Problem: Education: Goal: Knowledge of [facility] Education information/materials will improve Outcome: Progressing Goal: Emotional status will improve Outcome: Progressing Goal: Mental status will improve Outcome: Progressing Goal: Verbalization of understanding the information provided will improve Outcome: Progressing   
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
## 2024-04-30 NOTE — ED Notes (Signed)
 IVC Patient has been accepted to [facility].  Accepting physician is Dr. Jalapalle.  Call report to [phone].  Representative was [employer].    ER Staff is aware of it:  [facility] ER Secretary  Dr. Gordan, ER MD  Tiffany Patient's Nurse

## 2024-04-30 NOTE — Progress Notes (Signed)
   04/30/24 1151  Psych Admission Type (Psych Patients Only)  Admission Status Involuntary  Psychosocial Assessment  Patient Complaints Anxiety;Depression  Eye Contact Brief  Facial Expression Sad  Affect Sad  Speech Soft  Interaction Assertive  Motor Activity Slow  Appearance/Hygiene In scrubs  Behavior Characteristics Cooperative  Mood Depressed;Sad  Thought Process  Coherency Disorganized  Content Preoccupation  Delusions None reported or observed  Perception WDL  Hallucination None reported or observed  Judgment Impaired  Confusion WDL  Danger to Self  Current suicidal ideation? Denies  Self-Injurious Behavior No self-injurious ideation or behavior indicators observed or expressed   Agreement Not to Harm Self Yes  Description of Agreement verbal  Danger to Others  Danger to Others None reported or observed   Christian Dickerson is a middle-aged male admitted to the behavioral health unit following a suicide attempt by intentional ingestion of approximately 25-29 tablets of amlodipine  ("lodopine"). He was initially evaluated and stabilized on the medical unit due to hypotension and leg swelling before being transferred to psychiatry for further evaluation and treatment of suicidal ideation.  The patient reports being homeless, living outdoors in [place], [place], and feeling hopeless due to his living conditions. He states, "It's hard living outside. nothing's working out." He also endorses heavy daily alcohol use, reporting he drinks "as much as I can all day long," and smokes marijuana ("about two joints a day, whatever I can get"). He denies current use of other illicit substances.  He describes throbbing pain in his left leg rated 7/10, attributing it to swelling from his fall after ingestion. He has a known history of diverticulitis and hypertension. He denies any history of seizures or withdrawal complications but admits to frequent alcohol blackouts.

## 2024-04-30 NOTE — Group Note (Signed)
 Date:  04/30/2024 Time:  9:13 PM  Group Topic/Focus:  Wrap-Up Group:   The focus of this group is to help patients review their daily goal of treatment and discuss progress on daily workbooks.    Participation Level:  Active  Participation Quality:  Appropriate and Attentive  Affect:  Appropriate  Cognitive:  Alert and Appropriate  Insight: Appropriate  Engagement in Group:  Engaged  Modes of Intervention:  Discussion  Additional Comments:     Maglione,Jaymason Ledesma E 04/30/2024, 9:13 PM

## 2024-04-30 NOTE — ED Notes (Signed)
 Pt not wanting to wear heart monitor leads or pulse ox anymore. Gordan MD notified. Okay per MD.

## 2024-04-30 NOTE — ED Notes (Signed)
 Pt upset that he could not have his shoes. This RN explained that due to policy he cannot have any belongings. Pt states this is bullshit

## 2024-05-01 ENCOUNTER — Encounter: Payer: Self-pay | Admitting: Psychiatry

## 2024-05-01 ENCOUNTER — Inpatient Hospital Stay

## 2024-05-01 DIAGNOSIS — F39 Unspecified mood [affective] disorder: Secondary | ICD-10-CM

## 2024-05-01 DIAGNOSIS — K921 Melena: Secondary | ICD-10-CM | POA: Insufficient documentation

## 2024-05-01 DIAGNOSIS — K5790 Diverticulosis of intestine, part unspecified, without perforation or abscess without bleeding: Secondary | ICD-10-CM | POA: Insufficient documentation

## 2024-05-01 LAB — COMPREHENSIVE METABOLIC PANEL WITH GFR
ALT: 35 U/L (ref 0–44)
AST: 39 U/L (ref 15–41)
Albumin: 3.6 g/dL (ref 3.5–5.0)
Alkaline Phosphatase: 103 U/L (ref 38–126)
Anion gap: 12 (ref 5–15)
BUN: 9 mg/dL (ref 6–20)
CO2: 24 mmol/L (ref 22–32)
Calcium: 9.3 mg/dL (ref 8.9–10.3)
Chloride: 103 mmol/L (ref 98–111)
Creatinine, Ser: 0.58 mg/dL — ABNORMAL LOW (ref 0.61–1.24)
GFR, Estimated: >60 mL/min (ref >60)
Glucose, Bld: 102 mg/dL — ABNORMAL HIGH (ref 70–99)
Potassium: 4.3 mmol/L (ref 3.5–5.1)
Sodium: 139 mmol/L (ref 135–145)
Total Bilirubin: 0.9 mg/dL (ref 0.0–1.2)
Total Protein: 7.8 g/dL (ref 6.5–8.1)

## 2024-05-01 LAB — CBC
HCT: 40.2 % (ref 39.0–52.0)
Hemoglobin: 13.8 g/dL (ref 13.0–17.0)
MCH: 35.2 pg — ABNORMAL HIGH (ref 26.0–34.0)
MCHC: 34.3 g/dL (ref 30.0–36.0)
MCV: 102.6 fL — ABNORMAL HIGH (ref 80.0–100.0)
Platelets: 180 K/uL (ref 150–400)
RBC: 3.92 MIL/uL — ABNORMAL LOW (ref 4.22–5.81)
RDW: 13.4 % (ref 11.5–15.5)
WBC: 7.8 K/uL (ref 4.0–10.5)
nRBC: 0 % (ref 0.0–0.2)

## 2024-05-01 MED ORDER — IOHEXOL 300 MG/ML  SOLN
75.0000 mL | Freq: Once | INTRAMUSCULAR | Status: AC | PRN
  Administered 2024-05-01: 75 mL via INTRAVENOUS

## 2024-05-01 MED ORDER — NAPROXEN 500 MG PO TABS
250.0000 mg | ORAL_TABLET | Freq: Two times a day (BID) | ORAL | Status: DC
  Administered 2024-05-01 – 2024-05-02 (×2): 250 mg via ORAL
  Filled 2024-05-01 (×2): qty 1

## 2024-05-01 NOTE — Progress Notes (Signed)
 Ice pack provided to patient for right lower extremity pain and edema per order. Patient instructed to rest and elevate leg. Patient also educated on new medication added for swelling

## 2024-05-01 NOTE — Group Note (Signed)
 Date:  05/01/2024 Time:  10:33 AM  Group Topic/Focus:  Relapse Prevention Planning:   The focus of this group is to define relapse and discuss the need for planning to combat relapse.    Participation Level:  Active  Participation Quality:  Appropriate  Affect:  Appropriate  Cognitive:  Appropriate  Insight: Appropriate  Engagement in Group:  Engaged  Modes of Intervention:  Activity  Additional Comments:    Camellia HERO Sonny Poth 05/01/2024, 10:33 AM

## 2024-05-01 NOTE — BHH Counselor (Signed)
 Adult Comprehensive Assessment  Patient ID: Christian Dickerson, male   DOB: 25-Oct-1985, 38 y.o.   MRN: [id number]  Information Source: Information source: Patient  Current Stressors:  Patient states their primary concerns and needs for treatment are:: My leg is really messed up. They called my sister and ended up brining me down here. Patient states their goals for this hospitilization and ongoing recovery are:: I want to not be outside. Educational / Learning stressors: Patient denied. Employment / Job issues: I don't have no way to be clean, my ID is expired. Family Relationships: All I have is my brother and sister and they're helping me as much as they can but they don't have a lot. Financial / Lack of resources (include bankruptcy): Nothing has changed. It just keeps getting worst. Housing / Lack of housing: I've been on the streets for about a week. Physical health (include injuries & life threatening diseases): I took a bunch of high blood pressure medicine and have been walking a lot so my leg is very swollen. Social relationships: I don't really have any friends. Substance abuse: The day I got out, I met a man and a girl and started drinking, doing heroine, crack and fentanyl . That lasted from Tuesday-Sunday. Bereavement / Loss: Patient reported that his mother passed in 05-Jun-2016 and that he is doing the best I can do.  Living/Environment/Situation:  Living Arrangements: Alone Living conditions (as described by patient or guardian): Patient reported that he has been living on the streets. Who else lives in the home?: Patient reported that he lives alone. How long has patient lived in current situation?: Over a week. What is atmosphere in current home: Dangerous, Chaotic, Other (Comment)  Family History:  Marital status: Long term relationship Long term relationship, how long?: Since 05-Jun-2017. What types of issues is patient dealing with in the relationship?: Patient  denied. Are you sexually active?: No What is your sexual orientation?: Heterosexual. Has your sexual activity been affected by drugs, alcohol, medication, or emotional stress?: Patient denied. Does patient have children?: Yes How many children?: 5 How is patient's relationship with their children?: I haven't talked to them since 06/05/2017.  Childhood History:  By whom was/is the patient raised?: Mother Description of patient's relationship with caregiver when they were a child: She wasn't really around a lot. After my dad died, she was gone. I was in trouble a lot. In and out of the system. Patient's description of current relationship with people who raised him/her: Patient reported that his mother died in 06-05-16. How were you disciplined when you got in trouble as a child/adolescent?: I was locked up. Does patient have siblings?: Yes Number of Siblings: 5 Description of patient's current relationship with siblings: I don't talk to none of them but Reyes. We grew up together. Did patient suffer any verbal/emotional/physical/sexual abuse as a child?: No Did patient suffer from severe childhood neglect?: No Has patient ever been sexually abused/assaulted/raped as an adolescent or adult?: No Witnessed domestic violence?: No Has patient been affected by domestic violence as an adult?: No  Education:  Highest grade of school patient has completed: HS Diploma. Currently a student?: No Learning disability?: No  Employment/Work Situation:   Employment Situation: Unemployed Patient's Job has Been Impacted by Current Illness: Yes Describe how Patient's Job has Been Impacted: Patient reported that his alcohol use eventually led to him losing his job. What is the Longest Time Patient has Held a Job?: A little over 8 years. Where was the Patient  Employed at that Time?: [employer]. Has Patient ever Been in the [employer]?: No  Financial Resources:   Financial resources:  Medicaid Does patient have a lawyer or guardian?: No  Alcohol/Substance Abuse:   What has been your use of drugs/alcohol within the last 12 months?: The day I got out, I met a man and a girl and started drinking, doing heroine, crack and fentynl. That lasted from Tuesday-Sunday. If attempted suicide, did drugs/alcohol play a role in this?: Yes (I had taken a needle from the hospital and had been drinking. ) If yes, describe treatment: At last admission, patient discharged to [facility] and left after a day. Patient reported that he was accepted to [facility]'s detox program and was supposed to be there 04/30/24 at 9 AM. Has alcohol/substance abuse ever caused legal problems?: Yes (Patient reported two DUIs in two months.)  Social Support System:   Patient's Community Support System: Good Describe Community Support System: My brother, my girlfriend and my sister. Type of faith/religion: I'm a Christian. How does patient's faith help to cope with current illness?: Not a very good one.  Leisure/Recreation:   Do You Have Hobbies?: No  Strengths/Needs:   What is the patient's perception of their strengths?: Patient denied. Patient states they can use these personal strengths during their treatment to contribute to their recovery: Patient denied. Patient states these barriers may affect/interfere with their treatment: Patient reported being unhoused. Patient states these barriers may affect their return to the community: Patient is currently unhoused and has no incom. Other important information patient would like considered in planning for their treatment: Patient would like therapy and psychiatry and residental treatment in [place] or [place] county.  Discharge Plan:   Currently receiving community mental health services: No Patient states concerns and preferences for aftercare planning are: Patient would like therapy and psychiatry and  residental treatment in [place] or [place] county. Patient states they will know when they are safe and ready for discharge when: I know that I need to do something to fix my life I just don't know what that something is. Does patient have access to transportation?: No Does patient have financial barriers related to discharge medications?: No Patient description of barriers related to discharge medications: None reported. Plan for no access to transportation at discharge: CSW to assist with transportaton needs at discharge. Will patient be returning to same living situation after discharge?:  (Patient is unhoused.)  Summary/Recommendations:   Summary and Recommendations (to be completed by the evaluator): Patient is a 38 year old male from [place], [place] [facility]) with history of alcohol abuse, suicide attempts, MDD who presented to the ED due to suicide attempt according to chart. During assessment with this clinical research associate, patient reported My leg is really messed up. They called my sister and ended up brining me down here. Patient is currently unemployed and reported I don't have no way to be clean, my ID is expired. When asked of family relationships, patient reported All I have is my brother and sister and they're helping me as much as they can but they don't have a lot. Patient is currently unhoused and reported being unhoused for about a week. When asked of finances, patient reported Nothing has changed. It just keeps getting worst. When asked of physical health, patient reported I took a bunch of high blood pressure medicine and have been walking a lot so my leg is very swollen. When asked of substance use, patient reported The day I  got out, I met a man and a girl and started drinking, doing heroine, crack and fentanyl . That lasted from Tuesday-Sunday. When asked of bereavement stressors, patient reported Patient reported that his mother passed in 2017 and that he is doing the  best I can do. Patient has been in a long-term relationship since 2018 and denied relationship stressors. At last admission, patient discharged to [facility] and left after a day. Patient reported that he was accepted to [facility]'s detox program and was supposed to be there 04/30/24 at 9 AM. Patient endorsed receiving adequate support from My brother, my girlfriend and my sister. Patient is not currently followed by a therapist or psychiatrist but is open to a referral. Patient is interested in residential treatment. Patient denied SI, HI and AVH. Patient's current diagnosis is Mood disorder. Recommendations include: crisis stabilization, therapeutic milieu, encourage group attendance and participation, medication management for mood stabilization and development of a comprehensive mental wellness/sobriety plan.  Lyrah Bradt M Kathyjo Briere. 05/01/2024

## 2024-05-01 NOTE — BHH Counselor (Signed)
 Patient reported that he was accepted to [facility] and was due to start yesterday 04/30/24. CSW touched base with program and spoke with representative who reported that the they cannot accept people who have gone through detox within the last 30 days. Specialist reported that with the patient's recent 9-day hospital stay, he will need to call in the morning 05/02/24 to touch base with the nurse to assess if his approval to the program is still validated.   This has been communicated to patient and team.   CSW to continue to assess.   Tija Biss, MSW, LCSWA 05/01/2024 10:33 AM

## 2024-05-01 NOTE — Group Note (Signed)
 Date:  05/01/2024 Time:  9:49 PM  Group Topic/Focus:  Making Healthy Choices:   The focus of this group is to help patients identify negative/unhealthy choices they were using prior to admission and identify positive/healthier coping strategies to replace them upon discharge. Wrap-Up Group:   The focus of this group is to help patients review their daily goal of treatment and discuss progress on daily workbooks.    Participation Level:  Active  Participation Quality:  Appropriate and Attentive  Affect:  Appropriate  Cognitive:  Alert, Appropriate, and Oriented  Insight: Appropriate and Good  Engagement in Group:  Engaged  Modes of Intervention:  Discussion and Support  Additional Comments:  N/A  Christian Dickerson 05/01/2024, 9:49 PM

## 2024-05-01 NOTE — Progress Notes (Signed)
   05/01/24 1021  Psych Admission Type (Psych Patients Only)  Admission Status Involuntary  Psychosocial Assessment  Patient Complaints None  Eye Contact Fair  Facial Expression Flat  Affect Sad  Speech Soft  Interaction Assertive  Motor Activity Slow  Appearance/Hygiene In scrubs  Behavior Characteristics Cooperative  Mood Depressed  Aggressive Behavior  Effect No apparent injury  Thought Process  Coherency WDL  Content Preoccupation  Delusions None reported or observed  Perception WDL  Hallucination None reported or observed  Judgment Impaired  Confusion WDL  Danger to Self  Current suicidal ideation? Denies  Self-Injurious Behavior No self-injurious ideation or behavior indicators observed or expressed   Agreement Not to Harm Self Yes  Description of Agreement verbal  Danger to Others  Danger to Others None reported or observed

## 2024-05-01 NOTE — BHH Suicide Risk Assessment (Signed)
 BHH INPATIENT:  Family/Significant Other Suicide Prevention Education  Suicide Prevention Education:  Education Completed; Earnie Forbes, [phone], sister; has been identified by the patient as the family member/significant other with whom the patient will be residing, and identified as the person(s) who will aid the patient in the event of a mental health crisis (suicidal ideations/suicide attempt).  With written consent from the patient, the family member/significant other has been provided the following suicide prevention education, prior to the and/or following the discharge of the patient.  The suicide prevention education provided includes the following: Suicide risk factors Suicide prevention and interventions National Suicide Hotline telephone number [facility] assessment telephone number [facility] Emergency Assistance 911 [facility] and/or Residential Mobile Crisis Unit telephone number  Request made of family/significant other to: Remove weapons (e.g., guns, rifles, knives), all items previously/currently identified as safety concern.   Remove drugs/medications (over-the-counter, prescriptions, illicit drugs), all items previously/currently identified as a safety concern.  The family member/significant other verbalizes understanding of the suicide prevention education information provided.  The family member/significant other agrees to remove the items of safety concern listed above.  According to the sister, the patient had "been drinking bad." According to sister, the patient does not have access to any weapons as "they took everything and has nothing." Sister reported that she believes the patient to be a danger to himself when drinking." Sister reported no safety concerns. Sister reported that the patient can not discharge to her home due to personal reasons.   Christian Dickerson 05/01/2024, 3:51 PM

## 2024-05-01 NOTE — BHH Suicide Risk Assessment (Signed)
 [facility] Admission Suicide Risk Assessment   Nursing information obtained from:  Patient Demographic factors:  Male, Caucasian, Low socioeconomic status, Unemployed Current Mental Status:  NA Loss Factors:  Financial problems / change in socioeconomic status Historical Factors:  Prior suicide attempts, Impulsivity Risk Reduction Factors:  Positive social support, Positive therapeutic relationship  Total Time spent with patient: 1 hour Principal Problem: Mood disorder Diagnosis:  Principal Problem:   Mood disorder  Subjective Data:  Patient is a 38 year old male with a past psychiatric history of alcohol use disorder who presented to the [facility] ED complaining of suicide attempt by taking Twenty Nine 5 mg BP pills with two 40 oz beers. Throughout evaluation in ED, patient's report was noted to be inconsistent with stating he overdosed then denying he overdosed.    Patient has a long standing history of alcohol abuse starting about 8-9 years ago. He reports it increased after his wife left with his kids. He has a history of a DUI, court case still pending. He also reports being in jail previously for 10 years.    Patient reports he came in for his ankle hurting and then the ED found out about him taking twenty nine blood pressure pills in attempt to end life. Patient reported it was a suicide attempt. He also then identified he was worried about his foot swelling and hurting so he wanted to get treatment.    Patient reports he did not stay at the facility he was sent to because he did not want to work for no money. He reports he wants to go somewhere he can rebuilt my life. Patient identifies being homeless.    Upon presentation to the ED the patient was medically cleared for reported fall and swelling to right ankle. Right ankle continues to be swollen with patient reporting pain. Otho consulted and directed to Podiatry. Podiatry  (Dr. Alyson) reviewed images and recommended CT scan and a walking  boot (if possible on unit).    Patient currently denies SI, HI, and AVH.    Patient complaining of leg pain. Dicussed utilizing PRN medications for pain. Xrays in ED insignificant (no fracture or break found). Leg appearance/presentation was evaluated in ED prior to admission.   Continued Clinical Symptoms:  Alcohol Use Disorder Identification Test Final Score (AUDIT): 36 The Alcohol Use Disorders Identification Test, Guidelines for Use in Primary Care, Second Edition.  World Science Writer [facility]). Score between 0-7:  no or low risk or alcohol related problems. Score between 8-15:  moderate risk of alcohol related problems. Score between 16-19:  high risk of alcohol related problems. Score 20 or above:  warrants further diagnostic evaluation for alcohol dependence and treatment.   CLINICAL FACTORS:   Alcohol/Substance Abuse/Dependencies More than one psychiatric diagnosis Previous Psychiatric Diagnoses and Treatments   Musculoskeletal: Strength & Muscle Tone: within normal limits Gait & Station: normal Patient leans: N/A  Psychiatric Specialty Exam:  Presentation  General Appearance:  Appropriate for Environment  Eye Contact: Good  Speech: Clear and Coherent  Speech Volume: Normal  Handedness:No data recorded  Mood and Affect  Mood: Euthymic  Affect: Appropriate   Thought Process  Thought Processes: Coherent; Linear  Descriptions of Associations:Intact  Orientation:Full (Time, Place and Person)  Thought Content:Logical  History of Schizophrenia/Schizoaffective disorder:No  Duration of Psychotic Symptoms:No data recorded Hallucinations:Hallucinations: None  Ideas of Reference:None  Suicidal Thoughts:Suicidal Thoughts: No  Homicidal Thoughts:Homicidal Thoughts: No   Sensorium  Memory: Immediate Fair; Recent Fair; Remote Fair  Judgment:  Poor  Insight: Poor   Art Therapist  Concentration: Fair  Attention  Span: Fair  Recall: [employer] of Knowledge: Fair  Language: Fair   Psychomotor Activity  Psychomotor Activity: Psychomotor Activity: Normal   Assets  Assets: Manufacturing Systems Engineer; Desire for Improvement; Social Support   Sleep  Sleep: Sleep: Fair    Physical Exam: Physical Exam Vitals and nursing note reviewed.  Constitutional:      Appearance: Normal appearance.  Pulmonary:     Effort: Pulmonary effort is normal.  Musculoskeletal:        General: Swelling present.  Neurological:     Mental Status: He is alert and oriented to person, place, and time.  Psychiatric:        Mood and Affect: Mood normal.        Behavior: Behavior normal.        Thought Content: Thought content normal.    ROS Blood pressure 121/81, pulse 81, temperature 98.3 F (36.8 C), temperature source Oral, resp. rate 20, height 6' 1 (1.854 m), weight 109.5 kg, SpO2 100%. Body mass index is 31.86 kg/m.   COGNITIVE FEATURES THAT CONTRIBUTE TO RISK:  None    SUICIDE RISK:   Moderate:  Frequent suicidal ideation with limited intensity, and duration, some specificity in terms of plans, no associated intent, good self-control, limited dysphoria/symptomatology, some risk factors present, and identifiable protective factors, including available and accessible social support.  PLAN OF CARE: Inpatient psychiatric admission  I certify that inpatient services furnished can reasonably be expected to improve the patient's condition.   Samay Delcarlo, NP 05/01/2024, 3:37 PM

## 2024-05-01 NOTE — Progress Notes (Signed)
   05/01/24 0851  Psych Admission Type (Psych Patients Only)  Admission Status Involuntary  Psychosocial Assessment  Patient Complaints None  Eye Contact Fair  Facial Expression Flat  Affect Sad  Speech Soft  Interaction Assertive  Motor Activity Slow  Appearance/Hygiene In scrubs  Behavior Characteristics Cooperative  Mood Depressed  Aggressive Behavior  Effect No apparent injury  Thought Process  Coherency WDL  Content Preoccupation  Delusions None reported or observed  Perception WDL  Hallucination None reported or observed  Judgment Impaired  Confusion WDL  Danger to Self  Current suicidal ideation? Denies  Self-Injurious Behavior No self-injurious ideation or behavior indicators observed or expressed   Agreement Not to Harm Self Yes  Description of Agreement verbal  Danger to Others  Danger to Others None reported or observed

## 2024-05-01 NOTE — Plan of Care (Signed)

## 2024-05-01 NOTE — BHH Counselor (Signed)
 Patient provided with Christian Dickerson in the area.   Patient encouraged to call to set up interviews.   CSW to continue to assess.   Shirely Toren, MSW, LCSWA 05/01/2024 10:43 AM

## 2024-05-01 NOTE — Group Note (Signed)
 LCSW Group Therapy Note  Group Date: 05/01/2024 Start Time: 1300 End Time: 1400   Type of Therapy and Topic:  Group Therapy: Positive Affirmations  Participation Level:  Active   Description of Group:   This group addressed positive affirmation towards self and others.  Patients went around the room and identified two positive things about themselves and two positive things about a peer in the room.  Patients reflected on how it felt to share something positive with others, to identify positive things about themselves, and to hear positive things from others/ Patients were encouraged to have a daily reflection of positive characteristics or circumstances.   Therapeutic Goals: Patients will verbalize two of their positive qualities Patients will demonstrate empathy for others by stating two positive qualities about a peer in the group Patients will verbalize their feelings when voicing positive self affirmations and when voicing positive affirmations of others Patients will discuss the potential positive impact on their wellness/recovery of focusing on positive traits of self and others.  Summary of Patient Progress:  Patient actively engaged in the discussion and . Patient was able to identify positive affirmations about themself as well as other group members. Patient demonstrated proficient insight into the subject matter, was respectful of peers, participated throughout the entire session.  Therapeutic Modalities:   Cognitive Behavioral Therapy Motivational Interviewing    Christian Dickerson, [place] 05/01/2024  2:55 PM

## 2024-05-01 NOTE — Consult Note (Addendum)
 Initial Consultation Note   Patient: Christian Dickerson FMW:[id number] DOB: June 03, 1986 PCP: Pcp, No DOA: 04/30/2024 DOS: the patient was seen and examined on 05/01/2024 Primary service: Donnelly Mellow, MD  Referring physician: Donnelly Mellow, MD Reason for consult: Blood in stool  Assessment/Plan: Blood in stool: Patient reported blood in stool today and [facility] was consulted.  Nurse states she saw the blood as well.  Differential diagnoses include diverticular bleed versus diverticulitis versus hemorrhoidal bleed.  Will get CBC and FOBT.  This has been going on for about 1 week which makes it subacute and less concerning.  Given his history of diverticulosis, I suspect this may be a diverticular bleed.  Given the reported duration and his hemodynamic stability with no tachycardia that makes me think it is very slow.  Will check his hemoglobin.  Less concern for upper GI bleed given characteristic of red blood and normal BUN.  No current need for GI as no endoscopic intervention will be performed given his recent diverticulitis.  Less concern for diverticulitis given patient's mild symptoms and his recent completion of antibiotics.  Will continue to follow patient.  Right ankle pain: X-ray negative for acute fractures.  CT pending.  Podiatry consulted by psychiatric team.    Alcohol use disorder: Treatment per psychiatry team.  Mood disorder: Treatment per psychiatry.   [facility] will continue to follow the patient.  HPI: Christian Dickerson is a 38 y.o. male with past medical history of alcohol use disorder, major depressive disorder, presented to the ED with SI and admitted by psychiatry team.  [facility] consulted for hematochezia, melena.  Patient states he has noticed the bleeding for around 1 week.  He had about 15 bowel movements that had blood in them.  He reports bright red blood and states it has been pretty constant.  He states this was not a problem prior to his diverticulitis which has been treated  multiple times.  Currently patient states he has some nausea, and abdominal pain at times but no fevers or chills.  He also does not report any vomiting.  Patient reports a poor diet and states he has been eating well since being admitted.  Review of Systems: As mentioned in the history of present illness. All other systems reviewed and are negative. Past Medical History:  Diagnosis Date   Alcohol abuse    Diverticulitis    Substance abuse (HCC)    Past Surgical History:  Procedure Laterality Date   ANKLE SURGERY     CHEST TUBE INSERTION     LITHOTRIPSY     TIBIA FRACTURE SURGERY     TONSILLECTOMY AND ADENOIDECTOMY     Social History:  reports that he has been smoking cigarettes. He started smoking about 13 months ago. He has a 1.2 pack-year smoking history. He has been exposed to tobacco smoke. He has never used smokeless tobacco. He reports current alcohol use of about 35.0 standard drinks of alcohol per week. He reports current drug use. Drug: Marijuana.  Allergies  Allergen Reactions   Latex     History reviewed. No pertinent family history.  Prior to Admission medications   Medication Sig Start Date End Date Taking? Authorizing Provider  amLODipine  (NORVASC ) 5 MG tablet Take 1 tablet (5 mg total) by mouth daily. 04/19/24 04/19/25  Hunter, Crystal L, PA-C  folic acid  (FOLVITE ) 1 MG tablet Take 1 tablet (1 mg total) by mouth daily. Patient not taking: Reported on 04/29/2024 04/20/24   Katrinka Camelia CROME, PA-C  Multiple  Vitamin (MULTIVITAMIN WITH MINERALS) TABS tablet Take 1 tablet by mouth daily. Patient not taking: Reported on 04/29/2024 04/20/24   Katrinka Camelia CROME, [place]    Physical Exam: Vitals:   04/30/24 1613 05/01/24 0616 05/01/24 0818 05/01/24 1616  BP: 118/72 121/81 121/81 130/80  Pulse: 85 81  82  Resp:  20    Temp: 98.3 F (36.8 C)   98.7 F (37.1 C)  TempSrc: Oral   Oral  SpO2: 99% 100%  98%  Weight:      Height:       Physical Exam General: NAD HENT:  NCAT Lungs: CTAB, no wheeze, rhonchi or rales.  Cardiovascular: Normal heart sounds, no r/m/g, 2+ pulses in all extremities.  Abdomen: Mild TTP in LLQ Rectal: Rectal exam performed did not show any hemorrhoids and did not show any blood in the rectal vault. MSK: right ankle with swelling Neuro: Alert and oriented x4. CN grossly intact Psych: Normal mood and normal affect   Data Reviewed:   Results are pending, will review when available.   DVT ppx: pt ambulatory  Family Communication: Patient updated at bedside  Primary team communication: Thank you very much for involving us  in the care of your patient.  Author: Morene Bathe, MD 05/01/2024 6:49 PM  For on call review [email or web address].

## 2024-05-01 NOTE — Group Note (Signed)
 Date:  05/01/2024 Time:  4:52 PM  Group Topic/Focus:  Wellness Toolbox:   The focus of this group is to discuss various aspects of wellness, balancing those aspects and exploring ways to increase the ability to experience wellness.  Patients will create a wellness toolbox for use upon discharge.    Participation Level:  Minimal  Participation Quality:  Appropriate  Affect:  Appropriate  Cognitive:  Appropriate  Insight: Appropriate  Engagement in Group:  Engaged  Modes of Intervention:  Activity and Socialization  Additional Comments:    Deitra Caron [place] 05/01/2024, 4:52 PM

## 2024-05-01 NOTE — H&P (Signed)
 Psychiatric Admission Assessment Adult  Patient Identification: Christian Dickerson MRN:  [id number] Date of Evaluation:  05/01/2024 Chief Complaint:  Mood disorder [F39]   History of Present Illness:   Patient is a 38 year old male with a past psychiatric history of alcohol use disorder who presented to the [facility] ED complaining of suicide attempt by taking Twenty Nine 5 mg BP pills with two 40 oz beers. Throughout evaluation in ED, patient's report was noted to be inconsistent with stating he overdosed then denying he overdosed.   Patient has a long standing history of alcohol abuse starting about 8-9 years ago. He Dickerson it increased after his wife left with his kids. He has a history of a DUI, court case still pending. He also Dickerson being in jail previously for 10 years.   Patient Dickerson he came in for his ankle hurting and then the ED found out about him taking twenty nine blood pressure pills in attempt to end life. Patient reported it was a suicide attempt. He also then identified he was worried about his foot swelling and hurting so he wanted to get treatment.   Patient Dickerson he did not stay at the facility he was sent to because he did not want to work for no money. He Dickerson he wants to go somewhere he can rebuilt my life. Patient identifies being homeless.   Upon presentation to the ED the patient was medically cleared for reported fall and swelling to right ankle. Right ankle continues to be swollen with patient reporting pain. Otho consulted and directed to Podiatry. Podiatry  (Christian Dickerson) reviewed images and recommended CT scan and a walking boot (if possible on unit).   Patient currently denies SI, HI, and AVH.   From IRIS consultation:  Patient refused to do an interview with me.  He stated that he lied about having overdosed because I'm homeless, and I knew that was what I needed to say.  However, he then said that he didn't want to stay now and demanded to leave.    I reminded patient that he had told several individuals that he had overdosed, including to Christian Dickerson.  According to Christian Dickerson, he said the following:   He reported that "I tried to kill myself by taking too much blood pressure medicine". He shared that he took 82 lodatrines pills. He stated "My life is shit and I feel this way everyday". Christian Dickerson symptoms of depression "I just don't give a fuck, I just don't care". He shared that he does not know his feelings. He denied symptoms of anxiety. He denied having auditory or visual hallucinations. When asked homicidal ideation or intent, he stated "I'm not sure". He was unsure of whether he wanted to harm himself further.    Furthermore, the patient had just been discharged from Psychiatry on October 11, after he had been admitted for suicidal and homicidal ideation.  There was some indication that alcohol intoxication had played some role in his initial presentation, but patient also told staff that he'd had a diagnosis of bipolar disorder when he was younger, although he had never been treated.  Patient's mood instability was such that his provider, Christian Dickerson, felt that he met criteria for a trial of Abilify (a mood stabilizer), Prozac (for depression and anxiety) and Neurontin (for anxiety).  Patient appeared to have no problems with the meds, but at discharge he requested to be taken off the medications, as he was planning to  go to a faith-based sober living facility, where, he reported, he could not take any psychotropic medication (As of this writing, it is unclear whether he attended that program or how he left the program, if he did attend)   Total Time spent with patient: 1 hour Sleep  Sleep:Sleep: Fair  Past Psychiatric History: Alcohol use disorder Psychiatric History:  Information collected from chart and patient  Prev Dx/Sx: Alcohol use Disorder Current Psych Provider: None Home Meds (current):  Previous Med Trials:  Ritalin, Wellbutrin, lithium, Seroquel  Therapy: None  Prior Psych Hospitalization: [facility] 04/09/24 - 04/19/24 Prior Self Harm: Denies Prior Violence: Endorses  Family Psych History: Schizophrenia (paternal aunt) Family Hx suicide: Denies  Social History:  Educational Hx: GED 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 Yes.    Has the patient been a risk to self in the past 6 months? Yes.    Has the patient been a risk to self within the distant past? Yes.    Is the patient a risk to others? Yes.    Has the patient been a risk to others in the past 6 months? Yes.    Has the patient been a risk to others within the distant past? Yes.     Columbia Scale:  Flowsheet Row Admission (Current) from 04/30/2024 in [facility] INPATIENT BEHAVIORAL MEDICINE ED from 04/29/2024 in [facility] Emergency Department at [facility] Admission (Discharged) from 04/09/2024 in [facility] INPATIENT BEHAVIORAL MEDICINE  C-SSRS RISK CATEGORY High Risk High Risk No Risk     Past Medical History:  Past Medical History:  Diagnosis Date   Alcohol abuse    Diverticulitis    Substance abuse (HCC)     Past Surgical History:  Procedure Laterality Date   ANKLE SURGERY     CHEST TUBE INSERTION     LITHOTRIPSY     TIBIA FRACTURE SURGERY     TONSILLECTOMY AND ADENOIDECTOMY     Family History: History reviewed. No pertinent family  history.  Social History:  Social History   Substance and Sexual Activity  Alcohol Use Yes   Alcohol/week: 35.0 standard drinks of alcohol   Types: 35 Shots of liquor per week   Comment: 2 fifths a day     Social History   Substance and Sexual Activity  Drug Use Yes   Types: Marijuana      Allergies:   Allergies  Allergen Reactions   Latex    Lab Results:  Results for orders placed or performed during the hospital encounter of 04/30/24 (from the past 48 hours)  Lipid panel     Status: Abnormal   Collection Time: 04/29/24  9:36 PM  Result Value Ref Range   Cholesterol 158 0 - 200 mg/dL   Triglycerides 807 (H) <150 mg/dL   HDL 42 >59 mg/dL   Total CHOL/HDL Ratio 3.8 RATIO   VLDL 38 0 - 40 mg/dL   LDL Cholesterol 78 0 - 99 mg/dL    Comment:  Total Cholesterol/HDL:CHD Risk Coronary Heart Disease Risk Table                     Men   Women  1/2 Average Risk   3.4   3.3  Average Risk       5.0   4.4  2 X Average Risk   9.6   7.1  3 X Average Risk  23.4   11.0        Use the calculated Patient Ratio above and the CHD Risk Table to determine the patient's CHD Risk.        ATP III CLASSIFICATION (LDL):  <100     mg/dL   Optimal  899-870  mg/dL   Near or Above                    Optimal  130-159  mg/dL   Borderline  839-810  mg/dL   High  >809     mg/dL   Very High Performed at [facility], [address] [place], [place] [address]   Hemoglobin A1c     Status: None   Collection Time: 04/29/24  9:36 PM  Result Value Ref Range   Hgb A1c MFr Bld 5.2 4.8 - 5.6 %    Comment: (NOTE) Diagnosis of Diabetes The following HbA1c ranges recommended by the American Diabetes Association (ADA) Shell Yandow be used as an aid in the diagnosis of diabetes mellitus.  Hemoglobin             Suggested A1C NGSP%              Diagnosis  <5.7                   Non Diabetic  5.7-6.4                Pre-Diabetic  >6.4                   Diabetic  <7.0                    Glycemic control for                       adults with diabetes.     Mean Plasma Glucose 102.54 mg/dL    Comment: Performed at [facility], [address] [place], [place] [address]  Comprehensive metabolic panel     Status: Abnormal   Collection Time: 05/01/24 10:22 AM  Result Value Ref Range   Sodium 139 135 - 145 mmol/L   Potassium 4.3 3.5 - 5.1 mmol/L   Chloride 103 98 - 111 mmol/L   CO2 24 22 - 32 mmol/L   Glucose, Bld 102 (H) 70 - 99 mg/dL    Comment: Glucose reference range applies only to samples taken after fasting for at least 8 hours.   BUN 9 6 - 20 mg/dL   Creatinine, Ser 9.41 (L) 0.61 - 1.24 mg/dL   Calcium 9.3 8.9 - 89.6 mg/dL   Total Protein 7.8 6.5 - 8.1 g/dL   Albumin 3.6 3.5 - 5.0 g/dL   AST 39 15 - 41 U/L   ALT 35 0 - 44 U/L   Alkaline Phosphatase 103 38 - 126 U/L   Total Bilirubin 0.9 0.0 - 1.2 mg/dL   GFR, Estimated >39 >39 mL/min    Comment: (NOTE) Calculated using the CKD-EPI Creatinine Equation (2021)  Anion gap 12 5 - 15    Comment: Performed at [facility], [address] [place], [place] [address]    Blood Alcohol level:  Lab Results  Component Value Date   ETH 65 (H) 04/29/2024   ETH 354 (HH) 04/08/2024    Metabolic Disorder Labs:  Lab Results  Component Value Date   HGBA1C 5.2 04/29/2024   MPG 102.54 04/29/2024   No results found for: PROLACTIN Lab Results  Component Value Date   CHOL 158 04/29/2024   TRIG 192 (H) 04/29/2024   HDL 42 04/29/2024   CHOLHDL 3.8 04/29/2024   VLDL 38 04/29/2024   LDLCALC 78 04/29/2024    Current Medications: Current Facility-Administered Medications  Medication Dose Route Frequency Provider Last Rate Last Admin   acetaminophen  (TYLENOL ) tablet 650 mg  650 mg Oral Q6H PRN Bobbitt, Shalon E, NP   650 mg at 05/01/24 0818   alum & mag hydroxide-simeth (MAALOX/MYLANTA) 200-200-20 MG/5ML suspension 30 mL  30 mL Oral Q4H PRN Bobbitt, Shalon E, NP       amLODipine  (NORVASC ) tablet 5  mg  5 mg Oral Daily Bobbitt, Shalon E, NP   5 mg at 05/01/24 0819   ARIPiprazole (ABILIFY) tablet 5 mg  5 mg Oral QHS Bobbitt, Shalon E, NP   5 mg at 04/30/24 2108   chlordiazePOXIDE  (LIBRIUM ) capsule 25 mg  25 mg Oral TID Armani Gawlik, NP   25 mg at 05/01/24 1252   Followed by   NOREEN ON 05/02/2024] chlordiazePOXIDE  (LIBRIUM ) capsule 25 mg  25 mg Oral BH-qamhs Mattelyn Imhoff, NP       Followed by   NOREEN ON 05/04/2024] chlordiazePOXIDE  (LIBRIUM ) capsule 25 mg  25 mg Oral Daily Suheily Birks, NP       haloperidol (HALDOL) tablet 5 mg  5 mg Oral TID PRN Bobbitt, Shalon E, NP       And   diphenhydrAMINE  (BENADRYL ) capsule 50 mg  50 mg Oral TID PRN Bobbitt, Shalon E, NP       haloperidol lactate (HALDOL) injection 10 mg  10 mg Intramuscular TID PRN Ajibola, Ene A, NP       And   diphenhydrAMINE  (BENADRYL ) injection 50 mg  50 mg Intramuscular TID PRN Ajibola, Ene A, NP       And   LORazepam  (ATIVAN ) injection 2 mg  2 mg Intramuscular TID PRN Ajibola, Ene A, NP       haloperidol lactate (HALDOL) injection 5 mg  5 mg Intramuscular TID PRN Ajibola, Ene A, NP       And   diphenhydrAMINE  (BENADRYL ) injection 50 mg  50 mg Intramuscular TID PRN Ajibola, Ene A, NP       And   LORazepam  (ATIVAN ) injection 2 mg  2 mg Intramuscular TID PRN Ajibola, Ene A, NP       FLUoxetine (PROZAC) capsule 10 mg  10 mg Oral Daily Bobbitt, Shalon E, NP   10 mg at 05/01/24 0819   folic acid  (FOLVITE ) tablet 1 mg  1 mg Oral Daily Bobbitt, Shalon E, NP   1 mg at 05/01/24 0818   gabapentin (NEURONTIN) capsule 300 mg  300 mg Oral TID Bobbitt, Shalon E, NP   300 mg at 05/01/24 1252   hydrOXYzine (ATARAX) tablet 25 mg  25 mg Oral Q6H PRN Bobbitt, Shalon E, NP       loperamide (IMODIUM) capsule 2-4 mg  2-4 mg Oral PRN Bobbitt, Shalon E, NP  magnesium hydroxide (MILK OF MAGNESIA) suspension 30 mL  30 mL Oral Daily PRN Ajibola, Ene A, NP       multivitamin with minerals tablet 1 tablet  1 tablet Oral Daily Bobbitt, Shalon E, NP   1  tablet at 05/01/24 9180   naproxen  (NAPROSYN ) tablet 250 mg  250 mg Oral BID WC Jadapalle, Sree, MD       nicotine  (NICODERM CQ  - dosed in mg/24 hours) patch 14 mg  14 mg Transdermal Daily Jadapalle, Sree, MD   14 mg at 05/01/24 9180   ondansetron  (ZOFRAN -ODT) disintegrating tablet 4 mg  4 mg Oral Q6H PRN Bobbitt, Shalon E, NP       thiamine  (VITAMIN B1) tablet 100 mg  100 mg Oral Daily Bobbitt, Shalon E, NP   100 mg at 05/01/24 0818   traZODone (DESYREL) tablet 50 mg  50 mg Oral QHS PRN Bobbitt, Shalon E, NP   50 mg at 04/30/24 2107   PTA Medications: Medications Prior to Admission  Medication Sig Dispense Refill Last Dose/Taking   amLODipine  (NORVASC ) 5 MG tablet Take 1 tablet (5 mg total) by mouth daily. 30 tablet 0    folic acid  (FOLVITE ) 1 MG tablet Take 1 tablet (1 mg total) by mouth daily. (Patient not taking: Reported on 04/29/2024) 30 tablet 0    Multiple Vitamin (MULTIVITAMIN WITH MINERALS) TABS tablet Take 1 tablet by mouth daily. (Patient not taking: Reported on 04/29/2024) 30 tablet 0     Psychiatric Specialty Exam:  Presentation  General Appearance:  Appropriate for Environment  Eye Contact: Good  Speech: Clear and Coherent  Speech Volume: Normal    Mood and Affect  Mood: Euthymic  Affect: Appropriate   Thought Process  Thought Processes: Coherent; Linear  Descriptions of Associations:Intact  Orientation:Full (Time, Place and Person)  Thought Content:Logical  Hallucinations:Hallucinations: None  Ideas of Reference:None  Suicidal Thoughts:Suicidal Thoughts: No  Homicidal Thoughts:Homicidal Thoughts: No   Sensorium  Memory: Immediate Fair; Recent Fair; Remote Fair  Judgment: Poor  Insight: Poor   Executive Functions  Concentration: Fair  Attention Span: Fair  Recall: [employer] of Knowledge: Fair  Language: Fair   Psychomotor Activity  Psychomotor Activity: Psychomotor Activity: Normal   Assets   Assets: Communication Skills; Desire for Improvement; Social Support    Musculoskeletal: Strength & Muscle Tone: within normal limits Gait & Station: normal  Physical Exam: Physical Exam Vitals and nursing note reviewed.  Constitutional:      Appearance: Normal appearance.  Pulmonary:     Effort: Pulmonary effort is normal.  Musculoskeletal:        General: Swelling present.     Comments: Right ankle swelling  Neurological:     Mental Status: He is alert and oriented to person, place, and time.  Psychiatric:        Mood and Affect: Mood normal.        Behavior: Behavior normal.    Review of Systems  Constitutional:  Negative for fever.  Respiratory:  Negative for shortness of breath.   Cardiovascular:  Negative for chest pain.  Gastrointestinal:  Negative for nausea and vomiting.  Psychiatric/Behavioral:  Positive for substance abuse. Negative for hallucinations and suicidal ideas.   All other systems reviewed and are negative.  Blood pressure 121/81, pulse 81, temperature 98.3 F (36.8 C), temperature source Oral, resp. rate 20, height 6' 1 (1.854 m), weight 109.5 kg, SpO2 100%. Body mass index is 31.86 kg/m.  Principal Diagnosis: Mood disorder Diagnosis:  Principal  Problem:   Mood disorder   Clinical Decision Making:  Treatment Plan Summary:  Safety and Monitoring:             -- Involuntary admission to inpatient psychiatric unit for safety, stabilization and treatment             -- Daily contact with patient to assess and evaluate symptoms and progress in treatment             -- Patient's case to be discussed in multi-disciplinary team meeting             -- Observation Level: q15 minute checks             -- Vital signs:  q12 hours             -- Precautions: suicide, elopement, and assault   2. Psychiatric Diagnoses and Treatment:  Mood Disorder  Abilify 5 mg at bedtime      Fluoxetine 10 mg Folic acid  1 mg Gabapentin 300 mg TID Multivitamin  daily Nicotine  patch 14mg  Thiamine  tablet 100 mg  -- The risks/benefits/side-effects/alternatives to this medication were discussed in detail with the patient and time was given for questions. The patient consents to medication trial.                -- Metabolic profile and EKG monitoring obtained while on an atypical antipsychotic (BMI: Lipid Panel: HbgA1c: QTc:)              -- Encouraged patient to participate in unit milieu and in scheduled group therapies                            3. Medical Issues Being Addressed:  Leg pain/swelling - Xrays in ED insignificant; continue PRN medication for pain Naproxen  250 mg BID for leg pain (podiatry recommends starting with NSAIDS) Appreciate Ortho and Podiatry consult. Podiatry (ChristianSemen) reviewed case and suggested CT scan along with walking boot (if allowed on psych unit) Amlodipine  5 mg  Librium  taper - withdrawal protocol Potassium 3.0 on 04/29/24, repeat lab today is 4.3 WNL   4. Discharge Planning:              -- Social work and case management to assist with discharge planning and identification of hospital follow-up needs prior to discharge             -- Estimated LOS: 3-5 days             -- Discharge Concerns: Need to establish a safety plan; Medication compliance and effectiveness             -- Discharge Goals: Return home with outpatient referrals follow ups  Physician Treatment Plan for Primary Diagnosis: Mood disorder Long Term Goal(s): Improvement in symptoms so as ready for discharge  Short Term Goals: Ability to identify changes in lifestyle to reduce recurrence of condition will improve, Ability to disclose and discuss suicidal ideas, Ability to identify and develop effective coping behaviors will improve, and Ability to identify triggers associated with substance abuse/mental health issues will improve   I certify that inpatient services furnished can reasonably be expected to improve the patient's condition.    Glenys Beal, NP 10/26/20253:43 PM

## 2024-05-01 NOTE — Progress Notes (Signed)
   05/01/24 0600  Psych Admission Type (Psych Patients Only)  Admission Status Involuntary  Psychosocial Assessment  Patient Complaints Irritability  Eye Contact Fair  Facial Expression Flat  Affect Angry  Speech Soft  Interaction Assertive  Motor Activity Slow  Appearance/Hygiene In scrubs  Behavior Characteristics Cooperative  Mood Depressed;Sad  Thought Process  Coherency WDL  Content WDL  Delusions None reported or observed  Perception WDL  Hallucination None reported or observed  Judgment Impaired  Confusion WDL  Danger to Self  Current suicidal ideation? Denies  Self-Injurious Behavior No self-injurious ideation or behavior indicators observed or expressed   Agreement Not to Harm Self Yes  Description of Agreement verbal   No distress noted, alert and oriented x 4, thoughts are clear,  affect is congruent with mood, he denies SI/HI/AVH will continue to monitor.

## 2024-05-02 ENCOUNTER — Inpatient Hospital Stay

## 2024-05-02 DIAGNOSIS — E669 Obesity, unspecified: Secondary | ICD-10-CM

## 2024-05-02 DIAGNOSIS — F332 Major depressive disorder, recurrent severe without psychotic features: Secondary | ICD-10-CM

## 2024-05-02 DIAGNOSIS — K625 Hemorrhage of anus and rectum: Secondary | ICD-10-CM | POA: Diagnosis not present

## 2024-05-02 DIAGNOSIS — K5790 Diverticulosis of intestine, part unspecified, without perforation or abscess without bleeding: Secondary | ICD-10-CM

## 2024-05-02 DIAGNOSIS — F1024 Alcohol dependence with alcohol-induced mood disorder: Secondary | ICD-10-CM

## 2024-05-02 DIAGNOSIS — F39 Unspecified mood [affective] disorder: Secondary | ICD-10-CM | POA: Diagnosis not present

## 2024-05-02 DIAGNOSIS — K5720 Diverticulitis of large intestine with perforation and abscess without bleeding: Secondary | ICD-10-CM | POA: Diagnosis not present

## 2024-05-02 DIAGNOSIS — I10 Essential (primary) hypertension: Secondary | ICD-10-CM

## 2024-05-02 DIAGNOSIS — M25571 Pain in right ankle and joints of right foot: Secondary | ICD-10-CM

## 2024-05-02 MED ORDER — TRAZODONE HCL 100 MG PO TABS
100.0000 mg | ORAL_TABLET | Freq: Every evening | ORAL | Status: DC | PRN
  Administered 2024-05-02: 100 mg via ORAL
  Filled 2024-05-02: qty 1

## 2024-05-02 MED ORDER — PANTOPRAZOLE SODIUM 40 MG PO TBEC
40.0000 mg | DELAYED_RELEASE_TABLET | Freq: Every day | ORAL | Status: DC
  Administered 2024-05-02 – 2024-05-03 (×2): 40 mg via ORAL
  Filled 2024-05-02 (×2): qty 1

## 2024-05-02 MED ORDER — IOHEXOL 300 MG/ML  SOLN
100.0000 mL | Freq: Once | INTRAMUSCULAR | Status: AC | PRN
  Administered 2024-05-02: 100 mL via INTRAVENOUS

## 2024-05-02 MED ORDER — IOHEXOL 9 MG/ML PO SOLN
500.0000 mL | ORAL | Status: AC
  Administered 2024-05-02 (×2): 500 mL via ORAL

## 2024-05-02 NOTE — BH IP Treatment Plan (Signed)
 Interdisciplinary Treatment and Diagnostic Plan Update  05/02/2024 Time of Session: 10:09 AM Christian Dickerson MRN: [id number]  Principal Diagnosis: Rectal bleeding  Secondary Diagnoses: Principal Problem:   Rectal bleeding Active Problems:   Diverticulitis of colon with perforation   Alcohol dependence with alcohol-induced mood disorder (HCC)   Major depressive disorder, recurrent severe without psychotic features (HCC)   Mood disorder   Blood in stool   Diverticulosis   Essential hypertension   Obesity (BMI 30-39.9)   Current Medications:  Current Facility-Administered Medications  Medication Dose Route Frequency Provider Last Rate Last Admin   acetaminophen  (TYLENOL ) tablet 650 mg  650 mg Oral Q6H PRN Bobbitt, Shalon E, NP   650 mg at 05/01/24 0818   alum & mag hydroxide-simeth (MAALOX/MYLANTA) 200-200-20 MG/5ML suspension 30 mL  30 mL Oral Q4H PRN Bobbitt, Shalon E, NP       amLODipine  (NORVASC ) tablet 5 mg  5 mg Oral Daily Bobbitt, Shalon E, NP   5 mg at 05/02/24 0816   ARIPiprazole (ABILIFY) tablet 5 mg  5 mg Oral QHS Bobbitt, Shalon E, NP   5 mg at 05/01/24 2128   chlordiazePOXIDE  (LIBRIUM ) capsule 25 mg  25 mg Oral BH-qamhs May, Tanya, NP       Followed by   NOREEN ON 05/04/2024] chlordiazePOXIDE  (LIBRIUM ) capsule 25 mg  25 mg Oral Daily May, Tanya, NP       haloperidol (HALDOL) tablet 5 mg  5 mg Oral TID PRN Bobbitt, Shalon E, NP       And   diphenhydrAMINE  (BENADRYL ) capsule 50 mg  50 mg Oral TID PRN Bobbitt, Shalon E, NP       haloperidol lactate (HALDOL) injection 10 mg  10 mg Intramuscular TID PRN Ajibola, Ene A, NP       And   diphenhydrAMINE  (BENADRYL ) injection 50 mg  50 mg Intramuscular TID PRN Ajibola, Ene A, NP       And   LORazepam  (ATIVAN ) injection 2 mg  2 mg Intramuscular TID PRN Ajibola, Ene A, NP       haloperidol lactate (HALDOL) injection 5 mg  5 mg Intramuscular TID PRN Ajibola, Ene A, NP       And   diphenhydrAMINE  (BENADRYL ) injection 50 mg  50 mg  Intramuscular TID PRN Ajibola, Ene A, NP       And   LORazepam  (ATIVAN ) injection 2 mg  2 mg Intramuscular TID PRN Ajibola, Ene A, NP       FLUoxetine (PROZAC) capsule 10 mg  10 mg Oral Daily Bobbitt, Shalon E, NP   10 mg at 05/02/24 0817   folic acid  (FOLVITE ) tablet 1 mg  1 mg Oral Daily Bobbitt, Shalon E, NP   1 mg at 05/02/24 0817   gabapentin (NEURONTIN) capsule 300 mg  300 mg Oral TID Bobbitt, Shalon E, NP   300 mg at 05/02/24 0817   hydrOXYzine (ATARAX) tablet 25 mg  25 mg Oral Q6H PRN Bobbitt, Shalon E, NP       loperamide (IMODIUM) capsule 2-4 mg  2-4 mg Oral PRN Bobbitt, Shalon E, NP       magnesium hydroxide (MILK OF MAGNESIA) suspension 30 mL  30 mL Oral Daily PRN Ajibola, Ene A, NP       multivitamin with minerals tablet 1 tablet  1 tablet Oral Daily Bobbitt, Shalon E, NP   1 tablet at 05/02/24 0816   nicotine  (NICODERM CQ  - dosed in mg/24 hours) patch 14 mg  14 mg Transdermal Daily Jadapalle, Sree, MD   14 mg at 05/02/24 9182   ondansetron  (ZOFRAN -ODT) disintegrating tablet 4 mg  4 mg Oral Q6H PRN Bobbitt, Shalon E, NP       pantoprazole  (PROTONIX ) EC tablet 40 mg  40 mg Oral Daily Wieting, Richard, MD       thiamine  (VITAMIN B1) tablet 100 mg  100 mg Oral Daily Bobbitt, Shalon E, NP   100 mg at 05/02/24 0817   traZODone (DESYREL) tablet 50 mg  50 mg Oral QHS PRN Bobbitt, Shalon E, NP   50 mg at 05/01/24 2128   PTA Medications: Medications Prior to Admission  Medication Sig Dispense Refill Last Dose/Taking   amLODipine  (NORVASC ) 5 MG tablet Take 1 tablet (5 mg total) by mouth daily. 30 tablet 0    folic acid  (FOLVITE ) 1 MG tablet Take 1 tablet (1 mg total) by mouth daily. (Patient not taking: Reported on 04/29/2024) 30 tablet 0    Multiple Vitamin (MULTIVITAMIN WITH MINERALS) TABS tablet Take 1 tablet by mouth daily. (Patient not taking: Reported on 04/29/2024) 30 tablet 0     Patient Stressors:    Patient Strengths:    Treatment Modalities: Medication Management, Group  therapy, Case management,  1 to 1 session with clinician, Psychoeducation, Recreational therapy.   Physician Treatment Plan for Primary Diagnosis: Rectal bleeding Long Term Goal(s): Improvement in symptoms so as ready for discharge   Short Term Goals: Ability to identify changes in lifestyle to reduce recurrence of condition will improve Ability to disclose and discuss suicidal ideas Ability to identify and develop effective coping behaviors will improve Ability to identify triggers associated with substance abuse/mental health issues will improve  Medication Management: Evaluate patient's response, side effects, and tolerance of medication regimen.  Therapeutic Interventions: 1 to 1 sessions, Unit Group sessions and Medication administration.  Evaluation of Outcomes: Not Met  Physician Treatment Plan for Secondary Diagnosis: Principal Problem:   Rectal bleeding Active Problems:   Diverticulitis of colon with perforation   Alcohol dependence with alcohol-induced mood disorder (HCC)   Major depressive disorder, recurrent severe without psychotic features (HCC)   Mood disorder   Blood in stool   Diverticulosis   Essential hypertension   Obesity (BMI 30-39.9)  Long Term Goal(s): Improvement in symptoms so as ready for discharge   Short Term Goals: Ability to identify changes in lifestyle to reduce recurrence of condition will improve Ability to disclose and discuss suicidal ideas Ability to identify and develop effective coping behaviors will improve Ability to identify triggers associated with substance abuse/mental health issues will improve     Medication Management: Evaluate patient's response, side effects, and tolerance of medication regimen.  Therapeutic Interventions: 1 to 1 sessions, Unit Group sessions and Medication administration.  Evaluation of Outcomes: Not Met   RN Treatment Plan for Primary Diagnosis: Rectal bleeding Long Term Goal(s): Knowledge of disease  and therapeutic regimen to maintain health will improve  Short Term Goals: Ability to verbalize frustration and anger appropriately will improve, Ability to demonstrate self-control, Ability to participate in decision making will improve, Ability to verbalize feelings will improve, Ability to disclose and discuss suicidal ideas, and Ability to identify and develop effective coping behaviors will improve  Medication Management: RN will administer medications as ordered by provider, will assess and evaluate patient's response and provide education to patient for prescribed medication. RN will report any adverse and/or side effects to prescribing provider.  Therapeutic Interventions: 1 on 1 counseling sessions, Psychoeducation, Medication  administration, Evaluate responses to treatment, Monitor vital signs and CBGs as ordered, Perform/monitor CIWA, COWS, AIMS and Fall Risk screenings as ordered, Perform wound care treatments as ordered.  Evaluation of Outcomes: Not Met   LCSW Treatment Plan for Primary Diagnosis: Rectal bleeding Long Term Goal(s): Safe transition to appropriate next level of care at discharge, Engage patient in therapeutic group addressing interpersonal concerns.  Short Term Goals: Engage patient in aftercare planning with referrals and resources, Increase social support, Increase ability to appropriately verbalize feelings, Increase emotional regulation, Facilitate acceptance of mental health diagnosis and concerns, Facilitate patient progression through stages of change regarding substance use diagnoses and concerns, Identify triggers associated with mental health/substance abuse issues, and Increase skills for wellness and recovery  Therapeutic Interventions: Assess for all discharge needs, 1 to 1 time with Social worker, Explore available resources and support systems, Assess for adequacy in community support network, Educate family and significant other(s) on suicide prevention,  Complete Psychosocial Assessment, Interpersonal group therapy.  Evaluation of Outcomes: Not Met   Progress in Treatment: Attending groups: Yes. Participating in groups: Yes. Taking medication as prescribed: Yes. Toleration medication: Yes. Family/Significant other contact made: Yes, individual(s) contacted:  Earnie Forbes, [phone], sister Patient understands diagnosis: Yes. Discussing patient identified problems/goals with staff: Yes. Medical problems stabilized or resolved: Yes. and No. Denies suicidal/homicidal ideation: Yes. Issues/concerns per patient self-inventory: No. Other: None  New problem(s) identified: No, Describe:  None  New Short Term/Long Term Goal(s):detox, elimination of symptoms of psychosis, medication management for mood stabilization; elimination of SI thoughts; development of comprehensive mental wellness/sobriety plan.    Patient Goals:  I don't know.  Discharge Plan or Barriers: CSW to assist with the development of appropriate discharge plan.    Reason for Continuation of Hospitalization: Anxiety Delusions  Depression Suicidal ideation Withdrawal symptoms  Estimated Length of Stay: 1-7 days.  Last 3 [place] Suicide Severity Risk Score: Flowsheet Row Admission (Current) from 04/30/2024 in [facility]PATIENT BE[facility]AL MEDICINE ED from 04/29/2024 in [facility] Emergency Department at [facility] Admission (Discharged) from 04/09/2024 in [facility] INPATIENT BEHAVIORAL MEDICINE  C-SSRS RISK CATEGORY High Risk High Risk No Risk    Last PHQ 2/9 Scores:     No data to display          Scribe for Treatment Team: Nashea Chumney M Damary Doland, LCSW 05/02/2024 10:42 AM

## 2024-05-02 NOTE — Progress Notes (Signed)
 Patient refusing evening vital signs and dinner.

## 2024-05-02 NOTE — Group Note (Signed)
 [facility] LCSW Group Therapy Note   Group Date: 05/02/2024 Start Time: 1245 End Time: 1352   Type of Therapy/Topic:  Group Therapy:  Balance in Life  Participation Level:  Did Not Attend   Description of Group:    This group will address the concept of balance and how it feels and looks when one is unbalanced. Patients will be encouraged to process areas in their lives that are out of balance, and identify reasons for remaining unbalanced. Facilitators will guide patients utilizing problem- solving interventions to address and correct the stressor making their life unbalanced. Understanding and applying boundaries will be explored and addressed for obtaining  and maintaining a balanced life. Patients will be encouraged to explore ways to assertively make their unbalanced needs known to significant others in their lives, using other group members and facilitator for support and feedback.  Therapeutic Goals: Patient will identify two or more emotions or situations they have that consume much of in their lives. Patient will identify signs/triggers that life has become out of balance:  Patient will identify two ways to set boundaries in order to achieve balance in their lives:  Patient will demonstrate ability to communicate their needs through discussion and/or role plays  Summary of Patient Progress:    Patient did not attend.     Therapeutic Modalities:   Cognitive Behavioral Therapy Solution-Focused Therapy Assertiveness Training   Alveta CHRISTELLA Kerns, LCSW

## 2024-05-02 NOTE — Progress Notes (Signed)
 Progress Note   Patient: Christian Dickerson FMW:[id number] DOB: 1986/02/07 DOA: 04/30/2024     2 DOS: the patient was seen and examined on 05/02/2024   Brief hospital course: 38 year old man admitted to the psychiatry service for alcohol abuse and mood disorder.  Patient has a recent history of diverticular abscess that required drainage tube and antibiotics.  Was discharged from hospital on 04/03/2024 on oral antibiotics.  Patient states that he has been having some bleeding as outpatient going on for about a week.  Seeing some bright red blood but also some dark stool.  10/27.  Case discussed with Dr. Unk gastroenterology and will repeat a CT scan of the abdomen and pelvis to make sure abscess was fully treated.  She will see in consultation.  Hemoglobin stable.  Assessment and Plan: * Rectal bleeding Patient's last episode of rectal bleeding was last night at 9 PM.  Stated that was bright red blood floating on the surface and darker stool.  Will discontinue Naprosyn .  Placed on Protonix .  Will get a CT scan of the abdomen to make sure diverticulitis is fully treated.  Case discussed with gastroenterology and she will see in consultation.  Diverticulitis of colon with perforation CT scan to make sure this has been fully treated.  Discharged from the hospital on 04/03/24 on oral antibiotics.  Obesity (BMI 30-39.9) BMI 31.86.  Essential hypertension On Norvasc   Mood disorder Continue psychiatric medications as per psychiatric team.  Alcohol dependence with alcohol-induced mood disorder (HCC) On a Librium  taper        Subjective: Patient has had some rectal bleeding going on for over a week.  Last episode last night around 9 PM.  Had bright red blood with dark stool.  Recent hospitalization for diverticulitis of the colon with perforation requiring drainage tube.  Physical Exam: Vitals:   05/01/24 0818 05/01/24 1616 05/02/24 0812 05/02/24 0816  BP: 121/81 130/80 (!) 142/83 (!)  142/83  Pulse:  82 82   Resp:      Temp:  98.7 F (37.1 C) 97.6 F (36.4 C)   TempSrc:  Oral Oral   SpO2:  98% 100%   Weight:      Height:       Physical Exam HENT:     Head: Normocephalic.  Eyes:     General: Lids are normal.     Conjunctiva/sclera: Conjunctivae normal.  Cardiovascular:     Rate and Rhythm: Normal rate and regular rhythm.     Heart sounds: Normal heart sounds, S1 normal and S2 normal.  Pulmonary:     Breath sounds: No decreased breath sounds, wheezing, rhonchi or rales.  Abdominal:     Palpations: Abdomen is soft.     Tenderness: There is abdominal tenderness in the left lower quadrant.  Musculoskeletal:     Right lower leg: No swelling.     Left lower leg: No swelling.  Skin:    General: Skin is warm.     Findings: No rash.  Neurological:     Mental Status: He is alert and oriented to person, place, and time.     Data Reviewed: Electrolytes normal range, creatinine 0.58, liver function test normal range, last hemoglobin 13.8, white blood cell count 7.8, platelet count 180    Disposition: As per psychiatry team.  While here will get a CT scan of the abdomen and pelvis and GI consultation.  Planned Discharge Destination: Patient is homeless    Time spent: 30 minutes Case discussed with  gastroenterology and nursing staff.  Author: Charlie Patterson, MD 05/02/2024 10:24 AM  For on call review [email or web address].

## 2024-05-02 NOTE — Consult Note (Signed)
 PODIATRY CONSULTATION  NAME Christian Dickerson MRN [id number] DOB 1986/02/23 DOA 04/30/2024   Reason for consult: R ankle pain after fall  Attending/Consulting physician: CANDIE Foil MD  History of present illness: 38 year old man admitted to the psychiatry service for alcohol abuse and mood disorder. Patient has a recent history of diverticular abscess that required drainage tube and antibiotics. Was discharged from hospital on 04/03/2024 on oral antibiotics.  Pt reports prior to this admission had a fall and had some swelling in right ankle. Has some pain but can walk on right foot. History of prior ankle fusion for pilon fracture in 2016. Has known broken hardware from years ago. Had discussed removal with his orthopedic surgeon who performed surgery but was felt it would not improve his symptoms / not indicated.   Past Medical History:  Diagnosis Date   Alcohol abuse    Diverticulitis    Substance abuse (HCC)        Latest Ref Rng & Units 05/01/2024    8:48 PM 04/29/2024    9:35 PM 04/08/2024   10:15 PM  CBC  WBC 4.0 - 10.5 K/uL 7.8  8.6  8.9   Hemoglobin 13.0 - 17.0 g/dL 86.1  86.7  83.9   Hematocrit 39.0 - 52.0 % 40.2  38.9  46.3   Platelets 150 - 400 K/uL 180  211  199        Latest Ref Rng & Units 05/01/2024   10:22 AM 04/29/2024    9:35 PM 04/11/2024    8:41 AM  BMP  Glucose 70 - 99 mg/dL 897  92  99   BUN 6 - 20 mg/dL 9  10  12    Creatinine 0.61 - 1.24 mg/dL 9.41  9.46  9.20   Sodium 135 - 145 mmol/L 139  137  139   Potassium 3.5 - 5.1 mmol/L 4.3  3.0  4.3   Chloride 98 - 111 mmol/L 103  100  104   CO2 22 - 32 mmol/L 24  20  23    Calcium 8.9 - 10.3 mg/dL 9.3  8.6  9.4       Physical Exam: Lower Extremity Exam  R ankle with healed scar anterior  Absent ankle df/pf. Some midfoot motion  No erythema no evidence infeciton  Edema R ankle,     ASSESSMENT/PLAN OF CARE 38 y.o. male with PMHx significant for  alcohol abuse and mood disorder prior R  ankle fusion with edema of right ankle after fall. No evidence of new fracture. Old broken orthopedic hardware. No evidence of infection.    - Recommend WBAT in CAM boot to RLE. Edema control with elevation / ace wrap or compression stocking as allowed in psych unit. Defer to them what is appropriate. 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  - Anticoagulation: per primary - Wound care: none required  - WB status: WBAT in cam boot, ordered - Will sign off please message with questions    Thank you for the consult.  Please contact me directly with any questions or concerns.           Marolyn JULIANNA Honour, DPM [facility]    [address]  [place], [place] [address]                Office [phone]  Fax [phone]

## 2024-05-02 NOTE — Plan of Care (Signed)
  Problem: Education: Goal: Emotional status will improve Outcome: Progressing Goal: Mental status will improve Outcome: Progressing   Problem: Coping: Goal: Ability to verbalize frustrations and anger appropriately will improve Outcome: Progressing Goal: Ability to demonstrate self-control will improve Outcome: Progressing   

## 2024-05-02 NOTE — Progress Notes (Signed)
 The cam boot was obtained by the writer from supply, and the provider was informed of the patient's situation. Due to the patient's previous behaviors with the IV, nursing staff expressed concerns regarding the weight of the boot, highlighting the potential risk of harm to the patient or others. The provider acknowledged these concerns and agreed that patient behavior would continue to be monitored throughout the night. If the patient remains safe and appropriate on the unit, the use of the boot will be re-evaluated the following day.  In response to this decision, the patient expressed frustration and became upset, indicating an intent to refuse all labs, vital signs, meals, and medications moving forward. Staff will continue to encourage the patient to engage in necessary treatments, including eating meals and taking medications, while ensuring that appropriate interventions are in place to maintain safety and promote cooperation.

## 2024-05-02 NOTE — Group Note (Signed)
 Date:  05/02/2024 Time:  10:37 AM  Group Topic/Focus:  Healthy Communication:   The focus of this group is to discuss communication, barriers to communication, as well as healthy ways to communicate with others.    Participation Level:  Active  Participation Quality:  Appropriate  Affect:  Appropriate  Cognitive:  Appropriate  Insight: Appropriate  Engagement in Group:  Engaged  Modes of Intervention:  Activity  Additional Comments:    Camellia HERO Rowen Wilmer 05/02/2024, 10:37 AM

## 2024-05-02 NOTE — Progress Notes (Signed)
 Patient is alert and oriented. He expressed regret about threatening to remove his IV earlier. He was just frustrated with the swelling in his ankle and how long it was taking for him to be called for the CT. He states that he apologizes and plans to be cooperative for remainder of his stay. He currently denies SI, HI and AVH.   05/02/24 1959  Psych Admission Type (Psych Patients Only)  Admission Status Involuntary  Psychosocial Assessment  Patient Complaints None  Eye Contact Fair  Facial Expression Flat  Affect Sad  Speech Logical/coherent  Interaction Assertive  Motor Activity Slow  Appearance/Hygiene Unremarkable  Behavior Characteristics Cooperative  Mood Depressed  Aggressive Behavior  Effect No apparent injury  Thought Process  Coherency WDL  Content WDL  Delusions None reported or observed  Perception WDL  Hallucination None reported or observed  Judgment Impaired  Confusion WDL  Danger to Self  Current suicidal ideation? Denies  Agreement Not to Harm Self Yes  Description of Agreement verbal  Danger to Others  Danger to Others None reported or observed

## 2024-05-02 NOTE — Group Note (Signed)
 Recreation Therapy Group Note   Group Topic:Healthy Support Systems  Group Date: 05/02/2024 Start Time: 1030 End Time: 1130 Facilitators: Celestia Jeoffrey BRAVO, LRT, CTRS Location: Craft Room  Group Description: Straw Bridge. In groups or individually, patients were given 10 plastic drinking straws and an equal length of masking tape. Using the materials provided, patients were instructed to build a free-standing bridge-like structure to suspend an everyday item (ex: deck of cards) off the floor or table surface. All materials were required to be used in secondary school teacher. LRT facilitated post-activity discussion reviewing the importance of having strong and healthy support systems in our lives. LRT discussed how the people in our lives serve as the tape and the deck of cards we placed on top of our straw structure are the stressors we face in daily life. LRT and pts discussed what happens in our life when things get too heavy for us , and we don't have strong supports outside of the hospital. Pt shared 2 of their healthy supports in their life aloud in the group.   Goal Area(s) Addressed:  Patient will identify 2 healthy supports in their life. Patient will identify skills to successfully complete activity. Patient will identify correlation of this activity to life post-discharge.  Patient will build on frustration tolerance skills. Patient will increase team building and communication skills.    Affect/Mood: N/A   Participation Level: Did not attend    Clinical Observations/Individualized Feedback: Patient did not attend group.   Plan: Continue to engage patient in RT group sessions 2-3x/week.   Jeoffrey BRAVO Celestia, LRT, CTRS 05/02/2024 1:08 PM

## 2024-05-02 NOTE — Assessment & Plan Note (Signed)
 Patient's last episode of rectal bleeding was last night at 9 PM.  Stated that was bright red blood floating on the surface and darker stool.  Will discontinue Naprosyn .  Placed on Protonix .  Will get a CT scan of the abdomen to make sure diverticulitis is fully treated.  Case discussed with gastroenterology and she will see in consultation.

## 2024-05-02 NOTE — Progress Notes (Signed)
 [facility] MD Progress Note  05/02/2024 1:03 PM Christian Dickerson  MRN:  [id number]   Subjective:  Chart reviewed, case discussed in multidisciplinary meeting, patient seen during rounds.   Patient was seen on rounds today by psychiatry team.  When asked about goals during treatment team patient stated I do not know.  Patient reported he was possibly accepted to the treatment center of [place].  He stated he was supposed to go last Saturday, but ended up being admitted to the inpatient psych unit.  Hospitalist is currently involved due to patient condition of ankle from previous injury and reports of swelling/pain.  Patient reported the orthopedic doctor rounded on him today on the inpatient unit and recommended a boot, orthopedic MD placed order for patient to have boot while on the unit.  Patient currently denying suicidal or homicidal ideations.  He denied any auditory or visual hallucinations today.  Patient reported currently eating well.  Patient did endorse poor sleep.  We discussed increasing his nightly trazodone dose, to which patient was agreeable to.  Patient reported tolerating all other medications well, and denied any known side effects at this time.  Patient reported he is currently working with our social work team about placement at the treatment center of [employer]     Sleep: Poor  Appetite:  Fair  Past Psychiatric History: see h&P Family History: History reviewed. No pertinent family history. Social History:  Social History   Substance and Sexual Activity  Alcohol Use Yes   Alcohol/week: 35.0 standard drinks of alcohol   Types: 35 Shots of liquor per week   Comment: 2 fifths a day     Social History   Substance and Sexual Activity  Drug Use Yes   Types: Marijuana    Social History   Socioeconomic History   Marital status: Single    Spouse name: Not on file   Number of children: Not on file   Years of education: Not on file   Highest education level: Not on file   Occupational History   Not on file  Tobacco Use   Smoking status: Every Day    Current packs/day: 1.00    Average packs/day: 1 pack/day for 1.2 years (1.2 ttl pk-yrs)    Types: Cigarettes    Start date: 03/08/2023    Passive exposure: Current   Smokeless tobacco: Never  Substance and Sexual Activity   Alcohol use: Yes    Alcohol/week: 35.0 standard drinks of alcohol    Types: 35 Shots of liquor per week    Comment: 2 fifths a day   Drug use: Yes    Types: Marijuana   Sexual activity: Not Currently  Other Topics Concern   Not on file  Social History Narrative   Not on file   Social Drivers of Health   Financial Resource Strain: Not on file  Food Insecurity: Food Insecurity Present (04/30/2024)   Hunger Vital Sign    Worried About Running Out of Food in the Last Year: Often true    Ran Out of Food in the Last Year: Often true  Transportation Needs: Patient Declined (04/30/2024)   PRAPARE - Administrator, Civil Service (Medical): Patient declined    Lack of Transportation (Non-Medical): Patient declined  Recent Concern: Transportation Needs - Unmet Transportation Needs (04/09/2024)   PRAPARE - Administrator, Civil Service (Medical): Yes    Lack of Transportation (Non-Medical): Yes  Physical Activity: Not on file  Stress: Not on  file  Social Connections: Socially Isolated (03/31/2024)   Social Connection and Isolation Panel    Frequency of Communication with Friends and Family: Once a week    Frequency of Social Gatherings with Friends and Family: Never    Attends Religious Services: Never    Diplomatic Services Operational Officer: No    Attends Engineer, Structural: Never    Marital Status: Never married   Past Medical History:  Past Medical History:  Diagnosis Date   Alcohol abuse    Diverticulitis    Substance abuse (HCC)     Past Surgical History:  Procedure Laterality Date   ANKLE SURGERY     CHEST TUBE INSERTION      LITHOTRIPSY     TIBIA FRACTURE SURGERY     TONSILLECTOMY AND ADENOIDECTOMY      Current Medications: Current Facility-Administered Medications  Medication Dose Route Frequency Provider Last Rate Last Admin   acetaminophen  (TYLENOL ) tablet 650 mg  650 mg Oral Q6H PRN Bobbitt, Shalon E, NP   650 mg at 05/01/24 0818   alum & mag hydroxide-simeth (MAALOX/MYLANTA) 200-200-20 MG/5ML suspension 30 mL  30 mL Oral Q4H PRN Bobbitt, Shalon E, NP       amLODipine  (NORVASC ) tablet 5 mg  5 mg Oral Daily Bobbitt, Shalon E, NP   5 mg at 05/02/24 0816   ARIPiprazole (ABILIFY) tablet 5 mg  5 mg Oral QHS Bobbitt, Shalon E, NP   5 mg at 05/01/24 2128   chlordiazePOXIDE  (LIBRIUM ) capsule 25 mg  25 mg Oral BH-qamhs May, Tanya, NP       Followed by   NOREEN ON 05/04/2024] chlordiazePOXIDE  (LIBRIUM ) capsule 25 mg  25 mg Oral Daily May, Tanya, NP       haloperidol (HALDOL) tablet 5 mg  5 mg Oral TID PRN Bobbitt, Shalon E, NP       And   diphenhydrAMINE  (BENADRYL ) capsule 50 mg  50 mg Oral TID PRN Bobbitt, Shalon E, NP       haloperidol lactate (HALDOL) injection 10 mg  10 mg Intramuscular TID PRN Ajibola, Ene A, NP       And   diphenhydrAMINE  (BENADRYL ) injection 50 mg  50 mg Intramuscular TID PRN Ajibola, Ene A, NP       And   LORazepam  (ATIVAN ) injection 2 mg  2 mg Intramuscular TID PRN Ajibola, Ene A, NP       haloperidol lactate (HALDOL) injection 5 mg  5 mg Intramuscular TID PRN Ajibola, Ene A, NP       And   diphenhydrAMINE  (BENADRYL ) injection 50 mg  50 mg Intramuscular TID PRN Ajibola, Ene A, NP       And   LORazepam  (ATIVAN ) injection 2 mg  2 mg Intramuscular TID PRN Ajibola, Ene A, NP       FLUoxetine (PROZAC) capsule 10 mg  10 mg Oral Daily Bobbitt, Shalon E, NP   10 mg at 05/02/24 0817   folic acid  (FOLVITE ) tablet 1 mg  1 mg Oral Daily Bobbitt, Shalon E, NP   1 mg at 05/02/24 0817   gabapentin (NEURONTIN) capsule 300 mg  300 mg Oral TID Bobbitt, Shalon E, NP   300 mg at 05/02/24 1110   hydrOXYzine  (ATARAX) tablet 25 mg  25 mg Oral Q6H PRN Bobbitt, Shalon E, NP       loperamide (IMODIUM) capsule 2-4 mg  2-4 mg Oral PRN Bobbitt, Shalon E, NP  magnesium hydroxide (MILK OF MAGNESIA) suspension 30 mL  30 mL Oral Daily PRN Ajibola, Ene A, NP       multivitamin with minerals tablet 1 tablet  1 tablet Oral Daily Bobbitt, Shalon E, NP   1 tablet at 05/02/24 0816   nicotine  (NICODERM CQ  - dosed in mg/24 hours) patch 14 mg  14 mg Transdermal Daily Jadapalle, Sree, MD   14 mg at 05/02/24 9182   ondansetron  (ZOFRAN -ODT) disintegrating tablet 4 mg  4 mg Oral Q6H PRN Bobbitt, Shalon E, NP       pantoprazole  (PROTONIX ) EC tablet 40 mg  40 mg Oral Daily Josette Ade, MD   40 mg at 05/02/24 1110   thiamine  (VITAMIN B1) tablet 100 mg  100 mg Oral Daily Bobbitt, Shalon E, NP   100 mg at 05/02/24 0817   traZODone (DESYREL) tablet 50 mg  50 mg Oral QHS PRN Bobbitt, Shalon E, NP   50 mg at 05/01/24 2128    Lab Results:  Results for orders placed or performed during the hospital encounter of 04/30/24 (from the past 48 hours)  Comprehensive metabolic panel     Status: Abnormal   Collection Time: 05/01/24 10:22 AM  Result Value Ref Range   Sodium 139 135 - 145 mmol/L   Potassium 4.3 3.5 - 5.1 mmol/L   Chloride 103 98 - 111 mmol/L   CO2 24 22 - 32 mmol/L   Glucose, Bld 102 (H) 70 - 99 mg/dL    Comment: Glucose reference range applies only to samples taken after fasting for at least 8 hours.   BUN 9 6 - 20 mg/dL   Creatinine, Ser 9.41 (L) 0.61 - 1.24 mg/dL   Calcium 9.3 8.9 - 89.6 mg/dL   Total Protein 7.8 6.5 - 8.1 g/dL   Albumin 3.6 3.5 - 5.0 g/dL   AST 39 15 - 41 U/L   ALT 35 0 - 44 U/L   Alkaline Phosphatase 103 38 - 126 U/L   Total Bilirubin 0.9 0.0 - 1.2 mg/dL   GFR, Estimated >39 >39 mL/min    Comment: (NOTE) Calculated using the CKD-EPI Creatinine Equation (2021)    Anion gap 12 5 - 15    Comment: Performed at [facility], [address] [place], [place] [address]  CBC      Status: Abnormal   Collection Time: 05/01/24  8:48 PM  Result Value Ref Range   WBC 7.8 4.0 - 10.5 K/uL   RBC 3.92 (L) 4.22 - 5.81 MIL/uL   Hemoglobin 13.8 13.0 - 17.0 g/dL   HCT 59.7 60.9 - 47.9 %   MCV 102.6 (H) 80.0 - 100.0 fL   MCH 35.2 (H) 26.0 - 34.0 pg   MCHC 34.3 30.0 - 36.0 g/dL   RDW 86.5 88.4 - 84.4 %   Platelets 180 150 - 400 K/uL   nRBC 0.0 0.0 - 0.2 %    Comment: Performed at [facility], [address] [place], [place] [address]    Blood Alcohol level:  Lab Results  Component Value Date   ETH 65 (H) 04/29/2024   ETH 354 (HH) 04/08/2024    Metabolic Disorder Labs: Lab Results  Component Value Date   HGBA1C 5.2 04/29/2024   MPG 102.54 04/29/2024   No results found for: PROLACTIN Lab Results  Component Value Date   CHOL 158 04/29/2024   TRIG 192 (H) 04/29/2024   HDL 42 04/29/2024   CHOLHDL 3.8 04/29/2024   VLDL 38  04/29/2024   LDLCALC 78 04/29/2024    Physical Findings: AIMS:  , ,  ,  ,    CIWA:  CIWA-Ar Total: 0 COWS:      Psychiatric Specialty Exam:  Presentation  General Appearance:  Appropriate for Environment  Eye Contact: Good  Speech: Clear and Coherent  Speech Volume: Normal    Mood and Affect  Mood: Euthymic  Affect: Appropriate   Thought Process  Thought Processes: Coherent; Linear  Orientation:Full (Time, Place and Person)  Thought Content:Logical  Hallucinations:Hallucinations: None  Ideas of Reference:None  Suicidal Thoughts:Suicidal Thoughts: No  Homicidal Thoughts:Homicidal Thoughts: No   Sensorium  Memory: Immediate Fair; Recent Fair; Remote Fair  Judgment: Poor  Insight: Poor   Executive Functions  Concentration: Fair  Attention Span: Fair  Recall: Fair  Fund of Knowledge: Fair  Language: Fair   Psychomotor Activity  Psychomotor Activity: Psychomotor Activity: Normal  Musculoskeletal: Strength & Muscle Tone: within normal limits Gait & Station: Impaired  due to injury Assets  Assets: Manufacturing Systems Engineer; Desire for Improvement; Social Support    Physical Exam: Physical Exam Pulmonary:     Effort: Pulmonary effort is normal.  Musculoskeletal:        General: Swelling and signs of injury present.     Comments: Ankle   Neurological:     Mental Status: He is alert and oriented to person, place, and time.    Review of Systems  Constitutional:  Negative for fever.  Respiratory:  Negative for shortness of breath.   Cardiovascular:  Negative for chest pain.  Gastrointestinal:  Negative for abdominal pain, nausea and vomiting.  Psychiatric/Behavioral:  Positive for substance abuse. Negative for hallucinations and suicidal ideas. The patient has insomnia.    Blood pressure (!) 142/83, pulse 82, temperature 97.6 F (36.4 C), temperature source Oral, resp. rate 20, height 6' 1 (1.854 m), weight 109.5 kg, SpO2 100%. Body mass index is 31.86 kg/m.  Diagnosis: Principal Problem:   Rectal bleeding Active Problems:   Diverticulitis of colon with perforation   Alcohol dependence with alcohol-induced mood disorder (HCC)   Major depressive disorder, recurrent severe without psychotic features (HCC)   Mood disorder   Blood in stool   Diverticulosis   Essential hypertension   Obesity (BMI 30-39.9)   Acute right ankle pain   PLAN: Safety and Monitoring:  -- Voluntary admission to inpatient psychiatric unit for safety, stabilization and treatment  -- Daily contact with patient to assess and evaluate symptoms and progress in treatment  -- Patient's case to be discussed in multi-disciplinary team meeting  -- Observation Level : q15 minute checks  -- Vital signs:  q12 hours  -- Precautions: suicide, elopement, and assault -- Encouraged patient to participate in unit milieu and in scheduled group therapies  2. Psychiatric Treatment:  Scheduled Medications: Mood Disorder   Abilify 5 mg at bedtime      Fluoxetine 10 mg Folic acid  1  mg Gabapentin 300 mg TID Multivitamin daily Nicotine  patch 14mg  Thiamine  tablet 100 mg    -- The risks/benefits/side-effects/alternatives to this medication were discussed in detail with the patient and time was given for questions. The patient consents to medication trial.  3. Medical Issues Being Addressed:  Leg pain/swelling - Xrays in ED insignificant; continue PRN medication for pain Naproxen  250 mg BID for leg pain (podiatry recommends starting with NSAIDS) Appreciate Ortho and Podiatry consult. Podiatry (Dr.Semen) reviewed case and suggested CT scan along with walking boot (if allowed on psych unit)- patient awaiting to  get CT scan at this time Amlodipine  5 mg  Librium  taper - withdrawal protocol Potassium 3.0 on 04/29/24, repeat lab today was 4.3 WNL   4. Discharge Planning:   -- Social work and case management to assist with discharge planning and identification of hospital follow-up needs prior to discharge  -- Estimated LOS: 3-4 days  [name], NP

## 2024-05-02 NOTE — Assessment & Plan Note (Signed)
 On a Librium  taper

## 2024-05-02 NOTE — Progress Notes (Signed)
   05/02/24 0900  Psych Admission Type (Psych Patients Only)  Admission Status Involuntary  Psychosocial Assessment  Patient Complaints None  Eye Contact Fair  Facial Expression Flat  Affect Sad  Speech Soft  Interaction Assertive  Motor Activity Slow  Appearance/Hygiene Other (Comment) (Personal clothes)  Behavior Characteristics Cooperative  Mood Depressed  Aggressive Behavior  Effect No apparent injury  Thought Process  Coherency WDL  Content WDL  Delusions None reported or observed  Perception WDL  Hallucination None reported or observed  Judgment Impaired  Confusion WDL  Danger to Self  Current suicidal ideation? Denies  Self-Injurious Behavior No self-injurious ideation or behavior indicators observed or expressed   Agreement Not to Harm Self Yes  Description of Agreement verbal  Danger to Others  Danger to Others None reported or observed

## 2024-05-02 NOTE — BHH Counselor (Signed)
 CSW touched base with [facility]. Patient was accepted to [facility].   CSW to coordinate with patient and team to ensure safe discharge.  CSW to continue to assess.   Shantana Christon, MSW, LCSWA 05/02/2024 11:34 AM

## 2024-05-02 NOTE — Plan of Care (Signed)

## 2024-05-02 NOTE — Group Note (Signed)
 Date:  05/02/2024 Time:  4:05 PM  Group Topic/Focus:  Overcoming Stress:   The focus of this group is to define stress and help patients assess their triggers.    Participation Level:  Active  Participation Quality:  Appropriate  Affect:  Appropriate  Cognitive:  Appropriate  Insight: Appropriate  Engagement in Group:  Engaged  Modes of Intervention:  Activity  Additional Comments:    Christian Dickerson [name] 05/02/2024, 4:05 PM

## 2024-05-02 NOTE — Plan of Care (Signed)
  Problem: Education: Goal: Mental status will improve Outcome: Progressing Goal: Verbalization of understanding the information provided will improve Outcome: Progressing Note: Needs reinforcement education   Problem: Activity: Goal: Interest or engagement in activities will improve Outcome: Progressing Goal: Sleeping patterns will improve Outcome: Progressing   Problem: Health Behavior/Discharge Planning: Goal: Compliance with treatment plan for underlying cause of condition will improve Outcome: Progressing   Problem: Education: Goal: Emotional status will improve Outcome: Not Progressing   Problem: Coping: Goal: Ability to verbalize frustrations and anger appropriately will improve Outcome: Not Progressing Goal: Ability to demonstrate self-control will improve Outcome: Not Progressing

## 2024-05-02 NOTE — Hospital Course (Signed)
 38 year old man admitted to the psychiatry service for alcohol abuse and mood disorder.  Patient has a recent history of diverticular abscess that required drainage tube and antibiotics.  Was discharged from hospital on 04/03/2024 on oral antibiotics.  Patient states that he has been having some bleeding as outpatient going on for about a week.  Seeing some bright red blood but also some dark stool.  10/27.  Case discussed with Dr. Unk gastroenterology and will repeat a CT scan of the abdomen and pelvis to make sure abscess was fully treated.  She will see in consultation.  Hemoglobin stable.

## 2024-05-02 NOTE — Assessment & Plan Note (Signed)
 Continue psychiatric medications as per psychiatric team.

## 2024-05-02 NOTE — Consult Note (Signed)
 Corinn JONELLE Brooklyn, MD [address]  [place], [place] [address]  Main: [phone] Fax:  [phone] Pager: [phone]   Consultation  Referring Provider:     No ref. provider found Primary Care Physician:  Pcp, No Primary Gastroenterologist: Sampson         Reason for Consultation: Hematochezia  Date of Admission:  04/30/2024 Date of Consultation:  05/03/2024         HPI:   Christian Dickerson is a 38 y.o. male admitted to psychiatric service secondary to alcohol abuse and mood disorder.  He has recent history of diverticular abscess that required drainage with tube as well as antibiotics.  He was discharged from hospital on 9/28 on oral antibiotics.  Patient states that he has been having rectal bleeding as outpatient for about a week, bright red blood associated with some dark stool.  He also reports left lower quadrant pain.  Denies any fever, chills, nausea or vomiting.  GI is consulted for further evaluation of rectal bleeding Labs revealed hemoglobin of 13.8, no leukocytosis Patient underwent CT abdomen and pelvis which revealed persistent diverticular abscess in the sigmoid colon.  No evidence of bowel obstruction.  Patient states that he is homeless and not compliant with healthy diet.  He does not have healthy food options He denies any family history of GI malignancy  NSAIDs: None  Antiplts/Anticoagulants/Anti thrombotics: None  GI Procedures: None  Past Medical History:  Diagnosis Date   Alcohol abuse    Diverticulitis    Hypertension    Substance abuse (HCC)     Past Surgical History:  Procedure Laterality Date   ANKLE SURGERY     CHEST TUBE INSERTION     LITHOTRIPSY     TIBIA FRACTURE SURGERY     TONSILLECTOMY AND ADENOIDECTOMY      No current facility-administered medications for this encounter. No current outpatient medications on file.  Facility-Administered Medications Ordered in Other Encounters:    0.9 %  sodium chloride  infusion, ,  Intravenous, Continuous, Wieting, Richard, MD, Last Rate: 50 mL/hr at 05/03/24 1358, New Bag at 05/03/24 1358   acetaminophen  (TYLENOL ) tablet 650 mg, 650 mg, Oral, Q6H PRN **OR** acetaminophen  (TYLENOL ) suppository 650 mg, 650 mg, Rectal, Q6H PRN, Josette Ade, MD   NOREEN ON 05/04/2024] amLODipine  (NORVASC ) tablet 5 mg, 5 mg, Oral, Daily, Hunter, Crystal L, PA-C   ARIPiprazole (ABILIFY) tablet 5 mg, 5 mg, Oral, QHS, Wieting, Richard, MD   chlordiazePOXIDE  (LIBRIUM ) capsule 25 mg, 25 mg, Oral, TID, Josette, Richard, MD, 25 mg at 05/03/24 1357   [START ON 05/04/2024] FLUoxetine (PROZAC) capsule 10 mg, 10 mg, Oral, Daily, Hunter, Crystal L, PA-C   [START ON 05/04/2024] folic acid  (FOLVITE ) tablet 1 mg, 1 mg, Oral, Daily, Hunter, Crystal L, PA-C   gabapentin (NEURONTIN) capsule 300 mg, 300 mg, Oral, TID, Hunter, Crystal L, PA-C, 300 mg at 05/03/24 1357   meropenem  (MERREM ) 1 g in sodium chloride  0.9 % 100 mL IVPB, 1 g, Intravenous, Q8H, Steinbock, Emily, RPH, Last Rate: 200 mL/hr at 05/03/24 1131, 1 g at 05/03/24 1131   [START ON 05/04/2024] multivitamin with minerals tablet 1 tablet, 1 tablet, Oral, Daily, Hunter, Crystal L, PA-C   [START ON 05/04/2024] nicotine  (NICODERM CQ  - dosed in mg/24 hours) patch 14 mg, 14 mg, Transdermal, Daily, Hunter, Crystal L, PA-C   ondansetron  (ZOFRAN ) tablet 4 mg, 4 mg, Oral, Q6H PRN **OR** ondansetron  (ZOFRAN ) injection 4 mg, 4 mg, Intravenous, Q6H PRN, Josette Ade, MD  oxyCODONE  (Oxy IR/ROXICODONE ) immediate release tablet 5 mg, 5 mg, Oral, Q4H PRN, Josette, Richard, MD, 5 mg at 05/03/24 1356   [START ON 05/04/2024] pantoprazole  (PROTONIX ) EC tablet 40 mg, 40 mg, Oral, Daily, Hunter, Crystal L, PA-C   [START ON 05/04/2024] thiamine  (VITAMIN B1) tablet 100 mg, 100 mg, Oral, Daily, Hunter, Crystal L, PA-C   Family History  Problem Relation Age of Onset   Heart attack Mother    Heart attack Father      Social History   Tobacco Use   Smoking status:  Every Day    Current packs/day: 1.00    Average packs/day: 1 pack/day for 1.2 years (1.2 ttl pk-yrs)    Types: Cigarettes    Start date: 03/08/2023    Passive exposure: Current   Smokeless tobacco: Never  Substance Use Topics   Alcohol use: Yes    Alcohol/week: 35.0 standard drinks of alcohol    Types: 35 Shots of liquor per week    Comment: 2 fifths a day   Drug use: Yes    Types: Marijuana    Allergies as of 04/30/2024 - Review Complete 04/30/2024  Allergen Reaction Noted   Latex  04/06/2024    Review of Systems:    All systems reviewed and negative except where noted in HPI.   Physical Exam:  Vital signs in last 24 hours: Temp:  [97.9 F (36.6 C)-98.7 F (37.1 C)] 97.9 F (36.6 C) (10/28 0955) Pulse Rate:  [77-93] 93 (10/28 0955) Resp:  [16-18] 16 (10/28 0955) BP: (130-156)/(90-98) 156/93 (10/28 0955) SpO2:  [98 %-100 %] 98 % (10/28 0955) Last BM Date : 04/30/24 (per patient) General:   Alert,  Well-developed, well-nourished, pleasant and cooperative in NAD Eyes:  Sclera clear, no icterus.   Conjunctiva pink. Lungs:  Respirations even and unlabored.  Clear throughout to auscultation.   No wheezes, crackles, or rhonchi. No acute distress. Heart:  Regular rate and rhythm; no murmurs, clicks, rubs, or gallops. Abdomen:  Normal bowel sounds. Soft, mild left lower quadrant tenderness and non-distended without masses, hepatosplenomegaly or hernias noted.  No guarding or rebound tenderness.   Rectal: Not performed Extremities:  No clubbing or edema.  No cyanosis. Neurologic:  Alert and oriented x3 Skin:  Intact without significant lesions or rashes. No jaundice. Psych:  Alert and cooperative. Normal mood and affect.  LAB RESULTS:    Latest Ref Rng & Units 05/03/2024    6:27 AM 05/01/2024    8:48 PM 04/29/2024    9:35 PM  CBC  WBC 4.0 - 10.5 K/uL 6.1  7.8  8.6   Hemoglobin 13.0 - 17.0 g/dL 85.6  86.1  86.7   Hematocrit 39.0 - 52.0 % 41.8  40.2  38.9   Platelets 150 -  400 K/uL 163  180  211     BMET    Latest Ref Rng & Units 05/01/2024   10:22 AM 04/29/2024    9:35 PM 04/11/2024    8:41 AM  BMP  Glucose 70 - 99 mg/dL 897  92  99   BUN 6 - 20 mg/dL 9  10  12    Creatinine 0.61 - 1.24 mg/dL 9.41  9.46  9.20   Sodium 135 - 145 mmol/L 139  137  139   Potassium 3.5 - 5.1 mmol/L 4.3  3.0  4.3   Chloride 98 - 111 mmol/L 103  100  104   CO2 22 - 32 mmol/L 24  20  23    Calcium  8.9 - 10.3 mg/dL 9.3  8.6  9.4     LFT    Latest Ref Rng & Units 05/01/2024   10:22 AM 04/29/2024    9:35 PM 04/08/2024   10:15 PM  Hepatic Function  Total Protein 6.5 - 8.1 g/dL 7.8  7.6  8.7   Albumin 3.5 - 5.0 g/dL 3.6  3.5  4.4   AST 15 - 41 U/L 39  31  53   ALT 0 - 44 U/L 35  32  45   Alk Phosphatase 38 - 126 U/L 103  103  111   Total Bilirubin 0.0 - 1.2 mg/dL 0.9  0.7  0.7      STUDIES: CT ABDOMEN PELVIS W CONTRAST Result Date: 05/02/2024 EXAM: CT ABDOMEN AND PELVIS WITH CONTRAST 05/02/2024 02:56:51 PM TECHNIQUE: CT of the abdomen and pelvis was performed with the administration of 100 mL of iohexol  (OMNIPAQUE ) 300 MG/ML solution. Multiplanar reformatted images are provided for review. Automated exposure control, iterative reconstruction, and/or weight-based adjustment of the mA/kV was utilized to reduce the radiation dose to as low as reasonably achievable. COMPARISON: CT abdomen and pelvis. CLINICAL HISTORY: LLQ abdominal pain. FINDINGS: LOWER CHEST: No acute abnormality. LIVER: A subcentimeter hypodensity in the right lobe of the liver which is too small to characterize, likely a cyst or hemangioma. GALLBLADDER AND BILE DUCTS: Gallbladder is unremarkable. No biliary ductal dilatation. SPLEEN: No acute abnormality. PANCREAS: No acute abnormality. ADRENAL GLANDS: No acute abnormality. KIDNEYS, URETERS AND BLADDER: No stones in the kidneys or ureters. No hydronephrosis. No perinephric or periureteral stranding. Urinary bladder is unremarkable. GI AND BOWEL: Stomach  demonstrates no acute abnormality. Appendix appears normal. There is sigmoid colon diverticulosis. There is wall thickening of the sigmoid colon with trace surrounding inflammation. Adjacent and separate air-fluid collection is seen posterior to the sigmoid colon measuring 1.9 x 1.9 x 2.0 cm. This is similar in location and size when compared to prior. There is no bowel obstruction. PERITONEUM AND RETROPERITONEUM: No ascites. No free air. No other gross free intraperitoneal air. VASCULATURE: Aorta is normal in caliber. LYMPH NODES: No lymphadenopathy. REPRODUCTIVE ORGANS: No acute abnormality. BONES AND SOFT TISSUES: No acute osseous abnormality. There is a small fat-containing right inguinal hernia. No focal soft tissue abnormality. IMPRESSION: 1. Sigmoid colon diverticulitis with adjacent 1.9 x 1.9 x 2.0 cm air-fluid collection concerning for abscess/ contained perforation. Fluid collection similar in size and location compared to prior study. 2. Sigmoid diverticulosis. Electronically signed by: Greig Pique MD 05/02/2024 09:30 PM EDT RP Workstation: [id number]   CT ANKLE RIGHT W CONTRAST Result Date: 05/02/2024 CLINICAL DATA:  Right ankle pain and swelling after falling on Wednesday. EXAM: CT OF THE RIGHT ANKLE WITH CONTRAST TECHNIQUE: Multidetector CT imaging of the right ankle was performed following the standard protocol during bolus administration of intravenous contrast. RADIATION DOSE REDUCTION: This exam was performed according to the departmental dose-optimization program which includes automated exposure control, adjustment of the mA and/or kV according to patient size and/or use of iterative reconstruction technique. CONTRAST:  75mL OMNIPAQUE  IOHEXOL  300 MG/ML  SOLN COMPARISON:  Radiographs 04/29/2024 and 12/09/2016. FINDINGS: Bones/Joint/Cartilage Stable postsurgical changes from anterior tibiotalar plate and screw fixation. The hardware appears unchanged. There is a chronic fracture of one of the  upper tibial screws. No evidence of hardware loosening or displacement. 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 There is also mild chronic deformity of the distal fibula. No evidence of acute fracture, dislocation or acute  bone destruction. Mild subtalar and talonavicular degenerative changes. Ligaments Suboptimally assessed by CT. Muscles and Tendons As evaluated by CT, the ankle tendons appear intact. Prominent spurring projecting posteriorly from the medial malleolus partially encases the posterior tibialis tendon. Soft tissues Moderate generalized subcutaneous edema surrounding the ankle with extension into dorsal aspect of the midfoot. No organized fluid collection, unexpected foreign body or soft tissue emphysema. IMPRESSION: 1. No acute osseous findings or evidence of acute bone destruction. 2. Stable postsurgical changes from anterior tibiotalar plate and screw fixation with chronic fracture of one of the upper tibial screws. 3. 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 4. Nonspecific, moderate generalized subcutaneous edema surrounding the ankle with extension into the dorsal aspect of the midfoot. No organized fluid collection, unexpected foreign body or soft tissue emphysema. Electronically Signed   By: Elsie Perone M.D.   On: 05/02/2024 11:39      Impression / Plan:   Christian Dickerson is a 37 y.o. male  admitted to psychiatry service secondary to alcohol abuse and mood disorder.  He is admitted to [facility] in late September secondary to sigmoid colon diverticulitis with abscess s/p percutaneous drainage with catheter placement s/p removal is complaining of hematochezia  Hematochezia, hemodynamically insignificant GI bleed Hemoglobin is within normal limits repeat CT abdomen and pelvis reveals persistent diverticular abscess Therefore, do not recommend colonoscopy at this time until resolution of  complicated sigmoid diverticulitis Recommend surgical evaluation for possible sigmoid colon resection  Recommend colonoscopy as outpatient after resolution of complicated sigmoid diverticulitis Diet as tolerated  Thank you for involving me in the care of this patient.  GI will sign off at this time, please call us  back with questions or concerns    LOS: 3 days   Corinn Brooklyn, MD  05/03/2024, 2:40 PM    Note: This dictation was prepared with Dragon dictation along with smaller phrase technology. Any transcriptional errors that result from this process are unintentional.

## 2024-05-02 NOTE — Group Note (Signed)
 Date:  05/02/2024 Time:  8:35 PM  Group Topic/Focus:  Wrap-Up Group:   The focus of this group is to help patients review their daily goal of treatment and discuss progress on daily workbooks.    Participation Level:  Active  Participation Quality:  Appropriate and Attentive  Affect:  Appropriate  Cognitive:  Alert and Appropriate  Insight: Appropriate and Good  Engagement in Group:  Engaged  Modes of Intervention:  Activity  Additional Comments:     Arlester CHRISTELLA Servant 05/02/2024, 8:35 PM

## 2024-05-02 NOTE — Assessment & Plan Note (Signed)
 CT scan to make sure this has been fully treated.  Discharged from the hospital on 04/03/24 on oral antibiotics.

## 2024-05-02 NOTE — Progress Notes (Addendum)
 The patient was observed by [facility] staff attempting to bite down on the intravenous (IV) line placed for the CT scan. The patient verbally expressed intent to cause blood to flow from the IV if not promptly taken for the CT scan or provided with a foot boot. In response, the nurse and another staff member engaged in supportive listening and verbal redirection to address the patient's concerns.  The patient communicated that they were experiencing significant pain and felt that the response from staff was not swift enough, which contributed to the behavior. The patient acknowledged that they were aware such actions would prompt the [facility] staff to notify the treatment team, thus expediting their care. After receiving redirection, the patient agreed to cease the behavior and apologized to staff.  The provider has been notified

## 2024-05-02 NOTE — Assessment & Plan Note (Signed)
 On Norvasc 

## 2024-05-02 NOTE — Assessment & Plan Note (Signed)
BMI 31.86

## 2024-05-03 ENCOUNTER — Encounter (HOSPITAL_COMMUNITY): Payer: Self-pay

## 2024-05-03 ENCOUNTER — Encounter: Payer: Self-pay | Admitting: Internal Medicine

## 2024-05-03 ENCOUNTER — Inpatient Hospital Stay
Admission: AD | Admit: 2024-05-03 | Discharge: 2024-05-16 | DRG: 330 | Disposition: A | Discharge status: Home / Self Care | Source: Ambulatory Visit | Attending: Student | Admitting: Student

## 2024-05-03 ENCOUNTER — Other Ambulatory Visit: Payer: Self-pay

## 2024-05-03 DIAGNOSIS — Z5982 Transportation insecurity: Secondary | ICD-10-CM | POA: Diagnosis not present

## 2024-05-03 DIAGNOSIS — F17210 Nicotine dependence, cigarettes, uncomplicated: Secondary | ICD-10-CM | POA: Diagnosis present

## 2024-05-03 DIAGNOSIS — Z560 Unemployment, unspecified: Secondary | ICD-10-CM

## 2024-05-03 DIAGNOSIS — F3132 Bipolar disorder, current episode depressed, moderate: Secondary | ICD-10-CM | POA: Diagnosis present

## 2024-05-03 DIAGNOSIS — R45851 Suicidal ideations: Secondary | ICD-10-CM | POA: Diagnosis present

## 2024-05-03 DIAGNOSIS — Z6834 Body mass index (BMI) 34.0-34.9, adult: Secondary | ICD-10-CM

## 2024-05-03 DIAGNOSIS — Z79899 Other long term (current) drug therapy: Secondary | ICD-10-CM | POA: Diagnosis not present

## 2024-05-03 DIAGNOSIS — I10 Essential (primary) hypertension: Secondary | ICD-10-CM | POA: Diagnosis present

## 2024-05-03 DIAGNOSIS — N433 Hydrocele, unspecified: Secondary | ICD-10-CM | POA: Diagnosis present

## 2024-05-03 DIAGNOSIS — I861 Scrotal varices: Secondary | ICD-10-CM | POA: Diagnosis present

## 2024-05-03 DIAGNOSIS — K630 Abscess of intestine: Secondary | ICD-10-CM | POA: Diagnosis not present

## 2024-05-03 DIAGNOSIS — Z8249 Family history of ischemic heart disease and other diseases of the circulatory system: Secondary | ICD-10-CM

## 2024-05-03 DIAGNOSIS — W19XXXA Unspecified fall, initial encounter: Secondary | ICD-10-CM | POA: Diagnosis not present

## 2024-05-03 DIAGNOSIS — M25571 Pain in right ankle and joints of right foot: Secondary | ICD-10-CM | POA: Diagnosis not present

## 2024-05-03 DIAGNOSIS — Z5900 Homelessness unspecified: Secondary | ICD-10-CM | POA: Diagnosis not present

## 2024-05-03 DIAGNOSIS — N452 Orchitis: Secondary | ICD-10-CM | POA: Diagnosis present

## 2024-05-03 DIAGNOSIS — Z5948 Other specified lack of adequate food: Secondary | ICD-10-CM | POA: Diagnosis not present

## 2024-05-03 DIAGNOSIS — K5720 Diverticulitis of large intestine with perforation and abscess without bleeding: Secondary | ICD-10-CM | POA: Diagnosis present

## 2024-05-03 DIAGNOSIS — F419 Anxiety disorder, unspecified: Secondary | ICD-10-CM | POA: Diagnosis present

## 2024-05-03 DIAGNOSIS — Z5941 Food insecurity: Secondary | ICD-10-CM

## 2024-05-03 DIAGNOSIS — E669 Obesity, unspecified: Secondary | ICD-10-CM | POA: Diagnosis present

## 2024-05-03 DIAGNOSIS — Z720 Tobacco use: Secondary | ICD-10-CM | POA: Insufficient documentation

## 2024-05-03 DIAGNOSIS — F1020 Alcohol dependence, uncomplicated: Secondary | ICD-10-CM | POA: Diagnosis present

## 2024-05-03 DIAGNOSIS — K5721 Diverticulitis of large intestine with perforation and abscess with bleeding: Principal | ICD-10-CM | POA: Diagnosis present

## 2024-05-03 DIAGNOSIS — Z716 Tobacco abuse counseling: Secondary | ICD-10-CM | POA: Diagnosis not present

## 2024-05-03 DIAGNOSIS — K660 Peritoneal adhesions (postprocedural) (postinfection): Secondary | ICD-10-CM | POA: Diagnosis present

## 2024-05-03 DIAGNOSIS — K625 Hemorrhage of anus and rectum: Secondary | ICD-10-CM | POA: Diagnosis not present

## 2024-05-03 DIAGNOSIS — K651 Peritoneal abscess: Secondary | ICD-10-CM | POA: Diagnosis present

## 2024-05-03 DIAGNOSIS — K5732 Diverticulitis of large intestine without perforation or abscess without bleeding: Secondary | ICD-10-CM | POA: Diagnosis not present

## 2024-05-03 HISTORY — DX: Essential (primary) hypertension: I10

## 2024-05-03 LAB — CBC
HCT: 41.8 % (ref 39.0–52.0)
Hemoglobin: 14.3 g/dL (ref 13.0–17.0)
MCH: 35 pg — ABNORMAL HIGH (ref 26.0–34.0)
MCHC: 34.2 g/dL (ref 30.0–36.0)
MCV: 102.5 fL — ABNORMAL HIGH (ref 80.0–100.0)
Platelets: 163 K/uL (ref 150–400)
RBC: 4.08 MIL/uL — ABNORMAL LOW (ref 4.22–5.81)
RDW: 14 % (ref 11.5–15.5)
WBC: 6.1 K/uL (ref 4.0–10.5)
nRBC: 0 % (ref 0.0–0.2)

## 2024-05-03 MED ORDER — PANTOPRAZOLE SODIUM 40 MG PO TBEC: 40.0000 mg | DELAYED_RELEASE_TABLET | Freq: Every day | ORAL | Status: DC

## 2024-05-03 MED ORDER — THIAMINE MONONITRATE 100 MG PO TABS
100.0000 mg | ORAL_TABLET | Freq: Every day | ORAL | Status: DC
  Administered 2024-05-04 – 2024-05-16 (×12): 100 mg via ORAL
  Filled 2024-05-03 (×12): qty 1

## 2024-05-03 MED ORDER — METRONIDAZOLE 500 MG PO TABS
500.0000 mg | ORAL_TABLET | Freq: Two times a day (BID) | ORAL | Status: DC
  Administered 2024-05-03: 500 mg via ORAL
  Filled 2024-05-03: qty 1

## 2024-05-03 MED ORDER — TRAZODONE HCL 50 MG PO TABS
50.0000 mg | ORAL_TABLET | Freq: Every evening | ORAL | Status: DC | PRN
  Administered 2024-05-03: 50 mg via ORAL
  Filled 2024-05-03: qty 1

## 2024-05-03 MED ORDER — HYDROXYZINE HCL 25 MG PO TABS: 25.0000 mg | ORAL_TABLET | Freq: Four times a day (QID) | ORAL | Status: AC | PRN

## 2024-05-03 MED ORDER — ADULT MULTIVITAMIN W/MINERALS CH
1.0000 | ORAL_TABLET | Freq: Every day | ORAL | Status: DC
Start: 2024-05-04 — End: 2024-05-03

## 2024-05-03 MED ORDER — FLUOXETINE HCL 10 MG PO CAPS: 10.0000 mg | ORAL_CAPSULE | Freq: Every day | ORAL | 0 refills | Status: DC

## 2024-05-03 MED ORDER — OXYCODONE HCL 5 MG PO TABS
5.0000 mg | ORAL_TABLET | ORAL | Status: DC | PRN
  Administered 2024-05-03 – 2024-05-04 (×5): 5 mg via ORAL
  Filled 2024-05-03 (×5): qty 1

## 2024-05-03 MED ORDER — METRONIDAZOLE 500 MG PO TABS: 500.0000 mg | ORAL_TABLET | Freq: Two times a day (BID) | ORAL | Status: DC

## 2024-05-03 MED ORDER — CHLORDIAZEPOXIDE HCL 25 MG PO CAPS: 25.0000 mg | ORAL_CAPSULE | Freq: Every day | ORAL | Status: DC

## 2024-05-03 MED ORDER — AMLODIPINE BESYLATE 5 MG PO TABS
5.0000 mg | ORAL_TABLET | Freq: Every day | ORAL | Status: DC
  Administered 2024-05-04 – 2024-05-16 (×12): 5 mg via ORAL
  Filled 2024-05-03 (×12): qty 1

## 2024-05-03 MED ORDER — GABAPENTIN 300 MG PO CAPS
300.0000 mg | ORAL_CAPSULE | Freq: Three times a day (TID) | ORAL | Status: DC
  Administered 2024-05-03 – 2024-05-16 (×38): 300 mg via ORAL
  Filled 2024-05-03 (×39): qty 1

## 2024-05-03 MED ORDER — GABAPENTIN 300 MG PO CAPS
300.0000 mg | ORAL_CAPSULE | Freq: Three times a day (TID) | ORAL | 0 refills | Status: DC
Start: 2024-05-03 — End: 2024-05-16

## 2024-05-03 MED ORDER — PANTOPRAZOLE SODIUM 40 MG PO TBEC: 40.0000 mg | DELAYED_RELEASE_TABLET | Freq: Every day | ORAL | 0 refills | Status: DC

## 2024-05-03 MED ORDER — SODIUM CHLORIDE 0.9 % IV SOLN
1.0000 g | Freq: Three times a day (TID) | INTRAVENOUS | Status: AC
  Administered 2024-05-03 – 2024-05-08 (×15): 1 g via INTRAVENOUS
  Filled 2024-05-03 (×16): qty 20

## 2024-05-03 MED ORDER — THIAMINE MONONITRATE 100 MG PO TABS: 100.0000 mg | ORAL_TABLET | Freq: Every day | ORAL | Status: DC

## 2024-05-03 MED ORDER — ONDANSETRON 4 MG PO TBDP: 4.0000 mg | ORAL_TABLET | Freq: Four times a day (QID) | ORAL | Status: AC | PRN

## 2024-05-03 MED ORDER — CIPROFLOXACIN HCL 500 MG PO TABS: 500.0000 mg | ORAL_TABLET | Freq: Two times a day (BID) | ORAL | 0 refills | Status: DC

## 2024-05-03 MED ORDER — CHLORDIAZEPOXIDE HCL 25 MG PO CAPS
25.0000 mg | ORAL_CAPSULE | Freq: Every day | ORAL | 0 refills | Status: DC
Start: 2024-05-04 — End: 2024-05-16

## 2024-05-03 MED ORDER — ONDANSETRON HCL 4 MG/2ML IJ SOLN: 4.0000 mg | Freq: Four times a day (QID) | INTRAMUSCULAR | Status: DC | PRN

## 2024-05-03 MED ORDER — NICOTINE 14 MG/24HR TD PT24
14.0000 mg | MEDICATED_PATCH | Freq: Every day | TRANSDERMAL | Status: DC
  Administered 2024-05-04 – 2024-05-16 (×12): 14 mg via TRANSDERMAL
  Filled 2024-05-03 (×12): qty 1

## 2024-05-03 MED ORDER — FOLIC ACID 1 MG PO TABS
1.0000 mg | ORAL_TABLET | Freq: Every day | ORAL | Status: DC
  Administered 2024-05-04 – 2024-05-16 (×12): 1 mg via ORAL
  Filled 2024-05-03 (×12): qty 1

## 2024-05-03 MED ORDER — LOPERAMIDE HCL 2 MG PO CAPS: 2.0000 mg | ORAL_CAPSULE | ORAL | Status: AC | PRN

## 2024-05-03 MED ORDER — ACETAMINOPHEN 650 MG RE SUPP: 650.0000 mg | Freq: Four times a day (QID) | RECTAL | Status: DC | PRN

## 2024-05-03 MED ORDER — ARIPIPRAZOLE 10 MG PO TABS: 5.0000 mg | ORAL_TABLET | Freq: Every day | ORAL | Status: DC

## 2024-05-03 MED ORDER — ONDANSETRON HCL 4 MG PO TABS: 4.0000 mg | ORAL_TABLET | Freq: Four times a day (QID) | ORAL | Status: DC | PRN

## 2024-05-03 MED ORDER — FOLIC ACID 1 MG PO TABS: 1.0000 mg | ORAL_TABLET | Freq: Every day | ORAL | Status: DC

## 2024-05-03 MED ORDER — CHLORDIAZEPOXIDE HCL 25 MG PO CAPS
25.0000 mg | ORAL_CAPSULE | Freq: Three times a day (TID) | ORAL | Status: DC
  Administered 2024-05-03 – 2024-05-16 (×38): 25 mg via ORAL
  Filled 2024-05-03 (×39): qty 1

## 2024-05-03 MED ORDER — VITAMIN B-1 100 MG PO TABS: 100.0000 mg | ORAL_TABLET | Freq: Every day | ORAL | 0 refills | Status: DC

## 2024-05-03 MED ORDER — METRONIDAZOLE 500 MG PO TABS: 500.0000 mg | ORAL_TABLET | Freq: Two times a day (BID) | ORAL | 0 refills | Status: DC

## 2024-05-03 MED ORDER — ADULT MULTIVITAMIN W/MINERALS CH
1.0000 | ORAL_TABLET | Freq: Every day | ORAL | Status: DC
  Administered 2024-05-04 – 2024-05-16 (×12): 1 via ORAL
  Filled 2024-05-03 (×12): qty 1

## 2024-05-03 MED ORDER — ARIPIPRAZOLE 5 MG PO TABS
5.0000 mg | ORAL_TABLET | Freq: Every day | ORAL | 0 refills | Status: DC
Start: 2024-05-03 — End: 2024-05-16

## 2024-05-03 MED ORDER — CIPROFLOXACIN HCL 500 MG PO TABS: 500.0000 mg | ORAL_TABLET | Freq: Two times a day (BID) | ORAL | Status: DC

## 2024-05-03 MED ORDER — ACETAMINOPHEN 325 MG PO TABS
650.0000 mg | ORAL_TABLET | Freq: Four times a day (QID) | ORAL | Status: DC | PRN
  Administered 2024-05-07 – 2024-05-10 (×2): 650 mg via ORAL
  Filled 2024-05-03 (×2): qty 2

## 2024-05-03 MED ORDER — CIPROFLOXACIN HCL 500 MG PO TABS
500.0000 mg | ORAL_TABLET | Freq: Two times a day (BID) | ORAL | Status: DC
  Administered 2024-05-03: 500 mg via ORAL
  Filled 2024-05-03: qty 1

## 2024-05-03 MED ORDER — GABAPENTIN 300 MG PO CAPS: 300.0000 mg | ORAL_CAPSULE | Freq: Three times a day (TID) | ORAL | Status: DC

## 2024-05-03 MED ORDER — SODIUM CHLORIDE 0.9 % IV SOLN: INTRAVENOUS | Status: DC

## 2024-05-03 MED ORDER — FLUOXETINE HCL 10 MG PO CAPS: 10.0000 mg | ORAL_CAPSULE | Freq: Every day | ORAL | Status: DC

## 2024-05-03 MED ORDER — ARIPIPRAZOLE 10 MG PO TABS
5.0000 mg | ORAL_TABLET | Freq: Every day | ORAL | Status: DC
  Administered 2024-05-03 – 2024-05-15 (×13): 5 mg via ORAL
  Filled 2024-05-03 (×14): qty 1

## 2024-05-03 MED ORDER — CHLORDIAZEPOXIDE HCL 25 MG PO CAPS
25.0000 mg | ORAL_CAPSULE | Freq: Every day | ORAL | Status: AC
  Administered 2024-05-03: 25 mg via ORAL
  Filled 2024-05-03: qty 1

## 2024-05-03 MED ORDER — PANTOPRAZOLE SODIUM 40 MG PO TBEC
40.0000 mg | DELAYED_RELEASE_TABLET | Freq: Every day | ORAL | Status: DC
Start: 2024-05-04 — End: 2024-05-16
  Administered 2024-05-04 – 2024-05-16 (×12): 40 mg via ORAL
  Filled 2024-05-03 (×12): qty 1

## 2024-05-03 MED ORDER — FLUOXETINE HCL 10 MG PO CAPS
10.0000 mg | ORAL_CAPSULE | Freq: Every day | ORAL | Status: DC
  Administered 2024-05-04 – 2024-05-05 (×2): 10 mg via ORAL
  Filled 2024-05-03 (×2): qty 1

## 2024-05-03 MED ORDER — AMLODIPINE BESYLATE 5 MG PO TABS: 5.0000 mg | ORAL_TABLET | Freq: Every day | ORAL | Status: DC

## 2024-05-03 NOTE — Assessment & Plan Note (Signed)
 Nicotine  patch.

## 2024-05-03 NOTE — Discharge Summary (Signed)
 Physician Discharge Summary Note  Patient:  Christian Dickerson is an 38 y.o., male MRN:  [id number] DOB:  1985/07/29 Patient phone:  [phone] (home)  Patient address:   [address] [place] [address],   Total time spent: 40 min Date of Admission:  04/30/2024 Date of Discharge: 05/03/24 Patient is a 38 year old male with a past psychiatric history of alcohol use disorder who presented to the [facility] ED complaining of suicide attempt by taking Twenty Nine 5 mg BP pills with two 40 oz beers. Throughout evaluation in ED, patient's report was noted to be inconsistent with stating he overdosed then denying he overdosed. Patient is admitted to adult psych unit with Q15 min safety monitoring. Multidisciplinary team approach is offered. Medication management; group/milieu therapy is offered.  Reason for Admission:    Principal Problem: Rectal bleeding Discharge Diagnoses: Principal Problem:   Rectal bleeding Active Problems:   Diverticulitis of colon with perforation   Alcohol dependence with alcohol-induced mood disorder (HCC)   Major depressive disorder, recurrent severe without psychotic features (HCC)   Mood disorder   Blood in stool   Diverticulosis   Essential hypertension   Obesity (BMI 30-39.9)   Acute right ankle pain   Past Psychiatric History: see h&p  Family Psychiatric  History: see h&p Social History:  Social History   Substance and Sexual Activity  Alcohol Use Yes   Alcohol/week: 35.0 standard drinks of alcohol   Types: 35 Shots of liquor per week   Comment: 2 fifths a day     Social History   Substance and Sexual Activity  Drug Use Yes   Types: Marijuana    Social History   Socioeconomic History   Marital status: Single    Spouse name: Not on file   Number of children: Not on file   Years of education: Not on file   Highest education level: Not on file  Occupational History   Not on file  Tobacco Use   Smoking status: Every Day    Current  packs/day: 1.00    Average packs/day: 1 pack/day for 1.2 years (1.2 ttl pk-yrs)    Types: Cigarettes    Start date: 03/08/2023    Passive exposure: Current   Smokeless tobacco: Never  Substance and Sexual Activity   Alcohol use: Yes    Alcohol/week: 35.0 standard drinks of alcohol    Types: 35 Shots of liquor per week    Comment: 2 fifths a day   Drug use: Yes    Types: Marijuana   Sexual activity: Not Currently  Other Topics Concern   Not on file  Social History Narrative   Not on file   Social Drivers of Health   Financial Resource Strain: Not on file  Food Insecurity: Food Insecurity Present (04/30/2024)   Hunger Vital Sign    Worried About Running Out of Food in the Last Year: Often true    Ran Out of Food in the Last Year: Often true  Transportation Needs: Patient Declined (04/30/2024)   PRAPARE - Administrator, Civil Service (Medical): Patient declined    Lack of Transportation (Non-Medical): Patient declined  Recent Concern: Transportation Needs - Unmet Transportation Needs (04/09/2024)   PRAPARE - Administrator, Civil Service (Medical): Yes    Lack of Transportation (Non-Medical): Yes  Physical Activity: Not on file  Stress: Not on file  Social Connections: Socially Isolated (03/31/2024)   Social Connection and Isolation Panel  Frequency of Communication with Friends and Family: Once a week    Frequency of Social Gatherings with Friends and Family: Never    Attends Religious Services: Never    Diplomatic Services Operational Officer: No    Attends Engineer, Structural: Never    Marital Status: Never married   Past Medical History:  Past Medical History:  Diagnosis Date   Alcohol abuse    Diverticulitis    Substance abuse (HCC)     Past Surgical History:  Procedure Laterality Date   ANKLE SURGERY     CHEST TUBE INSERTION     LITHOTRIPSY     TIBIA FRACTURE SURGERY     TONSILLECTOMY AND ADENOIDECTOMY     Family History:  History reviewed. No pertinent family history.  Hospital Course:  Patient is a 38 year old male with a past psychiatric history of alcohol use disorder who presented to the [facility] ED complaining of suicide attempt by taking Twenty Nine 5 mg BP pills with two 40 oz beers. Throughout evaluation in ED, patient's report was noted to be inconsistent with stating he overdosed then denying he overdosed. Patient is admitted to adult psych unit with Q15 min safety monitoring. Multidisciplinary team approach is offered. Medication management; group/milieu therapy is offered.  Detailed risk assessment is complete based on clinical exam and individual risk factors and acute suicide risk is low and acute violence risk is low.    On admission, patient had multiple medical complaints and displayed significant personality problems, creating conflicts and splitting the staff.  He is also noted to be making threats of hurting people if he does not get what he wants.  Patient was started on the following medications and hospitalist consult was called for further workup.  Podiatry consult was also called for swelling of the ankle as he had prior history of ankle fracture.  The following are the recommendations:Abilify 5 mg at bedtime      Fluoxetine 10 mg Folic acid  1 mg Gabapentin 300 mg TID Multivitamin daily Nicotine  patch 14mg  Thiamine  tablet 100 mg                      -- The risks/benefits/side-effects/alternatives to this medication were discussed in detail with the patient and time was given for questions. The patient consents to medication trial.  3. Medical Issues Being Addressed:  Leg pain/swelling - Xrays in ED insignificant; continue PRN medication for pain Naproxen  250 mg BID for leg pain (podiatry recommends starting with NSAIDS) Appreciate Ortho and Podiatry consult. Podiatry (Dr.Semen) reviewed case and suggested CT scan along with walking boot (if allowed on psych unit)- patient awaiting to get CT scan  at this time Amlodipine  5 mg  Librium  taper - withdrawal protocol Potassium 3.0 on 04/29/24, repeat lab today was 4.3 WNL  On 05/02/2024 patient complained of blood in the stools.  Hospitalist was consulted who consulted gastroenterologist.  CT abdomen was done and due to suspicion of diverticulitis patient was recommended to be admitted to the medical floor for further investigation.  Patient is discharged from the psychiatric unit on 05/03/2024 and sent to medical floor for further medical workup and stabilization. Physical Findings: AIMS:  , ,  ,  ,    CIWA:  CIWA-Ar Total: 0 COWS:        Psychiatric Specialty Exam:  Presentation  General Appearance:  Appropriate for Environment  Eye Contact: Good  Speech: Clear and Coherent  Speech Volume: Normal  Mood and Affect  Mood: Euthymic  Affect: Appropriate   Thought Process  Thought Processes: Coherent  Descriptions of Associations:Intact  Orientation:Full (Time, Place and Person)  Thought Content:WDL  Hallucinations:Hallucinations: None  Ideas of Reference:None  Suicidal Thoughts:Suicidal Thoughts: No  Homicidal Thoughts:Homicidal Thoughts: No   Sensorium  Memory: Immediate Fair; Recent Fair; Remote Fair  Judgment: Poor  Insight: Fair   Chartered Certified Accountant: Fair  Attention Span: Fair  Recall: [employer] of Knowledge: Fair  Language: Fair   Psychomotor Activity  Psychomotor Activity: Psychomotor Activity: Normal  Musculoskeletal: Strength & Muscle Tone: within normal limits Gait & Station: normal Assets  Assets: Manufacturing Systems Engineer; Desire for Improvement   Sleep  Sleep: Sleep: Fair    Physical Exam: Physical Exam ROS Blood pressure (!) 141/98, pulse 80, temperature 98.7 F (37.1 C), resp. rate 18, height 6' 1 (1.854 m), weight 109.5 kg, SpO2 100%. Body mass index is 31.86 kg/m.   Social History   Tobacco Use  Smoking Status Every Day    Current packs/day: 1.00   Average packs/day: 1 pack/day for 1.2 years (1.2 ttl pk-yrs)   Types: Cigarettes   Start date: 03/08/2023   Passive exposure: Current  Smokeless Tobacco Never   Tobacco Cessation:  N/A, patient does not currently use tobacco products   Blood Alcohol level:  Lab Results  Component Value Date   ETH 65 (H) 04/29/2024   ETH 354 (HH) 04/08/2024    Metabolic Disorder Labs:  Lab Results  Component Value Date   HGBA1C 5.2 04/29/2024   MPG 102.54 04/29/2024   No results found for: PROLACTIN Lab Results  Component Value Date   CHOL 158 04/29/2024   TRIG 192 (H) 04/29/2024   HDL 42 04/29/2024   CHOLHDL 3.8 04/29/2024   VLDL 38 04/29/2024   LDLCALC 78 04/29/2024    See Psychiatric Specialty Exam and Suicide Risk Assessment completed by Attending Physician prior to discharge.  Discharge destination:  Other:  medical floor  Is patient on multiple antipsychotic therapies at discharge:  No   Has Patient had three or more failed trials of antipsychotic monotherapy by history:  No  Recommended Plan for Multiple Antipsychotic Therapies: NA   Allergies as of 05/03/2024       Reactions   Latex         Medication List     TAKE these medications      Indication  amLODipine  5 MG tablet Commonly known as: NORVASC  Take 1 tablet (5 mg total) by mouth daily.  Indication: High Blood Pressure   ARIPiprazole 5 MG tablet Commonly known as: ABILIFY Take 1 tablet (5 mg total) by mouth at bedtime.  Indication: Major Depressive Disorder   CertaVite/Antioxidants Tabs Take 1 tablet by mouth daily.  Indication: vitamin deficiency   chlordiazePOXIDE  25 MG capsule Commonly known as: LIBRIUM  Take 1 capsule (25 mg total) by mouth daily. Start taking on: May 04, 2024  Indication: Feeling Anxious   ciprofloxacin  500 MG tablet Commonly known as: CIPRO  Take 1 tablet (500 mg total) by mouth 2 (two) times daily.  Indication: Bacterial Digestive Tract  Infection   FLUoxetine 10 MG capsule Commonly known as: PROZAC Take 1 capsule (10 mg total) by mouth daily. Start taking on: May 04, 2024  Indication: Depression   folic acid  1 MG tablet Commonly known as: FOLVITE  Take 1 tablet (1 mg total) by mouth daily.  Indication: folate deficiency   gabapentin 300 MG capsule Commonly known as: NEURONTIN Take 1  capsule (300 mg total) by mouth 3 (three) times daily.  Indication: Abuse or Misuse of Alcohol   metroNIDAZOLE  500 MG tablet Commonly known as: FLAGYL  Take 1 tablet (500 mg total) by mouth every 12 (twelve) hours.  Indication: Infection caused by Helicobacter Pylori Bacteria   pantoprazole  40 MG tablet Commonly known as: PROTONIX  Take 1 tablet (40 mg total) by mouth daily. Start taking on: May 04, 2024  Indication: Stomach Ulcer   thiamine  100 MG tablet Commonly known as: Vitamin B-1 Take 1 tablet (100 mg total) by mouth daily. Start taking on: May 04, 2024  Indication: Deficiency of Vitamin B1         Follow-up recommendations:  Activity:  as tolerated    Signed: Rosealee Recinos, MD 05/03/2024, 9:37 AM

## 2024-05-03 NOTE — Assessment & Plan Note (Signed)
 Continue Norvasc 

## 2024-05-03 NOTE — Progress Notes (Addendum)
 CT scan showing persistent fluid collection.  Will give po cipro  and flagyl  while on psych floor.  Case discussed with general surgery, will transfer to medical floor for iv antibiotics and surgical eval.  Spoke with bed placement to obtain medical bed.  Messaged psychiatry team.  Dr Charlie Patterson

## 2024-05-03 NOTE — [facility] Suicide Risk Assessment (Cosign Needed Addendum)
 [facility] Discharge Suicide Risk Assessment   Principal Problem: Rectal bleeding Discharge Diagnoses: Principal Problem:   Rectal bleeding Active Problems:   Diverticulitis of colon with perforation   Alcohol dependence with alcohol-induced mood disorder (HCC)   Major depressive disorder, recurrent severe without psychotic features (HCC)   Mood disorder   Blood in stool   Diverticulosis   Essential hypertension   Obesity (BMI 30-39.9)   Acute right ankle pain   Total Time spent with patient: 30 minutes  Musculoskeletal: Strength & Muscle Tone: within normal limits Gait & Station: normal Patient leans: N/A  Psychiatric Specialty Exam  Presentation  General Appearance:  Appropriate for Environment  Eye Contact: Good  Speech: Clear and Coherent  Speech Volume: Normal  Handedness:No data recorded  Mood and Affect  Mood: Euthymic  Duration of Depression Symptoms: Less than two weeks  Affect: Appropriate   Thought Process  Thought Processes: Coherent  Descriptions of Associations:Intact  Orientation:Full (Time, Place and Person)  Thought Content:WDL  History of Schizophrenia/Schizoaffective disorder:No  Duration of Psychotic Symptoms:No data recorded Hallucinations:Hallucinations: None  Ideas of Reference:None  Suicidal Thoughts:Suicidal Thoughts: No  Homicidal Thoughts:Homicidal Thoughts: No   Sensorium  Memory: Immediate Fair; Recent Fair; Remote Fair  Judgment: Poor  Insight: Fair   Chartered Certified Accountant: Fair  Attention Span: Fair  Recall: [employer] of Knowledge: Fair  Language: Fair   Psychomotor Activity  Psychomotor Activity: Psychomotor Activity: Normal   Assets  Assets: Communication Skills; Desire for Improvement   Sleep  Sleep: Sleep: Fair  Estimated Sleeping Duration (Last 24 Hours): 6.75-8.00 hours  Physical Exam: Physical Exam ROS Blood pressure (!) 141/98, pulse 80, temperature  98.7 F (37.1 C), resp. rate 18, height 6' 1 (1.854 m), weight 109.5 kg, SpO2 100%. Body mass index is 31.86 kg/m.  Mental Status Per Nursing Assessment::   On Admission:  NA  Demographic Factors:  Male  Loss Factors: Financial problems/change in socioeconomic status  Historical Factors: Impulsivity  Risk Reduction Factors:   Positive social support  Continued Clinical Symptoms:  Depression:   Impulsivity  Cognitive Features That Contribute To Risk:  None    Suicide Risk:  Moderate:  Frequent suicidal ideation with limited intensity, and duration, some specificity in terms of plans, no associated intent, good self-control, limited dysphoria/symptomatology, some risk factors present, and identifiable protective factors, including available and accessible social support.    Plan Of Care/Follow-up recommendations:  Activity:  as tolerated  Camelia LITTIE Lukes, PA-C 05/03/2024, 8:39 AM

## 2024-05-03 NOTE — Consult Note (Signed)
 [facility] SURGICAL CONSULTATION NOTE (initial) - cpt: 99254   HISTORY OF PRESENT ILLNESS (HPI):  38 y.o. male initially admitted to [facility] secondary to suicide attempt (pill ingestion) on 10/24. During his admission, he was noted to have blood per rectum while in [facility]. Medicine and gastroenterology were consulted. He did have CT Abdomen/Pelvis yesterday concerning for stable, small, foci of air likely related to his multiple admission for diverticulitis. He is well known to our service for the same. He did have percutaneous drain placement on 09/10 and this was removed on 09/16 as he is homeless. It appears in the interim he has had psychiatric issues/admissions. This afternoon, I did see him on the floor after transfer. He was quite agitated. He reports he continues to have intermittent crampy abdominal pain. No fever, chills, nausea, emesis. He does not occasional blood mixed in his stool from time to time. He report primarily diarrhea. He did have a CBC today which showed normal WBC to 6.1K, Hgb to 14.3 which is markedly normal and actually up in last 48 hours. He will be started on meropenem  here given resistances noted in Cx from 09/10.   Surgery is consulted by hospitalist physician Dr. Charlie Patterson, MD in this context for evaluation and management of diverticulitis.  PAST MEDICAL HISTORY (PMH):  Past Medical History:  Diagnosis Date   Alcohol abuse    Diverticulitis    Substance abuse (HCC)      PAST SURGICAL HISTORY (PSH):  Past Surgical History:  Procedure Laterality Date   ANKLE SURGERY     CHEST TUBE INSERTION     LITHOTRIPSY     TIBIA FRACTURE SURGERY     TONSILLECTOMY AND ADENOIDECTOMY       MEDICATIONS:  Prior to Admission medications   Medication Sig Start Date End Date Taking? Authorizing Provider  amLODipine  (NORVASC ) 5 MG tablet Take 1 tablet (5 mg total) by mouth daily. 04/19/24 04/19/25  Hunter, Crystal L, PA-C  ARIPiprazole (ABILIFY) 5 MG tablet  Take 1 tablet (5 mg total) by mouth at bedtime. 05/03/24   Jadapalle, Sree, MD  chlordiazePOXIDE  (LIBRIUM ) 25 MG capsule Take 1 capsule (25 mg total) by mouth daily. 05/04/24   Jadapalle, Sree, MD  ciprofloxacin  (CIPRO ) 500 MG tablet Take 1 tablet (500 mg total) by mouth 2 (two) times daily. 05/03/24   Jadapalle, Sree, MD  FLUoxetine (PROZAC) 10 MG capsule Take 1 capsule (10 mg total) by mouth daily. 05/04/24   Donnelly Mellow, MD  folic acid  (FOLVITE ) 1 MG tablet Take 1 tablet (1 mg total) by mouth daily. Patient not taking: Reported on 04/29/2024 04/20/24   Hunter, Crystal L, PA-C  gabapentin (NEURONTIN) 300 MG capsule Take 1 capsule (300 mg total) by mouth 3 (three) times daily. 05/03/24   Jadapalle, Sree, MD  metroNIDAZOLE  (FLAGYL ) 500 MG tablet Take 1 tablet (500 mg total) by mouth every 12 (twelve) hours. 05/03/24   Jadapalle, Sree, MD  Multiple Vitamin (MULTIVITAMIN WITH MINERALS) TABS tablet Take 1 tablet by mouth daily. Patient not taking: Reported on 04/29/2024 04/20/24   Hunter, Crystal L, PA-C  pantoprazole  (PROTONIX ) 40 MG tablet Take 1 tablet (40 mg total) by mouth daily. 05/04/24   Jadapalle, Sree, MD  thiamine  (VITAMIN B-1) 100 MG tablet Take 1 tablet (100 mg total) by mouth daily. 05/04/24   Donnelly Mellow, MD     ALLERGIES:  Allergies  Allergen Reactions   Latex      SOCIAL HISTORY:  Social History   Socioeconomic History  Marital status: Single    Spouse name: Not on file   Number of children: Not on file   Years of education: Not on file   Highest education level: Not on file  Occupational History   Not on file  Tobacco Use   Smoking status: Every Day    Current packs/day: 1.00    Average packs/day: 1 pack/day for 1.2 years (1.2 ttl pk-yrs)    Types: Cigarettes    Start date: 03/08/2023    Passive exposure: Current   Smokeless tobacco: Never  Substance and Sexual Activity   Alcohol use: Yes    Alcohol/week: 35.0 standard drinks of alcohol    Types: 35  Shots of liquor per week    Comment: 2 fifths a day   Drug use: Yes    Types: Marijuana   Sexual activity: Not Currently  Other Topics Concern   Not on file  Social History Narrative   Not on file   Social Drivers of Health   Financial Resource Strain: Not on file  Food Insecurity: Food Insecurity Present (04/30/2024)   Hunger Vital Sign    Worried About Running Out of Food in the Last Year: Often true    Ran Out of Food in the Last Year: Often true  Transportation Needs: Patient Declined (04/30/2024)   PRAPARE - Administrator, Civil Service (Medical): Patient declined    Lack of Transportation (Non-Medical): Patient declined  Recent Concern: Transportation Needs - Unmet Transportation Needs (04/09/2024)   PRAPARE - Administrator, Civil Service (Medical): Yes    Lack of Transportation (Non-Medical): Yes  Physical Activity: Not on file  Stress: Not on file  Social Connections: Socially Isolated (03/31/2024)   Social Connection and Isolation Panel    Frequency of Communication with Friends and Family: Once a week    Frequency of Social Gatherings with Friends and Family: Never    Attends Religious Services: Never    Database Administrator or Organizations: No    Attends Banker Meetings: Never    Marital Status: Never married  Intimate Partner Violence: Patient Declined (04/30/2024)   Humiliation, Afraid, Rape, and Kick questionnaire    Fear of Current or Ex-Partner: Patient declined    Emotionally Abused: Patient declined    Physically Abused: Patient declined    Sexually Abused: Patient declined     FAMILY HISTORY:  No family history on file.    REVIEW OF SYSTEMS:  Review of Systems  Constitutional:  Negative for chills and fever.  Respiratory:  Negative for cough and shortness of breath.   Cardiovascular:  Negative for chest pain and palpitations.  Gastrointestinal:  Positive for abdominal pain, blood in stool and diarrhea.  Negative for constipation, nausea and vomiting.  Genitourinary:  Negative for dysuria and urgency.  All other systems reviewed and are negative.   VITAL SIGNS:  Temp:  [97.9 F (36.6 C)-98.7 F (37.1 C)] 97.9 F (36.6 C) (10/28 0955) Pulse Rate:  [77-93] 93 (10/28 0955) Resp:  [16-18] 16 (10/28 0955) BP: (130-156)/(90-98) 156/93 (10/28 0955) SpO2:  [98 %-100 %] 98 % (10/28 0955)             INTAKE/OUTPUT:  No intake/output data recorded.  PHYSICAL EXAM:  Physical Exam Vitals and nursing note reviewed. Exam conducted with a chaperone present.  Constitutional:      General: He is not in acute distress.    Appearance: Normal appearance. He is normal weight.  Comments: Standing up in room; NAD Initially argumentative with staff; able to calm down and have reasonable discussion  HENT:     Head: Normocephalic and atraumatic.  Eyes:     General: No scleral icterus.    Conjunctiva/sclera: Conjunctivae normal.  Cardiovascular:     Rate and Rhythm: Normal rate.     Pulses: Normal pulses.     Heart sounds: No murmur heard. Pulmonary:     Effort: Pulmonary effort is normal. No respiratory distress.  Abdominal:     General: Abdomen is flat. There is no distension.     Palpations: Abdomen is soft.     Tenderness: There is abdominal tenderness in the left lower quadrant. There is no guarding or rebound.     Comments: Abdomen appears to be at his baseline, he is sore in LLQ, non-distended, certainly not peritonitic   Skin:    General: Skin is warm and dry.     Coloration: Skin is not pale.  Neurological:     General: No focal deficit present.     Mental Status: He is alert. Mental status is at baseline.      Labs:     Latest Ref Rng & Units 05/03/2024    6:27 AM 05/01/2024    8:48 PM 04/29/2024    9:35 PM  CBC  WBC 4.0 - 10.5 K/uL 6.1  7.8  8.6   Hemoglobin 13.0 - 17.0 g/dL 85.6  86.1  86.7   Hematocrit 39.0 - 52.0 % 41.8  40.2  38.9   Platelets 150 - 400 K/uL 163   180  211       Latest Ref Rng & Units 05/01/2024   10:22 AM 04/29/2024    9:35 PM 04/11/2024    8:41 AM  CMP  Glucose 70 - 99 mg/dL 897  92  99   BUN 6 - 20 mg/dL 9  10  12    Creatinine 0.61 - 1.24 mg/dL 9.41  9.46  9.20   Sodium 135 - 145 mmol/L 139  137  139   Potassium 3.5 - 5.1 mmol/L 4.3  3.0  4.3   Chloride 98 - 111 mmol/L 103  100  104   CO2 22 - 32 mmol/L 24  20  23    Calcium 8.9 - 10.3 mg/dL 9.3  8.6  9.4   Total Protein 6.5 - 8.1 g/dL 7.8  7.6    Total Bilirubin 0.0 - 1.2 mg/dL 0.9  0.7    Alkaline Phos 38 - 126 U/L 103  103    AST 15 - 41 U/L 39  31    ALT 0 - 44 U/L 35  32       Imaging studies:   CT Abdomen/Pelvis (05/02/2024) personally reviewed with similar appearance of small air/fluid collection, history of diverticulitis, and radiologist report reviewed below:  IMPRESSION: 1. Sigmoid colon diverticulitis with adjacent 1.9 x 1.9 x 2.0 cm air-fluid collection concerning for abscess/ contained perforation. Fluid collection similar in size and location compared to prior study. 2. Sigmoid diverticulosis.    Assessment/Plan:  38 y.o. male with recurrent vs persistent diverticular abscess vs foci of extraluminal air, small and very stable, complicated by social barriers and psychiatric history.   - Appreciate medicine admission - Appreciate psychiatric continued evaluation; he is currently IVC   - Agree with IV Abx; Meropenem  is reasonable given previous Cx results - I do not think this foci of air is significantly worse compared to previous examinations. This is  too small and protected by colon to be amenable to percutaneous drainage nor aspiration.  - Right now, his abdominal examination is reassuring, he is without peritonitis and WBC and Hgb are markedly normal. I do no think he warrants any emergent surgical interventions. This is however now his 3rd admission for the same in the last month, and I do think we can potentially consider surgical resection at some  point this admission. He understands there is a significant risk for ostomy bag (most likely resection with EES and diverting loop ileostomy). We will follow his progress. We can potentially prep him and plan for this next week with goal of avoiding ostomy if feasible. We will continue to reassess - CLD for now; monitor tolerance - Monitor abdominal examination; on-going bowel function  - Pain control prn; antiemetics prn - Further management per primary service; we will follow    All of the above findings and recommendations were discussed with the patient, and all of patient's questions were answered to his expressed satisfaction.  Thank you for the opportunity to participate in this patient's care.   -- Arthea Platt, PA-C [facility] 05/03/2024, 9:57 AM M-F: 7am - 4pm

## 2024-05-03 NOTE — Plan of Care (Signed)

## 2024-05-03 NOTE — Assessment & Plan Note (Signed)
BMI 31.86

## 2024-05-03 NOTE — Consult Note (Signed)
 [facility] Psychiatric Consult Initial  Patient Name: .[facility]  MRN: [id number]  DOB: 05-Mar-1986  Consult Order details:  Orders (From admission, onward)     Start     Ordered   05/03/24 1003  IP CONSULT TO PSYCHIATRY       Comments: [facility]  Ordering Provider: Josette Ade, MD  Provider:  (Not yet assigned)  Question Answer Comment  Location [facility]   Reason for Consult? suicidal ideation      05/03/24 1002             Mode of Visit: In person    Psychiatry Consult Evaluation  Service Date: May 03, 2024 LOS:  LOS: 0 days  Chief Complaint I may have to have surgery  Primary Psychiatric Diagnoses  Suicidal ideation alcoholism   Assessment  [facility] is a 38 y.o. male admitted: Medically  Patient was transferred to the medical floor from inpatient psychiatric unit at [facility] due to findings on CT scan.  Patient was rounded on today by this provider.  Patient currently denied suicidal homicidal ideation.  He denied any auditory or visual hallucinations.  His main concern was potentially about having surgery and care plan once transferred to the medical floor.  Patient reported that [facility] told him that he would be accepted after he got his medical issues addressed.  At this time, patient is currently being medically admitted for further workup of symptoms.  Psychiatry will continue to round on patient while on the medical floor.  Diagnoses:  Active Hospital problems: Principal Problem:   Colonic diverticular abscess    Plan   ## Psychiatric Medication Recommendations:  Continue current medications from inpatient unit  ## Medical Decision Making Capacity: Not specifically addressed in this encounter     ## Disposition:--Psychiatry will continue to round on patient while on the medical floor  ## Behavioral / Environmental: - No specific recommendations at this time.     ## Safety  and Observation Level:  - Based on my clinical evaluation, I estimate the patient to be at moderate risk of self harm in the current setting. - At this time, we recommend  1:1 Observation. This decision is based on my review of the chart including patient's history and current presentation, interview of the patient, mental status examination, and consideration of suicide risk including evaluating suicidal ideation, plan, intent, suicidal or self-harm behaviors, risk factors, and protective factors. This judgment is based on our ability to directly address suicide risk, implement suicide prevention strategies, and develop a safety plan while the patient is in the clinical setting. Please contact our team if there is a concern that risk level has changed.  CSSR Risk Category:C-SSRS RISK CATEGORY: Moderate Risk  Suicide Risk Assessment: Patient has following modifiable risk factors for suicide: recklessness, which we are addressing by psychiatry continuing to round on patient while being medically admitted. Patient has following non-modifiable or demographic risk factors for suicide: male gender and psychiatric hospitalization Patient has the following protective factors against suicide: Access to outpatient mental health care  Thank you for this consult request. Recommendations have been communicated to the primary team.  [name]   Christian Sharps, NP        History of Present Illness  Relevant Aspects of [facility]   Patient Report: Obtained from initial H&P on inpatient psych unit Patient is a 38 year old male with  a past psychiatric history of alcohol use disorder who presented to the [facility] ED complaining of suicide attempt by taking Twenty Nine 5 mg BP pills with two 40 oz beers. Throughout evaluation in ED, patient's report was noted to be inconsistent with stating he overdosed then denying he overdosed.    Patient has a long standing history of  alcohol abuse starting about 8-9 years ago. He reports it increased after his wife left with his kids. He has a history of a DUI, court case still pending. He also reports being in jail previously for 10 years.    Patient reports he came in for his ankle hurting and then the ED found out about him taking twenty nine blood pressure pills in attempt to end life. Patient reported it was a suicide attempt. He also then identified he was worried about his foot swelling and hurting so he wanted to get treatment.    Patient reports he did not stay at the facility he was sent to because he did not want to work for no money. He reports he wants to go somewhere he can rebuilt my life. Patient identifies being homeless.    Upon presentation to the ED the patient was medically cleared for reported fall and swelling to right ankle. Right ankle continues to be swollen with patient reporting pain. [facility] consulted and directed to Podiatry. Podiatry  (Christian. Alyson) reviewed images and recommended CT scan and a walking boot (if possible on unit).    Patient currently denies SI, HI, and AVH.    From IRIS consultation:  Patient refused to do an interview with me.  He stated that he lied about having overdosed because I'm homeless, and I knew that was what I needed to say.  However, he then said that he didn't want to stay now and demanded to leave.   I reminded patient that he had told several individuals that he had overdosed, including to Christian. [place].  According to [facility], he said the following:   He reported that "I tried to kill myself by taking too much blood pressure medicine". He shared that he took 45 lodatrines pills. He stated "My life is shit and I feel this way everyday". [facility] reports symptoms of depression "I just don't give a fuck, I just don't care". He shared that he does not know his feelings. He denied symptoms of anxiety. He denied having auditory or visual hallucinations. When asked  homicidal ideation or intent, he stated "I'm not sure". He was unsure of whether he wanted to harm himself further.    Furthermore, the patient had just been discharged from Psychiatry on October 11, after he had been admitted for suicidal and homicidal ideation.  There was some indication that alcohol intoxication had played some role in his initial presentation, but patient also told staff that he'd had a diagnosis of bipolar disorder when he was younger, although he had never been treated.  Patient's mood instability was such that his provider, Ms. Katrinka RIGGERS, felt that he met criteria for a trial of Abilify (a mood stabilizer), Prozac (for depression and anxiety) and Neurontin (for anxiety).  Patient appeared to have no problems with the meds, but at discharge he requested to be taken off the medications, as he was planning to go to a faith-based sober living facility, where, he reported, he could not take any psychotropic medication (As of this writing, it is unclear whether he attended that program or how he left the  program, if he did attend)      Psychiatric and Social History  Past Psychiatric History: Alcohol use disorder Psychiatric History:  Information collected from chart and patient   Prev Dx/Sx: Alcohol use Disorder Current Psych Provider: None Home Meds (current):  Previous Med Trials: Ritalin, Wellbutrin, lithium, Seroquel  Therapy: None   Prior Psych Hospitalization: [facility] 04/09/24 - 04/19/24 Prior Self Harm: Denies Prior Violence: Endorses   Family Psych History: Schizophrenia (paternal aunt) Family Hx suicide: Denies   Social History:  Educational Hx: GED 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, previous jail time for 10 years Living Situation: Homeless Spiritual Hx: Religious- suicide against beliefs Access to weapons/lethal means: Denies   Substance History Alcohol: Daily use for several years Type of alcohol liquor Last Drink  04/29/24 Number of drinks per day several History of alcohol withdrawal seizures: Yes History of DT's: Denies Tobacco: Cigarettes Illicit drugs: Marijuana Prescription drug abuse: Endorses previous abuse of prescription drugs (Ritalin, Wellbutrin, Zoloft, others) but not recent  Rehab hx: Denies  Exam Findings  Physical Exam: Deferred to medical team note reviewed Vital Signs:  Temp:  [97.9 F (36.6 C)-98.7 F (37.1 C)] 97.9 F (36.6 C) (10/28 0955) Pulse Rate:  [77-93] 93 (10/28 0955) Resp:  [16-18] 16 (10/28 0955) BP: (130-156)/(90-98) 156/93 (10/28 0955) SpO2:  [98 %-100 %] 98 % (10/28 0955) There were no vitals taken for this visit. There is no height or weight on file to calculate BMI.    Mental Status Exam: General Appearance: Casual  Orientation:  Full (Time, Place, and Person)  Memory:  Immediate;   Fair Recent;   Fair Remote;   Fair  Concentration:  Concentration: Fair and Attention Span: Fair  Recall:  Fair  Attention  Fair  Eye Contact:  Fair  Speech:  Clear and Coherent  Language:  Fair  Volume:  Normal  Mood: better than last night  Affect:  Congruent  Thought Process:  Coherent  Thought Content:  Logical  Suicidal Thoughts:  No  Homicidal Thoughts:  No  Judgement:  Impaired  Insight:  Lacking and Shallow  Psychomotor Activity:  Normal  Akathisia:  No  Fund of Knowledge:  Fair      Assets:  Communication Skills Desire for Improvement  Cognition:  WNL  ADL's:  Intact  AIMS (if indicated):        Other History   These have been pulled in through the EMR, reviewed, and updated if appropriate.  Family History:  The patient's family history is not on file.  Medical History: Past Medical History:  Diagnosis Date  . Alcohol abuse   . Diverticulitis   . Substance abuse [facility])     Surgical History: Past Surgical History:  Procedure Laterality Date  . ANKLE SURGERY    . CHEST TUBE INSERTION    . LITHOTRIPSY    . TIBIA FRACTURE SURGERY     . TONSILLECTOMY AND ADENOIDECTOMY       Medications:   Current Facility-Administered Medications:  .  acetaminophen  (TYLENOL ) tablet 650 mg, 650 mg, Oral, Q6H PRN **OR** acetaminophen  (TYLENOL ) suppository 650 mg, 650 mg, Rectal, Q6H PRN, Christian, Richard, MD .  NOREEN ON 05/04/2024] amLODipine  (NORVASC ) tablet 5 mg, 5 mg, Oral, Daily, Hunter, Crystal L, PA-C .  ARIPiprazole (ABILIFY) tablet 5 mg, 5 mg, Oral, QHS, Wieting, Richard, MD .  NOREEN ON 05/04/2024] chlordiazePOXIDE  (LIBRIUM ) capsule 25 mg, 25 mg, Oral, Daily, Christian, Richard, MD .  NOREEN ON 05/04/2024] FLUoxetine (PROZAC) capsule  10 mg, 10 mg, Oral, Daily, Hunter, Crystal L, PA-C .  [START ON 05/04/2024] folic acid  (FOLVITE ) tablet 1 mg, 1 mg, Oral, Daily, Hunter, Crystal L, PA-C .  gabapentin (NEURONTIN) capsule 300 mg, 300 mg, Oral, TID, Hunter, Crystal L, PA-C .  meropenem  (MERREM ) 1 g in sodium chloride  0.9 % 100 mL IVPB, 1 g, Intravenous, Q8H, Elesa Perkins, RPH .  [START ON 05/04/2024] multivitamin with minerals tablet 1 tablet, 1 tablet, Oral, Daily, Hunter, Crystal L, PA-C .  [START ON 05/04/2024] nicotine  (NICODERM CQ  - dosed in mg/24 hours) patch 14 mg, 14 mg, Transdermal, Daily, Hunter, Crystal L, PA-C .  ondansetron  (ZOFRAN ) tablet 4 mg, 4 mg, Oral, Q6H PRN **OR** ondansetron  (ZOFRAN ) injection 4 mg, 4 mg, Intravenous, Q6H PRN, Wieting, Richard, MD .  oxyCODONE  (Oxy IR/ROXICODONE ) immediate release tablet 5 mg, 5 mg, Oral, Q4H PRN, Christian Ade, MD .  NOREEN ON 05/04/2024] pantoprazole  (PROTONIX ) EC tablet 40 mg, 40 mg, Oral, Daily, Hunter, Crystal L, PA-C .  [START ON 05/04/2024] thiamine  (VITAMIN B1) tablet 100 mg, 100 mg, Oral, Daily, Hunter, Crystal L, PA-C  Allergies: Allergies  Allergen Reactions  . Latex     Christian Sharps, NP

## 2024-05-03 NOTE — Progress Notes (Signed)
 Pt became very agitated when told he had to use a paper bag.  Pt stated, Fuck no.  You better ask somebody what I can do.  Security guard, Administrator, Civil Service assisted pt with removing his belongings from his room.  Pt is d/cing to room 219 on medical floor here at [facility] due to diverticulitis and rectal bleeding requiring surgery potentially.

## 2024-05-03 NOTE — Progress Notes (Signed)
  [facility] Adult Case Management Discharge Plan :  Will you be returning to the same living situation after discharge:  No. Patient discharge to medical floor.  At discharge, do you have transportation home?: Patient discharge to medical floor.  Do you have the ability to pay for your medications: Patient discharge to medical floor.   Release of information consent forms completed and in the chart;  Patient's signature needed at discharge.  Patient to Follow up at: Patient discharge to medical floor.    Next level of care provider has access to [facility] Link:no  Safety Planning and Suicide Prevention discussed: Yes,  [facility], [phone], sister     Has patient been referred to the Quitline?: Patient discharged to medical floor.   Patient has been referred for addiction treatment: Patient discharged to medical floor.   Christian CHRISTELLA Kerns, LCSW 05/03/2024, 10:57 AM

## 2024-05-03 NOTE — Plan of Care (Signed)
  Problem: Education: Goal: Knowledge of [place] [facility] Education information/materials will improve Outcome: Completed/Met Goal: Emotional status will improve Outcome: Completed/Met Goal: Mental status will improve Outcome: Completed/Met Goal: Verbalization of understanding the information provided will improve Outcome: Completed/Met   Problem: Activity: Goal: Interest or engagement in activities will improve Outcome: Completed/Met Goal: Sleeping patterns will improve Outcome: Completed/Met   Problem: Coping: Goal: Ability to verbalize frustrations and anger appropriately will improve Outcome: Completed/Met Goal: Ability to demonstrate self-control will improve Outcome: Completed/Met   Problem: Health Behavior/Discharge Planning: Goal: Identification of resources available to assist in meeting health care needs will improve Outcome: Completed/Met Goal: Compliance with treatment plan for underlying cause of condition will improve Outcome: Completed/Met   Problem: Physical Regulation: Goal: Ability to maintain clinical measurements within normal limits will improve Outcome: Completed/Met   Problem: Safety: Goal: Periods of time without injury will increase Outcome: Completed/Met   Problem: Education: Goal: Ability to state activities that reduce stress will improve Outcome: Completed/Met   Problem: Coping: Goal: Ability to identify and develop effective coping behavior will improve Outcome: Completed/Met   Problem: Self-Concept: Goal: Ability to identify factors that promote anxiety will improve Outcome: Completed/Met Goal: Level of anxiety will decrease Outcome: Completed/Met Goal: Ability to modify response to factors that promote anxiety will improve Outcome: Completed/Met   Problem: Education: Goal: Utilization of techniques to improve thought processes will improve Outcome: Completed/Met Goal: Knowledge of the prescribed therapeutic regimen will  improve Outcome: Completed/Met   Problem: Activity: Goal: Interest or engagement in leisure activities will improve Outcome: Completed/Met Goal: Imbalance in normal sleep/wake cycle will improve Outcome: Completed/Met   Problem: Coping: Goal: Coping ability will improve Outcome: Completed/Met Goal: Will verbalize feelings Outcome: Completed/Met   Problem: Health Behavior/Discharge Planning: Goal: Ability to make decisions will improve Outcome: Completed/Met Goal: Compliance with therapeutic regimen will improve Outcome: Completed/Met   Problem: Role Relationship: Goal: Will demonstrate positive changes in social behaviors and relationships Outcome: Completed/Met   Problem: Safety: Goal: Ability to disclose and discuss suicidal ideas will improve Outcome: Completed/Met Goal: Ability to identify and utilize support systems that promote safety will improve Outcome: Completed/Met   Problem: Self-Concept: Goal: Will verbalize positive feelings about self Outcome: Completed/Met Goal: Level of anxiety will decrease Outcome: Completed/Met   Problem: Education: Goal: Ability to make informed decisions regarding treatment will improve Outcome: Completed/Met   Problem: Coping: Goal: Coping ability will improve Outcome: Completed/Met   Problem: Health Behavior/Discharge Planning: Goal: Identification of resources available to assist in meeting health care needs will improve Outcome: Completed/Met   Problem: Medication: Goal: Compliance with prescribed medication regimen will improve Outcome: Completed/Met   Problem: Self-Concept: Goal: Ability to disclose and discuss suicidal ideas will improve Outcome: Completed/Met Goal: Will verbalize positive feelings about self Outcome: Completed/Met Note: Patient is on track. Patient will avoid flare triggers

## 2024-05-03 NOTE — Assessment & Plan Note (Signed)
 Patient is hemoglobin stable.  Likely secondary to diverticular abscess

## 2024-05-03 NOTE — Progress Notes (Signed)
   05/03/24 0800  Psych Admission Type (Psych Patients Only)  Admission Status Involuntary  Psychosocial Assessment  Patient Complaints None  Eye Contact Fair  Facial Expression Flat  Affect Sad  Speech Logical/coherent  Interaction Assertive  Motor Activity Slow  Appearance/Hygiene Unremarkable  Behavior Characteristics Cooperative  Mood Depressed  Aggressive Behavior  Effect No apparent injury  Thought Process  Coherency WDL  Content WDL  Delusions None reported or observed  Perception WDL  Hallucination None reported or observed  Judgment Poor  Confusion None  Danger to Self  Current suicidal ideation? Denies  Description of Suicide Plan no plan (pt stated that he wants to come back to this floor so he can get assistance so he wont be homeless when he leaves)  Self-Injurious Behavior No self-injurious ideation or behavior indicators observed or expressed   Agreement Not to Harm Self Yes  Description of Agreement Verbal  Danger to Others  Danger to Others None reported or observed

## 2024-05-03 NOTE — Progress Notes (Signed)
 Patient just arrived to the unit making verbal threats about leaving. Security, attending, and management at bedside.  Patient has clothes being washed downstairs to be brought back up to him. Management aware. Patient also has two other bags at nursing station with his belongings.

## 2024-05-03 NOTE — Assessment & Plan Note (Addendum)
 With mood disorder.  Patient still under IVC on Abilify and Prozac

## 2024-05-03 NOTE — H&P (Signed)
 History and Physical    Patient: Christian Dickerson FMW:[id number] DOB: 1985-10-16 DOA: 05/03/2024 DOS: the patient was seen and examined on 05/03/2024 PCP: Pcp, No  Patient coming from: Psychiatry floor  Chief Complaint: Abdominal pain and rectal bleeding HPI: Christian Dickerson is a 38 y.o. male with medical history significant of alcohol abuse, hypertension and recent hospitalization last month for perforated diverticulitis requiring antibiotics and drainage tube.  Patient came in and was admitted to the psychiatric unit on 1026 for alcohol abuse and suicide attempt.  Medical team was consulted for rectal bleeding.  Patient does have some left lower quadrant abdominal pain.  CT scan of the abdomen pelvis showed sigmoid colon diverticulitis with adjacent 1.9 x 1.9 x 2 point centimeter air-fluid collection concerning for abscess/contained perforation.  Since the patient was still on the psychiatric floor ordered a dose of Cipro  and Flagyl .  Case discussed with general surgery to see in consultation.  Patient transferred to the medical floor for continued medical management.  Started on meropenem  secondary to previous culture showing E. coli with resistance to Zosyn . Review of Systems: Review of Systems  Constitutional:  Negative for chills and fever.  HENT:  Negative for hearing loss.   Eyes:  Negative for blurred vision.  Respiratory:  Negative for cough and shortness of breath.   Cardiovascular:  Negative for chest pain.  Gastrointestinal:  Positive for abdominal pain and blood in stool. Negative for nausea and vomiting.  Genitourinary:  Negative for dysuria.  Musculoskeletal:  Negative for myalgias.  Skin:  Negative for rash.  Neurological:  Negative for dizziness.  Endo/Heme/Allergies:  Does not bruise/bleed easily.  Psychiatric/Behavioral:  Negative for depression.     Past Medical History:  Diagnosis Date   Alcohol abuse    Diverticulitis    Hypertension    Substance abuse (HCC)     Past Surgical History:  Procedure Laterality Date   ANKLE SURGERY     CHEST TUBE INSERTION     LITHOTRIPSY     TIBIA FRACTURE SURGERY     TONSILLECTOMY AND ADENOIDECTOMY     Social History:  reports that he has been smoking cigarettes. He started smoking about 13 months ago. He has a 1.2 pack-year smoking history. He has been exposed to tobacco smoke. He has never used smokeless tobacco. He reports current alcohol use of about 35.0 standard drinks of alcohol per week. He reports current drug use. Drug: Marijuana.  Allergies  Allergen Reactions   Latex     Family History  Problem Relation Age of Onset   Heart attack Mother    Heart attack Father     Prior to Admission medications   Medication Sig Start Date End Date Taking? Authorizing Provider  amLODipine  (NORVASC ) 5 MG tablet Take 1 tablet (5 mg total) by mouth daily. 04/19/24 04/19/25  Hunter, Crystal L, PA-C  ARIPiprazole (ABILIFY) 5 MG tablet Take 1 tablet (5 mg total) by mouth at bedtime. 05/03/24   Jadapalle, Sree, MD  chlordiazePOXIDE  (LIBRIUM ) 25 MG capsule Take 1 capsule (25 mg total) by mouth daily. 05/04/24   Donnelly Mellow, MD  ciprofloxacin  (CIPRO ) 500 MG tablet Take 1 tablet (500 mg total) by mouth 2 (two) times daily. 05/03/24   Jadapalle, Sree, MD  FLUoxetine (PROZAC) 10 MG capsule Take 1 capsule (10 mg total) by mouth daily. 05/04/24   Donnelly Mellow, MD  folic acid  (FOLVITE ) 1 MG tablet Take 1 tablet (1 mg total) by mouth daily. Patient not taking: Reported on 04/29/2024  04/20/24   Hunter, Crystal L, PA-C  gabapentin (NEURONTIN) 300 MG capsule Take 1 capsule (300 mg total) by mouth 3 (three) times daily. 05/03/24   Jadapalle, Sree, MD  metroNIDAZOLE  (FLAGYL ) 500 MG tablet Take 1 tablet (500 mg total) by mouth every 12 (twelve) hours. 05/03/24   Jadapalle, Sree, MD  Multiple Vitamin (MULTIVITAMIN WITH MINERALS) TABS tablet Take 1 tablet by mouth daily. Patient not taking: Reported on 04/29/2024 04/20/24    Katrinka Salines L, PA-C  pantoprazole  (PROTONIX ) 40 MG tablet Take 1 tablet (40 mg total) by mouth daily. 05/04/24   Jadapalle, Sree, MD  thiamine  (VITAMIN B-1) 100 MG tablet Take 1 tablet (100 mg total) by mouth daily. 05/04/24   Donnelly Mellow, MD    Physical Exam: Physical Exam HENT:     Head: Normocephalic.  Eyes:     General: Lids are normal.     Conjunctiva/sclera: Conjunctivae normal.  Cardiovascular:     Rate and Rhythm: Normal rate and regular rhythm.     Heart sounds: Normal heart sounds, S1 normal and S2 normal.  Pulmonary:     Breath sounds: No decreased breath sounds, wheezing, rhonchi or rales.  Abdominal:     Palpations: Abdomen is soft.     Tenderness: There is abdominal tenderness in the left lower quadrant.  Musculoskeletal:     Right lower leg: No swelling.     Left lower leg: No swelling.  Skin:    General: Skin is warm.     Findings: No rash.  Neurological:     Mental Status: He is alert and oriented to person, place, and time.     Data Reviewed: White blood cell count 6.1, hemoglobin 14.3, MCV 102 and platelet count 163.  Assessment and Plan: * Colonic diverticular abscess Likely this is persistent diverticular abscess that never improved.  Patient given a dose of Cipro  and Flagyl  this morning while on the psychiatric floor.  Will change antibiotics over to meropenem  based on previous culture results last month growing E. coli with resistance to Zosyn .  Case discussed with general surgery team to follow.  Will place on clear liquid diet  Rectal bleeding Patient is hemoglobin stable.  Likely secondary to diverticular abscess  Alcoholism (HCC) Librium  taper, thiamine , folic acid  and multivitamin.  Must stop alcohol.  Tobacco abuse Nicotine  patch  Obesity (BMI 30-39.9) BMI 31.86  Essential hypertension Continue Norvasc   Suicidal ideation With mood disorder.  Patient still under IVC on Abilify and Prozac      Advance Care Planning:   Code  Status: Full Code   Consults: General Surgery and psychiatry    Severity of Illness: The appropriate patient status for this patient is INPATIENT. Inpatient status is judged to be reasonable and necessary in order to provide the required intensity of service to ensure the patient's safety. The patient's presenting symptoms, physical exam findings, and initial radiographic and laboratory data in the context of their chronic comorbidities is felt to place them at high risk for further clinical deterioration. Furthermore, it is not anticipated that the patient will be medically stable for discharge from the hospital within 2 midnights of admission.   * I certify that at the point of admission it is my clinical judgment that the patient will require inpatient hospital care spanning beyond 2 midnights from the point of admission due to high intensity of service, high risk for further deterioration and high frequency of surveillance required.*  Author: Charlie Patterson, MD 05/03/2024 1:27 PM  For  on call review [email or web address].

## 2024-05-03 NOTE — Assessment & Plan Note (Signed)
 Likely this is persistent diverticular abscess that never improved.  Patient given a dose of Cipro  and Flagyl  this morning while on the psychiatric floor.  Will change antibiotics over to meropenem  based on previous culture results last month growing E. coli with resistance to Zosyn .  Case discussed with general surgery team to follow.  Will place on clear liquid diet

## 2024-05-03 NOTE — Assessment & Plan Note (Signed)
 Librium  taper, thiamine , folic acid  and multivitamin.  Must stop alcohol.

## 2024-05-04 ENCOUNTER — Inpatient Hospital Stay

## 2024-05-04 DIAGNOSIS — K625 Hemorrhage of anus and rectum: Secondary | ICD-10-CM | POA: Diagnosis not present

## 2024-05-04 DIAGNOSIS — F1020 Alcohol dependence, uncomplicated: Secondary | ICD-10-CM | POA: Diagnosis not present

## 2024-05-04 DIAGNOSIS — K5720 Diverticulitis of large intestine with perforation and abscess without bleeding: Secondary | ICD-10-CM

## 2024-05-04 LAB — SEDIMENTATION RATE: Sed Rate: 15 mm/h (ref 0–15)

## 2024-05-04 LAB — COMPREHENSIVE METABOLIC PANEL WITH GFR
ALT: 33 U/L (ref 0–44)
AST: 30 U/L (ref 15–41)
Albumin: 3.3 g/dL — ABNORMAL LOW (ref 3.5–5.0)
Alkaline Phosphatase: 71 U/L (ref 38–126)
Anion gap: 10 (ref 5–15)
BUN: 9 mg/dL (ref 6–20)
CO2: 27 mmol/L (ref 22–32)
Calcium: 8.8 mg/dL — ABNORMAL LOW (ref 8.9–10.3)
Chloride: 103 mmol/L (ref 98–111)
Creatinine, Ser: 0.68 mg/dL (ref 0.61–1.24)
GFR, Estimated: >60 mL/min (ref >60)
Glucose, Bld: 92 mg/dL (ref 70–99)
Potassium: 4.2 mmol/L (ref 3.5–5.1)
Sodium: 140 mmol/L (ref 135–145)
Total Bilirubin: 0.5 mg/dL (ref 0.0–1.2)
Total Protein: 7 g/dL (ref 6.5–8.1)

## 2024-05-04 LAB — CBC
HCT: 40.8 % (ref 39.0–52.0)
Hemoglobin: 13.8 g/dL (ref 13.0–17.0)
MCH: 35.2 pg — ABNORMAL HIGH (ref 26.0–34.0)
MCHC: 33.8 g/dL (ref 30.0–36.0)
MCV: 104.1 fL — ABNORMAL HIGH (ref 80.0–100.0)
Platelets: 150 K/uL (ref 150–400)
RBC: 3.92 MIL/uL — ABNORMAL LOW (ref 4.22–5.81)
RDW: 14.1 % (ref 11.5–15.5)
WBC: 5.6 K/uL (ref 4.0–10.5)
nRBC: 0 % (ref 0.0–0.2)

## 2024-05-04 LAB — C-REACTIVE PROTEIN: CRP: 0.5 mg/dL (ref <1.0)

## 2024-05-04 LAB — URIC ACID: Uric Acid, Serum: 5 mg/dL (ref 3.7–8.6)

## 2024-05-04 MED ORDER — DOXYCYCLINE HYCLATE 100 MG PO TABS
100.0000 mg | ORAL_TABLET | Freq: Two times a day (BID) | ORAL | Status: DC
  Administered 2024-05-04 – 2024-05-11 (×14): 100 mg via ORAL
  Filled 2024-05-04 (×14): qty 1

## 2024-05-04 MED ORDER — OXYCODONE HCL 5 MG PO TABS
5.0000 mg | ORAL_TABLET | ORAL | Status: DC | PRN
  Administered 2024-05-04 – 2024-05-15 (×30): 10 mg via ORAL
  Administered 2024-05-15: 5 mg via ORAL
  Administered 2024-05-15 – 2024-05-16 (×2): 10 mg via ORAL
  Filled 2024-05-04 (×11): qty 2
  Filled 2024-05-04: qty 1
  Filled 2024-05-04 (×22): qty 2

## 2024-05-04 MED ORDER — TRAZODONE HCL 100 MG PO TABS
100.0000 mg | ORAL_TABLET | Freq: Every evening | ORAL | Status: DC | PRN
Start: 2024-05-04 — End: 2024-05-16
  Administered 2024-05-04 – 2024-05-15 (×12): 100 mg via ORAL
  Filled 2024-05-04 (×12): qty 1

## 2024-05-04 NOTE — Discharge Instructions (Addendum)
 Transportation Resources for [employer]  Agency Name: [facility] Agency Address: [address] [place], [place] [address] Phone: [phone] Email: [email or web address] Website: [email or web address] Service(s) Offered: Housing services, self-sufficiency, congregate meal program, weatherization program, field seismologist program, emergency food assistance,  housing counseling, home ownership program, wheels-towork program.  Agency Name: [facility] [employer] [phone]) Address: [address], [place], [place] [address] Phone: [phone] Website: [email or web address] Service(s) Offered: Transportation for bluelinx, subscription and demand response; Dial-a-Ride for citizens 18 years of age or older.  Agency Name: Department of Social Services Address: [address] [place], [place] [address] Phone: [phone] Service(s) Offered: Child support services; child welfare services; food stamps; Medicaid; work first family assistance; and aid with fuel,  rent, food and medicine, transportation assistance.  Agency Name: Disabled [employer] (DAV) Transportation  Network Phone: [phone] Service(s) Offered: Transports veterans to the [facility]. Call  forty-eight hours in advance and leave the name, telephone  number, date, and time of appointment. Veteran will be  contacted by the driver the day before the appointment to  arrange a pick up point    [employer] ACTA currently provides door to door services. ACTA connects with PART daily for services to [facility]. ACTA also performs contract services to [employer] operates 27 vehicles, all but 3 mini-vans are equipped with lifts for special needs as well as the general public. ACTA drivers are each CDL certified and trained in First Aid and CPR. ACTA was established in 2002 by [employer]. An independent Industrial/product Designer. ACTA operates via cytogeneticist with required local 10% match funding from [place]. ACTA provides over 80,000 passenger trips each year, including [facility] and [employer] sites.  Call at least by 11 AM one business day prior to needing transportation  [employer].                      [place], [place] [address]     Office Hours: Monday-Friday  8 AM - 5 PM    Rent/Utility/Housing  Agency Name: [facility] Agency Address: [address] [place], [place] [address] Phone: [phone] Email: [email or web address] Website: [email or web address] Service(s) Offered: Housing services, self-sufficiency, congregate meal program, weatherization program, field seismologist program, emergency food assistance,  housing counseling, home ownership program, wheels -towork program.  Agency Name: [facility] Address: [address] [place], [place] [address] Phone: [phone] (8a-4p) [phone] (8p- 10p) Email: [email or web address] Website: [email or web address] Service(s) Offered: A program for homeless and/or needy men that includes one-on-one counseling, life skills training and job rehabilitation.  Agency Name: [facility] [employer] [facility] [place] Address: [address] [place], [place] [address] Phone: [phone] Website: [email or web address] Service(s) Offered: Assistance to needy in emergency with utility bills, heating fuel, and prescriptions. Shelter for homeless 7pm-7am. October 30, 2016 15  Agency Name: [facility] [place] (Developmentally Disabled) Address: [address], [place], [place] [address] Phone: [phone] Contact Person: Lemond Cart Email: [email or web address] Website: [email or web address] Service(s) Offered: Helps individuals with developmental  disabilities move from housing that is more restrictive to homes where they  can achieve greater independence and have more  opportunities.  Agency Name: [facility] Address: [address], [place], [place] [address] Phone: [phone] Email: [email or web address] Website: [email or web address] Service(s) Offered: Provides affordable housing for low-income families, elderly, and disabled individuals. Offer a  wide range of  programs and services, from financial planning to afterschool and summer programs.  Agency Name: Department of Social Services Address: [address] [place], [place] [address] Phone: [phone] Service(s) Offered: Child support services; child welfare services; food stamps; Medicaid; work first family assistance; and aid with fuel,  rent, food and medicine.  Agency Name: [facility] of [place], [employer]. Address: [facility] [address], [place], [place]  [address] Phone: [phone] Website: [email or web address] Service(s) Offered: 24 hour Crisis Line: [phone]; 24 hour Emergency Shelter; Transitional Housing; Support Groups; Scientist, Physiological; [employer]; Hispanic Outreach: [phone];  Visitation Center: [phone].  Agency Name: [facility] [place]. Address: [address] [place], [place] [address] Phone: [phone] Service(s) Offered: CAP Services; Home and [employer]; Individual or Group Supports; Respite Care Non-Institutional Nursing;  Residential Supports; Respite Care and Personal Care Services; Transportation; Family and Friends Night; Recreational Activities; Three Nutritious Meals/Snacks; Consultation with Registered Dietician; Twenty-four hour Registered Nurse Access; Daily and [employer]; Camp Green Leaves; [place] for the [employer] (During Summer Months) Bingo Night (Every  Wednesday Night); Special Populations Dance Night  (Every  Tuesday Night); Professional Hair Care Services.  Agency Name: [facility] Address: [address], [place], [place] [address] Phone: [phone] Contact Person: Me[place] High Website: [email or web address].[email or web address], Medical) Offered: Residential treatment facility for women; food and  clothing, educational & employment development and  transportation to work; counsellor of financial skills;  parenting and family reunification; emotional and spiritual  support; transitional housing for program graduates.  Agency Name: [facility] Address: [address] [place], [place] [address] Phone: [phone] Email: [email or web address] Website: [email or web address] Service(s) Offered: Public housing units for elderly, disabled, and low income people; housing choice vouchers for income eligible  applicants; shelter plus care vouchers; and Psychologist, Clinical.  Agency Name: [facility] [employer] Address: [address] [place], [place] [address] Phone: [phone] Email: [email or web address] Website: www.[email or web address].org Service(s) Offered: Build houses for families in need of decent housing. Each adult in the family must invest 200 hours of labor on  someone else's house, work with volunteers to build their own house, attend classes on budgeting, home maintenance, yard care, and attend homeowner association meetings.  Agency Name: [facility] Address: [address], [place], [place] [address] Phone: [phone] Website: www.[email or web address] Service(s) Offered: Intermediate care facilities for intellectually delayed, Supervised Living in group homes for adults with developmental disabilities, Supervised Living for people who have dual diagnoses (MRMI), Independent Living, Supported Living, respite and a variety of CAP services, pre-vocational services, day supports, and [employer].  Agency Name: [place]  Foreclosure Prevention Fund Phone: [phone] Website: www.NCForeclosurePrevention.gov Service(s) Offered: Zero-interest, deferred loans to homeowners struggling to pay their mortgage. Call for more information.    Shelters Resource List  [facility]PAREL [facility] PROVIDED BY: [facility] [address] [place], [place], [place] Offers a faith-based shelter for homeless men, usually with substance use disorders. Residents receive counseling, life skills training, and help finding a job.  HOMELESS SHELTER PROVIDED BY: ALLIED CHURCHES OF [facility] [address] [place], [place], [place] Offers a shelter for men, women, and families experiencing homelessness. Food, clothing and other items are available for residents. Also offers support and services to help residents become self-sufficient. Offers temporary emergency housing for 30 days. Additional shelter may be available when temperatures drop below freezing but is not guaranteed.  FAMILY ABUSE SERVICES OF [place] COUNTY PROVIDED BY: FAMILY ABUSE SERVICES  OF [place] [address], [place], [place] Offers services for victims of domestic violence. Offers a 24-hour crisis line and emergency shelter. Offers information and referrals to other community resources. Also offers court advocacy and support groups.  HOUSING CHOICE VOUCHER PROGRAM PROVIDED BY: [facility]OUSING AUTHORITY - [place] [address], [place], [place] Offers vouchers for approved Section 8 properties. Vouchers offer financial help with rent    FRUIT TREE MINISTRIES PROVIDED BY: [facility], [place], [place] Offers emergency shelter for victims of domestic violence. Also offers a 24-hour crisis hotline for victims of domestic violence, safety planning, information and referrals, case management, and support groups for victims of domestic violence.   ACT TOGETHER EMERGENCY SHELTER PROVIDED BY: [facility]TH [facility]OC[facility] [address], [place], [place] Offers a 21-day emergency shelter for youth experiencing a family crisis, abuse, or homelessness. Case management, supportive services, healthcare services, and more are available for residents. SHELTER PROVIDED BY: [facility] [address], [place], [place] Offers a homeless shelter for people and families. Meals, showers, community referrals, case management, and more are available for residents. HEARTH TRANSITIONAL LIVING PROGRAM PROVIDED BY: [facility] [address], [place], [place] Offers an 16-month homeless shelter for younger adults experiencing homelessness. Case management, independent living skills education, and more are available for residents. PARTNERSHIP VILLAGE PROVIDED BY: [facility] [address], [place], [place] Offers transitional housing for families and single people experiencing homelessness. Residents meet regularly with a case manager to work towards self-sufficiency TRANSITIONAL HOUSING PROVIDED BY: [facility] [address], [place], [place] Offers transitional housing for male veterans with disabilities. Residents receive meals, transportation, and clothing. Also offers support groups, nutrition classes, and peer support to residents.   WEAVER HOUSE PROVIDED BY: [place] [facility] [address], [place], [place] Offers shelter to adult men and women. Guests receive hot meals and case management. Also offers overnight shelter when temperatures drop during cold winter months  EMERGENCY FAMILY SHELTER PROVIDED BY: [facility] [address], [place],  Offers shelter and support services for families experiencing homelessness.    Food Resources  Agency Name: [facility] Agency Address: [address], [place], [place] [address] Phone: [phone] Website: [email or web address] Service(s) Offered: Housing services, self-sufficiency,  congregate meal program, weatherization program, event organiser program, emergency food assistance,  housing counseling, home ownership program, wheels - to work program.  Dole Food free for 60 and older at various locations from usaa, Monday-Friday:  [employer], [address] [place], [phone] -[facility], [address], [place] [phone]  -[facility], [address] [place] [phone]  -[address], [address] [place], [phone]  Agency Name: [facility] on Wheels Address: [phone], [place], [place] [address] Phone: [phone] Website: [email or web address] Service(s) Offered: Home delivered hot, frozen, and emergency  meals. Grocery assistance program which matches  volunteers one-on-one with seniors unable to grocery shop  for themselves. Must be 60 years and older; less than 20  hours of in-home aide service, limited or no driving ability;  live alone or with someone with a disability; live in  [place].  Agency Name: Ecologist [facility]) Address: [address], [place], [place] [address] Phone: [phone] Service(s) Offered: Food is served to shut-ins, homeless, elderly, and low income people in the community every Saturday (11:30 am-12:30 pm) and Sunday (12:30 pm-1:30pm). Volunteers also offer help and encouragement in seeking employment,  and spiritual guidance.  Agency Name: Department  of Social Services Address: [address] [place], [place] [address] Phone: [phone] Service(s) Offered: Child support services; child welfare services; food stamps; Medicaid; work first family assistance; and aid with fuel,  rent, food and medicine.  Agency Name: Dietitian Address: [address] [place], [place] Phone: [phone] Website: [email or web address] Services Offered: Monday 10:00am-12:00,  8:00pm-9:00pm, and Friday 10:00am-12:00.  Agency Name: [facility] [place] Address: [address] [place], [place] [address] Phone: [phone] Website: [email or web address] Service(s) Offered: Serves weekday meals, open from 11:30 am- 1:00 pm., and 6:30-7:30pm, Monday-Wednesday-Friday distributes food 3:30-6pm, Monday-Wednesday-Friday.  Agency Name: [facility] Address: [address], [place], [place] Phone: [phone] Website: [email or web address] Services Offered: Distributes food the 4th Saturday of the month, starting at 8:00 am  Agency Name: [facility] Address: [phone] [address] [place], [place] [address] Phone: [phone] Website: [email or web address] Service(s) Offered: Bread of life, weekly food pantry. Open Wednesdays from 10:00am-noon.  Agency Name: [facility] [employer] Address: [address] [place], [place], [place] Phone: [phone] Services Offered: Distributes food 9am-1pm, Monday-Thursday. Call for details.  Agency Name: [facility] Address: [address], [place], [place] [address] Phone: [phone] Website: [email or web address] Service(s) Offered: Games Developer. Call for assistance.  Agency Name: [facility] Address: [address], [place], [place] [address] Phone: [phone] Service Offered: Emergency Food Pantry. Call for appointment.  Agency Name: [facility] Address: [address] [place], [place] [address] Phone: [phone] Website: [email or web address] Services Offered: Games Developer. Call for details  Agency Name: [facility] Address: [address] [place], [place] Phone: [phone] Website: [email or web address] Service(s) Offered: Emergency Food Pantry. Call for details.  Agency Name: Holiday Representative Address: [address] [place], [place] [address] Phone: [phone] or  [phone] Website: [email or web address] Service(s) Offered: Distribute food 9am-11:30 am, Tuesday-Friday, and 1-3:30pm, Monday-Friday. Food pantry Monday-Friday 1pm-3pm, fresh items, Mon.-Wed.-Fri.  Agency Name: [facility]) Address: [address] [place], [place] [address] Phone: [phone] Website: [email or web address] Services Offered: Distribute food Tues and Sats from 9:00am-noon. Closed 1st Saturday of each month. Call for details  Agency Name: [employer] Address: Fayrene Ava [facility] [address] [place] [address] Phone: [phone]  Services Offered: Delivers meals every Thursday   [facility] Assistance Programs Crisis Assistance Programs -Partners Ending Homelessness Coordinated Entry Program. If you are experiencing homelessness in [place], [place] , your first point of contact should be Pensions Consultant. You can reach Coordinated Entry by calling [phone] or by emailing [email or web address].  Community access points: [employer] [phone] [address], [place]) every Tuesday from 9am-10am. [facility] ([address] [place], [place]) every Wednesday from 8am-9am.   -The [employer] [phone]) offers several services to local families, as funding allows. The Emergency Assistance Program (EAP), which they administer, provides household goods, free food, clothing, and financial aid to people in need in the [place]  area. The EAP program does have some qualification, and counselors will interview clients for financial assistance by written referral only. Referrals need to be made by the Department of Social Services or by other EAP approved human services agencies or charities in the area.  -Open Door Ministries of [employer], which can be reached at [phone], offers emergency assistance programs for those in need of help, such  as food, rent assistance, a soup kitchen, shelter, and clothing. They are based in [facility]  [place] but provide a number of services to those that qualify for assistance.   [facility] Department of Social Services may be able to offer temporary financial assistance and cash grants for paying rent and utilities, Help may be provided for local county residents who may be experiencing personal crisis when other resources, including government programs, are not available. Call [phone]  -High [employer] is a [employer] agency, The organization can offer emergency assistance for paying rent, caremark rx, utilities, food, household products and furniture. They offer extensive emergency and transitional housing for families, children and single women, and also run a Boy's and Dole Food. Thrift Shops, Secondary School Teacher, and other aid offered too. [address] [place], [place]  [address], [phone]  -[place] Low Income Energy Assistance Program -- This is offered for [facility] families. The federal government created [employer] Program provides a one-time cash grant payment to help eligible low-income families pay their electric and heating bills. [address], [place], [place]  [address], [phone]  -[place] Emergency Assistance -- A program offers emergency utility and rent funds for greater [place] area residents. The program can also provide counseling and referrals to charities and government programs. Also provides food and a free meal program that serves lunch Mondays - Saturdays and dinner seven days per week to individuals in the community. [address], [place], [place]  [address], [phone]  -[employer] - Offers affordable apartment and housing communities across      [place] and [place]. The low income and seniors can access public housing,  rental assistance to qualified applicants, and apply for the section 8 rent subsidy program. Other programs include Chiropractor and Engineer, Maintenance. [address], [place], [place]  [address], dial [phone].  -The [facility] provides transitional housing to veterans and the disabled. Clients will also access other services too, including assistance in applying for Disability, life skills classes, case management, and assistance in finding permanent housing. [address], [place], [place]  [address], call [phone]  -[facility] Transitional Housing through [employer] is for people who were just evicted or that are formerly homeless. The non-profit will also help then gain self-sufficiency, find a home or apartment to live in, and also provides information on rent assistance when needed. Phone [phone]  -The [facility] [facility] helps low income, elderly, or disabled residents in seven counties in the [place] ([place], [employer] [place], [place], [place], [place], [place], [place], and [place]) save energy and reduce their utility bills by improving energy efficiency. Phone [phone].  -[employer] is located in the [place] [facility] in the [employer], [address], [place], [place] [address]. Parking is in the rear of the building. Phone: [phone]   General Email: info@gsohc .org  GHC provides free housing counseling assistance in locating affordable rental housing or housing with support services for families and individuals in crisis and the chronically homeless. We provide potential resources for other housing needs like utilities. Our trained counselors also work with clients on budgeting and financial literacy in effort to empower them to take control of their financial situations. [employer] collaborates with homeless service providers and other stakeholders as part of the [employer] COC (Continuum of Care). The (COC) is a regional/local planning body that coordinates housing and services funding for homeless families and individuals. The role of [facility]  in the COC is through housing counseling to work with people we serve on diversion strategies for those that are at imminent risk of becoming homeless. We also work with the Coordinated Assessment/Entry Specialist who attempts to find temporary solutions and/or connects the people to Housing First, Rapid Re-housing or transitional housing programs. Our Homelessness Prevention Housing Counselors meet with clients on business days (Monday-Fridays, except scheduled holidays) from 8:30 am to 4:30 pm.  Legal assistance for evictions, foreclosure, and more -If you need free legal advice on civil issues, such as foreclosures, evictions, electronics engineer, government programs, domestic issues and more, Landscape Architect of [place]  [facility]) is a associate professor firm that provides free legal services and counsel to lower income people, seniors, disabled, and others, The goal is to ensure everyone has access to justice and fair representation. Call them at [phone].  [facility] for Housing and Community Studies can provide info about obtaining legal assistance with evictions. Phone [phone]. Data Processing Manager  The [employer], [employer]. offers job and dispensing optician. Resources are focused on helping students obtain the skills and experiences that are necessary to compete in today's challenging and tight job market. The non-profit faith-based community action agency offers internship trainings as well as classroom instruction. Classes are tailored to meet the needs of people in the [facility] region. [place], [place] [address], [phone] Foreclosure Prevention/Debt Services Family Services of the [employer] Credit Counseling Service inludes debt and foreclosure prevention programs for local families. This includes money management, financial advice, budget review and development of a written action plan with a pensions consultant to help solve specific individual financial problems. In addition, housing and mortgage counselors can also provide pre- and post-purchase homeownership counseling, default resolution counseling (to prevent foreclosure) and reverse mortgage counseling. A Debt Management Program allows people and families with a high level of credit card or medical debt to consolidate and repay consumer debt and loans to creditors and rebuild positive credit ratings and scores. Contact [phone] F1555895. Community Clinics in [place] -Health Department [facility]: [address] [place], [place], [address]. [phone].  -Health Department [place] [facility]: [address], [facility], [address]. [phone].  -[facility] offers medical care through a group of doctors, pharmacies and other healthcare related agencies that offer services for low income, uninsured adults in [place]. Also offers adult Dental care and assistance with applying for an [employer]. Call [phone].   [facility]. This center provides low-cost health care to those without health insurance. Services offered include an onsite pharmacy. Phone [phone]. [address], [address], [place].  -Medication Assistance Program serves as a link between pharmaceutical companies and patients to provide low cost or free prescription medications. This service is available for residents who meet certain income restrictions and have no insurance coverage. PLEASE CALL [phone] [place]) OR [phone] ([place])  -One Step Further: Materials Engineer, [employer] Support &  Nutrition Program, [place]. Call [phone]/ [phone].  Food Emergency Planning/management Officer -[facility]: [address], [place] [address]. Phone [phone]  -[facility]: [address] [place], [place] [address]. [phone].  -[place] Food Finder: https://findfood.bargaincontractor.si  FLEEING VIOLENCE  -Family Services of the [employer]- 24/7 Crisis line ([phone]) -[facility]: [phone]) 641-SAFE [phone])  Transportation  -[facility] for  an application. No fee for people over the age of 69. P: [phone]. Address: [address] [place], [place] [address]  -Senior Wheels Transportation-Age 41 and over. Limit of 1 ride per week for ambulatory participants. Limit 1 ride per month for non-ambulatory participants needing wheelchair transportation. Contact: (336) [phone] ([place]), [phone] [facility]).  -Access [place]: Available for people with disabilities who are unable to use fixed-route bus service. Fee applies. Contact: [phone] (For application requests)/([phone]) [phone] (Customer service)/(336) [phone] (I-ride reservation line)/(336) [phone] (Access g[place] reservation line)   Building Services Engineer.org  Homeless Day Center  -[facility])   Day Center: M-F 8a-3p [address]  [place], [place] [address] [phone] Services include: laundry, barbering, support groups, case management, phone & computer access, showers, AA/NA mtgs, mental health/substance abuse nurse, job skills class, disability information, VA assistance, spiritual classes, etc.        HOMELESS SHELTERS [employer] [facility] at [employer]- Call [phone] ext. [phone] or ext. 336. Located at [address] [place], [place] [address]  [facility]- Call [phone]. Located  at [address] [place] [address].  [facility] [employer]. Call [phone]. Office located at [address], [place] [address].  [facility] through [place] [phone].  [facility]- Call [phone]. Located at [address] [place], [place], [place] [address].  [facility]For Pregnant mothers. Call [phone]. Located at [address] [place], [address].  [facility]For men in [place]. Call [phone].  [facility] [employer] for [employer] [phone]. Office located at [address] [place], [address].  [employer] be agreeable to help with chores. Call [phone] ext. [phone].  Men's: [address] [place], [place] [address]. Women's: [facility]  [address] [place], [place] [address]   Crisis Services [facility] Mobile Crisis Management- [phone]  [facility] [address], [place], [place] [address]. Phone: [phone]   *[place] 2-1-1 is another useful way to locate resources in the community. Visit shedsizes.ch to find service information online. If you need additional assistance, 2-1-1 Referral Specialists are available 24 hours a day, every day by dialing 2-1-1 or [phone] from any phone. The call is free, confidential, and available in any language.   Minimally Invasive Partial Colectomy, Adult, Care After The following information offers guidance on how to care for yourself after your procedure. Your health care provider may also give you more specific instructions. If you have problems or questions, contact your health care provider. What can I expect after the surgery? After the procedure, it is common to have: Pain, bruising, and swelling. Bloating. Weakness and tiredness (fatigue). Changes to your bowel movements, especially having bowel movements more  often. Follow these instructions at home: Medicines Take over-the-counter and prescription medicines only as told by your health care provider. If you were prescribed an antibiotic medicine, take it as told by your health care provider. Do not stop using the antibiotic even if you start to feel better. Ask your health care provider if the medicine prescribed to you: Requires you to avoid driving or using machinery. Can cause constipation. You may need to take these actions to prevent or treat constipation: Drink enough fluids to keep your urine pale yellow. Take over-the-counter or prescription medicines. Limit foods that are high in fat and processed sugars, such as fried  or sweet foods. Eating and drinking Follow instructions from your health care provider about what you may eat and drink. Do not drink alcohol if your health care provider tells you not to drink. Eat a low-fiber diet for the first 4 weeks after surgery or as told by your health care provider.. Most people on a low-fiber eating plan should eat less than 10 grams (g) of fiber a day. Follow recommendations from your health care provider or dietitian about how much fiber you should have each day. Always check food labels to know the fiber content of packaged foods. In general, a low-fiber food will have fewer than 2 g of fiber per serving. In general, try to avoid whole grains, raw fruits and vegetables, dried fruit, tough cuts of meat, nuts, and seeds. Incision care  Follow instructions from your health care provider about how to take care of your incisions. Make sure you: Wash your hands with soap and water for at least 20 seconds before and after you change your bandage (dressing). If soap and water are not available, use hand sanitizer. Change your dressing as told by your health care provider. Leave stitches (sutures), skin glue, or adhesive strips in place. These skin closures may need to stay in place for 2 weeks or  longer. If adhesive strip edges start to loosen and curl up, you may trim the loose edges. Do not remove adhesive strips completely unless your health care provider tells you to do that. Check your incision area every day for signs of infection. Check for: More redness, swelling, or pain. Fluid or blood. Warmth. Pus or a bad smell. Activity Rest as told by your health care provider. Avoid sitting for a long time without moving. Get up to take short walks every 1-2 hours. This is important to improve blood flow and breathing. Ask for help if you feel weak or unsteady. You may have to avoid lifting. Ask your health care provider how much you can safely lift. Return to your normal activities as told by your health care provider. Ask your health care provider what activities are safe for you. General instructions Do not use any products that contain nicotine  or tobacco. These products include cigarettes, chewing tobacco, and vaping devices, such as e-cigarettes. If you need help quitting, ask your health care provider. Do not take baths, swim, or use a hot tub until your health care provider approves. Ask your health care provider if you may take showers. You may only be allowed to take sponge baths. Wear compression stockings as told by your health care provider. These stockings help to prevent blood clots and reduce swelling in your legs. Keep all follow-up visits. This is important to monitor healing and check for any complications. Contact a health care provider if: Medicine is not controlling your pain. You have chills or fever. You have any signs of infection in your incision areas. You have a persistent cough. You have nausea or vomiting. You develop a rash. You have not had a bowel movement in 3 days. Get help right away if: You have severe pain. Your incisions break open after sutures or staples have been removed. You are bleeding from your rectum or have blood in your stool. You  have a warm, tender swelling in your leg. You have chest pain or trouble breathing. You have increased swelling in the abdomen. You feel light-headed or you faint. These symptoms may be an emergency. Get help right away. Call 911. Do not wait to  see if the symptoms will go away. Do not drive yourself to the hospital. Summary After surgery, it is common to have some pain, bruising, swelling, bloating, tiredness, weakness, or changes to your bowel movements. Follow instructions from your health care provider about what to eat and drink. Return to your normal activities as told by your health care provider. Check your incision area every day for signs of infection. Get help right away if you have chest pain or trouble breathing. This information is not intended to replace advice given to you by your health care provider. Make sure you discuss any questions you have with your health care provider. Document Revised: 10/09/2021 Document Reviewed: 10/09/2021 Elsevier Patient Education  2024 [employer].

## 2024-05-04 NOTE — Progress Notes (Signed)
  Progress Note   Patient: Christian Dickerson FMW:[id number] DOB: [birth year] DOA: 05/03/2024     1 DOS: the patient was seen and examined on 05/04/2024   Brief hospital course: JORDYNN PERRIER is a 38 y.o. male with medical history significant of alcohol abuse, hypertension and recent hospitalization last month for perforated diverticulitis requiring antibiotics and drainage tube.  Patient came in and was admitted to the psychiatric unit on 1026 for alcohol abuse and suicide attempt.  Medical team was consulted for rectal bleeding.  Patient does have some left lower quadrant abdominal pain.  CT scan of the abdomen pelvis showed sigmoid colon diverticulitis with adjacent 1.9 x 1.9 x 2 point centimeter air-fluid collection concerning for abscess/contained perforation.  Patient was seen by general surgery, planning for colectomy on 11/6.  Patient was placed on antibiotics with meropenem  based on susceptibility.   Principal Problem:   Colonic diverticular abscess Active Problems:   Rectal bleeding   Alcoholism (HCC)   Suicidal ideation   Essential hypertension   Obesity (BMI 30-39.9)   Tobacco abuse   Assessment and Plan: * Colonic diverticular abscess Rectal bleeding. Likely this is persistent diverticular abscess that never improved.  Patient given a dose of Cipro  and Flagyl  this morning while on the psychiatric floor.  Patient currently treated with meropenem , may consider change to oral Cipro  when condition improves. Patient has been seen by general surgery, scheduled for colectomy on 11/6.  Will keep patient in the hospital before then. Rectal bleed appears to be secondary to diverticulosis with diverticulitis. Hemoglobin has been stable, continue to follow.  Alcoholism (HCC) Advised to quit, continue thiamine  and folic acid .  Tobacco abuse Nicotine  patch  Obesity (BMI 30-39.9) BMI 31.86  Essential hypertension Continue Norvasc   Suicidal ideation With mood disorder.  Patient  still under IVC on Abilify and Prozac       Subjective:  Doing well today, no significant abdominal pain.  Has some loose stools.  Physical Exam: Vitals:   05/03/24 1523 05/03/24 1958 05/04/24 0407 05/04/24 0748  BP: 127/89 131/74 113/73 (!) 127/99  Pulse: 78 83 64 72  Resp: 16 20 16 16   Temp: 98.2 F (36.8 C) 98.2 F (36.8 C)    TempSrc:  Oral    SpO2: 98% 97% 96% 100%   General exam: Appears calm and comfortable  Respiratory system: Clear to auscultation. Respiratory effort normal. Cardiovascular system: S1 & S2 heard, RRR. No JVD, murmurs, rubs, gallops or clicks. No pedal edema. Gastrointestinal system: Abdomen is nondistended, soft and nontender. No organomegaly or masses felt. Normal bowel sounds heard. Central nervous system: Alert and oriented. No focal neurological deficits. Extremities: Symmetric 5 x 5 power. Skin: No rashes, lesions or ulcers Psychiatry: Judgement and insight appear normal. Mood & affect appropriate.    Data Reviewed:  Lab results reviewed.  Family Communication: None  Disposition: Status is: Inpatient Remains inpatient appropriate because: Severity of disease, IV treatment     Time spent: 35 minutes  Author: Murvin Mana, MD 05/04/2024 2:44 PM  For on call review [email or web address].

## 2024-05-04 NOTE — Consult Note (Signed)
 [facility] Psychiatric Consult Initial  Patient Name: .Christian Dickerson  MRN: [id number]  DOB: 09/02/1985  Consult Order details:  Orders (From admission, onward)     Start     Ordered   05/03/24 1003  IP CONSULT TO PSYCHIATRY       Comments: DR Josette savoy  Ordering Provider: Josette Ade, MD  Provider:  (Not yet assigned)  Question Answer Comment  Location [facility]   Reason for Consult? suicidal ideation      05/03/24 1002             Mode of Visit: In person    Psychiatry Consult Evaluation  Service Date: May 04, 2024 LOS:  LOS: 1 day  Chief Complaint I may have to have surgery  Primary Psychiatric Diagnoses  Suicidal ideation alcoholism   Assessment  Christian Dickerson is a 38 y.o. male admitted: Medically  Patient was transferred to the medical floor from inpatient psychiatric unit at [facility] due to findings on CT scan.  Patient was rounded on today by this provider.  Patient currently denied suicidal homicidal ideation.  He denied any auditory or visual hallucinations.  His main concern was potentially about having surgery and care plan once transferred to the medical floor.  Patient reported that treatment center of [place] told him that he would be accepted after he got his medical issues addressed.  At this time, patient is currently being medically admitted for further workup of symptoms.  Psychiatry will continue to round on patient while on the medical floor.  05/04/2024: Patient was seen on rounds today by psychiatry.  Patient reported currently doing okay.  He did ask if gabapentin normally makes people sleepy, we discussed side effects of gabapentin.  Patient does not wish to adjust current medication dose but we discussed that if sleepiness continues we can always further titrate gabapentin down.  Patient verbalized understanding at this time.  Patient denied suicidal or homicidal thoughts.  He denied auditory or visual  hallucinations as well.  He reported that medical team plans to do surgery next Thursday and that he would be staying in the hospital until then.  He did report poor sleep last night and reported that he only received 50 mg of trazodone.  He asked for medication to be adjusted back to his normal 100 mg dose nightly.  Psychiatry made these medication changes at this time.  Patient was alert and oriented x 4 and responding to questions appropriately.  Patient was currently displaying logical coherent thought process.  Patient did not appear to be responding to internal stimuli and did not appear to be manic at this time.  Psychiatry will continue to round on patient while on the medical floor.  Diagnoses:  Active Hospital problems: Principal Problem:   Colonic diverticular abscess Active Problems:   Alcoholism (HCC)   Suicidal ideation   Rectal bleeding   Essential hypertension   Obesity (BMI 30-39.9)   Tobacco abuse    Plan   ## Psychiatric Medication Recommendations:  Continue current medications from inpatient unit -Increased trazodone to 100 mg nightly PRN  ## Medical Decision Making Capacity: Not specifically addressed in this encounter     ## Disposition:--Psychiatry will continue to round on patient while on the medical floor  ## Behavioral / Environmental: - No specific recommendations at this time.     ## Safety and Observation Level:  - Based on my clinical evaluation, I estimate the patient to be at moderate risk  of self harm in the current setting. - At this time, we recommend  1:1 Observation. This decision is based on my review of the chart including patient's history and current presentation, interview of the patient, mental status examination, and consideration of suicide risk including evaluating suicidal ideation, plan, intent, suicidal or self-harm behaviors, risk factors, and protective factors. This judgment is based on our ability to directly address suicide risk,  implement suicide prevention strategies, and develop a safety plan while the patient is in the clinical setting. Please contact our team if there is a concern that risk level has changed.  CSSR Risk Category:C-SSRS RISK CATEGORY: Moderate Risk  Suicide Risk Assessment: Patient has following modifiable risk factors for suicide: recklessness, which we are addressing by psychiatry continuing to round on patient while being medically admitted. Patient has following non-modifiable or demographic risk factors for suicide: male gender and psychiatric hospitalization Patient has the following protective factors against suicide: Access to outpatient mental health care  Thank you for this consult request. Recommendations have been communicated to the primary team.  [name]   Christian Sharps, NP        History of Present Illness  Relevant Aspects of [facility]   Patient Report: Obtained from initial H&P on inpatient psych unit Patient is a 38 year old male with a past psychiatric history of alcohol use disorder who presented to the [facility] ED complaining of suicide attempt by taking Twenty Nine 5 mg BP pills with two 40 oz beers. Throughout evaluation in ED, patient's report was noted to be inconsistent with stating he overdosed then denying he overdosed.    Patient has a long standing history of alcohol abuse starting about 8-9 years ago. He reports it increased after his wife left with his kids. He has a history of a DUI, court case still pending. He also reports being in jail previously for 10 years.    Patient reports he came in for his ankle hurting and then the ED found out about him taking twenty nine blood pressure pills in attempt to end life. Patient reported it was a suicide attempt. He also then identified he was worried about his foot swelling and hurting so he wanted to get treatment.    Patient reports he did not stay at the facility he was sent  to because he did not want to work for no money. He reports he wants to go somewhere he can rebuilt my life. Patient identifies being homeless.    Upon presentation to the ED the patient was medically cleared for reported fall and swelling to right ankle. Right ankle continues to be swollen with patient reporting pain. Otho consulted and directed to Podiatry. Podiatry  (Dr. Alyson) reviewed images and recommended CT scan and a walking boot (if possible on unit).    Patient currently denies SI, HI, and AVH.    From IRIS consultation:  Patient refused to do an interview with me.  He stated that he lied about having overdosed because I'm homeless, and I knew that was what I needed to say.  However, he then said that he didn't want to stay now and demanded to leave.   I reminded patient that he had told several individuals that he had overdosed, including to Dr. Waymond.  According to Ms. Sloane, he said the following:   He reported that "I tried to kill myself by taking too much blood pressure medicine". He shared  that he took 29 lodatrines pills. He stated "My life is shit and I feel this way everyday". Mr. Winterhalter reports symptoms of depression "I just don't give a fuck, I just don't care". He shared that he does not know his feelings. He denied symptoms of anxiety. He denied having auditory or visual hallucinations. When asked homicidal ideation or intent, he stated "I'm not sure". He was unsure of whether he wanted to harm himself further.    Furthermore, the patient had just been discharged from Psychiatry on October 11, after he had been admitted for suicidal and homicidal ideation.  There was some indication that alcohol intoxication had played some role in his initial presentation, but patient also told staff that he'd had a diagnosis of bipolar disorder when he was younger, although he had never been treated.  Patient's mood instability was such that his provider, Ms. Katrinka RIGGERS, felt that  he met criteria for a trial of Abilify (a mood stabilizer), Prozac (for depression and anxiety) and Neurontin (for anxiety).  Patient appeared to have no problems with the meds, but at discharge he requested to be taken off the medications, as he was planning to go to a faith-based sober living facility, where, he reported, he could not take any psychotropic medication (As of this writing, it is unclear whether he attended that program or how he left the program, if he did attend)      Psychiatric and Social History  Past Psychiatric History: Alcohol use disorder Psychiatric History:  Information collected from chart and patient   Prev Dx/Sx: Alcohol use Disorder Current Psych Provider: None Home Meds (current):  Previous Med Trials: Ritalin, Wellbutrin, lithium, Seroquel  Therapy: None   Prior Psych Hospitalization: [facility] 04/09/24 - 04/19/24 Prior Self Harm: Denies Prior Violence: Endorses   Family Psych History: Schizophrenia (paternal aunt) Family Hx suicide: Denies   Social History:  Educational Hx: GED 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, previous jail time for 10 years Living Situation: Homeless Spiritual Hx: Religious- suicide against beliefs Access to weapons/lethal means: Denies   Substance History Alcohol: Daily use for several years Type of alcohol liquor Last Drink 04/29/24 Number of drinks per day several History of alcohol withdrawal seizures: Yes History of DT's: Denies Tobacco: Cigarettes Illicit drugs: Marijuana Prescription drug abuse: Endorses previous abuse of prescription drugs (Ritalin, Wellbutrin, Zoloft, others) but not recent  Rehab hx: Denies  Exam Findings  Physical Exam: Deferred to medical team note reviewed Vital Signs:  Temp:  [98.2 F (36.8 C)] 98.2 F (36.8 C) (10/28 1958) Pulse Rate:  [64-83] 72 (10/29 0748) Resp:  [16-20] 16 (10/29 0748) BP: (113-131)/(73-99) 127/99 (10/29 0748) SpO2:  [96 %-100 %] 100 %  (10/29 0748) Blood pressure (!) 127/99, pulse 72, temperature 98.2 F (36.8 C), temperature source Oral, resp. rate 16, SpO2 100%. There is no height or weight on file to calculate BMI.    Mental Status Exam: General Appearance: Casual  Orientation:  Full (Time, Place, and Person)  Memory:  Immediate;   Fair Recent;   Fair Remote;   Fair  Concentration:  Concentration: Fair and Attention Span: Fair  Recall:  Fair  Attention  Fair  Eye Contact:  Fair  Speech:  Clear and Coherent  Language:  Fair  Volume:  Normal  Mood: Euthymic  Affect:  Congruent  Thought Process:  Coherent  Thought Content:  Logical  Suicidal Thoughts:  No  Homicidal Thoughts:  No  Judgement:  Impaired  Insight:  Lacking and Shallow  Psychomotor Activity:  Normal  Akathisia:  No  Fund of Knowledge:  Fair      Assets:  Communication Skills Desire for Improvement  Cognition:  WNL  ADL's:  Intact  AIMS (if indicated):        Other History   These have been pulled in through the EMR, reviewed, and updated if appropriate.  Family History:  The patient's family history includes Heart attack in his father and mother.  Medical History: Past Medical History:  Diagnosis Date   Alcohol abuse    Diverticulitis    Hypertension    Substance abuse (HCC)     Surgical History: Past Surgical History:  Procedure Laterality Date   ANKLE SURGERY     CHEST TUBE INSERTION     LITHOTRIPSY     TIBIA FRACTURE SURGERY     TONSILLECTOMY AND ADENOIDECTOMY       Medications:   Current Facility-Administered Medications:    0.9 %  sodium chloride  infusion, , Intravenous, Continuous, Wieting, Richard, MD, Last Rate: 50 mL/hr at 05/04/24 0641, Infusion Verify at 05/04/24 0641   acetaminophen  (TYLENOL ) tablet 650 mg, 650 mg, Oral, Q6H PRN **OR** acetaminophen  (TYLENOL ) suppository 650 mg, 650 mg, Rectal, Q6H PRN, Josette, Richard, MD   amLODipine  (NORVASC ) tablet 5 mg, 5 mg, Oral, Daily, Hunter, Crystal L, PA-C, 5  mg at 05/04/24 9088   ARIPiprazole (ABILIFY) tablet 5 mg, 5 mg, Oral, QHS, Wieting, Richard, MD, 5 mg at 05/03/24 2119   chlordiazePOXIDE  (LIBRIUM ) capsule 25 mg, 25 mg, Oral, TID, Josette, Richard, MD, 25 mg at 05/04/24 0911   FLUoxetine (PROZAC) capsule 10 mg, 10 mg, Oral, Daily, Hunter, Crystal L, PA-C, 10 mg at 05/04/24 0920   folic acid  (FOLVITE ) tablet 1 mg, 1 mg, Oral, Daily, Hunter, Crystal L, PA-C, 1 mg at 05/04/24 0910   gabapentin (NEURONTIN) capsule 300 mg, 300 mg, Oral, TID, Hunter, Crystal L, PA-C, 300 mg at 05/04/24 0920   meropenem  (MERREM ) 1 g in sodium chloride  0.9 % 100 mL IVPB, 1 g, Intravenous, Q8H, Elesa Perkins, RPH, Last Rate: 200 mL/hr at 05/04/24 0921, 1 g at 05/04/24 9078   multivitamin with minerals tablet 1 tablet, 1 tablet, Oral, Daily, Hunter, Crystal L, PA-C, 1 tablet at 05/04/24 0910   nicotine  (NICODERM CQ  - dosed in mg/24 hours) patch 14 mg, 14 mg, Transdermal, Daily, Hunter, Crystal L, PA-C, 14 mg at 05/04/24 9089   ondansetron  (ZOFRAN ) tablet 4 mg, 4 mg, Oral, Q6H PRN **OR** ondansetron  (ZOFRAN ) injection 4 mg, 4 mg, Intravenous, Q6H PRN, Josette, Richard, MD   oxyCODONE  (Oxy IR/ROXICODONE ) immediate release tablet 5 mg, 5 mg, Oral, Q4H PRN, Josette, Richard, MD, 5 mg at 05/04/24 0920   pantoprazole  (PROTONIX ) EC tablet 40 mg, 40 mg, Oral, Daily, Hunter, Crystal L, PA-C, 40 mg at 05/04/24 0910   thiamine  (VITAMIN B1) tablet 100 mg, 100 mg, Oral, Daily, Hunter, Crystal L, PA-C, 100 mg at 05/04/24 0912   traZODone (DESYREL) tablet 50 mg, 50 mg, Oral, QHS PRN, Cleatus Delayne GAILS, MD, 50 mg at 05/03/24 2118  Allergies: Allergies  Allergen Reactions   Latex     Christian Sharps, NP

## 2024-05-04 NOTE — Plan of Care (Signed)

## 2024-05-04 NOTE — Progress Notes (Signed)
 [facility] SURGICAL PROGRESS NOTE (cpt [phone])  Hospital Day(s): 1.   Interval History: Patient seen and examined, no acute events or new complaints overnight. Patient reports he is doing fine. Very minimal, intermittent, abdominal soreness in LLQ. No fever, chills, nausea, emesis. WBC remains normal; 5.6K. He was advanced to regular diet without issue. Continues on Meropenem .   Review of Systems:  Constitutional: denies fever, chills  HEENT: denies cough or congestion  Respiratory: denies any shortness of breath  Cardiovascular: denies chest pain or palpitations  Gastrointestinal: denies abdominal pain, N/V Genitourinary: denies burning with urination or urinary frequency Musculoskeletal: denies pain, decreased motor or sensation  Vital signs in last 24 hours: [min-max] current  Temp:  [97.9 F (36.6 C)-98.2 F (36.8 C)] 98.2 F (36.8 C) (10/28 1958) Pulse Rate:  [64-93] 72 (10/29 0748) Resp:  [16-20] 16 (10/29 0748) BP: (113-156)/(73-99) 127/99 (10/29 0748) SpO2:  [96 %-100 %] 100 % (10/29 0748)             Intake/Output last 2 shifts:  10/28 0701 - 10/29 0700 In: 2358.1 [P.O.:720; I.V.:835.8; IV Piggyback:802.3] Out: -    Physical Exam:  Constitutional: alert, cooperative and no distress; NAD, safety sitter at bedside  HENT: normocephalic without obvious abnormality  Eyes: PERRL, EOM's grossly intact and symmetric  Respiratory: breathing non-labored at rest  Cardiovascular: regular rate and sinus rhythm  Gastrointestinal: soft, non-tender, and non-distended, no rebound/guarding    Labs:     Latest Ref Rng & Units 05/04/2024    5:15 AM 05/03/2024    6:27 AM 05/01/2024    8:48 PM  CBC  WBC 4.0 - 10.5 K/uL 5.6  6.1  7.8   Hemoglobin 13.0 - 17.0 g/dL 86.1  85.6  86.1   Hematocrit 39.0 - 52.0 % 40.8  41.8  40.2   Platelets 150 - 400 K/uL 150  163  180       Latest Ref Rng & Units 05/04/2024    5:15 AM 05/01/2024   10:22 AM 04/29/2024    9:35 PM   CMP  Glucose 70 - 99 mg/dL 92  897  92   BUN 6 - 20 mg/dL 9  9  10    Creatinine 0.61 - 1.24 mg/dL 9.31  9.41  9.46   Sodium 135 - 145 mmol/L 140  139  137   Potassium 3.5 - 5.1 mmol/L 4.2  4.3  3.0   Chloride 98 - 111 mmol/L 103  103  100   CO2 22 - 32 mmol/L 27  24  20    Calcium 8.9 - 10.3 mg/dL 8.8  9.3  8.6   Total Protein 6.5 - 8.1 g/dL 7.0  7.8  7.6   Total Bilirubin 0.0 - 1.2 mg/dL 0.5  0.9  0.7   Alkaline Phos 38 - 126 U/L 71  103  103   AST 15 - 41 U/L 30  39  31   ALT 0 - 44 U/L 33  35  32      Imaging studies: No new pertinent imaging studies   Assessment/Plan: 38 y.o. male with recurrent vs persistent diverticular abscess vs foci of extraluminal air, small and very stable, complicated by social barriers and psychiatric history.   - Again, we will tentatively plan for sigmoid colectomy next week (11/06). Hopefully, this will allow us  time for bowel prep and to limit risk of ostomy need, certainly given his psychosocial barriers. He understands this will remain a risk with surgery no matter  what. Right now, he is agreeable with this plan  - Okay to continue diet as tolerated              - Agree with IV Abx; Meropenem  is reasonable given previous Cx results - I do not think this foci of air is significantly worse compared to previous examinations. This is too small and protected by colon to be amenable to percutaneous drainage nor aspiration.  - Monitor abdominal examination; on-going bowel function  - Pain control prn; antiemetics prn - Further management per primary service; we will follow   - Appreciate psychiatric continued evaluation; he is currently IVC   All of the above findings and recommendations were discussed with the patient, and the medical team, and all of patient's questions were answered to his expressed satisfaction.  -- Arthea Platt, PA-C [facility] 05/04/2024, 8:13 AM M-F: 7am - 4pm

## 2024-05-04 NOTE — Hospital Course (Signed)
 Christian Dickerson is a 38 y.o. male with medical history significant of alcohol abuse, hypertension and recent hospitalization last month for perforated diverticulitis requiring antibiotics and drainage tube.  Patient came in and was admitted to the psychiatric unit on 1026 for alcohol abuse and suicide attempt.  Medical team was consulted for rectal bleeding.  Patient does have some left lower quadrant abdominal pain.  CT scan of the abdomen pelvis showed sigmoid colon diverticulitis with adjacent 1.9 x 1.9 x 2 point centimeter air-fluid collection concerning for abscess/contained perforation.  Patient was seen by general surgery, planning for colectomy on 11/6.  Patient was placed on antibiotics with meropenem  based on susceptibility.

## 2024-05-04 NOTE — Progress Notes (Signed)
 Patient requesting hospitalization note to be sent to court for upcoming court dates of 11/4 and 11/14. Patient made aware a note for court could be provided, but that it wouldn't be provided until Monday. As the 4th was still 6 days away. Patient also made aware the note, at this point, would not cover the 14th. Patient became upset with this. Patient stated that won't work because they won't get it in time. Assured patient this was the process and that in the role of nurse navigator I went through this process frequently. Patient got argumentative and then stated with all do respect I'll just have Zack do it because he's done it in and past and I know how it works. Christyne Platt, PA-C made aware, along with bedside nurse.  Nurse navigator signing off.

## 2024-05-04 NOTE — Progress Notes (Signed)
 Patient is complaining of pain in left testicle, with some swelling. U/s with doppler did not show any torsion (Spoke with radiologist), concerned about orchitis. Continue meropenem , added doxycycline . Increased pain med dose.

## 2024-05-04 NOTE — TOC Initial Note (Signed)
 Transition of Care [facility]) - Initial/Assessment Note    Patient Details  Name: Christian Dickerson MRN: [id number] Date of Birth: 03/05/1986  Transition of Care [facility]) CM/SW Contact:    Daved JONETTA Hamilton, RN Phone Number: 05/04/2024, 5:08 PM  Clinical Narrative:                  Patient currently under IVC. Met with patient, sitter in room at computer. Introduced self and explained TOC assistance with discharge planning. Patient verbalized he needs as much help as he can get as he is currently homeless and has been sleeping in the gazebo at the [place] in the city. Patient verbalized his girlfriend brings him food at night after she gets off work and he just received an EBT card this month however, he recently spoke with DSS and they informed him they are unable to load any additional funds to the card due to the current government shutdown. Patient verbalized he has about $70 left on the card.  Patient verbalized for transportation, he has just been walking everywhere he needed to go. This CM visualizes patient wearing DME on right lower extremity at the time of this conversation.  Patient verbalized he is in the process of establishing a PCP and has an appointment with Dr. Lenon on July 19, 2024 at 1:30pm, patient verbalized this is the soonest appointment the office could offer him.  Patient verbalized the only maintenance medication he was taking was blood pressure medication but he took them all and that's what got him IVC'd.  Patient verbalized desire to remain on [facility] prescribed medications once he is discharged as he feels they help him be able to function and make good decisions.  When this CM inquired about discharge planning and assistance, as this patient is being followed by General Surgery and planning for colectomy on 05/12/24, the patient verbalized that he has spoken with his brother who lives in [place], [place] and he should be able to stay with him for a couple of months to recover. When  this CM inquired how patient would get to [facility], patient verbalized he should be able to take a cab from the hospital to the train station and then travel by train to [place]. Patient verbalized brother lives close to the train station and can pick him up there.  Patient also verbalized the possibility of him going to a half way house once he recovers from surgery. Patient verbalized he is in contact with them currently and will continue to be.   This CM advised patient will add resources to discharge paperwork and TOC will continue to follow through his hospital course. Patient verbalized understanding and appreciation for any assistance provided.   Food, Shelter, and Transportation resources added to [employer].    Expected Discharge Plan:  (d/c plan pending patient hospital course, planning for surgery next week.) Barriers to Discharge: Continued Medical Work up   Patient Goals and CMS Choice            Expected Discharge Plan and Services   Discharge Planning Services: CM Consult   Living arrangements for the past 2 months: Homeless (patient verbalized he has been sleeping in the gazebo at the [facility] in [place])                                      Prior Living Arrangements/Services Living arrangements for the past 2 months: Homeless (  patient verbalized he has been sleeping in the gazebo at the [place] in [place])   Patient language and need for interpreter reviewed:: Yes Do you feel safe going back to the place where you live?: No   patient verbalized he is homeless  Need for Family Participation in Patient Care: No (Comment) Care giver support system in place?: No (comment)   Criminal Activity/Legal Involvement Pertinent to Current Situation/Hospitalization: No - Comment as needed  Activities of Daily Living   ADL Screening (condition at time of admission) Independently performs ADLs?: Yes (appropriate for developmental age) Is the patient deaf or have  difficulty hearing?: No Does the patient have difficulty seeing, even when wearing glasses/contacts?: No Does the patient have difficulty concentrating, remembering, or making decisions?: No  Permission Sought/Granted                  Emotional Assessment Appearance:: Appears stated age Attitude/Demeanor/Rapport: Engaged Affect (typically observed): Appropriate Orientation: : Oriented to Self, Oriented to Place, Oriented to  Time, Oriented to Situation Alcohol / Substance Use: Alcohol Use Psych Involvement: Yes (comment)  Admission diagnosis:  Colonic diverticular abscess [K57.20] Patient Active Problem List   Diagnosis Date Noted   Tobacco abuse 05/03/2024   Rectal bleeding 05/02/2024   Essential hypertension 05/02/2024   Obesity (BMI 30-39.9) 05/02/2024   Acute right ankle pain 05/02/2024   Blood in stool 05/01/2024   Diverticulosis 05/01/2024   Mood disorder 04/30/2024   Chest pain 04/10/2024   Major depressive disorder, 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/10/2024   Substance induced mood disorder (HCC) 04/09/2024   Acute alcoholic intoxication in alcoholism without complication (HCC) 04/09/2024   Alcohol dependence with alcohol-induced mood disorder (HCC) 04/09/2024   Homicidal ideation 04/09/2024   Suicidal ideation 04/09/2024   Alcohol dependence with uncomplicated withdrawal (HCC) 04/09/2024   Colonic diverticular abscess 03/31/2024   Alcoholic hepatitis (HCC) 03/14/2024   Diverticulitis of colon with perforation 03/13/2024   Alcoholism (HCC) 03/13/2024   PCP:  Pcp, No Pharmacy:   [id number] [address], [place] - [address] AT [facility] OF [address] [place] [address] Phone: [phone] Fax: [phone]  [facility] [address] [place] [address] Phone: [phone] Fax: [phone]     Social Drivers of Health (SDOH) Social  History: SDOH Screenings   Food Insecurity: Food Insecurity Present (05/03/2024)  Housing: High Risk (05/03/2024)  Transportation Needs: Patient Declined (05/03/2024)  Recent Concern: Transportation Needs - Unmet Transportation Needs (04/09/2024)  Utilities: Patient Declined (05/03/2024)  Alcohol Screen: High Risk (04/30/2024)  Social Connections: Socially Isolated (03/31/2024)  Tobacco Use: High Risk (04/30/2024)   SDOH Interventions:     Readmission Risk Interventions     No data to display

## 2024-05-05 DIAGNOSIS — R45851 Suicidal ideations: Secondary | ICD-10-CM | POA: Diagnosis not present

## 2024-05-05 DIAGNOSIS — F1020 Alcohol dependence, uncomplicated: Secondary | ICD-10-CM | POA: Diagnosis not present

## 2024-05-05 DIAGNOSIS — K5720 Diverticulitis of large intestine with perforation and abscess without bleeding: Secondary | ICD-10-CM | POA: Diagnosis not present

## 2024-05-05 DIAGNOSIS — Z720 Tobacco use: Secondary | ICD-10-CM

## 2024-05-05 DIAGNOSIS — K625 Hemorrhage of anus and rectum: Secondary | ICD-10-CM | POA: Diagnosis not present

## 2024-05-05 DIAGNOSIS — I10 Essential (primary) hypertension: Secondary | ICD-10-CM | POA: Diagnosis not present

## 2024-05-05 MED ORDER — FLUOXETINE HCL 20 MG PO CAPS
20.0000 mg | ORAL_CAPSULE | Freq: Every day | ORAL | Status: DC
  Administered 2024-05-06 – 2024-05-16 (×10): 20 mg via ORAL
  Filled 2024-05-05 (×11): qty 1

## 2024-05-05 NOTE — Progress Notes (Signed)
  Progress Note   Patient: Christian Dickerson FMW:[id number] DOB: 12-28-1985 DOA: 05/03/2024     2 DOS: the patient was seen and examined on 05/05/2024   Brief hospital course: ANDRIS BROTHERS is a 38 y.o. male with medical history significant of alcohol abuse, hypertension and recent hospitalization last month for perforated diverticulitis requiring antibiotics and drainage tube.  Patient came in and was admitted to the psychiatric unit on 1026 for alcohol abuse and suicide attempt.  Medical team was consulted for rectal bleeding.  Patient does have some left lower quadrant abdominal pain.  CT scan of the abdomen pelvis showed sigmoid colon diverticulitis with adjacent 1.9 x 1.9 x 2 point centimeter air-fluid collection concerning for abscess/contained perforation.  Patient was seen by general surgery, planning for colectomy on 11/6.  Patient was placed on antibiotics with meropenem  based on susceptibility.   Principal Problem:   Colonic diverticular abscess Active Problems:   Rectal bleeding   Alcoholism (HCC)   Suicidal ideation   Essential hypertension   Obesity (BMI 30-39.9)   Tobacco abuse   Assessment and Plan: * Colonic diverticular abscess Rectal bleeding. Likely this is persistent diverticular abscess that never improved.  Patient given a dose of Cipro  and Flagyl  this morning while on the psychiatric floor.  Patient currently treated with meropenem , may consider change to oral Cipro  when condition improves. Patient has been seen by general surgery, scheduled for colectomy on 11/6.  Will keep patient in the hospital before then. Rectal bleed appears to be secondary to diverticulosis with diverticulitis. Hemoglobin has been stable Continue IV meropenem  for another day, also added doxycycline  for possible orchitis.   Left-sided testicle pain with swelling. Happened 10/29, ultrasound with Doppler did not see any possibility of a torsion. I added doxycycline  in addition to  meropenem .  Alcoholism (HCC) Advised to quit, continue thiamine  and folic acid .   Tobacco abuse Nicotine  patch   Obesity (BMI 30-39.9) BMI 31.86   Essential hypertension Continue Norvasc    Suicidal ideation With mood disorder.  Patient still under IVC on Abilify and Prozac        Subjective:  Scrotal pain is better today.  No fever or chills.  Physical Exam: Vitals:   05/04/24 0748 05/04/24 1451 05/04/24 2058 05/05/24 0843  BP: (!) 127/99 126/70 (!) 135/90 134/83  Pulse: 72 79 72 82  Resp: 16 16 20 18   Temp:  97.7 F (36.5 C) 98 F (36.7 C) 97.8 F (36.6 C)  TempSrc:   Oral Oral  SpO2: 100% 98% 97% 97%   General exam: Appears calm and comfortable  Respiratory system: Clear to auscultation. Respiratory effort normal. Cardiovascular system: S1 & S2 heard, RRR. No JVD, murmurs, rubs, gallops or clicks. No pedal edema. Gastrointestinal system: Abdomen is nondistended, soft and LLQ tender. No organomegaly or masses felt. Normal bowel sounds heard. Central nervous system: Alert and oriented. No focal neurological deficits. Extremities: Symmetric 5 x 5 power. Skin: No rashes, lesions or ulcers Psychiatry: Judgement and insight appear normal. Mood & affect appropriate.    Data Reviewed:  [employer] results and lab results reviewed.  Family Communication: None  Disposition: Status is: Inpatient Remains inpatient appropriate because: Severity of disease, IV treatment     Time spent: 35 minutes  Author: Murvin Mana, MD 05/05/2024 10:48 AM  For on call review [email or web address].

## 2024-05-05 NOTE — Consult Note (Signed)
 [facility] Psychiatric Consult Initial  Patient Name: .Christian Dickerson  MRN: [id number]  DOB: [birth year]  Consult Order details:  Orders (From admission, onward)     Start     Ordered   05/03/24 1003  IP CONSULT TO PSYCHIATRY       Comments: DR Josette savoy  Ordering Provider: Josette Ade, MD  Provider:  (Not yet assigned)  Question Answer Comment  Location [facility]   Reason for Consult? suicidal ideation      05/03/24 1002             Mode of Visit: In person    Psychiatry Consult Evaluation  Service Date: May 05, 2024 LOS:  LOS: 2 days  Chief Complaint I may have to have surgery  Primary Psychiatric Diagnoses  Suicidal ideation alcoholism   Assessment  [name] is a 38 y.o. male admitted: Medically  Patient was transferred to the medical floor from inpatient psychiatric unit at [facility] due to findings on CT scan.  Patient was rounded on today by this provider.  Patient currently denied suicidal homicidal ideation.  He denied any auditory or visual hallucinations.  His main concern was potentially about having surgery and care plan once transferred to the medical floor.  Patient reported that treatment center of [place] told him that he would be accepted after he got his medical issues addressed.  At this time, patient is currently being medically admitted for further workup of symptoms.  Psychiatry will continue to round on patient while on the medical floor. 05/05/2024: Patient denies SI/HI/plan and denies hallucinations.  Patient is maintaining safe behaviors on the unit and is willing to participate in medical care.  Patient is able to disc adjustment in his psychotropic medications and is aware that dosage optimization is being held in the context of his diverticulitis to minimize any side of due to psychotropics. 05/04/2024: Patient was seen on rounds today by psychiatry.  Patient reported currently doing okay.  He did ask  if gabapentin normally makes people sleepy, we discussed side effects of gabapentin.  Patient does not wish to adjust current medication dose but we discussed that if sleepiness continues we can always further titrate gabapentin down.  Patient verbalized understanding at this time.  Patient denied suicidal or homicidal thoughts.  He denied auditory or visual hallucinations as well.  He reported that medical team plans to do surgery next Thursday and that he would be staying in the hospital until then.  He did report poor sleep last night and reported that he only received 50 mg of trazodone.  He asked for medication to be adjusted back to his normal 100 mg dose nightly.  Psychiatry made these medication changes at this time.  Patient was alert and oriented x 4 and responding to questions appropriately.  Patient was currently displaying logical coherent thought process.  Patient did not appear to be responding to internal stimuli and did not appear to be manic at this time.  Psychiatry will continue to round on patient while on the medical floor.  Diagnoses:  Active Hospital problems: Principal Problem:   Colonic diverticular abscess Active Problems:   Alcoholism (HCC)   Suicidal ideation   Rectal bleeding   Essential hypertension   Obesity (BMI 30-39.9)   Tobacco abuse    Plan   ## Psychiatric Medication Recommendations:  Continue current medications from inpatient unit -Increased trazodone to 100 mg nightly PRN  ## Medical Decision Making Capacity: Not specifically addressed  in this encounter     ## Disposition:--Psychiatry will continue to round on patient while on the medical floor  ## Behavioral / Environmental: - No specific recommendations at this time.     ## Safety and Observation Level:  - Based on my clinical evaluation, I estimate the patient to be at moderate risk of self harm in the current setting. - At this time, we recommend  1:1 Observation. This decision is based on  my review of the chart including patient's history and current presentation, interview of the patient, mental status examination, and consideration of suicide risk including evaluating suicidal ideation, plan, intent, suicidal or self-harm behaviors, risk factors, and protective factors. This judgment is based on our ability to directly address suicide risk, implement suicide prevention strategies, and develop a safety plan while the patient is in the clinical setting. Please contact our team if there is a concern that risk level has changed.  CSSR Risk Category:C-SSRS RISK CATEGORY: Moderate Risk  Suicide Risk Assessment: Patient has following modifiable risk factors for suicide: recklessness, which we are addressing by psychiatry continuing to round on patient while being medically admitted. Patient has following non-modifiable or demographic risk factors for suicide: male gender and psychiatric hospitalization Patient has the following protective factors against suicide: Access to outpatient mental health care  Thank you for this consult request. Recommendations have been communicated to the primary team.  [name]   [name]        History of Present Illness  Relevant Aspects of Hospital Hospital   Patient Report: Obtained from initial H&P on inpatient psych unit Patient is a 38 year old male with a past psychiatric history of alcohol use disorder who presented to the [facility] ED complaining of suicide attempt by taking Twenty Nine 5 mg BP pills with two 40 oz beers. Throughout evaluation in ED, patient's report was noted to be inconsistent with stating he overdosed then denying he overdosed.  Patient was initially admitted to inpatient psychiatric unit for stabilization.  During the hospital stay he had blood in stool.  Hospitalist was consulted and GI consult was made.  CT abdomen showed diverticulitis and patient has been transferred to medical  floor for further medical care.  Today on interview patient reports doing well.  He denies SI/HI/plan and denies auditory/visual hallucinations.  He does talk about mood lability and mood swings and wanted the Abilify to be titrated up in the dosage.  Provider and patient also discussed about optimizing the dosage of Prozac.  Provider educated patient upon the current GI symptoms, diverticulitis and to wait on adjusting the dose of Abilify to prevent any anticholinergic side effects.     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, previous jail time for 10 years Living Situation: Homeless Spiritual Hx: Religious- suicide against beliefs Access to weapons/lethal means: Denies   Substance History Alcohol: Daily use for several years Type of alcohol liquor Last Drink 04/29/24 Number of drinks per day several  History of alcohol withdrawal seizures: Yes History of DT's: Denies Tobacco: Cigarettes Illicit drugs: Marijuana Prescription drug abuse: Endorses previous abuse of prescription drugs (Ritalin, Wellbutrin, Zoloft, others) but not recent  Rehab hx: Denies  Exam Findings  Physical Exam: Deferred to medical team note reviewed Vital Signs:  Temp:  [97.8 F (36.6 C)-98.1 F (36.7 C)] 98.1 F (36.7 C) (10/30 1928) Pulse Rate:  [77-84] 77 (10/30 1928) Resp:  [16-18] 16 (10/30 1928) BP: (122-141)/(78-85) 141/85 (10/30 1928) SpO2:   [97 %-99 %] 97 % (10/30 1928) Blood pressure (!) 141/85, pulse 77, temperature 98.1 F (36.7 C), resp. rate 16, SpO2 97%. There is no height or weight on file to calculate BMI.    Mental Status Exam: General Appearance: Casual  Orientation:  Full (Time, Place, and Person)  Memory:  Immediate;   Fair Recent;   Fair Remote;   Fair  Concentration:  Concentration: Fair and Attention Span: Fair  Recall:  Fair  Attention  Fair  Eye Contact:  Fair  Speech:  Clear and Coherent  Language:  Fair  Volume:  Normal  Mood: Euthymic  Affect:  Congruent  Thought Process:  Coherent  Thought Content:  Logical  Suicidal Thoughts:  No  Homicidal Thoughts:  No  Judgement:  chronically limited  Insight:  chronically limited  Psychomotor Activity:  Normal  Akathisia:  No  Fund of Knowledge:  Fair      Assets:  Manufacturing Systems Engineer Desire for Improvement  Cognition:  WNL  ADL's:  Intact  AIMS (if indicated):        Other History   These have been pulled in through the EMR, reviewed, and updated if appropriate.  Family History:  The patient's family history includes Heart attack in his father and mother.  Medical History: Past Medical History:  Diagnosis Date   Alcohol abuse    Diverticulitis    Hypertension    Substance abuse (HCC)     Surgical History: Past Surgical History:  Procedure Laterality Date   ANKLE SURGERY     CHEST TUBE INSERTION     LITHOTRIPSY     TIBIA FRACTURE SURGERY     TONSILLECTOMY AND ADENOIDECTOMY       Medications:   Current Facility-Administered Medications:    acetaminophen  (TYLENOL ) tablet 650 mg, 650 mg, Oral, Q6H PRN **OR** acetaminophen  (TYLENOL ) suppository 650 mg, 650 mg, Rectal, Q6H PRN, Josette, Richard, MD   amLODipine  (NORVASC ) tablet 5 mg, 5 mg, Oral, Daily, Hunter, Crystal L, PA-C, 5 mg at 05/05/24 0919   ARIPiprazole (ABILIFY) tablet 5 mg, 5 mg, Oral, QHS, Wieting, Richard, MD, 5 mg at 05/05/24 2049   chlordiazePOXIDE  (LIBRIUM )  capsule 25 mg, 25 mg, Oral, TID, Josette Ade, MD, 25 mg at 05/05/24 2049   doxycycline  (VIBRA -TABS) tablet 100 mg, 100 mg, Oral, Q12H, Zhang, Dekui, MD, 100 mg at 05/05/24 2048   [START ON 05/06/2024] FLUoxetine (PROZAC) capsule 20 mg, 20 mg, Oral, Daily, Woodie Trusty, MD   folic acid  (FOLVITE ) tablet 1 mg, 1 mg, Oral, Daily, Hunter, Crystal L, PA-C, 1 mg at 05/05/24 0919   gabapentin (NEURONTIN) capsule 300 mg, 300 mg, Oral, TID, Hunter, Crystal L, PA-C, 300 mg at 05/05/24 2049   meropenem  (MERREM ) 1 g in sodium chloride  0.9 % 100 mL IVPB, 1 g, Intravenous, Q8H, Elesa Perkins, RPH, Last Rate: 200 mL/hr at 05/05/24 1804, 1 g at 05/05/24 1804   multivitamin with minerals tablet 1 tablet, 1 tablet, Oral, Daily, Hunter, Crystal L, PA-C, 1 tablet  at 05/05/24 0919   nicotine  (NICODERM CQ  - dosed in mg/24 hours) patch 14 mg, 14 mg, Transdermal, Daily, Hunter, Crystal L, PA-C, 14 mg at 05/05/24 0920   ondansetron  (ZOFRAN ) tablet 4 mg, 4 mg, Oral, Q6H PRN **OR** ondansetron  (ZOFRAN ) injection 4 mg, 4 mg, Intravenous, Q6H PRN, Josette, Richard, MD   oxyCODONE  (Oxy IR/ROXICODONE ) immediate release tablet 5-10 mg, 5-10 mg, Oral, Q4H PRN, Zhang, Dekui, MD, 10 mg at 05/05/24 2048   pantoprazole  (PROTONIX ) EC tablet 40 mg, 40 mg, Oral, Daily, Hunter, Crystal L, PA-C, 40 mg at 05/05/24 0919   thiamine  (VITAMIN B1) tablet 100 mg, 100 mg, Oral, Daily, Hunter, Crystal L, PA-C, 100 mg at 05/05/24 0919   traZODone (DESYREL) tablet 100 mg, 100 mg, Oral, QHS PRN, Smith, Annie B, NP, 100 mg at 05/05/24 2049  Allergies: Allergies  Allergen Reactions   Latex     Journey Ratterman.MD

## 2024-05-05 NOTE — Progress Notes (Signed)
 [facility] SURGICAL PROGRESS NOTE (cpt [phone])  Hospital Day(s): 2.   Interval History: Patient seen and examined. Overnight with complaints of testicular swelling. He did get US  without evidence of torsion; question orchiditis. Patient reports he is doing okay this AM. No significant abdominal pain. No fever, chills, nausea, emesis. No new labs. He was advanced to regular diet without issue. Continues on Meropenem . Doxycycline  added for possible orchiditis.   Review of Systems:  Constitutional: denies fever, chills  HEENT: denies cough or congestion  Respiratory: denies any shortness of breath  Cardiovascular: denies chest pain or palpitations  Gastrointestinal: denies abdominal pain, N/V Genitourinary: denies burning with urination or urinary frequency Musculoskeletal: denies pain, decreased motor or sensation  Vital signs in last 24 hours: [min-max] current  Temp:  [97.7 F (36.5 C)-98 F (36.7 C)] 98 F (36.7 C) (10/29 7941) Pulse Rate:  [72-79] 72 (10/29 2058) Resp:  [16-20] 20 (10/29 7941) BP: (126-135)/(70-99) 135/90 (10/29 2058) SpO2:  [97 %-100 %] 97 % (10/29 2058)             Intake/Output last 2 shifts:  10/29 0701 - 10/30 0700 In: 900 [P.O.:900] Out: -    Physical Exam:  Constitutional: alert, cooperative and no distress; NAD, safety sitter at bedside  HENT: normocephalic without obvious abnormality  Eyes: PERRL, EOM's grossly intact and symmetric  Respiratory: breathing non-labored at rest  Cardiovascular: regular rate and sinus rhythm  Gastrointestinal: soft, non-tender, and non-distended, no rebound/guarding    Labs:     Latest Ref Rng & Units 05/04/2024    5:15 AM 05/03/2024    6:27 AM 05/01/2024    8:48 PM  CBC  WBC 4.0 - 10.5 K/uL 5.6  6.1  7.8   Hemoglobin 13.0 - 17.0 g/dL 86.1  85.6  86.1   Hematocrit 39.0 - 52.0 % 40.8  41.8  40.2   Platelets 150 - 400 K/uL 150  163  180       Latest Ref Rng & Units 05/04/2024    5:15 AM  05/01/2024   10:22 AM 04/29/2024    9:35 PM  CMP  Glucose 70 - 99 mg/dL 92  897  92   BUN 6 - 20 mg/dL 9  9  10    Creatinine 0.61 - 1.24 mg/dL 9.31  9.41  9.46   Sodium 135 - 145 mmol/L 140  139  137   Potassium 3.5 - 5.1 mmol/L 4.2  4.3  3.0   Chloride 98 - 111 mmol/L 103  103  100   CO2 22 - 32 mmol/L 27  24  20    Calcium 8.9 - 10.3 mg/dL 8.8  9.3  8.6   Total Protein 6.5 - 8.1 g/dL 7.0  7.8  7.6   Total Bilirubin 0.0 - 1.2 mg/dL 0.5  0.9  0.7   Alkaline Phos 38 - 126 U/L 71  103  103   AST 15 - 41 U/L 30  39  31   ALT 0 - 44 U/L 33  35  32      Imaging studies: No new pertinent imaging studies   Assessment/Plan: 38 y.o. male with recurrent vs persistent diverticular abscess vs foci of extraluminal air, small and very stable, complicated by social barriers and psychiatric history.   - Again, we will tentatively plan for sigmoid colectomy next week (11/06). Hopefully, this will allow us  time for bowel prep and to limit risk of ostomy need, certainly given his psychosocial barriers. He understands  this will remain a risk with surgery no matter what. Right now, he is agreeable with this plan  - Okay to continue diet as tolerated              - Agree with IV Abx; Meropenem  is reasonable given previous Cx results - I do not think this foci of air is significantly worse compared to previous examinations. This is too small and protected by colon to be amenable to percutaneous drainage nor aspiration.  - Monitor abdominal examination; on-going bowel function  - Pain control prn; antiemetics prn - Further management per primary service; we will follow   - Appreciate psychiatric continued evaluation; he is currently IVC   All of the above findings and recommendations were discussed with the patient, and the medical team, and all of patient's questions were answered to his expressed satisfaction.  -- Arthea Platt, PA-C [facility] 05/05/2024, 7:34 AM M-F: 7am -  4pm

## 2024-05-06 ENCOUNTER — Telehealth: Payer: Self-pay

## 2024-05-06 DIAGNOSIS — R45851 Suicidal ideations: Secondary | ICD-10-CM

## 2024-05-06 DIAGNOSIS — F39 Unspecified mood [affective] disorder: Secondary | ICD-10-CM

## 2024-05-06 DIAGNOSIS — F1020 Alcohol dependence, uncomplicated: Secondary | ICD-10-CM | POA: Diagnosis not present

## 2024-05-06 DIAGNOSIS — K5720 Diverticulitis of large intestine with perforation and abscess without bleeding: Secondary | ICD-10-CM | POA: Diagnosis not present

## 2024-05-06 MED ORDER — HYDROMORPHONE HCL 1 MG/ML IJ SOLN
1.0000 mg | INTRAMUSCULAR | Status: DC | PRN
  Administered 2024-05-06 – 2024-05-08 (×5): 1 mg via INTRAVENOUS
  Filled 2024-05-06 (×5): qty 1

## 2024-05-06 MED ORDER — MORPHINE SULFATE (PF) 2 MG/ML IV SOLN
2.0000 mg | INTRAVENOUS | Status: DC | PRN
  Filled 2024-05-06: qty 1

## 2024-05-06 NOTE — Progress Notes (Signed)
 Progress Note   Patient: Christian Dickerson FMW:[id number] DOB: [birth year] DOA: 05/03/2024     3 DOS: the patient was seen and examined on 05/06/2024   Brief hospital course: EURIAH MATLACK is a 38 y.o. male with medical history significant of alcohol abuse, hypertension and recent hospitalization last month for perforated diverticulitis requiring antibiotics and drainage tube.  Patient came in and was admitted to the psychiatric unit on 1026 for alcohol abuse and suicide attempt.  Medical team was consulted for rectal bleeding.  Patient does have some left lower quadrant abdominal pain.  CT scan of the abdomen pelvis showed sigmoid colon diverticulitis with adjacent 1.9 x 1.9 x 2 point centimeter air-fluid collection concerning for abscess/contained perforation.  Patient was seen by general surgery, planning for colectomy on 11/6.  Patient was placed on antibiotics with meropenem  based on susceptibility.   Principal Problem:   Colonic diverticular abscess Active Problems:   Rectal bleeding   Alcoholism (HCC)   Suicidal ideation   Essential hypertension   Obesity (BMI 30-39.9)   Tobacco abuse   Assessment and Plan:  Colonic diverticular abscess Rectal bleeding. Likely this is persistent diverticular abscess that never improved.  Patient given a dose of Cipro  and Flagyl  this morning while on the psychiatric floor.  Patient currently treated with meropenem , may consider change to oral Cipro  when condition improves. Patient has been seen by general surgery, scheduled for colectomy on 11/6.  Will keep patient in the hospital before then. Rectal bleed appears to be secondary to diverticulosis with diverticulitis. Hemoglobin has been stable Continued IV meropenem , also added doxycycline  for possible orchitis.  Condition appears to be improving, continue to monitor.   Left-sided testicle pain with swelling. Happened 10/29, ultrasound with Doppler did not see any possibility of a torsion. I  added doxycycline  in addition to meropenem . Patient improving   Alcoholism (HCC) Advised to quit, continue thiamine  and folic acid .  No evidence of alcohol withdrawal.   Tobacco abuse Nicotine  patch   Obesity (BMI 30-39.9) BMI 31.86   Essential hypertension Continue Norvasc    Suicidal ideation With mood disorder.  Patient still under IVC on Abilify and Prozac        Subjective:  Patient doing better, scrotal pain is better.  Abdominal pain has resolved.  Still has daily loose stool with 1 bowel movement each day  Physical Exam: Vitals:   05/05/24 1531 05/05/24 1928 05/06/24 0324 05/06/24 0731  BP: 129/80 (!) 141/85 135/85 115/76  Pulse: 80 77 80 79  Resp: 16 16 16 16   Temp: 98 F (36.7 C) 98.1 F (36.7 C) 98 F (36.7 C) 97.8 F (36.6 C)  TempSrc: Oral     SpO2: 99% 97% 99% 100%   General exam: Appears calm and comfortable  Respiratory system: Clear to auscultation. Respiratory effort normal. Cardiovascular system: S1 & S2 heard, RRR. No JVD, murmurs, rubs, gallops or clicks. No pedal edema. Gastrointestinal system: Abdomen is nondistended, soft and nontender. No organomegaly or masses felt. Normal bowel sounds heard. Central nervous system: Alert and oriented. No focal neurological deficits. Extremities: Symmetric 5 x 5 power. Skin: No rashes, lesions or ulcers Psychiatry: Judgement and insight appear normal. Mood & affect appropriate.    Data Reviewed:  There are no new results to review at this time.  Family Communication: None  Disposition: Status is: Inpatient Remains inpatient appropriate because: Severity of disease, IV treatment     Time spent: 35 minutes  Author: Murvin Mana, MD 05/06/2024 12:13 PM  For on call review [email or web address].

## 2024-05-07 ENCOUNTER — Encounter: Payer: Self-pay | Admitting: Internal Medicine

## 2024-05-07 DIAGNOSIS — K625 Hemorrhage of anus and rectum: Secondary | ICD-10-CM | POA: Diagnosis not present

## 2024-05-07 DIAGNOSIS — K630 Abscess of intestine: Secondary | ICD-10-CM

## 2024-05-07 DIAGNOSIS — R45851 Suicidal ideations: Secondary | ICD-10-CM | POA: Diagnosis not present

## 2024-05-07 DIAGNOSIS — F3132 Bipolar disorder, current episode depressed, moderate: Secondary | ICD-10-CM

## 2024-05-07 DIAGNOSIS — K5720 Diverticulitis of large intestine with perforation and abscess without bleeding: Secondary | ICD-10-CM | POA: Diagnosis not present

## 2024-05-07 DIAGNOSIS — F1020 Alcohol dependence, uncomplicated: Secondary | ICD-10-CM | POA: Diagnosis not present

## 2024-05-07 MED ORDER — MELATONIN 5 MG PO TABS
5.0000 mg | ORAL_TABLET | Freq: Every day | ORAL | Status: DC
  Administered 2024-05-07 – 2024-05-15 (×9): 5 mg via ORAL
  Filled 2024-05-07 (×9): qty 1

## 2024-05-07 MED ORDER — METRONIDAZOLE 500 MG PO TABS
500.0000 mg | ORAL_TABLET | Freq: Two times a day (BID) | ORAL | Status: DC
Start: 2024-05-08 — End: 2024-05-12
  Administered 2024-05-08 – 2024-05-11 (×8): 500 mg via ORAL
  Filled 2024-05-07 (×9): qty 1

## 2024-05-07 MED ORDER — KETOROLAC TROMETHAMINE 15 MG/ML IJ SOLN
15.0000 mg | Freq: Four times a day (QID) | INTRAMUSCULAR | Status: DC | PRN
  Administered 2024-05-07 – 2024-05-11 (×8): 15 mg via INTRAVENOUS
  Filled 2024-05-07 (×8): qty 1

## 2024-05-07 MED ORDER — CIPROFLOXACIN HCL 500 MG PO TABS
500.0000 mg | ORAL_TABLET | Freq: Two times a day (BID) | ORAL | Status: DC
  Administered 2024-05-08 – 2024-05-11 (×8): 500 mg via ORAL
  Filled 2024-05-07 (×8): qty 1

## 2024-05-07 NOTE — Plan of Care (Signed)

## 2024-05-07 NOTE — Consult Note (Signed)
 [facility] Consult Follow up  Patient Name: .Christian Dickerson  MRN: [id number]  DOB: August 09, 1985  Consult Order details:  Orders (From admission, onward)     Start     Ordered   05/03/24 1003  IP CONSULT TO PSYCHIATRY       Comments: DR Josette savoy  Ordering Provider: Josette Ade, MD  Provider:  (Not yet assigned)  Question Answer Comment  Location [facility]   Reason for Consult? suicidal ideation      05/03/24 1002             Mode of Visit: In person    Psychiatry Consult Evaluation  Service Date: May 07, 2024 LOS:  LOS: 4 days  Chief Complaint I may have to have surgery  Primary Psychiatric Diagnoses  Suicidal ideation alcoholism   Assessment  Christian Dickerson is a 38 y.o. male admitted: Medically  05/07/2024: Patient is assessed again on the inpatient unit at the request of the patient request to discuss his medications. Patient has a sitter at bedside and requested for consideration to become voluntary. Patient denied suicidal thoughts, has been compliant with medications, and his treatment plan. He will require surgery on the his right leg and states that he understands the necessity of the surgery and will be compliant with recommendations. Discussed conditions of voluntary status with patient and he was agreeable (he had in his belongings a taser, pepper spray, and fingernail clippers that he was agreeable to leave in the possession of security).  Update: During process of commitment status change, patient stated to this provider that he was not comfortable with the sitter being removed from his room. I don't trust my thoughts when I am alone. Discussed at length with patient that he had options to talk with staff in those moments and he continued to disagree that he did not trust his thoughts. He requested to leave IVC in place.  Update: RN contacted provider that patient became irrate when he was informed that his GF  would not be allowed to bring outside food to the unit and would have limited visitation time. Slamming his fists on the nursing station and be very difficult to redirect. PRN medication for agitation ordered.  05/05/2024:Patient was transferred to the medical floor from inpatient psychiatric unit at [facility] due to findings on CT scan.  Patient was rounded on today by this provider.  Patient currently denied suicidal homicidal ideation.  He denied any auditory or visual hallucinations.  His main concern was potentially about having surgery and care plan once transferred to the medical floor.  Patient reported that treatment center of [place] told him that he would be accepted after he got his medical issues addressed.  At this time, patient is currently being medically admitted for further workup of symptoms.  Psychiatry will continue to round on patient while on the medical floor. 05/05/2024: Patient denies SI/HI/plan and denies hallucinations.  Patient is maintaining safe behaviors on the unit and is willing to participate in medical care.  Patient is able to disc adjustment in his psychotropic medications and is aware that dosage optimization is being held in the context of his diverticulitis to minimize any side of due to psychotropics. 05/04/2024: Patient was seen on rounds today by psychiatry.  Patient reported currently doing okay.  He did ask if gabapentin normally makes people sleepy, we discussed side effects of gabapentin.  Patient does not wish to adjust current medication dose but we discussed that  if sleepiness continues we can always further titrate gabapentin down.  Patient verbalized understanding at this time.  Patient denied suicidal or homicidal thoughts.  He denied auditory or visual hallucinations as well.  He reported that medical team plans to do surgery next Thursday and that he would be staying in the hospital until then.  He did report poor sleep last night and reported that he only  received 50 mg of trazodone.  He asked for medication to be adjusted back to his normal 100 mg dose nightly.  Psychiatry made these medication changes at this time.  Patient was alert and oriented x 4 and responding to questions appropriately.  Patient was currently displaying logical coherent thought process.  Patient did not appear to be responding to internal stimuli and did not appear to be manic at this time.  Psychiatry will continue to round on patient while on the medical floor.  Diagnoses:  Active Hospital problems: Principal Problem:   Colonic diverticular abscess Active Problems:   Alcoholism (HCC)   Suicidal ideation   Rectal bleeding   Essential hypertension   Obesity (BMI 30-39.9)   Tobacco abuse    Plan   ## Psychiatric Medication Recommendations:  Continue current medications from inpatient unit -Increased trazodone to 100 mg nightly PRN  ## Medical Decision Making Capacity: Not specifically addressed in this encounter     ## Disposition:--Psychiatry will continue to round on patient while on the medical floor  ## Behavioral / Environmental: - No specific recommendations at this time.     ## Safety and Observation Level:  - Based on my clinical evaluation, I estimate the patient to be at moderate risk of self harm in the current setting. - At this time, we recommend  1:1 Observation. This decision is based on my review of the chart including patient's history and current presentation, interview of the patient, mental status examination, and consideration of suicide risk including evaluating suicidal ideation, plan, intent, suicidal or self-harm behaviors, risk factors, and protective factors. This judgment is based on our ability to directly address suicide risk, implement suicide prevention strategies, and develop a safety plan while the patient is in the clinical setting. Please contact our team if there is a concern that risk level has changed.  CSSR Risk  Category:C-SSRS RISK CATEGORY: Moderate Risk  Suicide Risk Assessment: Patient has following modifiable risk factors for suicide: recklessness, which we are addressing by psychiatry continuing to round on patient while being medically admitted. Patient has following non-modifiable or demographic risk factors for suicide: male gender and psychiatric hospitalization Patient has the following protective factors against suicide: Access to outpatient mental health care  Thank you for this consult request. Recommendations have been communicated to the primary team.  [name]   Sree Jadapalle.MD        History of Present Illness  Relevant Aspects of Hospital Hospital   Patient Report: Obtained from initial H&P on inpatient psych unit Patient is a 38 year old male with a past psychiatric history of alcohol use disorder who presented to the [facility] ED complaining of suicide attempt by taking Twenty Nine 5 mg BP pills with two 40 oz beers. Throughout evaluation in ED, patient's report was noted to be inconsistent with stating he overdosed then denying he overdosed.  Patient was initially admitted to inpatient psychiatric unit for stabilization.  During the hospital stay he had blood in stool.  Hospitalist was consulted and GI consult was  made.  CT abdomen showed diverticulitis and patient has been transferred to medical floor for further medical care.  Today on interview patient reports doing well.  He denies SI/HI/plan and denies auditory/visual hallucinations.  He does talk about mood lability and mood swings and wanted the Abilify to be titrated up in the dosage.  Provider and patient also discussed about optimizing the dosage of Prozac.  Provider educated patient upon the current GI symptoms, diverticulitis and to wait on adjusting the dose of Abilify to prevent any anticholinergic side effects.     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, previous jail time for 10 years Living Situation: Homeless Spiritual Hx: Religious- suicide against beliefs Access to weapons/lethal means: Denies   Substance History Alcohol: Daily use for several years Type of alcohol liquor Last Drink 04/29/24 Number of drinks per day several History of alcohol withdrawal seizures: Yes History of DT's: Denies Tobacco: Cigarettes Illicit drugs: Marijuana Prescription drug abuse: Endorses previous abuse of prescription drugs (Ritalin, Wellbutrin, Zoloft, others) but not recent  Rehab hx: Denies  Exam Findings  Physical Exam: Deferred to medical team note reviewed Vital Signs:  Temp:  [98 F (36.7 C)] 98 F (36.7 C) (11/01 0400) Pulse Rate:  [74] 74 (11/01 0400) Resp:  [18] 18 (11/01 0400) BP: (125)/(84) 125/84 (11/01 0400) SpO2:  [94 %] 94 % (11/01 0400) Blood pressure 125/84, pulse 74, temperature 98 F (36.7 C), resp. rate 18, SpO2 94%. There is no height or weight on file to calculate BMI.    Mental Status Exam: General Appearance: Casual  Orientation:  Full (Time, Place, and Person)  Memory:  Immediate;   Fair Recent;   Fair Remote;   Fair  Concentration:  Concentration: Fair and Attention Span: Fair  Recall:  Fair  Attention  Fair  Eye Contact:  Fair  Speech:  Clear and Coherent  Language:  Fair  Volume:  Normal  Mood: Euthymic  Affect:  Congruent  Thought Process:  Coherent  Thought  Content:  Logical  Suicidal Thoughts:  No  Homicidal Thoughts:  No  Judgement:  chronically limited  Insight:  chronically limited  Psychomotor Activity:  Normal  Akathisia:  No  Fund of Knowledge:  Fair      Assets:  Manufacturing Systems Engineer Desire for Improvement  Cognition:  WNL  ADL's:  Intact  AIMS (if indicated):        Other History   These have been pulled in through the EMR, reviewed, and updated if appropriate.  Family History:  The patient's family history includes Heart attack in his father and mother.  Medical History: Past Medical History:  Diagnosis Date   Alcohol abuse    Diverticulitis    Hypertension    Substance abuse (HCC)     Surgical History: Past Surgical History:  Procedure Laterality Date   ANKLE SURGERY     CHEST TUBE INSERTION     LITHOTRIPSY     TIBIA FRACTURE SURGERY     TONSILLECTOMY AND ADENOIDECTOMY  Medications:   Current Facility-Administered Medications:    acetaminophen  (TYLENOL ) tablet 650 mg, 650 mg, Oral, Q6H PRN, 650 mg at 05/07/24 0056 **OR** acetaminophen  (TYLENOL ) suppository 650 mg, 650 mg, Rectal, Q6H PRN, Josette, Richard, MD   amLODipine  (NORVASC ) tablet 5 mg, 5 mg, Oral, Daily, Hunter, Crystal L, PA-C, 5 mg at 05/07/24 0935   ARIPiprazole (ABILIFY) tablet 5 mg, 5 mg, Oral, QHS, Wieting, Richard, MD, 5 mg at 05/06/24 2122   chlordiazePOXIDE  (LIBRIUM ) capsule 25 mg, 25 mg, Oral, TID, Josette Ade, MD, 25 mg at 05/07/24 1819   [START ON 05/08/2024] ciprofloxacin  (CIPRO ) tablet 500 mg, 500 mg, Oral, BID, Zhang, Dekui, MD   doxycycline  (VIBRA -TABS) tablet 100 mg, 100 mg, Oral, Q12H, Zhang, Dekui, MD, 100 mg at 05/07/24 0933   FLUoxetine (PROZAC) capsule 20 mg, 20 mg, Oral, Daily, Jadapalle, Sree, MD, 20 mg at 05/07/24 0932   folic acid  (FOLVITE ) tablet 1 mg, 1 mg, Oral, Daily, Hunter, Crystal L, PA-C, 1 mg at 05/07/24 0933   gabapentin (NEURONTIN) capsule 300 mg, 300 mg, Oral, TID, Hunter, Crystal L, PA-C, 300 mg at  05/07/24 1819   HYDROmorphone  (DILAUDID ) injection 1 mg, 1 mg, Intravenous, Q4H PRN, Mansy, Jan A, MD, 1 mg at 05/07/24 9486   ketorolac  (TORADOL ) 15 MG/ML injection 15 mg, 15 mg, Intravenous, Q6H PRN, Mansy, Jan A, MD, 15 mg at 05/07/24 1820   melatonin tablet 5 mg, 5 mg, Oral, QHS, Zhang, Dekui, MD   meropenem  (MERREM ) 1 g in sodium chloride  0.9 % 100 mL IVPB, 1 g, Intravenous, Q8H, Laurita Pillion, MD, Last Rate: 200 mL/hr at 05/07/24 1951, 1 g at 05/07/24 1951   [START ON 05/08/2024] metroNIDAZOLE  (FLAGYL ) tablet 500 mg, 500 mg, Oral, Q12H, Zhang, Dekui, MD   multivitamin with minerals tablet 1 tablet, 1 tablet, Oral, Daily, Hunter, Crystal L, PA-C, 1 tablet at 05/07/24 9066   nicotine  (NICODERM CQ  - dosed in mg/24 hours) patch 14 mg, 14 mg, Transdermal, Daily, Hunter, Crystal L, PA-C, 14 mg at 05/07/24 0935   ondansetron  (ZOFRAN ) tablet 4 mg, 4 mg, Oral, Q6H PRN **OR** ondansetron  (ZOFRAN ) injection 4 mg, 4 mg, Intravenous, Q6H PRN, Josette, Richard, MD   oxyCODONE  (Oxy IR/ROXICODONE ) immediate release tablet 5-10 mg, 5-10 mg, Oral, Q4H PRN, Zhang, Dekui, MD, 10 mg at 05/07/24 1819   pantoprazole  (PROTONIX ) EC tablet 40 mg, 40 mg, Oral, Daily, Hunter, Crystal L, PA-C, 40 mg at 05/07/24 9066   thiamine  (VITAMIN B1) tablet 100 mg, 100 mg, Oral, Daily, Hunter, Crystal L, PA-C, 100 mg at 05/07/24 0933   traZODone (DESYREL) tablet 100 mg, 100 mg, Oral, QHS PRN, Smith, Annie B, NP, 100 mg at 05/06/24 2122  Allergies: Allergies  Allergen Reactions   Latex     Sree Jadapalle.MD

## 2024-05-07 NOTE — Progress Notes (Signed)
 Mobility Specialist - Progress Note   05/07/24 1200  Mobility  Activity Ambulated with assistance  Level of Assistance Independent after set-up  Assistive Device  (IV Stand)  Distance Ambulated (ft) 325 ft  Range of Motion/Exercises All extremities  Activity Response Tolerated well  Mobility visit 1 Mobility  Mobility Specialist Start Time (ACUTE ONLY) 1136  Mobility Specialist Stop Time (ACUTE ONLY) 1151  Mobility Specialist Time Calculation (min) (ACUTE ONLY) 15 min   Pt was supine in bed on RA with sitter in the room. Pt agreed to mobility. Pt is able to get to the EOB independently. Pt is able to STS independently with protective boot. Pt ambulated well. Pt did describe pain to be present but not significant. After activity pt returned to the room at the EOB with needs in reach and sitter in the room upon exit.  Clem Rodes Mobility Specialist 05/07/24, 12:40 PM

## 2024-05-07 NOTE — Progress Notes (Addendum)
 AC approved patient to have a 15 minute supervised visit with his girlfriend and she can bring his compression socks and blanket from home but they must be examined by security before given to the patient.  Christian Dickerson V Calla Wedekind

## 2024-05-07 NOTE — Progress Notes (Signed)
  Progress Note   Patient: Christian Dickerson FMW:[id number] DOB: March 18, 1986 DOA: 05/03/2024     4 DOS: the patient was seen and examined on 05/07/2024   Brief hospital course: LANDERS PRAJAPATI is a 38 y.o. male with medical history significant of alcohol abuse, hypertension and recent hospitalization last month for perforated diverticulitis requiring antibiotics and drainage tube.  Patient came in and was admitted to the psychiatric unit on 1026 for alcohol abuse and suicide attempt.  Medical team was consulted for rectal bleeding.  Patient does have some left lower quadrant abdominal pain.  CT scan of the abdomen pelvis showed sigmoid colon diverticulitis with adjacent 1.9 x 1.9 x 2 point centimeter air-fluid collection concerning for abscess/contained perforation.  Patient was seen by general surgery, planning for colectomy on 11/6.  Patient was placed on antibiotics with meropenem  based on susceptibility.   Principal Problem:   Colonic diverticular abscess Active Problems:   Rectal bleeding   Alcoholism (HCC)   Suicidal ideation   Essential hypertension   Obesity (BMI 30-39.9)   Tobacco abuse   Assessment and Plan: Colonic diverticular abscess Rectal bleeding. Likely this is persistent diverticular abscess that never improved.  Patient given a dose of Cipro  and Flagyl  this morning while on the psychiatric floor.  Patient currently treated with meropenem . Patient has been seen by general surgery, scheduled for colectomy on 11/6.  Will keep patient in the hospital before then. Rectal bleed appears to be secondary to diverticulosis with diverticulitis. Hemoglobin has been stable Continued IV meropenem , also added doxycycline  for possible orchitis.  Condition continue to improve, will discontinue meropenem  tomorrow, start oral Cipro  and Flagyl  tomorrow.   Left-sided testicle pain with swelling. Happened 10/29, ultrasound with Doppler did not see any possibility of a torsion. I added  doxycycline .    Alcoholism (HCC) Advised to quit, continue thiamine  and folic acid .  No evidence of alcohol withdrawal.   Tobacco abuse Nicotine  patch   Obesity (BMI 30-39.9) BMI 31.86   Essential hypertension Continue Norvasc    Suicidal ideation With mood disorder.  Patient still under IVC on Abilify and Prozac      Subjective:  Patient could not sleep last night, is sleeping this morning.  No confusion.  Physical Exam: Vitals:   05/06/24 0324 05/06/24 0731 05/06/24 1908 05/07/24 0400  BP: 135/85 115/76 123/71 125/84  Pulse: 80 79 82 74  Resp: 16 16 16 18   Temp: 98 F (36.7 C) 97.8 F (36.6 C) 98.3 F (36.8 C) 98 F (36.7 C)  TempSrc:      SpO2: 99% 100% 97% 94%   General exam: Appears calm and comfortable  Respiratory system: Clear to auscultation. Respiratory effort normal. Cardiovascular system: S1 & S2 heard, RRR. No JVD, murmurs, rubs, gallops or clicks. No pedal edema. Gastrointestinal system: Abdomen is nondistended, soft and nontender. No organomegaly or masses felt. Normal bowel sounds heard. Central nervous system: Alert and oriented. No focal neurological deficits. Extremities: Symmetric 5 x 5 power. Skin: No rashes, lesions or ulcers Psychiatry: Judgement and insight appear normal. Mood & affect appropriate.    Data Reviewed:  Lab results reviewed  Family Communication: None  Disposition: Status is: Inpatient Remains inpatient appropriate because: Severity of disease, pending procedure     Time spent: 35 minutes  Author: Murvin Mana, MD 05/07/2024 1:00 PM  For on call review [email or web address]

## 2024-05-07 NOTE — Progress Notes (Signed)
 Patient is at the nurses station becoming escalated because he wants to be removed from IVC so he can have unsupervised visits from his girlfriend and so can have his blanket and compression socks brought from home. He is escalating and hit the nurses station with his hand, he said he is going to start tearing shit up so he can get off this unit. Security called, on unit now. Sitter said patient is escalating because he hasn't been able to get in touch with his girlfriend on the phone much of the day.  Katyana Trolinger V Denean Pavon

## 2024-05-08 DIAGNOSIS — F3132 Bipolar disorder, current episode depressed, moderate: Secondary | ICD-10-CM | POA: Diagnosis not present

## 2024-05-08 DIAGNOSIS — K5720 Diverticulitis of large intestine with perforation and abscess without bleeding: Secondary | ICD-10-CM | POA: Diagnosis not present

## 2024-05-08 DIAGNOSIS — K625 Hemorrhage of anus and rectum: Secondary | ICD-10-CM | POA: Diagnosis not present

## 2024-05-08 NOTE — Plan of Care (Signed)

## 2024-05-08 NOTE — Progress Notes (Signed)
 Progress Note   Patient: Christian Dickerson FMW:[id number] DOB: Jul 13, 1985 DOA: 05/03/2024     5 DOS: the patient was seen and examined on 05/08/2024   Brief hospital course: TYRION GLAUDE is a 38 y.o. male with medical history significant of alcohol abuse, hypertension and recent hospitalization last month for perforated diverticulitis requiring antibiotics and drainage tube.  Patient came in and was admitted to the psychiatric unit on 1026 for alcohol abuse and suicide attempt.  Medical team was consulted for rectal bleeding.  Patient does have some left lower quadrant abdominal pain.  CT scan of the abdomen pelvis showed sigmoid colon diverticulitis with adjacent 1.9 x 1.9 x 2 point centimeter air-fluid collection concerning for abscess/contained perforation.  Patient was seen by general surgery, planning for colectomy on 11/6.  Patient was placed on antibiotics with meropenem  based on susceptibility.   Principal Problem:   Colonic diverticular abscess Active Problems:   Rectal bleeding   Alcoholism (HCC)   Suicidal ideation   Essential hypertension   Obesity (BMI 30-39.9)   Tobacco abuse   Bipolar affective disorder, currently depressed, moderate (HCC)   Assessment and Plan:  Colonic diverticular abscess Rectal bleeding. Likely this is persistent diverticular abscess that never improved.  Patient given a dose of Cipro  and Flagyl  this morning while on the psychiatric floor.  Patient currently treated with meropenem . Patient has been seen by general surgery, scheduled for colectomy on 11/6.  Will keep patient in the hospital before then. Rectal bleed appears to be secondary to diverticulosis with diverticulitis. Hemoglobin has been stable Continued IV meropenem , also added doxycycline  for possible orchitis.  Patient condition continue to improve, discontinued meropenem  on 11/2, started Cipro  and Flagyl .   Left-sided testicle pain with swelling. Happened 10/29, ultrasound with Doppler  did not see any possibility of a torsion. I added doxycycline . Condition improving.     Alcoholism (HCC) Advised to quit, continue thiamine  and folic acid .  No evidence of alcohol withdrawal.   Tobacco abuse Nicotine  patch   Obesity (BMI 30-39.9) BMI 31.86   Essential hypertension Continue Norvasc    Suicidal ideation With mood disorder.  Patient still under IVC on Abilify and Prozac Patient had increased agitation yesterday, seen by psychiatry, still required IVC.         Subjective:  Patient was agitated yesterday, but better today.  Still has mild scrotal pain, overall improved.  Abdominal pain improved.  Physical Exam: Vitals:   05/07/24 0400 05/07/24 2016 05/08/24 0340 05/08/24 0914  BP: 125/84 117/81 117/76 124/70  Pulse: 74 86 69 80  Resp: 18 17 18 20   Temp: 98 F (36.7 C) 97.7 F (36.5 C) 98 F (36.7 C) 97.6 F (36.4 C)  TempSrc:      SpO2: 94% 97% 99% 98%  Weight:  106.6 kg    Height:  6' 1 (1.854 m)     General exam: Appears calm and comfortable  Respiratory system: Clear to auscultation. Respiratory effort normal. Cardiovascular system: S1 & S2 heard, RRR. No JVD, murmurs, rubs, gallops or clicks. No pedal edema. Gastrointestinal system: Abdomen is nondistended, soft and nontender. No organomegaly or masses felt. Normal bowel sounds heard. Central nervous system: Alert and oriented. No focal neurological deficits. Extremities: Symmetric 5 x 5 power. Skin: No rashes, lesions or ulcers Psychiatry: Judgement and insight appear normal. Mood & affect appropriate.    Data Reviewed:  No lab results.  Family Communication: None  Disposition: Status is: Inpatient Remains inpatient appropriate because: Severity of disease pending  procedure     Time spent: 35 minutes  Author: Murvin Mana, MD 05/08/2024 11:01 AM  For on call review [email or web address]

## 2024-05-09 DIAGNOSIS — F3132 Bipolar disorder, current episode depressed, moderate: Secondary | ICD-10-CM | POA: Diagnosis not present

## 2024-05-09 DIAGNOSIS — K5720 Diverticulitis of large intestine with perforation and abscess without bleeding: Secondary | ICD-10-CM | POA: Diagnosis not present

## 2024-05-09 DIAGNOSIS — R45851 Suicidal ideations: Secondary | ICD-10-CM | POA: Diagnosis not present

## 2024-05-09 MED ORDER — LIDOCAINE 5 % EX PTCH
1.0000 | MEDICATED_PATCH | CUTANEOUS | Status: DC
  Administered 2024-05-09 – 2024-05-11 (×3): 1 via TRANSDERMAL
  Filled 2024-05-09 (×5): qty 1

## 2024-05-09 NOTE — Progress Notes (Signed)
 Progress Note   Patient: Christian Dickerson FMW:[id number] DOB: [birth year] DOA: 05/03/2024     6 DOS: the patient was seen and examined on 05/09/2024   Brief hospital course: TINA TEMME is a 38 y.o. male with medical history significant of alcohol abuse, hypertension and recent hospitalization last month for perforated diverticulitis requiring antibiotics and drainage tube.  Patient came in and was admitted to the psychiatric unit on 1026 for alcohol abuse and suicide attempt.  Medical team was consulted for rectal bleeding.  Patient does have some left lower quadrant abdominal pain.  CT scan of the abdomen pelvis showed sigmoid colon diverticulitis with adjacent 1.9 x 1.9 x 2 point centimeter air-fluid collection concerning for abscess/contained perforation.  Patient was seen by general surgery, planning for colectomy on 11/6.  Patient was placed on antibiotics with meropenem  based on susceptibility.   Principal Problem:   Colonic diverticular abscess Active Problems:   Rectal bleeding   Alcoholism (HCC)   Suicidal ideation   Essential hypertension   Obesity (BMI 30-39.9)   Tobacco abuse   Bipolar affective disorder, currently depressed, moderate (HCC)   Assessment and Plan: Colonic diverticular abscess Rectal bleeding. Likely this is persistent diverticular abscess that never improved.  Patient given a dose of Cipro  and Flagyl  this morning while on the psychiatric floor.  Patient currently treated with meropenem . Patient has been seen by general surgery, scheduled for colectomy on 11/6.  Will keep patient in the hospital before then. Rectal bleed appears to be secondary to diverticulosis with diverticulitis. Hemoglobin has been stable Continued IV meropenem , also added doxycycline  for possible orchitis.  Patient condition continue to improve, discontinued meropenem  on 11/2, started Cipro  and Flagyl . Condition still stable.  Continue to follow.   Left-sided testicle pain with  swelling. Happened 10/29, ultrasound with Doppler did not see any possibility of a torsion. I added doxycycline . Condition improving.     Alcoholism (HCC) Advised to quit, continue thiamine  and folic acid .  No evidence of alcohol withdrawal.   Tobacco abuse Nicotine  patch   Obesity (BMI 30-39.9) BMI 31.86   Essential hypertension Continue Norvasc    Suicidal ideation With mood disorder.  Patient still under IVC on Abilify and Prozac Patient had increased agitation yesterday, seen by psychiatry, still required IVC.   Right ankle swelling and pain. From fall, initial CT scan did not show any fracture. Added Lidoderm  patch      Subjective:  Patient complaining of right ankle pain, and swelling.  Still has some scrotal pain.  Abdominal pain is better.  Physical Exam: Vitals:   05/08/24 0914 05/08/24 2053 05/09/24 0544 05/09/24 0832  BP: 124/70 125/75 112/71 115/74  Pulse: 80 77 75 71  Resp: 20 20 16 17   Temp: 97.6 F (36.4 C) (!) 97.5 F (36.4 C) (!) 97.5 F (36.4 C) 97.6 F (36.4 C)  TempSrc:  Oral Oral   SpO2: 98% 97% 98% 97%  Weight:      Height:       General exam: Appears calm and comfortable  Respiratory system: Clear to auscultation. Respiratory effort normal. Cardiovascular system: S1 & S2 heard, RRR. No JVD, murmurs, rubs, gallops or clicks. No pedal edema. Gastrointestinal system: Abdomen is nondistended, soft and nontender. No organomegaly or masses felt. Normal bowel sounds heard. Central nervous system: Alert and oriented. No focal neurological deficits. Extremities: Right ankle swelling. Skin: No rashes, lesions or ulcers Psychiatry: Judgement and insight appear normal. Mood & affect appropriate.    Data Reviewed:  CT  scan lab results reviewed.  Family Communication: None  Disposition: Status is: Inpatient Remains inpatient appropriate because: Severity of disease, pending inpatient procedure.     Time spent: 35  minutes  Author: Murvin Mana, MD 05/09/2024 12:38 PM  For on call review [email or web address]

## 2024-05-09 NOTE — Plan of Care (Signed)

## 2024-05-09 NOTE — Consult Note (Signed)
 [facility] Consult Follow up  Patient Name: .Christian Dickerson  MRN: [id number]  DOB: 1986-03-04  Consult Order details:  Orders (From admission, onward)     Start     Ordered   05/03/24 1003  IP CONSULT TO PSYCHIATRY       Comments: DR Josette savoy  Ordering Provider: Josette Ade, MD  Provider:  (Not yet assigned)  Question Answer Comment  Location [facility]   Reason for Consult? suicidal ideation      05/03/24 1002             Mode of Visit: In person    Psychiatry Consult Evaluation  Service Date: May 09, 2024 LOS:  LOS: 6 days  Chief Complaint I may have to have surgery  Primary Psychiatric Diagnoses  Suicidal ideation alcoholism   Assessment  Christian Dickerson is a 38 y.o. male admitted: Medically  05/09/2024 Patient is agitated that his pain is not adequately managed, he understands that he needs the treatment and is agreeable with current treatment plan. Denies current SI/HI/Avh. He is compliant with psychiatric medications. Discussed that we will continue to follow. He noted some frustration about the sitter but discussed that is hospital policy.   05/07/2024: Patient is assessed again on the inpatient unit at the request of the patient request to discuss his medications. Patient has a sitter at bedside and requested for consideration to become voluntary. Patient denied suicidal thoughts, has been compliant with medications, and his treatment plan. He will require surgery on the his right leg and states that he understands the necessity of the surgery and will be compliant with recommendations. Discussed conditions of voluntary status with patient and he was agreeable (he had in his belongings a taser, pepper spray, and fingernail clippers that he was agreeable to leave in the possession of security).  Update: During process of commitment status change, patient stated to this provider that he was not comfortable with the  sitter being removed from his room. I don't trust my thoughts when I am alone. Discussed at length with patient that he had options to talk with staff in those moments and he continued to disagree that he did not trust his thoughts. He requested to leave IVC in place.  Update: RN contacted provider that patient became irrate when he was informed that his GF would not be allowed to bring outside food to the unit and would have limited visitation time. Slamming his fists on the nursing station and be very difficult to redirect. PRN medication for agitation ordered.  05/05/2024:Patient was transferred to the medical floor from inpatient psychiatric unit at [facility] due to findings on CT scan.  Patient was rounded on today by this provider.  Patient currently denied suicidal homicidal ideation.  He denied any auditory or visual hallucinations.  His main concern was potentially about having surgery and care plan once transferred to the medical floor.  Patient reported that treatment center of [facility] [facility] told him that he would be accepted after he got his medical issues addressed.  At this time, patient is currently being medically admitted for further workup of symptoms.  Psychiatry will continue to round on patient while on the medical floor. 05/05/2024: Patient denies SI/HI/plan and denies hallucinations.  Patient is maintaining safe behaviors on the unit and is willing to participate in medical care.  Patient is able to disc adjustment in his psychotropic medications and is aware that dosage optimization is being held in the context  of his diverticulitis to minimize any side of due to psychotropics. 05/04/2024: Patient was seen on rounds today by psychiatry.  Patient reported currently doing okay.  He did ask if gabapentin normally makes people sleepy, we discussed side effects of gabapentin.  Patient does not wish to adjust current medication dose but we discussed that if sleepiness continues we can always further  titrate gabapentin down.  Patient verbalized understanding at this time.  Patient denied suicidal or homicidal thoughts.  He denied auditory or visual hallucinations as well.  He reported that medical team plans to do surgery next Thursday and that he would be staying in the hospital until then.  He did report poor sleep last night and reported that he only received 50 mg of trazodone.  He asked for medication to be adjusted back to his normal 100 mg dose nightly.  Psychiatry made these medication changes at this time.  Patient was alert and oriented x 4 and responding to questions appropriately.  Patient was currently displaying logical coherent thought process.  Patient did not appear to be responding to internal stimuli and did not appear to be manic at this time.  Psychiatry will continue to round on patient while on the medical floor.  Diagnoses:  Active Hospital problems: Principal Problem:   Colonic diverticular abscess Active Problems:   Alcoholism (HCC)   Suicidal ideation   Rectal bleeding   Essential hypertension   Obesity (BMI 30-39.9)   Tobacco abuse   Bipolar affective disorder, currently depressed, moderate (HCC)    Plan   ## Psychiatric Medication Recommendations:  Continue current medications from inpatient unit -Increased trazodone to 100 mg nightly PRN  ## Medical Decision Making Capacity: Not specifically addressed in this encounter     ## Disposition:--Psychiatry will continue to round on patient while on the medical floor  ## Behavioral / Environmental: - No specific recommendations at this time.     ## Safety and Observation Level:  - Based on my clinical evaluation, I estimate the patient to be at moderate risk of self harm in the current setting. - At this time, we recommend  1:1 Observation. This decision is based on my review of the chart including patient's history and current presentation, interview of the patient, mental status examination, and  consideration of suicide risk including evaluating suicidal ideation, plan, intent, suicidal or self-harm behaviors, risk factors, and protective factors. This judgment is based on our ability to directly address suicide risk, implement suicide prevention strategies, and develop a safety plan while the patient is in the clinical setting. Please contact our team if there is a concern that risk level has changed.  CSSR Risk Category:C-SSRS RISK CATEGORY: Moderate Risk  Suicide Risk Assessment: Patient has following modifiable risk factors for suicide: recklessness, which we are addressing by psychiatry continuing to round on patient while being medically admitted. Patient has following non-modifiable or demographic risk factors for suicide: male gender and psychiatric hospitalization Patient has the following protective factors against suicide: Access to outpatient mental health care  Thank you for this consult request. Recommendations have been communicated to the primary team.  [name]          History of Present Illness  Relevant Aspects of Hospital Hospital   Patient Report: Obtained from initial H&P on inpatient psych unit Patient is a 38 year old male with a past psychiatric history of alcohol use disorder who presented to the [facility] ED complaining of  suicide attempt by taking Twenty Nine 5 mg BP pills with two 40 oz beers. Throughout evaluation in ED, patient's report was noted to be inconsistent with stating he overdosed then denying he overdosed.  Patient was initially admitted to inpatient psychiatric unit for stabilization.  During the hospital stay he had blood in stool.  Hospitalist was consulted and GI consult was made.  CT abdomen showed diverticulitis and patient has been transferred to medical floor for further medical care.  Today on interview patient reports is frustrated about his pain and swelling. He understands he needs surgery but  feels the meds are not helping and no one is listening to him. He denies SI/HI/plan and denies auditory/visual hallucinations. He feels mood lability has improved primary concern at present is medical.  He is forward thinking. He has been irritable per the notes but was pleasant on exam.     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, previous jail time for 10 years Living Situation: Homeless Spiritual Hx: Religious- suicide against beliefs Access to weapons/lethal means: Denies   Substance History Alcohol: Daily use for several years Type of alcohol liquor Last Drink 04/29/24 Number of drinks per day several History of alcohol withdrawal seizures: Yes History of DT's: Denies Tobacco: Cigarettes Illicit drugs: Marijuana Prescription drug abuse: Endorses previous abuse of prescription drugs (Ritalin, Wellbutrin, Zoloft, others) but not recent  Rehab hx: Denies  Exam Findings  Physical Exam: Deferred to medical team note reviewed Vital Signs:  Temp:  [97.5 F (36.4 C)-97.6 F (36.4 C)] 97.6 F (36.4 C) (11/03 0832) Pulse Rate:  [71-77] 71 (11/03 0832) Resp:  [16-20] 17 (11/03 0832) BP: (112-125)/(71-75) 115/74 (11/03 0832) SpO2:  [97 %-98 %] 97 % (11/03 0832) Blood pressure 115/74, pulse 71, temperature 97.6 F (36.4 C), resp. rate 17, height 6' 1 (1.854 m), weight 106.6 kg, SpO2 97%. Body mass index is 31 kg/m.    Mental  Status Exam: General Appearance: Casual  Orientation:  Full (Time, Place, and Person)  Memory:  Immediate;   Fair Recent;   Fair Remote;   Fair  Concentration:  Concentration: Fair and Attention Span: Fair  Recall:  Fair  Attention  Fair  Eye Contact:  Fair  Speech:  Clear and Coherent  Language:  Fair  Volume:  Normal  Mood: Euthymic  Affect:  Congruent  Thought Process:  Coherent  Thought Content:  Logical  Suicidal Thoughts:  No  Homicidal Thoughts:  No  Judgement:  chronically limited  Insight:  chronically limited  Psychomotor Activity:  Normal  Akathisia:  No  Fund of Knowledge:  Fair      Assets:  Manufacturing Systems Engineer Desire for Improvement  Cognition:  WNL  ADL's:  Intact  AIMS (if indicated):        Other History   These have been pulled in through the EMR, reviewed, and updated if appropriate.  Family History:  The patient's family history includes Heart attack in his father and mother.  Medical History: Past Medical History:  Diagnosis  Date   Alcohol abuse    Diverticulitis    Hypertension    Substance abuse (HCC)     Surgical History: Past Surgical History:  Procedure Laterality Date   ANKLE SURGERY     CHEST TUBE INSERTION     LITHOTRIPSY     TIBIA FRACTURE SURGERY     TONSILLECTOMY AND ADENOIDECTOMY       Medications:   Current Facility-Administered Medications:    acetaminophen  (TYLENOL ) tablet 650 mg, 650 mg, Oral, Q6H PRN, 650 mg at 05/07/24 0056 **OR** acetaminophen  (TYLENOL ) suppository 650 mg, 650 mg, Rectal, Q6H PRN, Josette, Richard, MD   amLODipine  (NORVASC ) tablet 5 mg, 5 mg, Oral, Daily, Hunter, Crystal L, PA-C, 5 mg at 05/09/24 9085   ARIPiprazole (ABILIFY) tablet 5 mg, 5 mg, Oral, QHS, Wieting, Richard, MD, 5 mg at 05/08/24 2106   chlordiazePOXIDE  (LIBRIUM ) capsule 25 mg, 25 mg, Oral, TID, Josette, Richard, MD, 25 mg at 05/09/24 0914   ciprofloxacin  (CIPRO ) tablet 500 mg, 500 mg, Oral, BID, Zhang, Dekui, MD, 500 mg at  05/09/24 9085   doxycycline  (VIBRA -TABS) tablet 100 mg, 100 mg, Oral, Q12H, Zhang, Dekui, MD, 100 mg at 05/09/24 0914   FLUoxetine (PROZAC) capsule 20 mg, 20 mg, Oral, Daily, Jadapalle, Sree, MD, 20 mg at 05/09/24 9085   folic acid  (FOLVITE ) tablet 1 mg, 1 mg, Oral, Daily, Hunter, Crystal L, PA-C, 1 mg at 05/09/24 0914   gabapentin (NEURONTIN) capsule 300 mg, 300 mg, Oral, TID, Hunter, Crystal L, PA-C, 300 mg at 05/09/24 9085   ketorolac  (TORADOL ) 15 MG/ML injection 15 mg, 15 mg, Intravenous, Q6H PRN, Mansy, Jan A, MD, 15 mg at 05/09/24 1226   lidocaine  (LIDODERM ) 5 % 1 patch, 1 patch, Transdermal, Q24H, Zhang, Dekui, MD, 1 patch at 05/09/24 1226   melatonin tablet 5 mg, 5 mg, Oral, QHS, Zhang, Dekui, MD, 5 mg at 05/08/24 2106   metroNIDAZOLE  (FLAGYL ) tablet 500 mg, 500 mg, Oral, Q12H, Zhang, Dekui, MD, 500 mg at 05/09/24 9085   multivitamin with minerals tablet 1 tablet, 1 tablet, Oral, Daily, Hunter, Crystal L, PA-C, 1 tablet at 05/09/24 0914   nicotine  (NICODERM CQ  - dosed in mg/24 hours) patch 14 mg, 14 mg, Transdermal, Daily, Hunter, Crystal L, PA-C, 14 mg at 05/09/24 0915   ondansetron  (ZOFRAN ) tablet 4 mg, 4 mg, Oral, Q6H PRN **OR** ondansetron  (ZOFRAN ) injection 4 mg, 4 mg, Intravenous, Q6H PRN, Wieting, Richard, MD   oxyCODONE  (Oxy IR/ROXICODONE ) immediate release tablet 5-10 mg, 5-10 mg, Oral, Q4H PRN, Zhang, Dekui, MD, 10 mg at 05/09/24 9071   pantoprazole  (PROTONIX ) EC tablet 40 mg, 40 mg, Oral, Daily, Hunter, Crystal L, PA-C, 40 mg at 05/09/24 9085   thiamine  (VITAMIN B1) tablet 100 mg, 100 mg, Oral, Daily, Hunter, Crystal L, PA-C, 100 mg at 05/09/24 0914   traZODone (DESYREL) tablet 100 mg, 100 mg, Oral, QHS PRN, Smith, Annie B, NP, 100 mg at 05/08/24 2106  Allergies: Allergies  Allergen Reactions   Latex

## 2024-05-09 NOTE — Clinical Note (Incomplete)
Pt decline

## 2024-05-10 DIAGNOSIS — K5720 Diverticulitis of large intestine with perforation and abscess without bleeding: Secondary | ICD-10-CM | POA: Diagnosis not present

## 2024-05-10 DIAGNOSIS — R45851 Suicidal ideations: Secondary | ICD-10-CM | POA: Diagnosis not present

## 2024-05-10 DIAGNOSIS — F3132 Bipolar disorder, current episode depressed, moderate: Secondary | ICD-10-CM | POA: Diagnosis not present

## 2024-05-10 NOTE — Progress Notes (Signed)
 Progress Note   Patient: Christian Dickerson FMW:[id number] DOB: 1986/03/19 DOA: 05/03/2024     7 DOS: the patient was seen and examined on 05/10/2024   Brief hospital course: Christian Dickerson is a 38 y.o. male with medical history significant of alcohol abuse, hypertension and recent hospitalization last month for perforated diverticulitis requiring antibiotics and drainage tube.  Patient came in and was admitted to the psychiatric unit on 1026 for alcohol abuse and suicide attempt.  Medical team was consulted for rectal bleeding.  Patient does have some left lower quadrant abdominal pain.  CT scan of the abdomen pelvis showed sigmoid colon diverticulitis with adjacent 1.9 x 1.9 x 2 point centimeter air-fluid collection concerning for abscess/contained perforation.  Patient was seen by general surgery, planning for colectomy on 11/6.  Patient was placed on antibiotics with meropenem  based on susceptibility.   Principal Problem:   Colonic diverticular abscess Active Problems:   Rectal bleeding   Alcoholism (HCC)   Suicidal ideation   Essential hypertension   Obesity (BMI 30-39.9)   Tobacco abuse   Bipolar affective disorder, currently depressed, moderate (HCC)   Assessment and Plan: Colonic diverticular abscess Rectal bleeding. Likely this is persistent diverticular abscess that never improved.  Patient given a dose of Cipro  and Flagyl  this morning while on the psychiatric floor.  Patient currently treated with meropenem . Patient has been seen by general surgery, scheduled for colectomy on 11/6.  Will keep patient in the hospital before then. Rectal bleed appears to be secondary to diverticulosis with diverticulitis. Hemoglobin has been stable Continued IV meropenem , also added doxycycline  for possible orchitis.  Patient condition continue to improve, discontinued meropenem  on 11/2, started Cipro  and Flagyl . Condition still stable.  Surgery scheduled on Thursday.   Left-sided testicle pain  with swelling. Happened 10/29, ultrasound with Doppler did not see any possibility of a torsion. I added doxycycline . Will finish 7 days course, condition improving.     Alcoholism (HCC) Advised to quit, continue thiamine  and folic acid .  No evidence of alcohol withdrawal.   Tobacco abuse Nicotine  patch   Obesity (BMI 30-39.9) BMI 31.86   Essential hypertension Continue Norvasc    Suicidal ideation With mood disorder.  Patient still under IVC on Abilify and Prozac Patient had increased agitation yesterday, seen by psychiatry, still required IVC.   Right ankle swelling and pain. From fall, initial CT scan did not show any fracture. Added Lidoderm  patch            Subjective:  Complaining some pain in the scrotum, right ankle pain.  Physical Exam: Vitals:   05/09/24 1709 05/09/24 1945 05/10/24 0537 05/10/24 0720  BP: 135/83 132/76 122/83 120/85  Pulse: 86 84 77 75  Resp: 17 17 17 16   Temp: 98.9 F (37.2 C) 98.4 F (36.9 C) 97.6 F (36.4 C) (!) 97.5 F (36.4 C)  TempSrc: Oral Oral Oral Oral  SpO2: 97% 97% 99% 99%  Weight:      Height:       General exam: Appears calm and comfortable  Respiratory system: Clear to auscultation. Respiratory effort normal. Cardiovascular system: S1 & S2 heard, RRR. No JVD, murmurs, rubs, gallops or clicks. No pedal edema. Gastrointestinal system: Abdomen is nondistended, soft and nontender. No organomegaly or masses felt. Normal bowel sounds heard. Central nervous system: Alert and oriented. No focal neurological deficits. Extremities: Symmetric 5 x 5 power. Skin: No rashes, lesions or ulcers Psychiatry: Judgement and insight appear normal. Mood & affect appropriate.    Data  Reviewed:  There are no new results to review at this time.  Family Communication: None  Disposition: Status is: Inpatient Remains inpatient appropriate because: Severity of disease, pending inpatient procedure     Time spent: 35  minutes  Author: Murvin Mana, MD 05/10/2024 3:09 PM  For on call review [email or web address].

## 2024-05-10 NOTE — Consult Note (Signed)
 [facility] Consult Follow up  Patient Name: .Christian Dickerson  MRN: [id number]  DOB: 1986-06-10  Consult Order details:  Orders (From admission, onward)     Start     Ordered   05/03/24 1003  IP CONSULT TO PSYCHIATRY       Comments: DR Josette savoy  Ordering Provider: Josette Ade, MD  Provider:  (Not yet assigned)  Question Answer Comment  Location [facility]   Reason for Consult? suicidal ideation      05/03/24 1002             Mode of Visit: In person    Psychiatry Consult Evaluation  Service Date: May 10, 2024 LOS:  LOS: 7 days  Chief Complaint I may have to have surgery  Primary Psychiatric Diagnoses  Suicidal ideation alcoholism   Assessment  Christian Dickerson is a 38 y.o. male admitted: Medically  11/4: Seen for follow-up they are alert and oriented.  They are compliant with medical recommendations.  They deny SI, HI, and AVH.  They are linear logical and future oriented.  They continue to desire to seek treatment but note that it depends on whether or not they get an ostomy bag.  He appears to be cooperating with nursing and med staff today.   05/09/2024 Patient is agitated that his pain is not adequately managed, he understands that he needs the treatment and is agreeable with current treatment plan. Denies current SI/HI/Avh. He is compliant with psychiatric medications. Discussed that we will continue to follow. He noted some frustration about the sitter but discussed that is hospital policy.   05/07/2024: Patient is assessed again on the inpatient unit at the request of the patient request to discuss his medications. Patient has a sitter at bedside and requested for consideration to become voluntary. Patient denied suicidal thoughts, has been compliant with medications, and his treatment plan. He will require surgery on the his right leg and states that he understands the necessity of the surgery and will be compliant  with recommendations. Discussed conditions of voluntary status with patient and he was agreeable (he had in his belongings a taser, pepper spray, and fingernail clippers that he was agreeable to leave in the possession of security).  Update: During process of commitment status change, patient stated to this provider that he was not comfortable with the sitter being removed from his room. I don't trust my thoughts when I am alone. Discussed at length with patient that he had options to talk with staff in those moments and he continued to disagree that he did not trust his thoughts. He requested to leave IVC in place.  Update: RN contacted provider that patient became irrate when he was informed that his GF would not be allowed to bring outside food to the unit and would have limited visitation time. Slamming his fists on the nursing station and be very difficult to redirect. PRN medication for agitation ordered.  05/05/2024:Patient was transferred to the medical floor from inpatient psychiatric unit at [facility] due to findings on CT scan.  Patient was rounded on today by this provider.  Patient currently denied suicidal homicidal ideation.  He denied any auditory or visual hallucinations.  His main concern was potentially about having surgery and care plan once transferred to the medical floor.  Patient reported that treatment center of  told him that he would be accepted after he got his medical issues addressed.  At this time, patient is currently being  medically admitted for further workup of symptoms.  Psychiatry will continue to round on patient while on the medical floor. 05/05/2024: Patient denies SI/HI/plan and denies hallucinations.  Patient is maintaining safe behaviors on the unit and is willing to participate in medical care.  Patient is able to disc adjustment in his psychotropic medications and is aware that dosage optimization is being held in the context of his diverticulitis to minimize any  side of due to psychotropics. 05/04/2024: Patient was seen on rounds today by psychiatry.  Patient reported currently doing okay.  He did ask if gabapentin normally makes people sleepy, we discussed side effects of gabapentin.  Patient does not wish to adjust current medication dose but we discussed that if sleepiness continues we can always further titrate gabapentin down.  Patient verbalized understanding at this time.  Patient denied suicidal or homicidal thoughts.  He denied auditory or visual hallucinations as well.  He reported that medical team plans to do surgery next Thursday and that he would be staying in the hospital until then.  He did report poor sleep last night and reported that he only received 50 mg of trazodone.  He asked for medication to be adjusted back to his normal 100 mg dose nightly.  Psychiatry made these medication changes at this time.  Patient was alert and oriented x 4 and responding to questions appropriately.  Patient was currently displaying logical coherent thought process.  Patient did not appear to be responding to internal stimuli and did not appear to be manic at this time.  Psychiatry will continue to round on patient while on the medical floor.  Diagnoses:  Active Hospital problems: Principal Problem:   Colonic diverticular abscess Active Problems:   Alcoholism (HCC)   Suicidal ideation   Rectal bleeding   Essential hypertension   Obesity (BMI 30-39.9)   Tobacco abuse   Bipolar affective disorder, currently depressed, moderate (HCC)    Plan   ## Psychiatric Medication Recommendations:  Continue current medications from inpatient unit -Increased trazodone to 100 mg nightly PRN  ## Medical Decision Making Capacity: Not specifically addressed in this encounter     ## Disposition:--Psychiatry will continue to round on patient while on the medical floor  ## Behavioral / Environmental: - No specific recommendations at this time.     ## Safety and  Observation Level:  - Based on my clinical evaluation, I estimate the patient to be at moderate risk of self harm in the current setting. - At this time, we recommend  1:1 Observation. This decision is based on my review of the chart including patient's history and current presentation, interview of the patient, mental status examination, and consideration of suicide risk including evaluating suicidal ideation, plan, intent, suicidal or self-harm behaviors, risk factors, and protective factors. This judgment is based on our ability to directly address suicide risk, implement suicide prevention strategies, and develop a safety plan while the patient is in the clinical setting. Please contact our team if there is a concern that risk level has changed.  CSSR Risk Category:C-SSRS RISK CATEGORY: Moderate Risk  Suicide Risk Assessment: Patient has following modifiable risk factors for suicide: recklessness, which we are addressing by psychiatry continuing to round on patient while being medically admitted. Patient has following non-modifiable or demographic risk factors for suicide: male gender and psychiatric hospitalization Patient has the following protective factors against suicide: Access to outpatient mental health care  Thank you for this consult request. Recommendations have been communicated to the primary  team.  [name]          History of Present Illness  Relevant Aspects of Hospital Hospital   Patient Report: Obtained from initial H&P on inpatient psych unit Patient is a 38 year old male with a past psychiatric history of alcohol use disorder who presented to the [facility] ED complaining of suicide attempt by taking Twenty Nine 5 mg BP pills with two 40 oz beers. Throughout evaluation in ED, patient's report was noted to be inconsistent with stating he overdosed then denying he overdosed.  Patient was initially admitted to inpatient psychiatric unit for  stabilization.  During the hospital stay he had blood in stool.  Hospitalist was consulted and GI consult was made.  CT abdomen showed diverticulitis and patient has been transferred to medical floor for further medical care.  Today on interview he is alert and oriented.  He feels his pain needs are being more appropriately managed.  He is optimistic for his procedure coming up on the 6.  He is linear logical and future oriented.  He denies SI, HI, and AVH.  He voices no concerns or complaints at this time.  He reports improved sleep, stable appetite and mood.  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, previous jail time for 10 years Living Situation: Homeless Spiritual Hx: Religious- suicide against beliefs Access to weapons/lethal means: Denies   Substance History Alcohol: Daily use for several years Type of alcohol liquor Last Drink 04/29/24 Number of drinks per day several History of alcohol withdrawal seizures: Yes History of DT's: Denies Tobacco: Cigarettes Illicit drugs: Marijuana Prescription drug abuse: Endorses previous abuse of prescription drugs (Ritalin, Wellbutrin, Zoloft, others) but not recent  Rehab hx: Denies  Exam Findings  Physical Exam: Deferred to medical team note reviewed Vital Signs:  Temp:  [97.5 F (36.4 C)-98.9 F (37.2 C)] 97.5 F (36.4 C) (11/04 1523) Pulse Rate:  [75-86] 78 (11/04 1523) Resp:   [16-18] 18 (11/04 1523) BP: (120-135)/(76-86) 125/86 (11/04 1523) SpO2:  [97 %-100 %] 100 % (11/04 1523) Blood pressure 125/86, pulse 78, temperature (!) 97.5 F (36.4 C), resp. rate 18, height 6' 1 (1.854 m), weight 106.6 kg, SpO2 100%. Body mass index is 31 kg/m.    Mental Status Exam: General Appearance: Casual  Orientation:  Full (Time, Place, and Person)  Memory:  Immediate;   Fair Recent;   Fair Remote;   Fair  Concentration:  Concentration: Fair and Attention Span: Fair  Recall:  Fair  Attention  Fair  Eye Contact:  Fair  Speech:  Clear and Coherent  Language:  Fair  Volume:  Normal  Mood: Euthymic  Affect:  Congruent  Thought Process:  Coherent  Thought Content:  Logical  Suicidal Thoughts:  No  Homicidal Thoughts:  No  Judgement:  chronically limited  Insight:  chronically limited  Psychomotor Activity:  Normal  Akathisia:  No  Fund of Knowledge:  Fair      Assets:  Manufacturing Systems Engineer  Desire for Improvement  Cognition:  WNL  ADL's:  Intact  AIMS (if indicated):        Other History   These have been pulled in through the EMR, reviewed, and updated if appropriate.  Family History:  The patient's family history includes Heart attack in his father and mother.  Medical History: Past Medical History:  Diagnosis Date   Alcohol abuse    Diverticulitis    Hypertension    Substance abuse (HCC)     Surgical History: Past Surgical History:  Procedure Laterality Date   ANKLE SURGERY     CHEST TUBE INSERTION     LITHOTRIPSY     TIBIA FRACTURE SURGERY     TONSILLECTOMY AND ADENOIDECTOMY       Medications:   Current Facility-Administered Medications:    acetaminophen  (TYLENOL ) tablet 650 mg, 650 mg, Oral, Q6H PRN, 650 mg at 05/07/24 0056 **OR** acetaminophen  (TYLENOL ) suppository 650 mg, 650 mg, Rectal, Q6H PRN, Josette, Richard, MD   amLODipine  (NORVASC ) tablet 5 mg, 5 mg, Oral, Daily, Hunter, Crystal L, PA-C, 5 mg at 05/10/24 0911   ARIPiprazole  (ABILIFY) tablet 5 mg, 5 mg, Oral, QHS, Wieting, Richard, MD, 5 mg at 05/09/24 2059   chlordiazePOXIDE  (LIBRIUM ) capsule 25 mg, 25 mg, Oral, TID, Josette, Richard, MD, 25 mg at 05/10/24 1540   ciprofloxacin  (CIPRO ) tablet 500 mg, 500 mg, Oral, BID, Zhang, Dekui, MD, 500 mg at 05/10/24 9088   doxycycline  (VIBRA -TABS) tablet 100 mg, 100 mg, Oral, Q12H, Zhang, Dekui, MD, 100 mg at 05/10/24 0910   FLUoxetine (PROZAC) capsule 20 mg, 20 mg, Oral, Daily, Jadapalle, Sree, MD, 20 mg at 05/10/24 0911   folic acid  (FOLVITE ) tablet 1 mg, 1 mg, Oral, Daily, Hunter, Crystal L, PA-C, 1 mg at 05/10/24 0911   gabapentin (NEURONTIN) capsule 300 mg, 300 mg, Oral, TID, Hunter, Crystal L, PA-C, 300 mg at 05/10/24 1540   ketorolac  (TORADOL ) 15 MG/ML injection 15 mg, 15 mg, Intravenous, Q6H PRN, Mansy, Jan A, MD, 15 mg at 05/10/24 1540   lidocaine  (LIDODERM ) 5 % 1 patch, 1 patch, Transdermal, Q24H, Zhang, Dekui, MD, 1 patch at 05/10/24 0911   melatonin tablet 5 mg, 5 mg, Oral, QHS, Zhang, Dekui, MD, 5 mg at 05/09/24 2059   metroNIDAZOLE  (FLAGYL ) tablet 500 mg, 500 mg, Oral, Q12H, Zhang, Dekui, MD, 500 mg at 05/10/24 0911   multivitamin with minerals tablet 1 tablet, 1 tablet, Oral, Daily, Hunter, Crystal L, PA-C, 1 tablet at 05/10/24 9088   nicotine  (NICODERM CQ  - dosed in mg/24 hours) patch 14 mg, 14 mg, Transdermal, Daily, Hunter, Crystal L, PA-C, 14 mg at 05/10/24 9078   ondansetron  (ZOFRAN ) tablet 4 mg, 4 mg, Oral, Q6H PRN **OR** ondansetron  (ZOFRAN ) injection 4 mg, 4 mg, Intravenous, Q6H PRN, Josette, Richard, MD   oxyCODONE  (Oxy IR/ROXICODONE ) immediate release tablet 5-10 mg, 5-10 mg, Oral, Q4H PRN, Zhang, Dekui, MD, 10 mg at 05/10/24 0919   pantoprazole  (PROTONIX ) EC tablet 40 mg, 40 mg, Oral, Daily, Hunter, Crystal L, PA-C, 40 mg at 05/10/24 0910   thiamine  (VITAMIN B1) tablet 100 mg, 100 mg, Oral, Daily, Hunter, Crystal L, PA-C, 100 mg at 05/10/24 0910   traZODone (DESYREL) tablet 100 mg, 100 mg, Oral, QHS PRN,  Smith, Annie B, NP, 100 mg at 05/09/24 2106  Allergies: Allergies  Allergen Reactions   Latex

## 2024-05-10 NOTE — TOC Progression Note (Signed)
 Transition of Care [facility]) - Progression Note    Patient Details  Name: Christian Dickerson MRN: [id number] Date of Birth: Jun 23, 1986  [facility]) CM/SW Contact  Corean ONEIDA Haddock, RN Phone Number: 05/10/2024, 12:25 PM  Clinical Narrative:     Patient still under IVC with plan for sigmoid colectomy on Thursday (11/06   Expected Discharge Plan:  (d/c plan pending patient hospital course, planning for surgery next week.) Barriers to Discharge: Continued Medical Work up               Expected Discharge Plan and Services   Discharge Planning Services: CM Consult   Living arrangements for the past 2 months: Homeless (patient verbalized he has been sleeping in the gazebo at the [facility] in [place])                                       Social Drivers of Health (SDOH) Interventions SDOH Screenings   Food Insecurity: Food Insecurity Present (05/03/2024)  Housing: High Risk (05/03/2024)  Transportation Needs: Patient Declined (05/03/2024)  Recent Concern: Transportation Needs - Unmet Transportation Needs (04/09/2024)  Utilities: Patient Declined (05/03/2024)  Alcohol Screen: High Risk (04/30/2024)  Social Connections: Socially Isolated (03/31/2024)  Tobacco Use: High Risk (05/07/2024)    Readmission Risk Interventions     No data to display

## 2024-05-10 NOTE — Plan of Care (Signed)

## 2024-05-10 NOTE — Plan of Care (Signed)
   Problem: Education: Goal: Knowledge of General Education information will improve Description Including pain rating scale, medication(s)/side effects and non-pharmacologic comfort measures Outcome: Progressing

## 2024-05-10 NOTE — Progress Notes (Signed)
 [facility] SURGICAL PROGRESS NOTE (cpt [phone])  Hospital Day(s): 7.   Interval History: Patient seen and examined. No acute issues overnight. He is doing well. Still with intermittent LLQ abdominal soreness. No fever, chills, nausea, emesis. No new labs this morning. He is on Cipro , Flagyl , and Doxycycline . Regular diet; no issues. He is having bowel function; no blood  Review of Systems:  Constitutional: denies fever, chills  HEENT: denies cough or congestion  Respiratory: denies any shortness of breath  Cardiovascular: denies chest pain or palpitations  Gastrointestinal: denies abdominal pain, N/V Genitourinary: denies burning with urination or urinary frequency Musculoskeletal: denies pain, decreased motor or sensation  Vital signs in last 24 hours: [min-max] current  Temp:  [97.6 F (36.4 C)-98.9 F (37.2 C)] 97.6 F (36.4 C) (11/04 0537) Pulse Rate:  [71-86] 77 (11/04 0537) Resp:  [17] 17 (11/04 0537) BP: (115-135)/(74-83) 122/83 (11/04 0537) SpO2:  [97 %-99 %] 99 % (11/04 0537)     Height: 6' 1 (185.4 cm) Weight: 106.6 kg BMI (Calculated): 31.01   Intake/Output last 2 shifts:  No intake/output data recorded.   Physical Exam:  Constitutional: alert, cooperative and no distress; NAD, safety sitter at bedside  HENT: normocephalic without obvious abnormality  Eyes: PERRL, EOM's grossly intact and symmetric  Respiratory: breathing non-labored at rest  Cardiovascular: regular rate and sinus rhythm  Gastrointestinal: soft, non-tender, and non-distended, no rebound/guarding    Labs:     Latest Ref Rng & Units 05/04/2024    5:15 AM 05/03/2024    6:27 AM 05/01/2024    8:48 PM  CBC  WBC 4.0 - 10.5 K/uL 5.6  6.1  7.8   Hemoglobin 13.0 - 17.0 g/dL 86.1  85.6  86.1   Hematocrit 39.0 - 52.0 % 40.8  41.8  40.2   Platelets 150 - 400 K/uL 150  163  180       Latest Ref Rng & Units 05/04/2024    5:15 AM 05/01/2024   10:22 AM 04/29/2024    9:35 PM  CMP   Glucose 70 - 99 mg/dL 92  897  92   BUN 6 - 20 mg/dL 9  9  10    Creatinine 0.61 - 1.24 mg/dL 9.31  9.41  9.46   Sodium 135 - 145 mmol/L 140  139  137   Potassium 3.5 - 5.1 mmol/L 4.2  4.3  3.0   Chloride 98 - 111 mmol/L 103  103  100   CO2 22 - 32 mmol/L 27  24  20    Calcium 8.9 - 10.3 mg/dL 8.8  9.3  8.6   Total Protein 6.5 - 8.1 g/dL 7.0  7.8  7.6   Total Bilirubin 0.0 - 1.2 mg/dL 0.5  0.9  0.7   Alkaline Phos 38 - 126 U/L 71  103  103   AST 15 - 41 U/L 30  39  31   ALT 0 - 44 U/L 33  35  32      Imaging studies: No new pertinent imaging studies   Assessment/Plan: 38 y.o. male with recurrent vs persistent diverticular abscess vs foci of extraluminal air, small and very stable, complicated by social barriers and psychiatric history.   - Again, plan for sigmoid colectomy on Thursday (11/06) with Dr Jordis pending OR/Anesthesia availability   - All risks, benefits, and alternatives to above procedure(s) were discussed with the patient, all of his questions were answered to his expressed satisfaction, patient expresses he wishes to proceed, and  informed consent was obtained.  - Will start bowel prep tomorrow  - Okay for regular diet today; Will transition to CLD tomorrow   - Continue Abx (Cipro /Flagyl ) - Monitor abdominal examination; on-going bowel function  - Pain control prn; antiemetics prn - Further management per primary service; we will follow   - Appreciate psychiatric continued evaluation; he is currently IVC   All of the above findings and recommendations were discussed with the patient, and the medical team, and all of patient's questions were answered to his expressed satisfaction.  -- Arthea Platt, PA-C  [facility] 05/10/2024, 7:19 AM M-F: 7am - 4pm

## 2024-05-11 DIAGNOSIS — K5720 Diverticulitis of large intestine with perforation and abscess without bleeding: Secondary | ICD-10-CM | POA: Diagnosis not present

## 2024-05-11 LAB — IRON AND TIBC
Iron: 95 ug/dL (ref 45–182)
Saturation Ratios: 23 % (ref 17.9–39.5)
TIBC: 420 ug/dL (ref 250–450)
UIBC: 325 ug/dL

## 2024-05-11 LAB — VITAMIN B12: Vitamin B-12: 249 pg/mL (ref 180–914)

## 2024-05-11 LAB — VITAMIN D 25 HYDROXY (VIT D DEFICIENCY, FRACTURES): Vit D, 25-Hydroxy: 32.1 ng/mL (ref 30–100)

## 2024-05-11 LAB — FOLATE: Folate: >20 ng/mL (ref >5.9)

## 2024-05-11 MED ORDER — SODIUM CHLORIDE 0.9 % IV SOLN
2.0000 g | Freq: Two times a day (BID) | INTRAVENOUS | Status: AC
  Administered 2024-05-12 (×2): 2 g via INTRAVENOUS
  Filled 2024-05-11: qty 2

## 2024-05-11 MED ORDER — CHLORHEXIDINE GLUCONATE CLOTH 2 % EX PADS
6.0000 | MEDICATED_PAD | Freq: Every day | CUTANEOUS | Status: DC
  Administered 2024-05-11 – 2024-05-12 (×2): 6 via TOPICAL

## 2024-05-11 MED ORDER — POLYETHYLENE GLYCOL 3350 17 GM/SCOOP PO POWD
119.0000 g | Freq: Once | ORAL | Status: AC
  Administered 2024-05-11: 119 g via ORAL
  Filled 2024-05-11: qty 119

## 2024-05-11 MED ORDER — ALVIMOPAN 12 MG PO CAPS
12.0000 mg | ORAL_CAPSULE | ORAL | Status: AC
  Administered 2024-05-12: 12 mg via ORAL
  Filled 2024-05-11: qty 1

## 2024-05-11 MED ORDER — NEOMYCIN SULFATE 500 MG PO TABS
1000.0000 mg | ORAL_TABLET | Freq: Three times a day (TID) | ORAL | Status: AC
  Administered 2024-05-11 (×3): 1000 mg via ORAL
  Filled 2024-05-11 (×3): qty 2

## 2024-05-11 NOTE — Consult Note (Addendum)
 WOC Nurse requested for preoperative stoma site marking  Discussed surgical procedure and possible stoma creation with patient.  Explained role of the WOC nurse team.  Provided the patient with educational booklet and provided samples of pouching options. Answered patient's questions.   Examined patient lying and sitting upright, in order to place the marking in the patient's visual field, away from any creases or abdominal contour issues and within the rectus muscle.  Attempted to mark below the patient's belt line.  There is a significant crease located lower on the abd when the patient leans forward which should be avoided if possible.   Marked for colostomy in the LLQ  __6__ cm to the left of the umbilicus and __7__cm below the umbilicus.  Marked for ileostomy in the RLQ  __6__cm to the right of the umbilicus and  __7__ cm below the umbilicus.  Patient's abdomen cleansed with CHG wipes at site markings, allowed to air dry prior to marking.Covered mark with thin film transparent dressing to preserve mark until date of surgery.   WOC Nurse team will follow up with patient after surgery for continued ostomy care and teaching if he receives an ostomy.   Please re-consult if further assistance is needed.  Thank-you,  Stephane Fought MSN, RN, CWOCN, CWCN-AP, CNS Contact Mon-Fri 0700-1500: [phone]

## 2024-05-11 NOTE — Progress Notes (Signed)
 Progress Note   Patient: Christian Dickerson FMW:[id number] DOB: 1986/01/16 DOA: 05/03/2024     8 DOS: the patient was seen and examined on 05/11/2024   Brief hospital course: ERICKSON YAMASHIRO is a 38 y.o. male with medical history significant of alcohol abuse, hypertension and recent hospitalization last month for perforated diverticulitis requiring antibiotics and drainage tube.  Patient came in and was admitted to the psychiatric unit on 1026 for alcohol abuse and suicide attempt.  Medical team was consulted for rectal bleeding.  Patient does have some left lower quadrant abdominal pain.  CT scan of the abdomen pelvis showed sigmoid colon diverticulitis with adjacent 1.9 x 1.9 x 2 point centimeter air-fluid collection concerning for abscess/contained perforation.  Patient was seen by general surgery, planning for colectomy on 11/6.  Patient was placed on antibiotics with meropenem  based on susceptibility.   Principal Problem:   Colonic diverticular abscess Active Problems:   Rectal bleeding   Alcoholism (HCC)   Suicidal ideation   Essential hypertension   Obesity (BMI 30-39.9)   Tobacco abuse   Bipolar affective disorder, currently depressed, moderate (HCC)   Assessment and Plan:  # Colonic diverticular abscess # Rectal bleeding. Likely this is persistent diverticular abscess that never improved.  Patient given a dose of Cipro  and Flagyl  this morning while on the psychiatric floor.  Patient currently treated with meropenem . Patient has been seen by general surgery, scheduled for colectomy on 11/6.  Will keep patient in the hospital before then. Rectal bleed appears to be secondary to diverticulosis with diverticulitis. Hemoglobin has been stable Continued IV meropenem ,  S/p doxycycline  for possible orchitis x 7 days Patient condition continue to improve, discontinued meropenem  on 11/2, started Cipro  and Flagyl . Condition still stable.  Surgery scheduled on 11/6   # Right varicocele and  bilateral hydrocele C/o testicular pain and swelling. US  scrotal rule out torsion, right varicocele and bilateral small hydrocele.  S/p doxycycline  for 7 days 11/5 d/w urology, recommended no intervention, it is most likely referred pain due to diverticulitis.  It will improve after surgery. No need to follow-up as an outpatient    # Alcoholism (HCC) Advised to quit, continue thiamine  and folic acid .  No evidence of alcohol withdrawal.   # Tobacco abuse: Nicotine  patch.  Smoking cessation counseling done     # Essential hypertension: Continue Norvasc    # Suicidal ideation With mood disorder.  Patient still under IVC on Abilify and Prozac Patient had increased agitation yesterday, seen by psychiatry, still required IVC.   Right ankle swelling and pain. From fall, initial CT scan did not show any fracture. Added Lidoderm  patch Continue cold compresses  Obesity (BMI 30-39.9) BMI 31.86   Subjective:  C/o pain in the right ankle area and also testicular pain. Denied any abdominal pain   Physical Exam: Vitals:   05/10/24 1523 05/10/24 1949 05/11/24 0802 05/11/24 1613  BP: 125/86 129/82 98/67 109/83  Pulse: 78 78 74 75  Resp: 18 18 14 15   Temp: (!) 97.5 F (36.4 C) 97.8 F (36.6 C)  97.6 F (36.4 C)  TempSrc:    Oral  SpO2: 100% 100% 97% 98%  Weight:      Height:       General exam: Appears calm and comfortable  Respiratory system: Clear to auscultation. Respiratory effort normal. Cardiovascular system: S1 & S2 heard, RRR. No JVD, murmurs, rubs, gallops or clicks. No pedal edema. Gastrointestinal system: Abdomen is nondistended, soft and nontender. No organomegaly or masses felt.  Normal bowel sounds heard. Central nervous system: Alert and oriented. No focal neurological deficits. Extremities: Symmetric 5 x 5 power. Skin: No rashes, lesions or ulcers Psychiatry: Judgement and insight appear normal. Mood & affect appropriate.    Data Reviewed:  There are no new  results to review at this time.  Family Communication: None  Disposition: Status is: Inpatient Remains inpatient appropriate because: Severity of disease, pending inpatient procedure     Time spent: 35 minutes  Author: Elvan Sor, MD 05/11/2024 4:48 PM  For on call review [email or web address].

## 2024-05-11 NOTE — Progress Notes (Signed)
 [facility] SURGICAL PROGRESS NOTE (cpt [phone])  Hospital Day(s): 8.   Interval History: Patient seen and examined. No acute issues overnight. He is doing well. Anxious about surgery tomorrow. No significant changes. No labs this AM. He is on Cipro , Flagyl , and Doxycycline . Regular diet; no issues. He is having bowel function; no blood  Review of Systems:  Constitutional: denies fever, chills  HEENT: denies cough or congestion  Respiratory: denies any shortness of breath  Cardiovascular: denies chest pain or palpitations  Gastrointestinal: denies abdominal pain, N/V Genitourinary: denies burning with urination or urinary frequency Musculoskeletal: denies pain, decreased motor or sensation  Vital signs in last 24 hours: [min-max] current  Temp:  [97.5 F (36.4 C)-97.8 F (36.6 C)] 97.8 F (36.6 C) (11/04 1949) Pulse Rate:  [76-78] 78 (11/04 1949) Resp:  [18] 18 (11/04 1949) BP: (124-129)/(82-86) 129/82 (11/04 1949) SpO2:  [98 %-100 %] 100 % (11/04 1949)     Height: 6' 1 (185.4 cm) Weight: 106.6 kg BMI (Calculated): 31.01   Intake/Output last 2 shifts:  No intake/output data recorded.   Physical Exam:  Constitutional: alert, cooperative and no distress; NAD, safety sitter at bedside  HENT: normocephalic without obvious abnormality  Eyes: PERRL, EOM's grossly intact and symmetric  Respiratory: breathing non-labored at rest  Cardiovascular: regular rate and sinus rhythm  Gastrointestinal: soft, non-tender, and non-distended, no rebound/guarding    Labs:     Latest Ref Rng & Units 05/04/2024    5:15 AM 05/03/2024    6:27 AM 05/01/2024    8:48 PM  CBC  WBC 4.0 - 10.5 K/uL 5.6  6.1  7.8   Hemoglobin 13.0 - 17.0 g/dL 86.1  85.6  86.1   Hematocrit 39.0 - 52.0 % 40.8  41.8  40.2   Platelets 150 - 400 K/uL 150  163  180       Latest Ref Rng & Units 05/04/2024    5:15 AM 05/01/2024   10:22 AM 04/29/2024    9:35 PM  CMP  Glucose 70 - 99 mg/dL 92  897  92    BUN 6 - 20 mg/dL 9  9  10    Creatinine 0.61 - 1.24 mg/dL 9.31  9.41  9.46   Sodium 135 - 145 mmol/L 140  139  137   Potassium 3.5 - 5.1 mmol/L 4.2  4.3  3.0   Chloride 98 - 111 mmol/L 103  103  100   CO2 22 - 32 mmol/L 27  24  20    Calcium 8.9 - 10.3 mg/dL 8.8  9.3  8.6   Total Protein 6.5 - 8.1 g/dL 7.0  7.8  7.6   Total Bilirubin 0.0 - 1.2 mg/dL 0.5  0.9  0.7   Alkaline Phos 38 - 126 U/L 71  103  103   AST 15 - 41 U/L 30  39  31   ALT 0 - 44 U/L 33  35  32      Imaging studies: No new pertinent imaging studies   Assessment/Plan: 38 y.o. male with recurrent vs persistent diverticular abscess vs foci of extraluminal air, small and very stable, complicated by social barriers and psychiatric history.   - Again, plan for sigmoid colectomy on Thursday (11/06) with Dr Jordis pending OR/Anesthesia availability   - All risks, benefits, and alternatives to above procedure(s) were discussed with the patient, all of his questions were answered to his expressed satisfaction, patient expresses he wishes to proceed, and informed consent was obtained.  -  Will do bowel prep today; Added PO neomycin x3 doses as scheduled, Miralax  - CLD today; NPO at midnight   - Continue Abx (Cipro /Flagyl )  - Will consult WOC RN for ostomy marking if available - Monitor abdominal examination; on-going bowel function  - Pain control prn; antiemetics prn - Further management per primary service; we will follow   - Appreciate psychiatric continued evaluation; competent to make decisions/sign consent    All of the above findings and recommendations were discussed with the patient, and the medical team, and all of patient's questions were answered to his expressed satisfaction.  -- Arthea Platt, PA-C [facility] 05/11/2024, 7:28 AM M-F: 7am - 4pm

## 2024-05-11 NOTE — Plan of Care (Signed)
   Problem: Education: Goal: Knowledge of General Education information will improve Description Including pain rating scale, medication(s)/side effects and non-pharmacologic comfort measures Outcome: Progressing

## 2024-05-12 ENCOUNTER — Encounter: Payer: Self-pay | Admitting: Anesthesiology

## 2024-05-12 ENCOUNTER — Encounter: Payer: Self-pay | Admitting: Internal Medicine

## 2024-05-12 ENCOUNTER — Encounter
Admission: AD | Disposition: A | Discharge status: Home / Self Care | Payer: Self-pay | Source: Ambulatory Visit | Attending: Internal Medicine

## 2024-05-12 ENCOUNTER — Inpatient Hospital Stay: Payer: Self-pay | Admitting: Anesthesiology

## 2024-05-12 DIAGNOSIS — K5720 Diverticulitis of large intestine with perforation and abscess without bleeding: Secondary | ICD-10-CM | POA: Diagnosis not present

## 2024-05-12 LAB — CBC
HCT: 38 % — ABNORMAL LOW (ref 39.0–52.0)
Hemoglobin: 13 g/dL (ref 13.0–17.0)
MCH: 35.2 pg — ABNORMAL HIGH (ref 26.0–34.0)
MCHC: 34.2 g/dL (ref 30.0–36.0)
MCV: 103 fL — ABNORMAL HIGH (ref 80.0–100.0)
Platelets: 163 K/uL (ref 150–400)
RBC: 3.69 MIL/uL — ABNORMAL LOW (ref 4.22–5.81)
RDW: 13.3 % (ref 11.5–15.5)
WBC: 7.2 K/uL (ref 4.0–10.5)
nRBC: 0 % (ref 0.0–0.2)

## 2024-05-12 LAB — TYPE AND SCREEN
ABO/RH(D): O POS
Antibody Screen: NEGATIVE

## 2024-05-12 LAB — BASIC METABOLIC PANEL WITH GFR
Anion gap: 8 (ref 5–15)
BUN: 11 mg/dL (ref 6–20)
CO2: 25 mmol/L (ref 22–32)
Calcium: 9.1 mg/dL (ref 8.9–10.3)
Chloride: 105 mmol/L (ref 98–111)
Creatinine, Ser: 0.67 mg/dL (ref 0.61–1.24)
GFR, Estimated: >60 mL/min (ref >60)
Glucose, Bld: 100 mg/dL — ABNORMAL HIGH (ref 70–99)
Potassium: 4 mmol/L (ref 3.5–5.1)
Sodium: 138 mmol/L (ref 135–145)

## 2024-05-12 LAB — PHOSPHORUS: Phosphorus: 5.4 mg/dL — ABNORMAL HIGH (ref 2.5–4.6)

## 2024-05-12 LAB — MAGNESIUM: Magnesium: 2 mg/dL (ref 1.7–2.4)

## 2024-05-12 SURGERY — COLECTOMY, PARTIAL, ROBOT-ASSISTED, LAPAROSCOPIC
Anesthesia: General

## 2024-05-12 MED ORDER — HYDROMORPHONE HCL 1 MG/ML IJ SOLN
INTRAMUSCULAR | Status: AC
  Filled 2024-05-12: qty 1

## 2024-05-12 MED ORDER — KETOROLAC TROMETHAMINE 30 MG/ML IJ SOLN
30.0000 mg | Freq: Four times a day (QID) | INTRAMUSCULAR | Status: DC
Start: 2024-05-12 — End: 2024-05-16
  Administered 2024-05-12 – 2024-05-16 (×14): 30 mg via INTRAVENOUS
  Filled 2024-05-12 (×14): qty 1

## 2024-05-12 MED ORDER — ALVIMOPAN 12 MG PO CAPS
12.0000 mg | ORAL_CAPSULE | ORAL | Status: AC
  Filled 2024-05-12: qty 1

## 2024-05-12 MED ORDER — DEXAMETHASONE SOD PHOSPHATE PF 10 MG/ML IJ SOLN
INTRAMUSCULAR | Status: DC | PRN
  Administered 2024-05-12: 10 mg via INTRAVENOUS

## 2024-05-12 MED ORDER — GLYCOPYRROLATE 0.2 MG/ML IJ SOLN
INTRAMUSCULAR | Status: DC | PRN
  Administered 2024-05-12: .2 mg via INTRAVENOUS

## 2024-05-12 MED ORDER — HYDROMORPHONE HCL 1 MG/ML IJ SOLN
0.5000 mg | INTRAMUSCULAR | Status: DC | PRN
  Administered 2024-05-12: 0.5 mg via INTRAVENOUS

## 2024-05-12 MED ORDER — VITAMIN D (ERGOCALCIFEROL) 1.25 MG (50000 UNIT) PO CAPS
50000.0000 [IU] | ORAL_CAPSULE | ORAL | Status: DC
  Filled 2024-05-12: qty 1

## 2024-05-12 MED ORDER — PHENYLEPHRINE 80 MCG/ML (10ML) SYRINGE FOR IV PUSH (FOR BLOOD PRESSURE SUPPORT)
PREFILLED_SYRINGE | INTRAVENOUS | Status: AC
  Filled 2024-05-12: qty 10

## 2024-05-12 MED ORDER — LIDOCAINE HCL (CARDIAC) PF 100 MG/5ML IV SOSY
PREFILLED_SYRINGE | INTRAVENOUS | Status: DC | PRN
  Administered 2024-05-12 (×2): 50 mg via INTRAVENOUS

## 2024-05-12 MED ORDER — ROCURONIUM BROMIDE 100 MG/10ML IV SOLN
INTRAVENOUS | Status: DC | PRN
  Administered 2024-05-12 (×2): 15 mg via INTRAVENOUS
  Administered 2024-05-12: 70 mg via INTRAVENOUS
  Administered 2024-05-12 (×3): 15 mg via INTRAVENOUS

## 2024-05-12 MED ORDER — KETOROLAC TROMETHAMINE 30 MG/ML IJ SOLN
INTRAMUSCULAR | Status: AC
  Filled 2024-05-12: qty 1

## 2024-05-12 MED ORDER — FENTANYL CITRATE (PF) 100 MCG/2ML IJ SOLN
INTRAMUSCULAR | Status: AC
  Filled 2024-05-12: qty 2

## 2024-05-12 MED ORDER — MIDAZOLAM HCL (PF) 2 MG/2ML IJ SOLN
INTRAMUSCULAR | Status: DC | PRN
  Administered 2024-05-12: 2 mg via INTRAVENOUS

## 2024-05-12 MED ORDER — MIDAZOLAM HCL 2 MG/2ML IJ SOLN
INTRAMUSCULAR | Status: AC
  Filled 2024-05-12: qty 2

## 2024-05-12 MED ORDER — SODIUM CHLORIDE (PF) 0.9 % IJ SOLN
INTRAMUSCULAR | Status: AC
Start: 2024-05-12 — End: 2024-05-12
  Filled 2024-05-12: qty 50

## 2024-05-12 MED ORDER — ROCURONIUM BROMIDE 10 MG/ML (PF) SYRINGE
PREFILLED_SYRINGE | INTRAVENOUS | Status: AC
Start: 2024-05-12 — End: 2024-05-12
  Filled 2024-05-12: qty 10

## 2024-05-12 MED ORDER — ROCURONIUM BROMIDE 10 MG/ML (PF) SYRINGE
PREFILLED_SYRINGE | INTRAVENOUS | Status: AC
  Filled 2024-05-12: qty 10

## 2024-05-12 MED ORDER — LACTATED RINGERS IV SOLN: INTRAVENOUS | Status: DC | PRN

## 2024-05-12 MED ORDER — KETAMINE HCL 50 MG/5ML IJ SOSY
PREFILLED_SYRINGE | INTRAMUSCULAR | Status: DC | PRN
  Administered 2024-05-12 (×5): 10 mg via INTRAVENOUS

## 2024-05-12 MED ORDER — SUGAMMADEX SODIUM 200 MG/2ML IV SOLN
INTRAVENOUS | Status: DC | PRN
  Administered 2024-05-12: 200 mg via INTRAVENOUS

## 2024-05-12 MED ORDER — FENTANYL CITRATE (PF) 100 MCG/2ML IJ SOLN
25.0000 ug | INTRAMUSCULAR | Status: DC | PRN
  Administered 2024-05-12 (×3): 50 ug via INTRAVENOUS

## 2024-05-12 MED ORDER — SODIUM CHLORIDE 0.9 % IV SOLN: INTRAVENOUS | Status: DC | PRN

## 2024-05-12 MED ORDER — ACETAMINOPHEN 500 MG PO TABS
1000.0000 mg | ORAL_TABLET | Freq: Four times a day (QID) | ORAL | Status: DC
  Administered 2024-05-12 – 2024-05-16 (×13): 1000 mg via ORAL
  Filled 2024-05-12 (×13): qty 2

## 2024-05-12 MED ORDER — BUPIVACAINE LIPOSOME 1.3 % IJ SUSP
INTRAMUSCULAR | Status: AC
Start: 2024-05-12 — End: 2024-05-12
  Filled 2024-05-12: qty 20

## 2024-05-12 MED ORDER — HYDROMORPHONE HCL 1 MG/ML IJ SOLN
INTRAMUSCULAR | Status: DC | PRN
  Administered 2024-05-12 (×2): .5 mg via INTRAVENOUS

## 2024-05-12 MED ORDER — EPHEDRINE 5 MG/ML INJ
INTRAVENOUS | Status: AC
  Filled 2024-05-12: qty 5

## 2024-05-12 MED ORDER — DROPERIDOL 2.5 MG/ML IJ SOLN
0.6250 mg | Freq: Once | INTRAMUSCULAR | Status: DC | PRN
Start: 2024-05-12 — End: 2024-05-12

## 2024-05-12 MED ORDER — BUPIVACAINE-EPINEPHRINE (PF) 0.25% -1:200000 IJ SOLN
INTRAMUSCULAR | Status: AC
  Filled 2024-05-12: qty 30

## 2024-05-12 MED ORDER — ACETAMINOPHEN 10 MG/ML IV SOLN
INTRAVENOUS | Status: AC
  Filled 2024-05-12: qty 100

## 2024-05-12 MED ORDER — VITAMIN B-12 1000 MCG PO TABS
1000.0000 ug | ORAL_TABLET | Freq: Every day | ORAL | Status: DC
  Filled 2024-05-12: qty 1

## 2024-05-12 MED ORDER — SODIUM CHLORIDE (PF) 0.9 % IJ SOLN
INTRAMUSCULAR | Status: DC | PRN
  Administered 2024-05-12: 100 mL via INTRAMUSCULAR

## 2024-05-12 MED ORDER — FENTANYL CITRATE (PF) 50 MCG/ML IJ SOSY
50.0000 ug | PREFILLED_SYRINGE | Freq: Once | INTRAMUSCULAR | Status: AC
  Administered 2024-05-12: 50 ug via INTRAVENOUS

## 2024-05-12 MED ORDER — DEXMEDETOMIDINE HCL IN NACL 80 MCG/20ML IV SOLN
INTRAVENOUS | Status: DC | PRN
  Administered 2024-05-12 (×3): 4 ug via INTRAVENOUS

## 2024-05-12 MED ORDER — HYDROMORPHONE HCL 1 MG/ML IJ SOLN
1.0000 mg | Freq: Once | INTRAMUSCULAR | Status: AC | PRN
  Administered 2024-05-12: 1 mg via INTRAVENOUS
  Filled 2024-05-12: qty 1

## 2024-05-12 MED ORDER — PHENYLEPHRINE 80 MCG/ML (10ML) SYRINGE FOR IV PUSH (FOR BLOOD PRESSURE SUPPORT)
PREFILLED_SYRINGE | INTRAVENOUS | Status: DC | PRN
  Administered 2024-05-12 (×2): 80 ug via INTRAVENOUS
  Administered 2024-05-12: 160 ug via INTRAVENOUS
  Administered 2024-05-12 (×3): 80 ug via INTRAVENOUS

## 2024-05-12 MED ORDER — EPHEDRINE SULFATE-NACL 50-0.9 MG/10ML-% IV SOSY
PREFILLED_SYRINGE | INTRAVENOUS | Status: DC | PRN
  Administered 2024-05-12 (×3): 5 mg via INTRAVENOUS

## 2024-05-12 MED ORDER — PROPOFOL 10 MG/ML IV BOLUS
INTRAVENOUS | Status: AC
  Filled 2024-05-12: qty 20

## 2024-05-12 MED ORDER — KETAMINE HCL 50 MG/5ML IJ SOSY
PREFILLED_SYRINGE | INTRAMUSCULAR | Status: AC
  Filled 2024-05-12: qty 5

## 2024-05-12 MED ORDER — KETOROLAC TROMETHAMINE 30 MG/ML IJ SOLN
INTRAMUSCULAR | Status: DC | PRN
  Administered 2024-05-12: 15 mg via INTRAVENOUS

## 2024-05-12 MED ORDER — ONDANSETRON HCL 4 MG/2ML IJ SOLN
INTRAMUSCULAR | Status: DC | PRN
  Administered 2024-05-12: 4 mg via INTRAVENOUS

## 2024-05-12 MED ORDER — ARTIFICIAL TEARS OPHTHALMIC OINT
TOPICAL_OINTMENT | OPHTHALMIC | Status: AC
  Filled 2024-05-12: qty 3.5

## 2024-05-12 MED ORDER — ONDANSETRON HCL 4 MG/2ML IJ SOLN
INTRAMUSCULAR | Status: AC
  Filled 2024-05-12: qty 2

## 2024-05-12 MED ORDER — PROPOFOL 10 MG/ML IV BOLUS
INTRAVENOUS | Status: DC | PRN
  Administered 2024-05-12: 200 mg via INTRAVENOUS

## 2024-05-12 MED ORDER — BOOST / RESOURCE BREEZE PO LIQD CUSTOM
1.0000 | Freq: Three times a day (TID) | ORAL | Status: DC
  Administered 2024-05-12: 1 via ORAL

## 2024-05-12 MED ORDER — LIDOCAINE HCL (PF) 2 % IJ SOLN
INTRAMUSCULAR | Status: AC
  Filled 2024-05-12: qty 5

## 2024-05-12 MED ORDER — CYANOCOBALAMIN 1000 MCG/ML IJ SOLN
1000.0000 ug | Freq: Every day | INTRAMUSCULAR | Status: DC
  Administered 2024-05-14 – 2024-05-16 (×2): 1000 ug via INTRAMUSCULAR
  Filled 2024-05-12 (×5): qty 1

## 2024-05-12 MED ORDER — FENTANYL CITRATE (PF) 250 MCG/5ML IJ SOLN
INTRAMUSCULAR | Status: AC
  Filled 2024-05-12: qty 5

## 2024-05-12 MED ORDER — GLYCOPYRROLATE 0.2 MG/ML IJ SOLN
INTRAMUSCULAR | Status: AC
  Filled 2024-05-12: qty 1

## 2024-05-12 MED ORDER — SODIUM CHLORIDE 0.9 % IV SOLN
2.0000 g | Freq: Two times a day (BID) | INTRAVENOUS | Status: AC
  Administered 2024-05-12 – 2024-05-13 (×2): 2 g via INTRAVENOUS
  Filled 2024-05-12 (×2): qty 2

## 2024-05-12 MED ORDER — ACETAMINOPHEN 10 MG/ML IV SOLN
INTRAVENOUS | Status: DC | PRN
  Administered 2024-05-12: 1000 mg via INTRAVENOUS

## 2024-05-12 MED ORDER — INDOCYANINE GREEN 25 MG IV SOLR
INTRAVENOUS | Status: DC | PRN
Start: 2024-05-12 — End: 2024-05-12
  Administered 2024-05-12: 5 mg via INTRAVENOUS

## 2024-05-12 MED ORDER — FENTANYL CITRATE (PF) 100 MCG/2ML IJ SOLN
INTRAMUSCULAR | Status: DC | PRN
  Administered 2024-05-12 (×4): 50 ug via INTRAVENOUS

## 2024-05-12 SURGICAL SUPPLY — 73 items
BAG LAPAROSCOPIC 12 15 PORT 16 (BASKET) IMPLANT
CANNULA REDUCER 12-8 DVNC XI (CANNULA) ×1 IMPLANT
CATH ROBINSON RED A/P 16FR (CATHETERS) ×1 IMPLANT
CLIP APPLIE XI LRG REUSE DVNC (INSTRUMENTS) IMPLANT
CLIP LIGATING HEM O LOK PURPLE (MISCELLANEOUS) IMPLANT
COVER MAYO STAND STRL (DRAPES) ×1 IMPLANT
COVER TIP SHEARS 8 DVNC (MISCELLANEOUS) ×1 IMPLANT
DEFOGGER SCOPE WARM SEASHARP (MISCELLANEOUS) ×1 IMPLANT
DERMABOND ADVANCED .7 DNX12 (GAUZE/BANDAGES/DRESSINGS) ×1 IMPLANT
DERMABOND ADVANCED .7 DNX6 (GAUZE/BANDAGES/DRESSINGS) IMPLANT
DRAIN CHANNEL JP 19F RND 3/16 (MISCELLANEOUS) IMPLANT
DRAPE ARM DVNC X/XI (DISPOSABLE) ×4 IMPLANT
DRAPE COLUMN DVNC XI (DISPOSABLE) ×1 IMPLANT
DRAPE LEGGINS SURG 28X43 STRL (DRAPES) ×1 IMPLANT
DRAPE UNDER BUTTOCK W/FLU (DRAPES) ×1 IMPLANT
ELECT BLADE 6.5 EXT (BLADE) ×1 IMPLANT
ELECTRODE REM PT RTRN 9FT ADLT (ELECTROSURGICAL) ×1 IMPLANT
FORCEPS BPLR R/ABLATION 8 DVNC (INSTRUMENTS) ×1 IMPLANT
GLOVE BIO SURGEON STRL SZ7 (GLOVE) ×3 IMPLANT
GOWN STRL REUS W/ TWL LRG LVL3 (GOWN DISPOSABLE) ×5 IMPLANT
GRASPER LAPSCPC 5X45 DSP (INSTRUMENTS) ×1 IMPLANT
GRASPER TIP-UP FEN DVNC XI (INSTRUMENTS) ×1 IMPLANT
HANDLE YANKAUER SUCT BULB TIP (MISCELLANEOUS) ×1 IMPLANT
IRRIGATION STRYKERFLOW (MISCELLANEOUS) ×1 IMPLANT
IV 0.9% NACL 1000 ML (IV SOLUTION) ×1 IMPLANT
KIT IMAGING PINPOINTPAQ (MISCELLANEOUS) ×1 IMPLANT
KIT PINK PAD W/HEAD ARM REST (MISCELLANEOUS) ×1 IMPLANT
LABEL OR SOLS (LABEL) ×1 IMPLANT
MANIFOLD NEPTUNE II (INSTRUMENTS) ×1 IMPLANT
NDL DRIVE SUT CUT DVNC (INSTRUMENTS) ×1 IMPLANT
NDL HYPO 22X1.5 SAFETY MO (MISCELLANEOUS) ×1 IMPLANT
NEEDLE DRIVE SUT CUT DVNC (INSTRUMENTS) ×1 IMPLANT
NEEDLE HYPO 22X1.5 SAFETY MO (MISCELLANEOUS) ×1 IMPLANT
NS IRRIG 500ML POUR BTL (IV SOLUTION) ×1 IMPLANT
OBTURATOR OPTICALSTD 8 DVNC (TROCAR) ×1 IMPLANT
PACK COLON CLEAN CLOSURE (MISCELLANEOUS) ×1 IMPLANT
PACK LAP CHOLECYSTECTOMY (MISCELLANEOUS) ×1 IMPLANT
PAD PREP OB/GYN DISP 24X41 (PERSONAL CARE ITEMS) ×1 IMPLANT
PORT ACCESS TROCAR AIRSEAL 5 (TROCAR) ×1 IMPLANT
RELOAD STAPLE 45 3.5 BLU DVNC (STAPLE) IMPLANT
RELOAD STAPLE 60 2.5 WHT DVNC (STAPLE) IMPLANT
RELOAD STAPLE 60 3.5 BLU DVNC (STAPLE) IMPLANT
SCISSORS MNPLR CVD DVNC XI (INSTRUMENTS) ×1 IMPLANT
SEAL UNIV 5-12 XI (MISCELLANEOUS) ×3 IMPLANT
SEALER VESSEL EXT DVNC XI (MISCELLANEOUS) ×1 IMPLANT
SET TRI-LUMEN FLTR TB AIRSEAL (TUBING) ×1 IMPLANT
SOLUTION ELECTROSURG ANTI STCK (MISCELLANEOUS) ×1 IMPLANT
SOLUTION PREP PVP 2OZ (MISCELLANEOUS) ×1 IMPLANT
SPIKE FLUID TRANSFER (MISCELLANEOUS) ×1 IMPLANT
SPONGE T-LAP 18X18 ~~LOC~~+RFID (SPONGE) ×3 IMPLANT
SPONGE T-LAP 4X18 ~~LOC~~+RFID (SPONGE) IMPLANT
STAPLER 45 SUREFORM DVNC (STAPLE) IMPLANT
STAPLER 60 SUREFORM DVNC (STAPLE) IMPLANT
STAPLER CIRCULAR MANUAL XL 25 (STAPLE) IMPLANT
STAPLER CIRCULAR MANUAL XL 29 (STAPLE) IMPLANT
STAPLER CIRCULAR MANUAL XL 33 (STAPLE) IMPLANT
SURGILUBE 2OZ TUBE FLIPTOP (MISCELLANEOUS) ×1 IMPLANT
SUT MNCRL AB 4-0 PS2 18 (SUTURE) ×1 IMPLANT
SUT PDS 2-0 27IN (SUTURE) ×1 IMPLANT
SUT PDS AB 0 CT1 27 (SUTURE) ×2 IMPLANT
SUT PROLENE 2 0 SH DA (SUTURE) ×1 IMPLANT
SUT SILK 2 0 SH (SUTURE) ×1 IMPLANT
SUT SILK 2 0SH CR/8 30 (SUTURE) IMPLANT
SUT SILK 2-0 30XBRD TIE 12 (SUTURE) ×1 IMPLANT
SUT STRATA 2-0 23CM CT-2 (SUTURE) IMPLANT
SUT VIC AB 2-0 SH 27XBRD (SUTURE) ×1 IMPLANT
SUT VICRYL 0 UR6 27IN ABS (SUTURE) ×1 IMPLANT
SYR 20ML LL LF (SYRINGE) ×2 IMPLANT
SYRINGE TOOMEY IRRIG 70ML (MISCELLANEOUS) ×1 IMPLANT
SYSTEM TROCR 1.5-3 SLV ABD GEL (ENDOMECHANICALS) ×1 IMPLANT
TRAP FLUID SMOKE EVACUATOR (MISCELLANEOUS) ×1 IMPLANT
TRAY FOLEY SLVR 16FR LF STAT (SET/KITS/TRAYS/PACK) ×1 IMPLANT
WATER STERILE IRR 500ML POUR (IV SOLUTION) ×1 IMPLANT

## 2024-05-12 NOTE — Progress Notes (Signed)
 Preoperative Review   Patient is met in the preoperative holding area. The history is reviewed in the chart and with the patient. I personally reviewed the options and rationale as well as the risks of this procedure that have been previously discussed with the patient. All questions asked by the patient and/or family were answered to their satisfaction.  Patient agrees to proceed with this procedure at this time.  Laneta Luna M.D. FACS

## 2024-05-12 NOTE — Anesthesia Procedure Notes (Signed)
 Procedure Name: Intubation Date/Time: 05/12/2024 7:49 AM  Performed by: Trudy Rankin LABOR, CRNAPre-anesthesia Checklist: Patient identified, Patient being monitored, Timeout performed, Emergency Drugs available and Suction available Patient Re-evaluated:Patient Re-evaluated prior to induction Oxygen Delivery Method: Circle System Utilized Preoxygenation: Pre-oxygenation with 100% oxygen Induction Type: IV induction and Rapid sequence Laryngoscope Size: Mac, 3 and McGrath Grade View: Grade I Tube type: Oral Tube size: 8.0 mm Number of attempts: 1 Airway Equipment and Method: Stylet Placement Confirmation: ETT inserted through vocal cords under direct vision, positive ETCO2 and breath sounds checked- equal and bilateral Secured at: 22 cm Tube secured with: Tape Dental Injury: Teeth and Oropharynx as per pre-operative assessment

## 2024-05-12 NOTE — Transfer of Care (Signed)
 Immediate Anesthesia Transfer of Care Note  Patient: Christian Dickerson  Procedure(s) Performed: COLECTOMY, PARTIAL, ROBOT-ASSISTED, LAPAROSCOPIC  Patient Location: PACU  Anesthesia Type:General  Level of Consciousness: awake and drowsy  Airway & Oxygen Therapy: Patient Spontanous Breathing and Patient connected to face mask oxygen  Post-op Assessment: Report given to RN and Post -op Vital signs reviewed and stable  Post vital signs: Reviewed and stable  Last Vitals:  Vitals Value Taken Time  BP 136/95 05/12/24 12:30  Temp    Pulse 92 05/12/24 12:31  Resp 16 05/12/24 12:31  SpO2 100 % 05/12/24 12:31  Vitals shown include unfiled device data.  Last Pain:  Vitals:   05/12/24 0716  TempSrc: Temporal  PainSc: 0-No pain      Patients Stated Pain Goal: 4 (05/05/24 1531)  Complications: No notable events documented.

## 2024-05-12 NOTE — Progress Notes (Signed)
 Dialed multiple times to brother and girlfriend. Unfortunately I was not able to talk to either one of them and was not able to leave a VM

## 2024-05-12 NOTE — Op Note (Addendum)
 PROCEDURES: 1. Robotic assisted Laparoscopic Low anterior resection 2. Robotic Laparoscopic takedown of splenic flexure 3/ Perfusion check of Pedicle graft anastomosis using ICG   Pre-operative Diagnosis: Recurrent diverticulitis with abscess   Post-operative Diagnosis: same    Surgeon: Laneta FALCON Devynn Hessler    Assistants: Darlyn Platt PA-C. ( required for the EEA anastomosis and for exposure)   Anesthesia: General endotracheal anesthesia   ASA Class: 2     Surgeon: Laneta Luna , MD FACS   Anesthesia: Gen. with endotracheal tube     Findings: Diverticular abscess with active diverticulitis and Phlegmon consistent with active infection present at time of surgery Purulent fluid from pelvic abscess Adhesions from the sigmoid to the pelvic wall and the omentum Tension free anastomosis with very good perfusion and negative intraoperative leak,  Two good intact donuts were visualized     Estimated Blood Loss: 50cc                Specimens: colon       Complications: none         Condition: stable   Procedure Details  The patient was seen again in the Holding Room. The benefits, complications, treatment options, and expected outcomes were discussed with the patient. The risks of bleeding, infection, recurrence of symptoms, failure to resolve symptoms, anastomotic leak, bowel injury, any of which could require further surgery were reviewed with the patient.   The patient was taken to Operating Room, identified  and the procedure verified.  A Time Out was held and the above information confirmed.   Prior to the induction of general anesthesia, antibiotic prophylaxis was administered. VTE prophylaxis was in place. General endotracheal anesthesia was then administered and tolerated well. After the induction, the abdomen was prepped with Chloraprep and draped in the sterile fashion. The patient was positioned in lithotomy position. 4.5 cm incision was created Right lower quadrant. The abdominal  cavity was entered under direct visualization and the Mini GelPort device was placed and pneumoperitoneum was obtained, no hemodynamic changes were apparent. . Three 8 mm robotic ports were placed under direct visualization and an additional 5 mm assist port was placed.   The Patient was positioned in steep trendelenburg and left side up. Robot was brought to the field and docked in the standard fashion. WE maintained visualization of our instruments at all times and avoided any collision between arms. I scrubbed out and went to the console. There were additional dense and extensive adhesions from sigmoid to the abdominal wall and pelvic wall that where lysed in the standard fashion with the scissors.  There were also some adhesions from the omentum to the sigmoid that were lysed using vessel sealer. Please note that the lysis of adhesions was labor-intensive and took a little about an extra hour of total operative time   We identified the takeoff of the inferior mesenteric artery dissected the pedicle and divided using extra long clips in the standard fashion Using the sealer we were able to divide the mesorectum and and also divided the mesentery of the descending colon we mobilized the descending colon IN a medial to lateral fashion. We preserved the ureter at all times. Upon dissecting the sigmoid from pelvic wall we drained purulent fluid and was promptly aspirated . Pelvic dissection was performed in a standard fashion, being in the areolar space anterior to the sacrum. THe mesorectum was divided with the vessel sealer. We dissected the rectum circumferentially and We divided at the mid rectum with standard 60  mm blue load   The white line of TOLDT was identified and divided and  We were also able to mobilize the splenic flexure using scisors in the standard fashion. The mesentery of the descending colon was divided and we divided the mid descending colon with a blue load 60mm stapler. We used ICG  green to make sure the distal descneding colon had good perfusion. We made sure we had adequate reach so the anastomosis could be performed tension free. The specimen was removed in a bag. The robot was undocked and  I scrubbed back in. Under direct visualization the descending colon stump was opened and measured the diameter of the bowel A 29 mm dilator was perfect size. A pursestring was used after inserting the anvil device. Mr [facility] was able to pass a 29 mm standard EEA stapler device through the anus and Under direct visualization we performed an end to end anastomosis with the EEA device. A leak test was performed inflating the colon with a Toomey syringe and a rubber catheter. showing no evidence of leak.  I did not feel the need for diverting ostomy as we felt good with the anastomosis and he had great perfusion to the anastomosis.  However I did decide to place a 19 Blake drain within the pelvis. We also observed 2 intact donuts   All the laparoscopic ports were removed and a second look showed no evidence of any bleeding or any other injuries.  Liposomal marcaine was infiltrated at all incision sites in a full thickness fashion.   We changed gloves and  close the  abdomen with two -0 PDS sutures in a running fashion to close the anterior and posterior fascia respectively. The skin incisions were closed with 4-0 Monocryl. Dermabond was used to coat all the skin incisions. Needle and laparotomy count were correct and there were no immediate complications.   Please note that this case was very difficult due to the extent of the adhesions, challenges with exposure in this patient with a high BMI requiring multiple advanced maneuvers, significant inflammatory disease and distortion of anatomy due to disease process. There was increased procedural services that were substantially greater than usual due to disease process, adhesions and body habitus   . Please also note that Mr. Darlyn Terryl, PA-C was essential due to the complexity of this case.  He helped with the dissection as well as the creation of the anastomosis and closure

## 2024-05-12 NOTE — Anesthesia Preprocedure Evaluation (Signed)
 Anesthesia Evaluation  Patient identified by MRN, date of birth, ID band Patient awake    Reviewed: Allergy & Precautions, H&P , NPO status , Patient's Chart, lab work & pertinent test results, reviewed documented beta blocker date and time   History of Anesthesia Complications Negative for: history of anesthetic complications  Airway Mallampati: III  TM Distance: >3 FB Neck ROM: full    Dental  (+) Dental Advidsory Given, Poor Dentition   Pulmonary neg shortness of breath, neg sleep apnea, neg COPD, neg recent URI, Current Smoker and Patient abstained from smoking.   Pulmonary exam normal breath sounds clear to auscultation       Cardiovascular Exercise Tolerance: Good hypertension, (-) angina (-) Past MI and (-) Cardiac Stents Normal cardiovascular exam(-) dysrhythmias (-) Valvular Problems/Murmurs Rhythm:regular Rate:Normal     Neuro/Psych  PSYCHIATRIC DISORDERS  Depression Bipolar Disorder   negative neurological ROS     GI/Hepatic Neg liver ROS,GERD  Controlled,,  Endo/Other  negative endocrine ROS    Renal/GU negative Renal ROS  negative genitourinary   Musculoskeletal   Abdominal   Peds  Hematology negative hematology ROS (+)   Anesthesia Other Findings Past Medical History: No date: Alcohol abuse No date: Diverticulitis No date: Hypertension No date: Substance abuse (HCC)   Reproductive/Obstetrics negative OB ROS                              Anesthesia Physical Anesthesia Plan  ASA: 2  Anesthesia Plan: General   Post-op Pain Management:    Induction: Intravenous  PONV Risk Score and Plan: 1 and Ondansetron , Dexamethasone, Midazolam  and Treatment may vary due to age or medical condition  Airway Management Planned: Oral ETT  Additional Equipment:   Intra-op Plan:   Post-operative Plan: Extubation in OR  Informed Consent: I have reviewed the patients History and  Physical, chart, labs and discussed the procedure including the risks, benefits and alternatives for the proposed anesthesia with the patient or authorized representative who has indicated his/her understanding and acceptance.     Dental Advisory Given  Plan Discussed with: Anesthesiologist, CRNA and Surgeon  Anesthesia Plan Comments:          Anesthesia Quick Evaluation

## 2024-05-12 NOTE — Progress Notes (Addendum)
 Progress Note   Patient: Christian Dickerson FMW:[id number] DOB: 1985/10/24 DOA: 05/03/2024     9 DOS: the patient was seen and examined on 05/12/2024   Brief hospital course: Christian Dickerson is a 38 y.o. male with medical history significant of alcohol abuse, hypertension and recent hospitalization last month for perforated diverticulitis requiring antibiotics and drainage tube.  Patient came in and was admitted to the psychiatric unit on 1026 for alcohol abuse and suicide attempt.  Medical team was consulted for rectal bleeding.  Patient does have some left lower quadrant abdominal pain.  CT scan of the abdomen pelvis showed sigmoid colon diverticulitis with adjacent 1.9 x 1.9 x 2 point centimeter air-fluid collection concerning for abscess/contained perforation.  Patient was seen by general surgery, planning for colectomy on 11/6.  Patient was placed on antibiotics with meropenem  based on susceptibility.   Principal Problem:   Colonic diverticular abscess Active Problems:   Rectal bleeding   Alcoholism (HCC)   Suicidal ideation   Essential hypertension   Obesity (BMI 30-39.9)   Tobacco abuse   Bipolar affective disorder, currently depressed, moderate (HCC)   Assessment and Plan:  # Colonic diverticular abscess # Rectal bleeding. Likely this is persistent diverticular abscess that never improved.  Patient given a dose of Cipro  and Flagyl  this morning while on the psychiatric floor.  Patient currently treated with meropenem . Patient has been seen by general surgery, scheduled for colectomy on 11/6.  Will keep patient in the hospital before then. Rectal bleed appears to be secondary to diverticulosis with diverticulitis. Hemoglobin has been stable Continued IV meropenem ,  S/p doxycycline  for possible orchitis x 7 days Patient condition continue to improve, discontinued meropenem  on 11/2, started Cipro  and Flagyl . 11/6 s/p Robotic assisted Laparoscopic Low anterior resection. Robotic  Laparoscopic takedown of splenic flexure. Perfusion check of Pedicle graft anastomosis using ICG    # Right varicocele and bilateral hydrocele C/o testicular pain and swelling. US  scrotal rule out torsion, right varicocele and bilateral small hydrocele.  S/p doxycycline  for 7 days 11/5 d/w urology, recommended no intervention, it is most likely referred pain due to diverticulitis.  It will improve after surgery. No need to follow-up as an outpatient    # Alcoholism (HCC) Advised to quit, continue thiamine  and folic acid .  No evidence of alcohol withdrawal.   # Tobacco abuse: Nicotine  patch.  Smoking cessation counseling done     # Essential hypertension: Continue Norvasc    # Suicidal ideation With mood disorder.  Patient still under IVC on Abilify and Prozac Patient had increased agitation yesterday, seen by psychiatry, still required IVC.   # Right ankle swelling and pain. From fall, initial CT scan did not show any fracture. Added Lidoderm  patch Continue cold compresses  # Vitamin B12 level 249, goal >400: Started vitamin B12 1000 mcg IM injection daily during hospital stay, followed by oral supplement.  Follow-up PCP to repeat vitamin B12 level after 3 to 6 months.  # Vitamin D level 32, started vitamin D 50,000 units p.o. weekly to prevent deficiency.  Follow with PCP for further management as an outpatient   Obesity (BMI 30-39.9) BMI 31.86   Subjective:  No significant events overnight.  Patient was seen after the procedure, and the postop area.  Patient was waking up, still not fully conscious.  He received pain medications so patient was dozing off.  Vital signs stable, patient was unable to offer any complaints at this time.    Physical Exam: Vitals:  05/11/24 1613 05/11/24 1912 05/12/24 0402 05/12/24 0716  BP: 109/83 120/87 109/74 108/82  Pulse: 75 71 61 66  Resp: 15 18 18 17   Temp: 97.6 F (36.4 C) 97.7 F (36.5 C) (!) 97.5 F (36.4 C) (!) 96.9 F (36.1  C)  TempSrc: Oral   Temporal  SpO2: 98% 100% 99% 97%  Weight:    111.1 kg  Height:    6' 1 (1.854 m)   General exam: Appears calm and comfortable  Respiratory system: Clear to auscultation. Respiratory effort normal. Cardiovascular system: S1 & S2 heard, RRR. No JVD, murmurs, rubs, gallops or clicks. No pedal edema. Gastrointestinal system: Abdomen is nondistended, soft and nontender. No organomegaly or masses felt. Normal bowel sounds heard. Central nervous system: Alert and oriented. No focal neurological deficits. Extremities: Symmetric 5 x 5 power. Skin: No rashes, lesions or ulcers Psychiatry: Judgement and insight appear normal. Mood & affect appropriate.    Data Reviewed:  There are no new results to review at this time.  Family Communication: None  Disposition: Status is: Inpatient Remains inpatient appropriate because: Severity of disease, pending inpatient procedure     Time spent: 35 minutes  Author: Elvan Sor, MD 05/12/2024 12:03 PM  For on call review [email or web address].

## 2024-05-12 NOTE — Consult Note (Signed)
 WOC Nurse ostomy follow up Surgical team is following for assessment and plan of care.  Pt did not receive an ostomy today during surgery.  No further role for WOC team. Please re-consult if further assistance is needed.  Thank-you,  Stephane Fought MSN, RN, CWOCN, CWCN-AP, CNS Contact Mon-Fri 0700-1500: [phone]

## 2024-05-13 DIAGNOSIS — K5720 Diverticulitis of large intestine with perforation and abscess without bleeding: Secondary | ICD-10-CM | POA: Diagnosis not present

## 2024-05-13 LAB — MAGNESIUM: Magnesium: 2.1 mg/dL (ref 1.7–2.4)

## 2024-05-13 LAB — BASIC METABOLIC PANEL WITH GFR
Anion gap: 9 (ref 5–15)
BUN: 12 mg/dL (ref 6–20)
CO2: 25 mmol/L (ref 22–32)
Calcium: 8.8 mg/dL — ABNORMAL LOW (ref 8.9–10.3)
Chloride: 106 mmol/L (ref 98–111)
Creatinine, Ser: 0.73 mg/dL (ref 0.61–1.24)
GFR, Estimated: >60 mL/min (ref >60)
Glucose, Bld: 103 mg/dL — ABNORMAL HIGH (ref 70–99)
Potassium: 4.1 mmol/L (ref 3.5–5.1)
Sodium: 140 mmol/L (ref 135–145)

## 2024-05-13 LAB — CBC
HCT: 37.1 % — ABNORMAL LOW (ref 39.0–52.0)
Hemoglobin: 12.9 g/dL — ABNORMAL LOW (ref 13.0–17.0)
MCH: 35.3 pg — ABNORMAL HIGH (ref 26.0–34.0)
MCHC: 34.8 g/dL (ref 30.0–36.0)
MCV: 101.6 fL — ABNORMAL HIGH (ref 80.0–100.0)
Platelets: 175 K/uL (ref 150–400)
RBC: 3.65 MIL/uL — ABNORMAL LOW (ref 4.22–5.81)
RDW: 13.5 % (ref 11.5–15.5)
WBC: 10.3 K/uL (ref 4.0–10.5)
nRBC: 0 % (ref 0.0–0.2)

## 2024-05-13 LAB — PHOSPHORUS: Phosphorus: 5.4 mg/dL — ABNORMAL HIGH (ref 2.5–4.6)

## 2024-05-13 MED ORDER — ENSURE PLUS HIGH PROTEIN PO LIQD: 237.0000 mL | Freq: Three times a day (TID) | ORAL | Status: DC

## 2024-05-13 MED ORDER — HYDROMORPHONE HCL 1 MG/ML IJ SOLN
1.0000 mg | INTRAMUSCULAR | Status: DC | PRN
  Administered 2024-05-13: 1 mg via INTRAVENOUS
  Filled 2024-05-13: qty 1

## 2024-05-13 NOTE — Progress Notes (Signed)
 POD # 1 Doing great considering Ambulating Drain serous Taking CLD  PE NAD Abd : soft, no rebound incision c/d/I, drain serous  A/ p Doing very well, advacne fulls Mobilize No complications

## 2024-05-13 NOTE — Plan of Care (Signed)

## 2024-05-13 NOTE — Progress Notes (Signed)
 Progress Note   Patient: Christian Dickerson FMW:[id number] DOB: [birth year] DOA: 05/03/2024     10 DOS: the patient was seen and examined on 05/13/2024   Brief hospital course: MATS JEANLOUIS is a 38 y.o. male with medical history significant of alcohol abuse, hypertension and recent hospitalization last month for perforated diverticulitis requiring antibiotics and drainage tube.  Patient came in and was admitted to the psychiatric unit on 1026 for alcohol abuse and suicide attempt.  Medical team was consulted for rectal bleeding.  Patient does have some left lower quadrant abdominal pain.  CT scan of the abdomen pelvis showed sigmoid colon diverticulitis with adjacent 1.9 x 1.9 x 2 point centimeter air-fluid collection concerning for abscess/contained perforation.  Patient was seen by general surgery, planning for colectomy on 11/6.  Patient was placed on antibiotics with meropenem  based on susceptibility.   Principal Problem:   Colonic diverticular abscess Active Problems:   Rectal bleeding   Alcoholism (HCC)   Suicidal ideation   Essential hypertension   Obesity (BMI 30-39.9)   Tobacco abuse   Bipolar affective disorder, currently depressed, moderate (HCC)   Assessment and Plan:  # Colonic diverticular abscess # Rectal bleeding. Likely this is persistent diverticular abscess that never improved.  Patient given a dose of Cipro  and Flagyl  this morning while on the psychiatric floor.  Patient currently treated with meropenem . Patient has been seen by general surgery, scheduled for colectomy on 11/6.  Will keep patient in the hospital before then. Rectal bleed appears to be secondary to diverticulosis with diverticulitis. Hemoglobin has been stable Continued IV meropenem ,  S/p doxycycline  for possible orchitis x 7 days Patient condition continue to improve, discontinued meropenem  on 11/2, started Cipro  and Flagyl . 11/6 s/p Robotic assisted Laparoscopic Low anterior resection. Robotic  Laparoscopic takedown of splenic flexure. Perfusion check of Pedicle graft anastomosis using ICG    # Right varicocele and bilateral hydrocele C/o testicular pain and swelling. US  scrotal rule out torsion, right varicocele and bilateral small hydrocele.  S/p doxycycline  for 7 days 11/5 d/w urology, recommended no intervention, it is most likely referred pain due to diverticulitis.  It will improve after surgery. No need to follow-up as an outpatient    # Alcoholism (HCC) Advised to quit, continue thiamine  and folic acid .  No evidence of alcohol withdrawal.   # Tobacco abuse: Nicotine  patch.  Smoking cessation counseling done     # Essential hypertension: Continue Norvasc    # Suicidal ideation With mood disorder.  Patient still under IVC on Abilify and Prozac Patient had increased agitation yesterday, seen by psychiatry, still required IVC.   # Right ankle swelling and pain. From fall, initial CT scan did not show any fracture. Added Lidoderm  patch Continue cold compresses  # Vitamin B12 level 249, goal >400: Started vitamin B12 1000 mcg IM injection daily during hospital stay, followed by oral supplement.  Follow-up PCP to repeat vitamin B12 level after 3 to 6 months.  # Vitamin D level 32, started vitamin D 50,000 units p.o. weekly to prevent deficiency.  Follow with PCP for further management as an outpatient   Obesity (BMI 30-39.9) BMI 31.86   Subjective:  No significant events overnight.  Patient was ambulating, having some soreness in the abdomen after surgery.  Tolerating clear liquid diet well.  Still has right-sided testicular pain, patient was explained that if this is referred pain, it will get better gradually Right ankle pain is better with the cold compresses.  Patient denied any other  complaints.    Physical Exam: Vitals:   05/12/24 1757 05/12/24 1902 05/13/24 0003 05/13/24 0625  BP: 139/84 134/79 114/77 115/84  Pulse: 98 (!) 104 86 73  Resp: 17 17 16 16    Temp: 97.9 F (36.6 C) 98.1 F (36.7 C) 98.3 F (36.8 C) 98 F (36.7 C)  TempSrc:  Oral Oral Oral  SpO2: 97% 96% 96% 96%  Weight:      Height:       General exam: Appears calm and comfortable  Respiratory system: Clear to auscultation. Respiratory effort normal. Cardiovascular system: S1 & S2 heard, RRR. No JVD, murmurs, rubs, gallops or clicks. No pedal edema. Gastrointestinal system: BS present, RLQ dressing CDI, drain serosanguineous intact.  Mild postop tenderness, obese Central nervous system: Alert and oriented. No focal neurological deficits. Extremities: No edema, right foot Cam boot intact Skin: No rashes, lesions or ulcers Psychiatry: Judgement and insight appear normal. Mood & affect appropriate.    Data Reviewed:  There are no new results to review at this time.  Family Communication: None  Disposition: Status is: Inpatient Remains inpatient appropriate because: Severity of disease, pending inpatient procedure     Time spent: 35 minutes  Author: Elvan Sor, MD 05/13/2024 3:38 PM  For on call review [email or web address].

## 2024-05-14 DIAGNOSIS — K5720 Diverticulitis of large intestine with perforation and abscess without bleeding: Secondary | ICD-10-CM | POA: Diagnosis not present

## 2024-05-14 LAB — CBC
HCT: 35.3 % — ABNORMAL LOW (ref 39.0–52.0)
Hemoglobin: 12 g/dL — ABNORMAL LOW (ref 13.0–17.0)
MCH: 34.8 pg — ABNORMAL HIGH (ref 26.0–34.0)
MCHC: 34 g/dL (ref 30.0–36.0)
MCV: 102.3 fL — ABNORMAL HIGH (ref 80.0–100.0)
Platelets: 156 K/uL (ref 150–400)
RBC: 3.45 MIL/uL — ABNORMAL LOW (ref 4.22–5.81)
RDW: 13.6 % (ref 11.5–15.5)
WBC: 7.6 K/uL (ref 4.0–10.5)
nRBC: 0 % (ref 0.0–0.2)

## 2024-05-14 LAB — URINALYSIS, COMPLETE (UACMP) WITH MICROSCOPIC
Bacteria, UA: NONE SEEN
Bilirubin Urine: NEGATIVE
Glucose, UA: NEGATIVE mg/dL
Hgb urine dipstick: NEGATIVE
Ketones, ur: 5 mg/dL — AB
Leukocytes,Ua: NEGATIVE
Nitrite: NEGATIVE
Protein, ur: NEGATIVE mg/dL
Specific Gravity, Urine: 1.032 — ABNORMAL HIGH (ref 1.005–1.030)
pH: 5 (ref 5.0–8.0)

## 2024-05-14 LAB — BASIC METABOLIC PANEL WITH GFR
Anion gap: 12 (ref 5–15)
BUN: 12 mg/dL (ref 6–20)
CO2: 24 mmol/L (ref 22–32)
Calcium: 8.9 mg/dL (ref 8.9–10.3)
Chloride: 103 mmol/L (ref 98–111)
Creatinine, Ser: 0.68 mg/dL (ref 0.61–1.24)
GFR, Estimated: >60 mL/min (ref >60)
Glucose, Bld: 100 mg/dL — ABNORMAL HIGH (ref 70–99)
Potassium: 3.8 mmol/L (ref 3.5–5.1)
Sodium: 139 mmol/L (ref 135–145)

## 2024-05-14 LAB — PHOSPHORUS: Phosphorus: 5.3 mg/dL — ABNORMAL HIGH (ref 2.5–4.6)

## 2024-05-14 LAB — MAGNESIUM: Magnesium: 2.1 mg/dL (ref 1.7–2.4)

## 2024-05-14 MED ORDER — HYDROMORPHONE HCL 1 MG/ML IJ SOLN
1.0000 mg | Freq: Three times a day (TID) | INTRAMUSCULAR | Status: DC | PRN
  Administered 2024-05-14 – 2024-05-16 (×3): 1 mg via INTRAVENOUS
  Filled 2024-05-14 (×3): qty 1

## 2024-05-14 NOTE — Progress Notes (Signed)
 POD # 2 avss Doing great considering Ambulating Drain serous Taking fulls   PE NAD Abd : soft, no rebound incision c/d/I, drain serous   A/ p Doing very well, soft diet No complications

## 2024-05-14 NOTE — Progress Notes (Signed)
 Progress Note   Patient: Christian Dickerson FMW:[id number] DOB: [birth year] DOA: 05/03/2024     11 DOS: the patient was seen and examined on 05/14/2024   Brief hospital course: KENECHUKWU ECKSTEIN is a 38 y.o. male with medical history significant of alcohol abuse, hypertension and recent hospitalization last month for perforated diverticulitis requiring antibiotics and drainage tube.  Patient came in and was admitted to the psychiatric unit on 1026 for alcohol abuse and suicide attempt.  Medical team was consulted for rectal bleeding.  Patient does have some left lower quadrant abdominal pain.  CT scan of the abdomen pelvis showed sigmoid colon diverticulitis with adjacent 1.9 x 1.9 x 2 point centimeter air-fluid collection concerning for abscess/contained perforation.  Patient was seen by general surgery, planning for colectomy on 11/6.  Patient was placed on antibiotics with meropenem  based on susceptibility.   Principal Problem:   Colonic diverticular abscess Active Problems:   Rectal bleeding   Alcoholism (HCC)   Suicidal ideation   Essential hypertension   Obesity (BMI 30-39.9)   Tobacco abuse   Bipolar affective disorder, currently depressed, moderate (HCC)   Assessment and Plan:  # Colonic diverticular abscess # Rectal bleeding. Likely this is persistent diverticular abscess that never improved.  Patient given a dose of Cipro  and Flagyl  this morning while on the psychiatric floor.  Patient currently treated with meropenem . Patient has been seen by general surgery, scheduled for colectomy on 11/6.  Will keep patient in the hospital before then. Rectal bleed appears to be secondary to diverticulosis with diverticulitis. Hemoglobin has been stable Continued IV meropenem ,  S/p doxycycline  for possible orchitis x 7 days Patient condition continue to improve, discontinued meropenem  on 11/2, started Cipro  and Flagyl . D/c'd Abx, no more need as per Sx 11/6 s/p Robotic assisted Laparoscopic Low  anterior resection. Robotic Laparoscopic takedown of splenic flexure. Perfusion check of Pedicle graft anastomosis using ICG    # Right varicocele and bilateral hydrocele C/o testicular pain and swelling. US  scrotal rule out torsion, right varicocele and bilateral small hydrocele.  S/p doxycycline  for 7 days 11/5 d/w urology, recommended no intervention, it is most likely referred pain due to diverticulitis.  It will improve after surgery. No need to follow-up as an outpatient 11/8 c/o dysuria, follow UA and urine culture and bladder scan    # Alcoholism: Advised to quit, continue thiamine  and folic acid .  No evidence of alcohol withdrawal.   # Tobacco abuse: Nicotine  patch.  Smoking cessation counseling done     # Essential hypertension: Continue Norvasc    # Suicidal ideation: With mood disorder.  Patient still under IVC on Abilify and Prozac Patient had increased agitation yesterday, seen by psychiatry, still required IVC.   # Right ankle swelling and pain: From fall, initial CT scan did not show any fracture. Added Lidoderm  patch Continue cold compresses  # Vitamin B12 level 249 at lowe End, goal >400: Started vitamin B12 1000 mcg IM injection daily during hospital stay, followed by oral supplement to prevent deficiency.  Follow-up PCP to repeat vitamin B12 level after 3 to 6 months.  # Vitamin D level 32 at lower End. started vitamin D 50,000 units p.o. weekly to prevent deficiency.  Follow with PCP for further management as an outpatient   Obesity (BMI 30-39.9) BMI 31.86   Subjective:  No significant events overnight except sharp pain in the right lower quadrant extending to right testicular area.  Patient required IV Dilaudid .  In the morning time patient was  feeling fine, he is not complaining of any pain while laying in the bed but complaining of pain when he sits, pain is radiating from the right lower quadrant of the right testicular area.  Otherwise no complaints.   Right ankle is doing fine, feeling better with the ice pack and cam boot.     Physical Exam: Vitals:   05/14/24 0428 05/14/24 0501 05/14/24 0839 05/14/24 1202  BP: 97/65 95/61 108/77 114/80  Pulse: 62  69 71  Resp: 18  19 20   Temp: 97.6 F (36.4 C)  97.6 F (36.4 C) 97.7 F (36.5 C)  TempSrc: Oral  Oral Oral  SpO2: 93%  100% 100%  Weight:  117.1 kg    Height:       General exam: Appears calm and comfortable  Respiratory system: Clear to auscultation. Respiratory effort normal. Cardiovascular system: S1 & S2 heard, RRR. No JVD, murmurs, rubs, gallops or clicks. No pedal edema. Gastrointestinal system: BS present, RLQ dressing CDI, drain serosanguineous intact.  Mild postop tenderness, obese Central nervous system: Alert and oriented. No focal neurological deficits. Extremities: No edema, right foot Cam boot intact Skin: No rashes, lesions or ulcers Psychiatry: Judgement and insight appear normal. Mood & affect appropriate.    Data Reviewed:  There are no new results to review at this time.  Family Communication: None  Disposition: Status is: Inpatient Remains inpatient appropriate because: Severity of disease, pending inpatient procedure     Time spent: 35 minutes  Author: Elvan Sor, MD 05/14/2024 3:28 PM  For on call review [email or web address]

## 2024-05-14 NOTE — Plan of Care (Signed)

## 2024-05-14 NOTE — Plan of Care (Signed)

## 2024-05-15 DIAGNOSIS — K5720 Diverticulitis of large intestine with perforation and abscess without bleeding: Secondary | ICD-10-CM | POA: Diagnosis not present

## 2024-05-15 LAB — CBC
HCT: 34.6 % — ABNORMAL LOW (ref 39.0–52.0)
Hemoglobin: 11.8 g/dL — ABNORMAL LOW (ref 13.0–17.0)
MCH: 34.9 pg — ABNORMAL HIGH (ref 26.0–34.0)
MCHC: 34.1 g/dL (ref 30.0–36.0)
MCV: 102.4 fL — ABNORMAL HIGH (ref 80.0–100.0)
Platelets: 154 K/uL (ref 150–400)
RBC: 3.38 MIL/uL — ABNORMAL LOW (ref 4.22–5.81)
RDW: 13.3 % (ref 11.5–15.5)
WBC: 7.7 K/uL (ref 4.0–10.5)
nRBC: 0 % (ref 0.0–0.2)

## 2024-05-15 LAB — BASIC METABOLIC PANEL WITH GFR
Anion gap: 12 (ref 5–15)
BUN: 16 mg/dL (ref 6–20)
CO2: 25 mmol/L (ref 22–32)
Calcium: 8.9 mg/dL (ref 8.9–10.3)
Chloride: 103 mmol/L (ref 98–111)
Creatinine, Ser: 0.73 mg/dL (ref 0.61–1.24)
GFR, Estimated: >60 mL/min (ref >60)
Glucose, Bld: 103 mg/dL — ABNORMAL HIGH (ref 70–99)
Potassium: 4.1 mmol/L (ref 3.5–5.1)
Sodium: 140 mmol/L (ref 135–145)

## 2024-05-15 NOTE — Consult Note (Incomplete)
 [facility] Consult Follow up  Patient Name: .Christian Dickerson  MRN: [id number]  DOB: Dec 18, 1985  Consult Order details:  Orders (From admission, onward)     Start     Ordered   05/03/24 1003  IP CONSULT TO PSYCHIATRY       Comments: DR Josette savoy  Ordering Provider: Josette Ade, MD  Provider:  (Not yet assigned)  Question Answer Comment  Location [facility]   Reason for Consult? suicidal ideation      05/03/24 1002             Mode of Visit: In person    Psychiatry Consult Evaluation  Service Date: May 15, 2024 LOS:  LOS: 12 days  Chief Complaint I may have to have surgery  Primary Psychiatric Diagnoses  Suicidal ideation alcoholism   Assessment  Christian Dickerson is a 38 y.o. male admitted: Medically  05/11/2024:   11/4: Seen for follow-up they are alert and oriented.  They are compliant with medical recommendations.  They deny SI, HI, and AVH.  They are linear logical and future oriented.  They continue to desire to seek treatment but note that it depends on whether or not they get an ostomy bag.  He appears to be cooperating with nursing and med staff today.   05/09/2024 Patient is agitated that his pain is not adequately managed, he understands that he needs the treatment and is agreeable with current treatment plan. Denies current SI/HI/Avh. He is compliant with psychiatric medications. Discussed that we will continue to follow. He noted some frustration about the sitter but discussed that is hospital policy.   05/07/2024: Patient is assessed again on the inpatient unit at the request of the patient request to discuss his medications. Patient has a sitter at bedside and requested for consideration to become voluntary. Patient denied suicidal thoughts, has been compliant with medications, and his treatment plan. He will require surgery on the his right leg and states that he understands the necessity of the surgery and  will be compliant with recommendations. Discussed conditions of voluntary status with patient and he was agreeable (he had in his belongings a taser, pepper spray, and fingernail clippers that he was agreeable to leave in the possession of security).  Update: During process of commitment status change, patient stated to this provider that he was not comfortable with the sitter being removed from his room. I don't trust my thoughts when I am alone. Discussed at length with patient that he had options to talk with staff in those moments and he continued to disagree that he did not trust his thoughts. He requested to leave IVC in place.  Update: RN contacted provider that patient became irrate when he was informed that his GF would not be allowed to bring outside food to the unit and would have limited visitation time. Slamming his fists on the nursing station and be very difficult to redirect. PRN medication for agitation ordered.  05/05/2024:Patient was transferred to the medical floor from inpatient psychiatric unit at [facility] due to findings on CT scan.  Patient was rounded on today by this provider.  Patient currently denied suicidal homicidal ideation.  He denied any auditory or visual hallucinations.  His main concern was potentially about having surgery and care plan once transferred to the medical floor.  Patient reported that treatment center of [place] told him that he would be accepted after he got his medical issues addressed.  At this time, patient  is currently being medically admitted for further workup of symptoms.  Psychiatry will continue to round on patient while on the medical floor. 05/05/2024: Patient denies SI/HI/plan and denies hallucinations.  Patient is maintaining safe behaviors on the unit and is willing to participate in medical care.  Patient is able to disc adjustment in his psychotropic medications and is aware that dosage optimization is being held in the context of his  diverticulitis to minimize any side of due to psychotropics. 05/04/2024: Patient was seen on rounds today by psychiatry.  Patient reported currently doing okay.  He did ask if gabapentin normally makes people sleepy, we discussed side effects of gabapentin.  Patient does not wish to adjust current medication dose but we discussed that if sleepiness continues we can always further titrate gabapentin down.  Patient verbalized understanding at this time.  Patient denied suicidal or homicidal thoughts.  He denied auditory or visual hallucinations as well.  He reported that medical team plans to do surgery next Thursday and that he would be staying in the hospital until then.  He did report poor sleep last night and reported that he only received 50 mg of trazodone.  He asked for medication to be adjusted back to his normal 100 mg dose nightly.  Psychiatry made these medication changes at this time.  Patient was alert and oriented x 4 and responding to questions appropriately.  Patient was currently displaying logical coherent thought process.  Patient did not appear to be responding to internal stimuli and did not appear to be manic at this time.  Psychiatry will continue to round on patient while on the medical floor.  Diagnoses:  Active Hospital problems: Principal Problem:   Diverticulitis of large intestine with abscess without bleeding Active Problems:   Alcoholism (HCC)   Suicidal ideation   Rectal bleeding   Essential hypertension   Obesity (BMI 30-39.9)   Tobacco abuse   Bipolar affective disorder, currently depressed, moderate (HCC)    Plan   ## Psychiatric Medication Recommendations:  Continue current medications from inpatient unit -Increased trazodone to 100 mg nightly PRN  ## Medical Decision Making Capacity: Not specifically addressed in this encounter     ## Disposition:--Psychiatry will continue to round on patient while on the medical floor  ## Behavioral / Environmental:  - No specific recommendations at this time.     ## Safety and Observation Level:  - Based on my clinical evaluation, I estimate the patient to be at moderate risk of self harm in the current setting. - At this time, we recommend  1:1 Observation. This decision is based on my review of the chart including patient's history and current presentation, interview of the patient, mental status examination, and consideration of suicide risk including evaluating suicidal ideation, plan, intent, suicidal or self-harm behaviors, risk factors, and protective factors. This judgment is based on our ability to directly address suicide risk, implement suicide prevention strategies, and develop a safety plan while the patient is in the clinical setting. Please contact our team if there is a concern that risk level has changed.  CSSR Risk Category:C-SSRS RISK CATEGORY: Moderate Risk  Suicide Risk Assessment: Patient has following modifiable risk factors for suicide: recklessness, which we are addressing by psychiatry continuing to round on patient while being medically admitted. Patient has following non-modifiable or demographic risk factors for suicide: male gender and psychiatric hospitalization Patient has the following protective factors against suicide: Access to outpatient mental health care  Thank you for this consult  request. Recommendations have been communicated to the primary team.  [name]          History of Present Illness  Relevant Aspects of Hospital Hospital   Patient Report: Obtained from initial H&P on inpatient psych unit Patient is a 38 year old male with a past psychiatric history of alcohol use disorder who presented to the [facility] ED complaining of suicide attempt by taking Twenty Nine 5 mg BP pills with two 40 oz beers. Throughout evaluation in ED, patient's report was noted to be inconsistent with stating he overdosed then denying he overdosed.   Patient was initially admitted to inpatient psychiatric unit for stabilization.  During the hospital stay he had blood in stool.  Hospitalist was consulted and GI consult was made.  CT abdomen showed diverticulitis and patient has been transferred to medical floor for further medical care.  Today on interview he is alert and oriented.  He feels his pain needs are being more appropriately managed.  He is optimistic for his procedure coming up on the 6.  He is linear logical and future oriented.  He denies SI, HI, and AVH.  He voices no concerns or complaints at this time.  He reports improved sleep, stable appetite and mood.  Psychiatric and Social History  Past Psychiatric History: Alcohol use disorder Psychiatric History:  Information collected from chart and patient   Prev Dx/Sx: Alcohol use Disorder Current Psych Provider: None Home Meds (current):  Previous Med Trials: Ritalin, Wellbutrin, lithium, Seroquel  Therapy: None   Prior Psych Hospitalization: 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, previous jail time for 10 years Living Situation: Homeless Spiritual Hx: Religious- suicide against beliefs Access to weapons/lethal means: Denies   Substance History Alcohol: Daily use for several years Type of alcohol liquor Last Drink 04/29/24 Number of drinks per day several History of alcohol withdrawal seizures: Yes History of DT's: Denies Tobacco: Cigarettes Illicit drugs: Marijuana Prescription drug abuse: Endorses previous abuse of prescription drugs (Ritalin, Wellbutrin, Zoloft, others) but not recent  Rehab hx: Denies  Exam Findings  Physical Exam: Deferred to medical team note reviewed Vital Signs:  Temp:  [97.5 F (36.4 C)-99 F (37.2 C)] 99 F (37.2 C)  (11/09 1532) Pulse Rate:  [64-80] 80 (11/09 1532) Resp:  [18] 18 (11/09 0717) BP: (115-128)/(70-83) 128/83 (11/09 1532) SpO2:  [98 %-99 %] 99 % (11/09 1532) Weight:  [119.4 kg] 119.4 kg (11/09 1015) Blood pressure 128/83, pulse 80, temperature 99 F (37.2 C), temperature source Oral, resp. rate 18, height 6' 1 (1.854 m), weight 119.4 kg, SpO2 99%. Body mass index is 34.73 kg/m.    Mental Status Exam: General Appearance: Casual  Orientation:  Full (Time, Place, and Person)  Memory:  Immediate;   Fair Recent;   Fair Remote;   Fair  Concentration:  Concentration: Fair and Attention Span: Fair  Recall:  Fair  Attention  Fair  Eye Contact:  Fair  Speech:  Clear and Coherent  Language:  Fair  Volume:  Normal  Mood: Euthymic  Affect:  Congruent  Thought Process:  Coherent  Thought Content:  Logical  Suicidal Thoughts:  No  Homicidal Thoughts:  No  Judgement:  chronically limited  Insight:  chronically limited  Psychomotor Activity:  Normal  Akathisia:  No  Fund of Knowledge:  Fair      Assets:  Manufacturing Systems Engineer Desire for Improvement  Cognition:  WNL  ADL's:  Intact  AIMS (if indicated):        Other History   These have been pulled in through the EMR, reviewed, and updated if appropriate.  Family History:  The patient's family history includes Heart attack in his father and mother.  Medical History: Past Medical History:  Diagnosis Date   Alcohol abuse    Diverticulitis    Hypertension    Substance abuse (HCC)     Surgical History: Past Surgical History:  Procedure Laterality Date   ANKLE SURGERY     CHEST TUBE INSERTION     LITHOTRIPSY     TIBIA FRACTURE SURGERY     TONSILLECTOMY AND ADENOIDECTOMY       Medications:   Current Facility-Administered Medications:    acetaminophen  (TYLENOL ) tablet 1,000 mg, 1,000 mg, Oral, Q6H, Pabon, Diego F, MD, 1,000 mg at 05/15/24 1438   amLODipine  (NORVASC ) tablet 5 mg, 5 mg, Oral, Daily, Pabon, Diego F, MD,  5 mg at 05/15/24 9192   ARIPiprazole (ABILIFY) tablet 5 mg, 5 mg, Oral, QHS, Pabon, Diego F, MD, 5 mg at 05/14/24 2223   chlordiazePOXIDE  (LIBRIUM ) capsule 25 mg, 25 mg, Oral, TID, Pabon, Diego F, MD, 25 mg at 05/15/24 1534   Chlorhexidine Gluconate Cloth 2 % PADS 6 each, 6 each, Topical, Q0600, Pabon, Diego F, MD, 6 each at 05/12/24 0610   cyanocobalamin (VITAMIN B12) injection 1,000 mcg, 1,000 mcg, Intramuscular, Daily, 1,000 mcg at 05/14/24 0856 **FOLLOWED BY** [START ON 05/19/2024] cyanocobalamin (VITAMIN B12) tablet 1,000 mcg, 1,000 mcg, Oral, Daily, Pabon, Diego F, MD   feeding supplement (ENSURE PLUS HIGH PROTEIN) liquid 237 mL, 237 mL, Oral, TID BM, Kumar, Dileep, MD   FLUoxetine (PROZAC) capsule 20 mg, 20 mg, Oral, Daily, Pabon, Diego F, MD, 20 mg at 05/15/24 9192   folic acid  (FOLVITE ) tablet 1 mg, 1 mg, Oral, Daily, Pabon, Diego F, MD, 1 mg at 05/15/24 0807   gabapentin (NEURONTIN) capsule 300 mg, 300 mg, Oral, TID, Pabon, Diego F, MD, 300 mg at 05/15/24 1534   HYDROmorphone  (DILAUDID ) injection 1 mg, 1 mg, Intravenous, Q8H PRN, Von Bellis, MD, 1 mg at 05/15/24 1534   ketorolac  (TORADOL ) 30 MG/ML injection 30 mg, 30 mg, Intravenous, Q6H, Pabon, Diego F, MD, 30 mg at 05/15/24 1730   lidocaine  (LIDODERM ) 5 % 1 patch, 1 patch, Transdermal, Q24H, Pabon, Diego F, MD, 1 patch at 05/11/24 0950   melatonin tablet 5 mg, 5 mg, Oral, QHS, Pabon, Diego F, MD, 5 mg at 05/14/24 2223   multivitamin with minerals tablet 1 tablet, 1 tablet, Oral, Daily, Pabon, Diego F, MD, 1 tablet at 05/15/24 9191   nicotine  (NICODERM CQ  - dosed in mg/24 hours) patch 14 mg, 14 mg, Transdermal, Daily, Pabon, Diego F, MD, 14 mg at 05/15/24 0809   ondansetron  (ZOFRAN ) tablet 4 mg, 4 mg, Oral, Q6H PRN **OR** ondansetron  (ZOFRAN ) injection 4 mg, 4 mg, Intravenous, Q6H PRN, Pabon, Diego F, MD   oxyCODONE  (Oxy IR/ROXICODONE ) immediate release tablet 5-10 mg, 5-10 mg, Oral, Q4H PRN, Pabon, Diego F, MD, 10 mg at 05/15/24 1437    pantoprazole  (PROTONIX ) EC tablet 40 mg, 40 mg, Oral, Daily, Pabon, Diego F, MD, 40 mg at 05/15/24 0807   thiamine  (VITAMIN B1) tablet 100 mg, 100 mg, Oral, Daily, Pabon, Diego F, MD, 100 mg at 05/15/24 [id number]  traZODone (DESYREL) tablet 100 mg, 100 mg, Oral, QHS PRN, Pabon, Diego F, MD, 100 mg at 05/14/24 2227   Vitamin D (Ergocalciferol) (DRISDOL) 1.25 MG (50000 UNIT) capsule 50,000 Units, 50,000 Units, Oral, Q7 days, Pabon, Laneta FALCON, MD  Allergies: Allergies  Allergen Reactions   Latex      Camelia Mountain, NP

## 2024-05-15 NOTE — Progress Notes (Signed)
 POD # 3 avss Doing great considering Ambulating Drain serous Taking soft no N/V   PE NAD Abd : soft, no rebound incision c/d/I, drain serous   A/ p Doing very well, soft diet Dc drain Can be DC from surgical perspective No further a/bs No complications We will be available and make f/u appt

## 2024-05-15 NOTE — Plan of Care (Signed)

## 2024-05-15 NOTE — Progress Notes (Signed)
 Progress Note   Patient: Christian Dickerson FMW:[id number] DOB: 1986-06-22 DOA: 05/03/2024     12 DOS: the patient was seen and examined on 05/15/2024   Brief hospital course: Christian Dickerson is a 38 y.o. male with medical history significant of alcohol abuse, hypertension and recent hospitalization last month for perforated diverticulitis requiring antibiotics and drainage tube.  Patient came in and was admitted to the psychiatric unit on 1026 for alcohol abuse and suicide attempt.  Medical team was consulted for rectal bleeding.  Patient does have some left lower quadrant abdominal pain.  CT scan of the abdomen pelvis showed sigmoid colon diverticulitis with adjacent 1.9 x 1.9 x 2 point centimeter air-fluid collection concerning for abscess/contained perforation.  Patient was seen by general surgery, planning for colectomy on 11/6.  Patient was placed on antibiotics with meropenem  based on susceptibility.   Principal Problem:   Colonic diverticular abscess Active Problems:   Rectal bleeding   Alcoholism (HCC)   Suicidal ideation   Essential hypertension   Obesity (BMI 30-39.9)   Tobacco abuse   Bipolar affective disorder, currently depressed, moderate (HCC)   Assessment and Plan:  # Colonic diverticular abscess # Rectal bleeding. Likely this is persistent diverticular abscess that never improved.  Patient given a dose of Cipro  and Flagyl  this morning while on the psychiatric floor.  S/p meropenem .  General surgery was consulted and patient was scheduled for surgery on 11/6 Rectal bleed appears to be secondary to diverticulosis with diverticulitis. Hemoglobin has been stable S/p IV meropenem  d/c'd on 11/2, and S/p doxycycline  x possible orchitis. Patient condition continue to improve 11/6 s/p Robotic assisted Laparoscopic Low anterior resection. Robotic Laparoscopic takedown of splenic flexure. Perfusion check of Pedicle graft anastomosis using ICG s/p Cipro  and Flagyl . D/c'd Abx, no more  need as per Sx    # Right varicocele and bilateral hydrocele C/o testicular pain and swelling. US  scrotal rule out torsion, right varicocele and bilateral small hydrocele.  S/p doxycycline  for 7 days 11/5 d/w urology, recommended no intervention, it is most likely referred pain due to diverticulitis.  It will improve after surgery. No need to follow-up as an outpatient 11/8 c/o dysuria, UA negative   # Alcoholism: Advised to quit, continue thiamine  and folic acid .  No evidence of alcohol withdrawal.   # Tobacco abuse: Nicotine  patch.  Smoking cessation counseling done     # Essential hypertension: Continue Norvasc    # Suicidal ideation: With mood disorder.  Patient still under IVC on Abilify and Prozac Patient had increased agitation yesterday, seen by psychiatry, still required IVC.   # Right ankle swelling and pain: From fall, initial CT scan did not show any fracture. Added Lidoderm  patch Continue cold compresses  # Vitamin B12 level 249 at lowe End, goal >400: Started vitamin B12 1000 mcg IM injection daily during hospital stay, followed by oral supplement to prevent deficiency.  Follow-up PCP to repeat vitamin B12 level after 3 to 6 months.  # Vitamin D level 32 at lower End. started vitamin D 50,000 units p.o. weekly to prevent deficiency.  Follow with PCP for further management as an outpatient   Obesity (BMI 30-39.9) BMI 31.86   Subjective:  No significant events overnight.  Patient still has pain in the right lower quadrant and radiating to the testicles but it is less severe as compared to before. Patient denied any other complaints.   Physical Exam: Vitals:   05/14/24 2017 05/15/24 0603 05/15/24 0717 05/15/24 1015  BP: 119/70  117/81 115/74   Pulse: 70 64 66   Resp: 18 18 18    Temp: 97.7 F (36.5 C) (!) 97.5 F (36.4 C) 97.9 F (36.6 C)   TempSrc: Oral Oral Oral   SpO2: 98% 98% 98%   Weight:    119.4 kg  Height:       General exam: Appears calm and  comfortable  Respiratory system: Clear to auscultation. Respiratory effort normal. Cardiovascular system: S1 & S2 heard, RRR. No JVD, murmurs, rubs, gallops or clicks. No pedal edema. Gastrointestinal system: BS present, RLQ dressing CDI, drain serosanguineous intact.  Mild postop tenderness, obese Central nervous system: Alert and oriented. No focal neurological deficits. Extremities: No edema, right foot Cam boot intact Skin: No rashes, lesions or ulcers Psychiatry: Judgement and insight appear normal. Mood & affect appropriate.    Data Reviewed:  There are no new results to review at this time.  Family Communication: None  Disposition: Status is: Inpatient Remains inpatient appropriate because: Severity of disease, pending inpatient procedure     Time spent: 35 minutes  Author: Elvan Sor, MD 05/15/2024 2:00 PM  For on call review [email or web address].

## 2024-05-15 NOTE — Plan of Care (Signed)

## 2024-05-16 ENCOUNTER — Other Ambulatory Visit: Payer: Self-pay

## 2024-05-16 DIAGNOSIS — K5732 Diverticulitis of large intestine without perforation or abscess without bleeding: Secondary | ICD-10-CM

## 2024-05-16 DIAGNOSIS — K5720 Diverticulitis of large intestine with perforation and abscess without bleeding: Secondary | ICD-10-CM | POA: Diagnosis not present

## 2024-05-16 LAB — CBC
HCT: 36.3 % — ABNORMAL LOW (ref 39.0–52.0)
Hemoglobin: 12.2 g/dL — ABNORMAL LOW (ref 13.0–17.0)
MCH: 34.9 pg — ABNORMAL HIGH (ref 26.0–34.0)
MCHC: 33.6 g/dL (ref 30.0–36.0)
MCV: 103.7 fL — ABNORMAL HIGH (ref 80.0–100.0)
Platelets: 160 K/uL (ref 150–400)
RBC: 3.5 MIL/uL — ABNORMAL LOW (ref 4.22–5.81)
RDW: 13.4 % (ref 11.5–15.5)
WBC: 6.3 K/uL (ref 4.0–10.5)
nRBC: 0 % (ref 0.0–0.2)

## 2024-05-16 LAB — BASIC METABOLIC PANEL WITH GFR
Anion gap: 10 (ref 5–15)
BUN: 14 mg/dL (ref 6–20)
CO2: 28 mmol/L (ref 22–32)
Calcium: 8.7 mg/dL — ABNORMAL LOW (ref 8.9–10.3)
Chloride: 103 mmol/L (ref 98–111)
Creatinine, Ser: 0.84 mg/dL (ref 0.61–1.24)
GFR, Estimated: >60 mL/min (ref >60)
Glucose, Bld: 113 mg/dL — ABNORMAL HIGH (ref 70–99)
Potassium: 3.7 mmol/L (ref 3.5–5.1)
Sodium: 141 mmol/L (ref 135–145)

## 2024-05-16 LAB — SURGICAL PATHOLOGY

## 2024-05-16 LAB — URINE CULTURE: Culture: NO GROWTH

## 2024-05-16 MED ORDER — TAMSULOSIN HCL 0.4 MG PO CAPS
0.4000 mg | ORAL_CAPSULE | Freq: Every day | ORAL | Status: DC
Start: 2024-05-16 — End: 2024-05-16
  Administered 2024-05-16: 0.4 mg via ORAL
  Filled 2024-05-16: qty 1

## 2024-05-16 MED ORDER — AMLODIPINE BESYLATE 5 MG PO TABS
5.0000 mg | ORAL_TABLET | Freq: Every day | ORAL | 0 refills | Status: AC
  Filled 2024-05-16: qty 30, 30d supply, fill #0

## 2024-05-16 MED ORDER — VITAMIN B-1 100 MG PO TABS
100.0000 mg | ORAL_TABLET | Freq: Every day | ORAL | 0 refills | Status: AC
Start: 2024-05-16 — End: ?
  Filled 2024-05-16: qty 30, 30d supply, fill #0

## 2024-05-16 MED ORDER — CHLORDIAZEPOXIDE HCL 25 MG PO CAPS
25.0000 mg | ORAL_CAPSULE | Freq: Every day | ORAL | 0 refills | Status: AC
Start: 2024-05-16 — End: ?
  Filled 2024-05-16: qty 30, 30d supply, fill #0

## 2024-05-16 MED ORDER — PANTOPRAZOLE SODIUM 40 MG PO TBEC
40.0000 mg | DELAYED_RELEASE_TABLET | Freq: Every day | ORAL | 0 refills | Status: AC
  Filled 2024-05-16: qty 30, 30d supply, fill #0

## 2024-05-16 MED ORDER — VITAMIN D (ERGOCALCIFEROL) 1.25 MG (50000 UNIT) PO CAPS
50000.0000 [IU] | ORAL_CAPSULE | ORAL | 0 refills | Status: AC
  Filled 2024-05-16: qty 12, 84d supply, fill #0

## 2024-05-16 MED ORDER — CYANOCOBALAMIN 1000 MCG PO TABS
1000.0000 ug | ORAL_TABLET | Freq: Every day | ORAL | 2 refills | Status: AC
  Filled 2024-05-16: qty 30, 30d supply, fill #0

## 2024-05-16 MED ORDER — ARIPIPRAZOLE 5 MG PO TABS
5.0000 mg | ORAL_TABLET | Freq: Every day | ORAL | 0 refills | Status: AC
Start: 2024-05-16 — End: ?
  Filled 2024-05-16: qty 30, 30d supply, fill #0

## 2024-05-16 MED ORDER — FLUOXETINE HCL 20 MG PO CAPS
20.0000 mg | ORAL_CAPSULE | Freq: Every day | ORAL | 0 refills | Status: AC
Start: 2024-05-16 — End: ?
  Filled 2024-05-16: qty 30, 30d supply, fill #0

## 2024-05-16 MED ORDER — GABAPENTIN 300 MG PO CAPS
300.0000 mg | ORAL_CAPSULE | Freq: Three times a day (TID) | ORAL | 0 refills | Status: AC
Start: 2024-05-16 — End: ?
  Filled 2024-05-16: qty 90, 30d supply, fill #0

## 2024-05-16 MED ORDER — TAMSULOSIN HCL 0.4 MG PO CAPS
0.4000 mg | ORAL_CAPSULE | Freq: Every day | ORAL | 0 refills | Status: AC
  Filled 2024-05-16: qty 30, 30d supply, fill #0

## 2024-05-16 MED ORDER — OXYCODONE HCL 5 MG PO TABS
5.0000 mg | ORAL_TABLET | Freq: Three times a day (TID) | ORAL | 0 refills | Status: AC | PRN
  Filled 2024-05-16: qty 15, 5d supply, fill #0

## 2024-05-16 MED ORDER — ACETAMINOPHEN 325 MG PO TABS: 650.0000 mg | ORAL_TABLET | Freq: Three times a day (TID) | ORAL | Status: AC | PRN

## 2024-05-16 NOTE — TOC Transition Note (Signed)
 Transition of Care [facility]) - Discharge Note   Patient Details  Name: TREVYON SWOR MRN: [id number] Date of Birth: 1986-05-18  [facility] CM/SW Contact:  Alfonso Rummer, LCSW Phone Number: 05/16/2024, 4:43 PM   Clinical Narrative:     Pt is discharged, provided with taxi voucher. LCSW A Analleli Gierke spk with pt brother and sister in social worker and confirmed Mr Schetter will go to [employer] via charter communications train. Mr Gertner was requested taxi voucher be arranged to take him to [employer] therefore he can retrieve his  belongings escorted by patent examiner. Mr Mecca reported this information with LCSW A,  Aida Lemaire and publishing copy. Pt's brother and sister in law will purchase [employer]tra[employer] via online and ensure Mr. Creech arrives in [place].     Barriers to Discharge: Continued Medical Work up   Patient Goals and CMS Choice            Discharge Placement                       Discharge Plan and Services Additional resources added to the After Visit Summary for     Discharge Planning Services: CM Consult                                 Social Drivers of Health (SDOH) Interventions SDOH Screenings   Food Insecurity: Food Insecurity Present (05/03/2024)  Housing: High Risk (05/03/2024)  Transportation Needs: Patient Declined (05/03/2024)  Recent Concern: Transportation Needs - Unmet Transportation Needs (04/09/2024)  Utilities: Patient Declined (05/03/2024)  Alcohol Screen: High Risk (04/30/2024)  Social Connections: Socially Isolated (03/31/2024)  Tobacco Use: High Risk (05/12/2024)     Readmission Risk Interventions     No data to display

## 2024-05-16 NOTE — Discharge Summary (Signed)
 [facility] Discharge Summary   Patient: Christian Dickerson FMW:[id number]  PCP: Pcp, No  Date of admission: 05/03/2024   Date of discharge:  05/16/2024     Discharge Diagnoses:  Principal Problem:   Diverticulitis of large intestine with abscess without bleeding Active Problems:   Rectal bleeding   Alcoholism (HCC)   Suicidal ideation   Essential hypertension   Obesity (BMI 30-39.9)   Tobacco abuse   Bipolar affective disorder, currently depressed, moderate (HCC)   Admitted From: Community, patient is homeless  Disposition: As per patient he is going to [place]  to his brother and sister.  Needed watcher for taxi to [facility] train station which was arranged by TOC. Patient was cleared by psych to discharge and follow-up as an outpatient.  Recommendations for Outpatient Follow-up:  Follow with PCP in 1 week Follow-up with general surgery if persistent abdominal pain or any other postop problems. Follow-up with psych in 1 week  Follow up LABS/TEST:     Follow-up Information     PCP Follow up in 1 week(s).                 Diet recommendation: Cardiac diet  Activity: The patient is advised to gradually reintroduce usual activities, as tolerated  Discharge Condition: stable  Code Status: Full code   History of present illness: As per the H and P dictated on admission.  Hospital Course:  Christian Dickerson is a 38 y.o. male with medical history significant of alcohol abuse, hypertension and recent hospitalization last month for perforated diverticulitis requiring antibiotics and drainage tube.  Patient came in and was admitted to the psychiatric unit on 1026 for alcohol abuse and suicide attempt.  Medical team was consulted for rectal bleeding.  Patient does have some left lower quadrant abdominal pain.  CT scan of the abdomen pelvis showed sigmoid colon diverticulitis with adjacent 1.9 x 1.9 x 2 point centimeter air-fluid collection concerning for abscess/contained  perforation.  Patient was seen by general surgery, planning for colectomy on 11/6.  Patient was placed on antibiotics with meropenem  based on susceptibility.   Assessment and Plan:   # Colonic diverticular abscess: Resolved s/p surgery # Rectal bleeding: Resolved Rectal bleed appears to be secondary to diverticulosis with diverticulitis. Hemoglobin has been stable. Likely this is persistent diverticular abscess that never improved.  Patient given a dose of Cipro  and Flagyl  while on the psychiatric floor.  S/p meropenem .  General surgery was consulted and patient was scheduled for surgery on 11/6, s/p Robotic assisted Laparoscopic Low anterior resection. Robotic Laparoscopic takedown of splenic flexure. Perfusion check of Pedicle graft anastomosis using ICG S/p IV meropenem  d/c'd on 11/2, s/p Cipro  and Flagyl . D/c'd Abx, no more need as per Sx. Pt is  S/p doxycycline  x possible orchitis. Patient condition continue to improve.  JP drain was removed by general surgery on 11/9, patient was cleared by general surgery to discharge and follow-up as an outpatient.     # Right varicocele and bilateral hydrocele C/o testicular pain and swelling. US  scrotal rule out torsion, right varicocele and bilateral small hydrocele. S/p doxycycline  for 7 days 11/5 d/w urology, recommended no intervention, it is most likely referred pain due to diverticulitis.  It will improve after surgery. No need to follow-up as an outpatient.  11/8 c/o dysuria, UA negative 11/10 c/o difficulty voiding, possible BPH, started on Flomax  0.4 mg p.o. daily.  Recommended to follow-up with PCP.  May need follow-up with urology as an outpatient if persistent  difficulty voiding.   # Alcoholism: Alcohol abstinence counseling done.  S/p thiamine  and folic acid .  No evidence of alcohol withdrawal.  Continue thiamine  100 mg p.o. daily for 30 days on discharge.   # Tobacco abuse: s/p Nicotine  patch.  Smoking cessation counseling done.  #  Essential hypertension: Continued Norvasc    # Suicidal ideation: With mood disorder. S/p IVC, which was expired and it was not renewed by psych.  Patient was on Abilify, Prozac and gabapentin during hospital stay which has been continued. Patient denies any SI/HI, cleared by psych to discharge and follow-up as an outpatient.   # Right ankle swelling and pain: From fall, initial CT scan did not show any fracture. S/p Lidoderm  patch pain did not improve so patient stopped using it  Pain and swelling improved after cold compresses, patient was advised to continue cam boot and continue cool compresses.    # Vitamin B12 level 249 at lowe End, goal >400: Started vitamin B12 1000 mcg IM injection daily during hospital stay, followed by oral supplement to prevent deficiency.  Follow-up PCP to repeat vitamin B12 level after 3 to 6 months.   # Vitamin D level 32 at lower End. started vitamin D 50,000 units p.o. weekly to prevent deficiency.  Follow with PCP for further management as an outpatient      Body mass index is 34.58 kg/m.  Nutrition Interventions:  Pain control  - [employer]  Controlled Substance Reporting System database could not be reviewed as website was not working. -Oxycodone  5 mg p.o. 3 times daily as needed prescribed for post-op pain control.  - Patient was instructed, not to drive, operate heavy machinery, perform activities at heights, swimming or participation in water activities or provide baby sitting services while on Pain, Sleep and Anxiety Medications; until his outpatient Physician has advised to do so again.  - Also recommended to not to take more than prescribed Pain, Sleep and Anxiety Medications.  Patient was ambulatory without any assistance.  On the day of the discharge the patient's vitals were stable, and no other acute medical condition were reported by patient. the patient was felt safe to be discharge, patient is homeless and stated that he will go to  [place]  to his brother and sisters, needed a voucher for taxi to go to [facility] train station which was arranged by TOC.   Consultants: General surgery, psychiatry Procedures: s/p Robotic assisted Laparoscopic Low anterior resection. Robotic Laparoscopic takedown of splenic flexure. Perfusion check of Pedicle graft anastomosis using ICG  Discharge Exam: General: Appear in no distress, Oral Mucosa Clear, moist. Cardiovascular: S1 and S2 Present, no Murmur, Respiratory: normal respiratory effort, Bilateral Air entry present and no Crackles, no wheezes Abdomen: Bowel Sound present, Soft and mild RLQ tenderness.  Extremities: no Pedal edema, no calf tenderness Neurology: alert and oriented to time, place, and person affect appropriate.  Filed Weights   05/14/24 0501 05/15/24 1015 05/16/24 0717  Weight: 117.1 kg 119.4 kg 118.9 kg   Vitals:   05/15/24 2000 05/16/24 0830  BP: 126/74 117/75  Pulse: 71 (!) 59  Resp: 16 17  Temp: 98.6 F (37 C) 98 F (36.7 C)  SpO2: 99% 99%    DISCHARGE MEDICATION: Allergies as of 05/16/2024       Reactions   Latex         Medication List     STOP taking these medications    CertaVite/Antioxidants Tabs   ciprofloxacin  500 MG tablet Commonly known as: CIPRO   folic acid  1 MG tablet Commonly known as: FOLVITE    metroNIDAZOLE  500 MG tablet Commonly known as: FLAGYL        TAKE these medications    acetaminophen  325 MG tablet Commonly known as: TYLENOL  Take 2 tablets (650 mg total) by mouth every 8 (eight) hours as needed for headache, fever or mild pain (pain score 1-3).   amLODipine  5 MG tablet Commonly known as: NORVASC  Take 1 tablet (5 mg total) by mouth daily.   ARIPiprazole 5 MG tablet Commonly known as: ABILIFY Take 1 tablet (5 mg total) by mouth at bedtime.   chlordiazePOXIDE  25 MG capsule Commonly known as: LIBRIUM  Take 1 capsule (25 mg total) by mouth daily.   cyanocobalamin 1000 MCG tablet Take 1 tablet  (1,000 mcg total) by mouth daily. Start taking on: May 19, 2024   FLUoxetine 20 MG capsule Commonly known as: PROZAC Take 1 capsule (20 mg total) by mouth daily. What changed:  medication strength how much to take   gabapentin 300 MG capsule Commonly known as: NEURONTIN Take 1 capsule (300 mg total) by mouth 3 (three) times daily.   oxyCODONE  5 MG immediate release tablet Commonly known as: Oxy IR/ROXICODONE  Take 1 tablet (5 mg total) by mouth every 8 (eight) hours as needed for up to 5 days for moderate pain (pain score 4-6) or severe pain (pain score 7-10).   pantoprazole  40 MG tablet Commonly known as: PROTONIX  Take 1 tablet (40 mg total) by mouth daily.   tamsulosin  0.4 MG Caps capsule Commonly known as: FLOMAX  Take 1 capsule (0.4 mg total) by mouth daily. Start taking on: May 17, 2024   thiamine  100 MG tablet Commonly known as: Vitamin B-1 Take 1 tablet (100 mg total) by mouth daily.   Vitamin D (Ergocalciferol) 1.25 MG (50000 UNIT) Caps capsule Commonly known as: DRISDOL Take 1 capsule (50,000 Units total) by mouth every 7 (seven) days.               Discharge Care Instructions  (From admission, onward)           Start     Ordered   05/16/24 0000  Discharge wound care:       Comments: As per general surgery   05/16/24 1345           Allergies  Allergen Reactions   Latex    Discharge Instructions     Call MD for:  difficulty breathing, headache or visual disturbances   Complete by: As directed    Call MD for:  extreme fatigue   Complete by: As directed    Call MD for:  persistant dizziness or light-headedness   Complete by: As directed    Call MD for:  persistant nausea and vomiting   Complete by: As directed    Call MD for:  redness, tenderness, or signs of infection (pain, swelling, redness, odor or green/yellow discharge around incision site)   Complete by: As directed    Call MD for:  severe uncontrolled pain   Complete  by: As directed    Call MD for:  temperature >100.4   Complete by: As directed    Diet - low sodium heart healthy   Complete by: As directed    Discharge instructions   Complete by: As directed    Follow with PCP in 1 week Follow-up with general surgery if persistent abdominal pain or any other postop problems. Follow-up with psych in 1 week   Discharge wound care:  Complete by: As directed    As per general surgery   Increase activity slowly   Complete by: As directed        The results of significant diagnostics from this hospitalization (including imaging, microbiology, ancillary and laboratory) are listed below for reference.    Significant Diagnostic Studies: US  SCROTUM W/DOPPLER Result Date: 05/04/2024 EXAM: ULTRASOUND SCROTUM/TESTICLES WITH DOPPLER FLOW EVALUATION 05/04/2024 05:17:34 PM TECHNIQUE: Duplex ultrasound using B-mode/gray scaled imaging, Doppler spectral analysis and color flow Doppler was obtained of the testicles. COMPARISON: CT abdomen and pelvis dated 09/03/2023. CLINICAL HISTORY: FINDINGS: RIGHT: MEASUREMENTS: Right testicle measures 4.5 x 2.2 x 3.3 cm. Calcified testicular appendage on the right measures 4.1 x 2.9 x 3.2 mm. GREY SCALE: The right testicle demonstrates normal homogeneous echotexture without focal lesion. No testicular microlithiasis. DOPPLER EVALUATION: There is normal arterial and venous Doppler flow within the testicle. VARICOCELE: Right varicocele present. SCROTAL SAC: Small right hydrocele is present containing internal echoes. EPIDIDYMIS: Right epididymal head simple cyst measures 3.2 x 2.4 x 3.3 mm. LEFT: MEASUREMENTS: Left testicle measures 4.2 x 2.4 x 3.2 cm. GREY SCALE: The left testicle demonstrates normal homogeneous echotexture without focal lesion. No testicular microlithiasis. DOPPLER EVALUATION: There is normal arterial and venous Doppler flow within the testicle. VARICOCELE: No scrotal varicocele. SCROTAL SAC: Small left hydrocele is  present containing internal echoes. EPIDIDYMIS: Left epididymal head simple cyst measures 8.5 x 3.9 x 6.1 mm. IMPRESSION: 1. No evidence of torsion or other acute abnormality. 2. Small bilateral hydroceles containing internal echoes. Correlate clinically for infection or trauma. 3. Right varicocele. Electronically signed by: Greig Pique MD 05/04/2024 06:04 PM EDT RP Workstation: [id number]   CT ABDOMEN PELVIS W CONTRAST Result Date: 05/02/2024 EXAM: CT ABDOMEN AND PELVIS WITH CONTRAST 05/02/2024 02:56:51 PM TECHNIQUE: CT of the abdomen and pelvis was performed with the administration of 100 mL of iohexol  (OMNIPAQUE ) 300 MG/ML solution. Multiplanar reformatted images are provided for review. Automated exposure control, iterative reconstruction, and/or weight-based adjustment of the mA/kV was utilized to reduce the radiation dose to as low as reasonably achievable. COMPARISON: CT abdomen and pelvis. CLINICAL HISTORY: LLQ abdominal pain. FINDINGS: LOWER CHEST: No acute abnormality. LIVER: A subcentimeter hypodensity in the right lobe of the liver which is too small to characterize, likely a cyst or hemangioma. GALLBLADDER AND BILE DUCTS: Gallbladder is unremarkable. No biliary ductal dilatation. SPLEEN: No acute abnormality. PANCREAS: No acute abnormality. ADRENAL GLANDS: No acute abnormality. KIDNEYS, URETERS AND BLADDER: No stones in the kidneys or ureters. No hydronephrosis. No perinephric or periureteral stranding. Urinary bladder is unremarkable. GI AND BOWEL: Stomach demonstrates no acute abnormality. Appendix appears normal. There is sigmoid colon diverticulosis. There is wall thickening of the sigmoid colon with trace surrounding inflammation. Adjacent and separate air-fluid collection is seen posterior to the sigmoid colon measuring 1.9 x 1.9 x 2.0 cm. This is similar in location and size when compared to prior. There is no bowel obstruction. PERITONEUM AND RETROPERITONEUM: No ascites. No free air. No  other gross free intraperitoneal air. VASCULATURE: Aorta is normal in caliber. LYMPH NODES: No lymphadenopathy. REPRODUCTIVE ORGANS: No acute abnormality. BONES AND SOFT TISSUES: No acute osseous abnormality. There is a small fat-containing right inguinal hernia. No focal soft tissue abnormality. IMPRESSION: 1. Sigmoid colon diverticulitis with adjacent 1.9 x 1.9 x 2.0 cm air-fluid collection concerning for abscess/ contained perforation. Fluid collection similar in size and location compared to prior study. 2. Sigmoid diverticulosis. Electronically signed by: Greig Pique MD 05/02/2024 09:30 PM EDT RP Workstation:  [id number]   CT ANKLE RIGHT W CONTRAST Result Date: 05/02/2024 CLINICAL DATA:  Right ankle pain and swelling after falling on Wednesday. EXAM: CT OF THE RIGHT ANKLE WITH CONTRAST TECHNIQUE: Multidetector CT imaging of the right ankle was performed following the standard protocol during bolus administration of intravenous contrast. RADIATION DOSE REDUCTION: This exam was performed according to the departmental dose-optimization program which includes automated exposure control, adjustment of the mA and/or kV according to patient size and/or use of iterative reconstruction technique. CONTRAST:  75mL OMNIPAQUE  IOHEXOL  300 MG/ML  SOLN COMPARISON:  Radiographs 04/29/2024 and 12/09/2016. FINDINGS: Bones/Joint/Cartilage Stable postsurgical changes from anterior tibiotalar plate and screw fixation. The hardware appears unchanged. There is a chronic fracture of one of the upper tibial screws. No evidence of hardware loosening or displacement. 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 There is also mild chronic deformity of the distal fibula. No evidence of acute fracture, dislocation or acute bone destruction. Mild subtalar and talonavicular degenerative changes. Ligaments Suboptimally assessed by CT. Muscles and Tendons As evaluated by CT, the ankle tendons appear  intact. Prominent spurring projecting posteriorly from the medial malleolus partially encases the posterior tibialis tendon. Soft tissues Moderate generalized subcutaneous edema surrounding the ankle with extension into dorsal aspect of the midfoot. No organized fluid collection, unexpected foreign body or soft tissue emphysema. IMPRESSION: 1. No acute osseous findings or evidence of acute bone destruction. 2. Stable postsurgical changes from anterior tibiotalar plate and screw fixation with chronic fracture of one of the upper tibial screws. 3. 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 4. Nonspecific, moderate generalized subcutaneous edema surrounding the ankle with extension into the dorsal aspect of the midfoot. No organized fluid collection, unexpected foreign body or soft tissue emphysema. Electronically Signed   By: Elsie Perone M.D.   On: 05/02/2024 11:39   DG Tibia/Fibula Right Result Date: 04/29/2024 EXAM: 4 VIEW(S) XRAY OF THE RIGHT TIBIA AND FIBULA 04/29/2024 09:35:00 PM COMPARISON: None available. CLINICAL HISTORY: pain and swelling after fall. Patient c/o pain and swelling to right lower leg post fall FINDINGS: BONES AND JOINTS: Proximally, there is no fracture or dislocation. Distal tibia and fibula described on dedicated ankle radiographs. No focal osseous lesion. SOFT TISSUES: The soft tissues are unremarkable. IMPRESSION: 1. No acute fracture or dislocation. 2. Distal tibial/fibula described on dedicated ankle radiographs. Electronically signed by: Pinkie Pebbles MD 04/29/2024 09:44 PM EDT RP Workstation: [id number]   DG Ankle Complete Right Result Date: 04/29/2024 EXAM: 3 or more VIEW(S) XRAY OF THE RIGHT ANKLE 04/29/2024 09:35:00 PM CLINICAL HISTORY: pain and swelling after fall. Patient c/o pain and swelling to right lower leg post fall COMPARISON: 12/09/2016. FINDINGS: BONES AND JOINTS: Stable tibiotalar fusion without acute  hardware complication, noting a stable fractured upper screw. Associated posttraumatic deformity involving the distal tibia. No acute fracture. No joint dislocation. SOFT TISSUES: The soft tissues are unremarkable. IMPRESSION: 1. No acute findings. 2. Stable postprocedural changes, as above. Electronically signed by: Pinkie Pebbles MD 04/29/2024 09:43 PM EDT RP Workstation: [id number]    Microbiology: Recent Results (from the past 240 hours)  Urine Culture     Status: None   Collection Time: 05/14/24  8:30 PM   Specimen: Urine, Clean Catch  Result Value Ref Range Status   Specimen Description   Final    URINE, CLEAN CATCH Performed at [facility], [address] [place], [place] [address]    Special Requests   Final  NONE Performed at [facility], [address], [place], [place] [address]    Culture   Final    NO GROWTH Performed at [facility], [address] [place], [place] [address]    Report Status 05/16/2024 FINAL  Final     Labs: CBC: Recent Labs  Lab 05/12/24 0422 05/13/24 0525 05/14/24 0514 05/15/24 0930 05/16/24 0611  WBC 7.2 10.3 7.6 7.7 6.3  HGB 13.0 12.9* 12.0* 11.8* 12.2*  HCT 38.0* 37.1* 35.3* 34.6* 36.3*  MCV 103.0* 101.6* 102.3* 102.4* 103.7*  PLT 163 175 156 154 160   Basic Metabolic Panel: Recent Labs  Lab 05/12/24 0422 05/13/24 0525 05/14/24 0514 05/15/24 0930 05/16/24 0611  NA 138 140 139 140 141  K 4.0 4.1 3.8 4.1 3.7  CL 105 106 103 103 103  CO2 25 25 24 25 28   GLUCOSE 100* 103* 100* 103* 113*  BUN 11 12 12 16 14   CREATININE 0.67 0.73 0.68 0.73 0.84  CALCIUM 9.1 8.8* 8.9 8.9 8.7*  MG 2.0 2.1 2.1  --   --   PHOS 5.4* 5.4* 5.3*  --   --    Liver Function Tests: No results for input(s): AST, ALT, ALKPHOS, BILITOT, PROT, ALBUMIN in the last 168 hours. No results for input(s): LIPASE, AMYLASE in the last 168 hours. No results for input(s): AMMONIA in the last 168 hours. Cardiac  Enzymes: No results for input(s): CKTOTAL, CKMB, CKMBINDEX, TROPONINI in the last 168 hours. BNP (last 3 results) Recent Labs    04/10/24 1300  BNP 12.2   CBG: No results for input(s): GLUCAP in the last 168 hours.  Time spent: 35 minutes  Signed:  Elvan Sor  [facility] 05/16/2024 1:49 PM

## 2024-05-16 NOTE — Progress Notes (Signed)
 Discharge instructions reviewed with the patient. Cab voucher given to the patient. Patient will be sent to discharge lounge to wait on the cab

## 2024-05-16 NOTE — Plan of Care (Signed)
  Problem: Education: Goal: Knowledge of General Education information will improve Description: Including pain rating scale, medication(s)/side effects and non-pharmacologic comfort measures 05/16/2024 0404 by Waddell Leigh BRAVO, RN Outcome: Progressing 05/16/2024 0402 by Waddell Leigh BRAVO, RN Outcome: Progressing   Problem: Health Behavior/Discharge Planning: Goal: Ability to manage health-related needs will improve 05/16/2024 0404 by Waddell Leigh BRAVO, RN Outcome: Progressing 05/16/2024 0402 by Waddell Leigh BRAVO, RN Outcome: Progressing   Problem: Clinical Measurements: Goal: Will remain free from infection 05/16/2024 0404 by Waddell Leigh BRAVO, RN Outcome: Progressing 05/16/2024 0402 by Waddell Leigh BRAVO, RN Outcome: Progressing

## 2024-05-16 NOTE — BH Assessment (Signed)
 IVC PAPERS  RESCINDED PER  [name] NP

## 2024-05-16 NOTE — Plan of Care (Signed)

## 2024-05-16 NOTE — Consult Note (Signed)
 [facility] Consult Follow up  Patient Name: .Christian Dickerson  MRN: [id number]  DOB: [birth year]  Consult Order details:  Orders (From admission, onward)     Start     Ordered   05/03/24 1003  IP CONSULT TO PSYCHIATRY       Comments: DR Josette savoy  Ordering Provider: Josette Ade, MD  Provider:  (Not yet assigned)  Question Answer Comment  Location [facility]   Reason for Consult? suicidal ideation      05/03/24 1002             Mode of Visit: In person    Psychiatry Consult Evaluation  Service Date: May 16, 2024 LOS:  LOS: 13 days  Chief Complaint I may have to have surgery  Primary Psychiatric Diagnoses  Suicidal ideation alcoholism   Assessment  Christian Dickerson is a 38 y.o. male admitted: Medically  05/16/2024: Patient seen today for follow-up.  Patient remains alert and oriented x 4.  Patient remains compliant with medical recommendations and scheduled medications.  Patient denies any current suicidal or homicidal ideations.  Patient denies any auditory visual hallucinations.  Patient's thought process remains linear, logical, and future oriented.  Patient continued to report desire to follow-up with residential treatment services of [place].  Patient reported that this facility told him he needed to be medically cleared before being accepted.  He did report desire to be discharged to his brother's house to be able to get his belongings settled before pursuing this.  Patient reported needing to get his belongings from his ex-girlfriend's house to his brother's house.On current presentation there was no evidence of psychosis or mania and patient did not appear to be responding to internal stimuli. At this time, patient does not appear to be a risk to self or others.While future psychiatric events cannot be accurately predicted, the patient does not currently require acute inpatient psychiatric care and does not currently meet  [facility]  involuntary commitment criteria.  At this time, patient is psychiatrically cleared.  Patient will plan to follow-up with the listed above resource after getting his belongings situation settled as stated above.  Case was discussed with supervising physician Dr. JINNY.  IVC papers have been rescinded.  11/4: Seen for follow-up they are alert and oriented.  They are compliant with medical recommendations.  They deny SI, HI, and AVH.  They are linear logical and future oriented.  They continue to desire to seek treatment but note that it depends on whether or not they get an ostomy bag.  He appears to be cooperating with nursing and med staff today.   05/09/2024 Patient is agitated that his pain is not adequately managed, he understands that he needs the treatment and is agreeable with current treatment plan. Denies current SI/HI/Avh. He is compliant with psychiatric medications. Discussed that we will continue to follow. He noted some frustration about the sitter but discussed that is hospital policy.   05/07/2024: Patient is assessed again on the inpatient unit at the request of the patient request to discuss his medications. Patient has a sitter at bedside and requested for consideration to become voluntary. Patient denied suicidal thoughts, has been compliant with medications, and his treatment plan. He will require surgery on the his right leg and states that he understands the necessity of the surgery and will be compliant with recommendations. Discussed conditions of voluntary status with patient and he was agreeable (he had in his belongings a taser, pepper spray,  and fingernail clippers that he was agreeable to leave in the possession of security).  Update: During process of commitment status change, patient stated to this provider that he was not comfortable with the sitter being removed from his room. I don't trust my thoughts when I am alone. Discussed at length with patient that he had  options to talk with staff in those moments and he continued to disagree that he did not trust his thoughts. He requested to leave IVC in place.  Update: RN contacted provider that patient became irrate when he was informed that his GF would not be allowed to bring outside food to the unit and would have limited visitation time. Slamming his fists on the nursing station and be very difficult to redirect. PRN medication for agitation ordered.  05/05/2024:Patient was transferred to the medical floor from inpatient psychiatric unit at [facility] due to findings on CT scan.  Patient was rounded on today by this provider.  Patient currently denied suicidal homicidal ideation.  He denied any auditory or visual hallucinations.  His main concern was potentially about having surgery and care plan once transferred to the medical floor.  Patient reported that treatment center of [place] told him that he would be accepted after he got his medical issues addressed.  At this time, patient is currently being medically admitted for further workup of symptoms.  Psychiatry will continue to round on patient while on the medical floor. 05/05/2024: Patient denies SI/HI/plan and denies hallucinations.  Patient is maintaining safe behaviors on the unit and is willing to participate in medical care.  Patient is able to disc adjustment in his psychotropic medications and is aware that dosage optimization is being held in the context of his diverticulitis to minimize any side of due to psychotropics. 05/04/2024: Patient was seen on rounds today by psychiatry.  Patient reported currently doing okay.  He did ask if gabapentin normally makes people sleepy, we discussed side effects of gabapentin.  Patient does not wish to adjust current medication dose but we discussed that if sleepiness continues we can always further titrate gabapentin down.  Patient verbalized understanding at this time.  Patient denied suicidal or homicidal thoughts.  He  denied auditory or visual hallucinations as well.  He reported that medical team plans to do surgery next Thursday and that he would be staying in the hospital until then.  He did report poor sleep last night and reported that he only received 50 mg of trazodone.  He asked for medication to be adjusted back to his normal 100 mg dose nightly.  Psychiatry made these medication changes at this time.  Patient was alert and oriented x 4 and responding to questions appropriately.  Patient was currently displaying logical coherent thought process.  Patient did not appear to be responding to internal stimuli and did not appear to be manic at this time.  Psychiatry will continue to round on patient while on the medical floor.  Diagnoses:  Active Hospital problems: Principal Problem:   Diverticulitis of large intestine with abscess without bleeding Active Problems:   Alcoholism (HCC)   Suicidal ideation   Rectal bleeding   Essential hypertension   Obesity (BMI 30-39.9)   Tobacco abuse   Bipolar affective disorder, currently depressed, moderate (HCC)    Plan   ## Psychiatric Medication Recommendations:  Continue current medication regimen  ## Medical Decision Making Capacity: Not specifically addressed in this encounter     ## Disposition:--Patient is psychiatrically cleared for discharge-plans to  follow-up with the above resources ## Behavioral / Environmental: - No specific recommendations at this time.     ## Safety and Observation Level:  - Based on my clinical evaluation, I estimate the patient to be at low risk of self harm in the current setting. - At this time, we recommend routine observation.  This decision is based on my review of the chart including patient's history and current presentation, interview of the patient, mental status examination, and consideration of suicide risk including evaluating suicidal ideation, plan, intent, suicidal or self-harm behaviors, risk factors, and  protective factors. This judgment is based on our ability to directly address suicide risk, implement suicide prevention strategies, and develop a safety plan while the patient is in the clinical setting. Please contact our team if there is a concern that risk level has changed.    Suicide Risk Assessment: Patient has following modifiable risk factors for suicide: recklessness, which we are addressing by utilizing therapeutic communication and getting social work involved for follow-up care postdischarge. Patient has following non-modifiable or demographic risk factors for suicide: male gender and psychiatric hospitalization Patient has the following protective factors against suicide: Access to outpatient mental health care  Thank you for this consult request. Recommendations have been communicated to the primary team.  We will sign off at this time.          History of Present Illness  Relevant Aspects of Hospital Hospital   Patient Report: Obtained from initial H&P on inpatient psych unit Patient is a 38 year old male with a past psychiatric history of alcohol use disorder who presented to the [facility] ED complaining of suicide attempt by taking Twenty Nine 5 mg BP pills with two 40 oz beers. Throughout evaluation in ED, patient's report was noted to be inconsistent with stating he overdosed then denying he overdosed.  Patient was initially admitted to inpatient psychiatric unit for stabilization.  During the hospital stay he had blood in stool.  Hospitalist was consulted and GI consult was made.  CT abdomen showed diverticulitis and patient has been transferred to medical floor for further medical care.  Today on interview he is alert and oriented.  He feels his pain needs are being more appropriately managed.  He is optimistic for his procedure coming up on the 6.  He is linear logical and future oriented.  He denies SI, HI, and AVH.  He voices no concerns or complaints at this time.  He reports  improved sleep, stable appetite and mood.  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, previous jail time for 10 years Living Situation: Homeless Spiritual Hx: Religious- suicide against beliefs Access to weapons/lethal means: Denies   Substance History Alcohol: Daily use for several years Type of alcohol liquor Last Drink 04/29/24 Number of drinks per day several History of alcohol withdrawal seizures: Yes History of DT's: Denies Tobacco: Cigarettes Illicit drugs: Marijuana Prescription drug abuse: Endorses previous abuse of prescription drugs (Ritalin, Wellbutrin, Zoloft, others) but not recent  Rehab hx: Denies  Exam Findings  Physical Exam: Deferred to medical team note reviewed Vital Signs:  Temp:  [98 F (36.7 C)-99 F (37.2 C)] 98.2 F (36.8 C) (11/10 1451) Pulse Rate:  [59-81] 81 (11/10 1451) Resp:  [16-17] 17 (11/10 1451) BP: (117-142)/(74-95) 142/95 (11/10 1451) SpO2:  [99 %-100 %] 100 % (11/10 1451) Weight:  [118.9 kg] 118.9 kg (11/10 0717) Blood pressure (!) 142/95, pulse 81, temperature 98.2 F (36.8 C), temperature source Oral, resp. rate 17, height 6' 1 (1.854 m), weight 118.9 kg, SpO2 100%. Body mass index is 34.58 kg/m.    Mental Status Exam: General Appearance: Casual  Orientation:  Full (Time, Place, and Person)  Memory:  Immediate;   Fair Recent;   Fair Remote;   Fair  Concentration:   Concentration: Fair and Attention Span: Fair  Recall:  Fair  Attention  Fair  Eye Contact:  Fair  Speech:  Clear and Coherent  Language:  Fair  Volume:  Normal  Mood: Euthymic  Affect:  Congruent  Thought Process:  Coherent  Thought Content:  Logical  Suicidal Thoughts:  No  Homicidal Thoughts:  No  Judgement:  chronically limited  Insight:  chronically limited  Psychomotor Activity:  Normal  Akathisia:  No  Fund of Knowledge:  Fair      Assets:  Manufacturing Systems Engineer Desire for Improvement  Cognition:  WNL  ADL's:  Intact  AIMS (if indicated):        Other History   These have been pulled in through the EMR, reviewed, and updated if appropriate.  Family History:  The patient's family history includes Heart attack in his father and mother.  Medical History: Past Medical History:  Diagnosis Date   Alcohol abuse    Diverticulitis    Hypertension    Substance abuse (HCC)     Surgical History: Past Surgical History:  Procedure Laterality Date   ANKLE SURGERY     CHEST TUBE INSERTION     LITHOTRIPSY     TIBIA FRACTURE SURGERY     TONSILLECTOMY AND ADENOIDECTOMY       Medications:   Current Facility-Administered Medications:    acetaminophen  (TYLENOL ) tablet 1,000 mg, 1,000 mg, Oral, Q6H, Pabon, Diego F, MD, 1,000 mg at 05/16/24 0847   amLODipine  (NORVASC ) tablet 5 mg, 5 mg, Oral, Daily, Pabon, Diego F, MD, 5 mg at 05/16/24 0846   ARIPiprazole (ABILIFY) tablet 5 mg, 5 mg, Oral, QHS, Pabon, Diego F, MD, 5 mg at 05/15/24 2148   chlordiazePOXIDE  (LIBRIUM ) capsule 25 mg, 25 mg, Oral, TID, Pabon, Diego F, MD, 25 mg at 05/16/24 0846   Chlorhexidine Gluconate Cloth 2 % PADS 6 each, 6 each, Topical, Q0600, Pabon, Diego F, MD, 6 each at 05/12/24 0610   cyanocobalamin (VITAMIN B12) injection 1,000 mcg, 1,000 mcg, Intramuscular, Daily, 1,000 mcg at 05/16/24 0854 **FOLLOWED BY** [START ON 05/19/2024] cyanocobalamin (VITAMIN B12) tablet 1,000 mcg, 1,000 mcg, Oral, Daily,  Pabon, Diego F, MD   feeding supplement (ENSURE PLUS HIGH PROTEIN) liquid 237 mL, 237 mL, Oral, TID BM, Von Bellis, MD   FLUoxetine (PROZAC) capsule 20 mg, 20 mg, Oral, Daily, Pabon, Diego F, MD, 20 mg at 05/16/24 0849   folic acid  (FOLVITE ) tablet 1 mg, 1 mg, Oral, Daily, Pabon, Diego F, MD, 1 mg at 05/16/24 0846   gabapentin (NEURONTIN) capsule 300 mg, 300 mg, Oral, TID, Pabon, Diego F, MD, 300 mg at 05/16/24 0847   HYDROmorphone  (DILAUDID ) injection 1 mg, 1 mg, Intravenous, Q8H PRN, Von Bellis, MD,  1 mg at 05/16/24 0026   ketorolac  (TORADOL ) 30 MG/ML injection 30 mg, 30 mg, Intravenous, Q6H, Pabon, Diego F, MD, 30 mg at 05/16/24 1157   lidocaine  (LIDODERM ) 5 % 1 patch, 1 patch, Transdermal, Q24H, Pabon, Diego F, MD, 1 patch at 05/11/24 0950   melatonin tablet 5 mg, 5 mg, Oral, QHS, Pabon, Diego F, MD, 5 mg at 05/15/24 2148   multivitamin with minerals tablet 1 tablet, 1 tablet, Oral, Daily, Pabon, Diego F, MD, 1 tablet at 05/16/24 0848   nicotine  (NICODERM CQ  - dosed in mg/24 hours) patch 14 mg, 14 mg, Transdermal, Daily, Pabon, Diego F, MD, 14 mg at 05/16/24 9147   ondansetron  (ZOFRAN ) tablet 4 mg, 4 mg, Oral, Q6H PRN **OR** ondansetron  (ZOFRAN ) injection 4 mg, 4 mg, Intravenous, Q6H PRN, Pabon, Diego F, MD   oxyCODONE  (Oxy IR/ROXICODONE ) immediate release tablet 5-10 mg, 5-10 mg, Oral, Q4H PRN, Pabon, Diego F, MD, 10 mg at 05/16/24 0847   pantoprazole  (PROTONIX ) EC tablet 40 mg, 40 mg, Oral, Daily, Pabon, Diego F, MD, 40 mg at 05/16/24 0846   tamsulosin  (FLOMAX ) capsule 0.4 mg, 0.4 mg, Oral, Daily, Von Bellis, MD, 0.4 mg at 05/16/24 1156   thiamine  (VITAMIN B1) tablet 100 mg, 100 mg, Oral, Daily, Pabon, Diego F, MD, 100 mg at 05/16/24 0846   traZODone (DESYREL) tablet 100 mg, 100 mg, Oral, QHS PRN, Pabon, Diego F, MD, 100 mg at 05/15/24 2148   Vitamin D (Ergocalciferol) (DRISDOL) 1.25 MG (50000 UNIT) capsule 50,000 Units, 50,000 Units, Oral, Q7 days, Pabon, Laneta FALCON,  MD  Allergies: Allergies  Allergen Reactions   Latex      [name], NP This note was created using Dragon dictation software. Please excuse any inadvertent transcription errors. Case was discussed with supervising physician Dr. Jadapalle who is agreeable with current plan.

## 2024-05-16 NOTE — BH Assessment (Signed)
 IVC  papers  rescinded  by  [name]  NP

## 2024-05-18 ENCOUNTER — Telehealth: Payer: Self-pay

## 2024-05-18 NOTE — Progress Notes (Signed)
 Complex Care Management Note Care Guide Note  05/18/2024 Name: Christian Dickerson MRN: [id number] DOB: December 05, 1985   Complex Care Management Outreach Attempts: An unsuccessful telephone outreach was attempted today to offer the patient information about available complex care management services.  Follow Up Plan:  Additional outreach attempts will be made to offer the patient complex care management information and services.   Encounter Outcome:  No Answer  Dreama [facility]  [facility], [facility] VBCI Assistant Direct Dial: [phone]  Fax: [phone]

## 2024-05-20 NOTE — Progress Notes (Signed)
 Complex Care Management Note Care Guide Note  05/20/2024 Name: Christian Dickerson MRN: [id number] DOB: January 26, 1986   Complex Care Management Outreach Attempts: A second unsuccessful outreach was attempted today to offer the patient with information about available complex care management services.  Follow Up Plan:  Additional outreach attempts will be made to offer the patient complex care management information and services.   Encounter Outcome:  No Answer  Dreama [facility]  [facility], [facility] VBCI Assistant Direct Dial: [phone]  Fax: [phone]

## 2024-05-23 NOTE — Progress Notes (Signed)
 Complex Care Management Note Care Guide Note  05/23/2024 Name: Christian Dickerson MRN: [id number] DOB: 06/20/1986   Complex Care Management Outreach Attempts: A third unsuccessful outreach was attempted today to offer the patient with information about available complex care management services.  Follow Up Plan:  No further outreach attempts will be made at this time. We have been unable to contact the patient to offer or enroll patient in complex care management services.  Encounter Outcome:  No Answer  Dreama [facility]  [facility], [facility] VBCI Assistant Direct Dial: [phone]  Fax: [phone]

## 2024-05-25 ENCOUNTER — Emergency Department
Admission: EM | Admit: 2024-05-25 | Discharge: 2024-05-26 | Disposition: A | Discharge status: Home / Self Care | Attending: Emergency Medicine | Admitting: Emergency Medicine

## 2024-05-25 ENCOUNTER — Other Ambulatory Visit: Payer: Self-pay

## 2024-05-25 DIAGNOSIS — Y906 Blood alcohol level of 120-199 mg/100 ml: Secondary | ICD-10-CM | POA: Insufficient documentation

## 2024-05-25 DIAGNOSIS — I10 Essential (primary) hypertension: Secondary | ICD-10-CM | POA: Diagnosis not present

## 2024-05-25 DIAGNOSIS — R45851 Suicidal ideations: Secondary | ICD-10-CM | POA: Insufficient documentation

## 2024-05-25 DIAGNOSIS — F1024 Alcohol dependence with alcohol-induced mood disorder: Secondary | ICD-10-CM | POA: Insufficient documentation

## 2024-05-25 DIAGNOSIS — F1010 Alcohol abuse, uncomplicated: Secondary | ICD-10-CM | POA: Diagnosis present

## 2024-05-25 LAB — CBC WITH DIFFERENTIAL/PLATELET
Abs Immature Granulocytes: 0.04 K/uL (ref 0.00–0.07)
Basophils Absolute: 0.1 K/uL (ref 0.0–0.1)
Basophils Relative: 1 %
Eosinophils Absolute: 0.4 K/uL (ref 0.0–0.5)
Eosinophils Relative: 4 %
HCT: 38.7 % — ABNORMAL LOW (ref 39.0–52.0)
Hemoglobin: 13.2 g/dL (ref 13.0–17.0)
Immature Granulocytes: 0 %
Lymphocytes Relative: 29 %
Lymphs Abs: 2.6 K/uL (ref 0.7–4.0)
MCH: 34.3 pg — ABNORMAL HIGH (ref 26.0–34.0)
MCHC: 34.1 g/dL (ref 30.0–36.0)
MCV: 100.5 fL — ABNORMAL HIGH (ref 80.0–100.0)
Monocytes Absolute: 0.6 K/uL (ref 0.1–1.0)
Monocytes Relative: 6 %
Neutro Abs: 5.5 K/uL (ref 1.7–7.7)
Neutrophils Relative %: 60 %
Platelets: 259 K/uL (ref 150–400)
RBC: 3.85 MIL/uL — ABNORMAL LOW (ref 4.22–5.81)
RDW: 13.2 % (ref 11.5–15.5)
WBC: 9.2 K/uL (ref 4.0–10.5)
nRBC: 0 % (ref 0.0–0.2)

## 2024-05-25 LAB — URINE DRUG SCREEN
Amphetamines: NEGATIVE
Barbiturates: NEGATIVE
Benzodiazepines: POSITIVE — AB
Cocaine: NEGATIVE
Fentanyl: NEGATIVE
Methadone Scn, Ur: NEGATIVE
Opiates: NEGATIVE
Tetrahydrocannabinol: POSITIVE — AB

## 2024-05-25 LAB — COMPREHENSIVE METABOLIC PANEL WITH GFR
ALT: 21 U/L (ref 0–44)
AST: 20 U/L (ref 15–41)
Albumin: 4.7 g/dL (ref 3.5–5.0)
Alkaline Phosphatase: 109 U/L (ref 38–126)
Anion gap: 12 (ref 5–15)
BUN: 5 mg/dL — ABNORMAL LOW (ref 6–20)
CO2: 26 mmol/L (ref 22–32)
Calcium: 9.7 mg/dL (ref 8.9–10.3)
Chloride: 102 mmol/L (ref 98–111)
Creatinine, Ser: 0.86 mg/dL (ref 0.61–1.24)
GFR, Estimated: >60 mL/min (ref >60)
Glucose, Bld: 87 mg/dL (ref 70–99)
Potassium: 3.9 mmol/L (ref 3.5–5.1)
Sodium: 140 mmol/L (ref 135–145)
Total Bilirubin: 0.5 mg/dL (ref 0.0–1.2)
Total Protein: 8.2 g/dL — ABNORMAL HIGH (ref 6.5–8.1)

## 2024-05-25 LAB — ETHANOL: Alcohol, Ethyl (B): 197 mg/dL — ABNORMAL HIGH (ref <15)

## 2024-05-25 LAB — SALICYLATE LEVEL: Salicylate Lvl: <7 mg/dL — ABNORMAL LOW (ref 7.0–30.0)

## 2024-05-25 LAB — ACETAMINOPHEN LEVEL: Acetaminophen (Tylenol), Serum: <10 ug/mL — ABNORMAL LOW (ref 10–30)

## 2024-05-25 MED ORDER — THIAMINE MONONITRATE 100 MG PO TABS
100.0000 mg | ORAL_TABLET | Freq: Every day | ORAL | Status: DC
  Administered 2024-05-26: 100 mg via ORAL
  Filled 2024-05-25 (×2): qty 1

## 2024-05-25 MED ORDER — THIAMINE HCL 100 MG/ML IJ SOLN
100.0000 mg | Freq: Every day | INTRAMUSCULAR | Status: DC
  Filled 2024-05-25 (×2): qty 1

## 2024-05-25 MED ORDER — OLANZAPINE 10 MG IM SOLR: 5.0000 mg | Freq: Once | INTRAMUSCULAR | Status: DC

## 2024-05-25 MED ORDER — LORAZEPAM 1 MG PO TABS
1.0000 mg | ORAL_TABLET | Freq: Once | ORAL | Status: AC
  Administered 2024-05-25: 1 mg via ORAL
  Filled 2024-05-25: qty 1

## 2024-05-25 MED ORDER — LORAZEPAM 1 MG PO TABS: 1.0000 mg | ORAL_TABLET | ORAL | Status: DC | PRN

## 2024-05-25 MED ORDER — ADULT MULTIVITAMIN W/MINERALS CH
1.0000 | ORAL_TABLET | Freq: Every day | ORAL | Status: DC
  Administered 2024-05-25 – 2024-05-26 (×2): 1 via ORAL
  Filled 2024-05-25 (×2): qty 1

## 2024-05-25 MED ORDER — FOLIC ACID 1 MG PO TABS
1.0000 mg | ORAL_TABLET | Freq: Every day | ORAL | Status: DC
  Administered 2024-05-25 – 2024-05-26 (×2): 1 mg via ORAL
  Filled 2024-05-25 (×2): qty 1

## 2024-05-25 MED ORDER — LORAZEPAM 2 MG/ML IJ SOLN: 1.0000 mg | INTRAMUSCULAR | Status: DC | PRN

## 2024-05-25 MED ORDER — GABAPENTIN 300 MG PO CAPS
300.0000 mg | ORAL_CAPSULE | Freq: Three times a day (TID) | ORAL | Status: DC
  Administered 2024-05-25 – 2024-05-26 (×2): 300 mg via ORAL
  Filled 2024-05-25 (×2): qty 1

## 2024-05-25 MED ORDER — LORAZEPAM 2 MG/ML IJ SOLN: 2.0000 mg | Freq: Once | INTRAMUSCULAR | Status: DC

## 2024-05-25 NOTE — ED Notes (Addendum)
 Patient has been dressed out by this clinical research associate and 2 security members. Pt was provided with hospital attire scrubs and bags for pt belongings.  Belongings are : Pair of brown shoes Pair of black socks Underwear  Gray sweats White tank With [employer] 3 personal book bags (labeled and stickered) Number of bags are 5 of 5. All labeled and stickered.  Ear rings are still on pt ears, could not be taken off.

## 2024-05-25 NOTE — ED Notes (Signed)
 IVC PENDING  CONSULT ?

## 2024-05-25 NOTE — BH Assessment (Signed)
 Psych Team attempted to interview patient. During assessment, patient was slurring his words, disorganized thought and speech. Will try back at a later time. Patient unable to participate.

## 2024-05-25 NOTE — ED Notes (Signed)
IVC/Pending Consult

## 2024-05-25 NOTE — ED Notes (Addendum)
 pt requesting something to help calm him down. pt states  I am going to blow up soon and no one wants that. Provider made aware.

## 2024-05-25 NOTE — ED Triage Notes (Signed)
 Pt comes with c/o detox, SI and HI. Pt states he went to [facility] and was brought here due to being heavily intoxicated. Pt states he has been drinking 8-9 years. Pt states he drink 2-3 5ths daily.  Pt states he drank 1-5ht today, 2 bootleggers and 4 loco beers. Pt slurring with words and when asked if he is having any SI and HI thoughts. Pt states if no one was around I would Kill you right now. Pt states nah I would kill myself before anyone else.

## 2024-05-25 NOTE — Consult Note (Signed)
 Psychiatry team attempted to assess patient at this time. When psychiatry team entered room and introduced self, patient stated I'm okay and pulled blanket over his head to cover his face. This provider attempted to ask patient what brought him into the hospital and patient displayed slurring of words and incomprehensible speech, consistent with likely alcohol intoxication. Patient unable to participate in psychiatric assessment at this time due to current status. Patient alcohol level noted to be 197. Psychiatry will attempt to reassess patient once he is more alert and able to participate fully in assessment.

## 2024-05-25 NOTE — ED Notes (Signed)
 Dinner tray provided to pt

## 2024-05-25 NOTE — ED Notes (Signed)
 Vol /psych consult pending / moved to [facility] 4

## 2024-05-25 NOTE — ED Notes (Signed)
 Lunch tray provided to pt.

## 2024-05-25 NOTE — ED Notes (Deleted)
 IVC/  PENDING  PLACEMENT

## 2024-05-25 NOTE — ED Notes (Signed)
 Pt given snack and drink at this time. Pt calm and cooperative.

## 2024-05-26 DIAGNOSIS — F1024 Alcohol dependence with alcohol-induced mood disorder: Secondary | ICD-10-CM

## 2024-05-26 NOTE — ED Provider Notes (Signed)
 Emergency Medicine Observation Re-evaluation Note   BP 121/76 (BP Location: Left Arm)   Pulse 91   Temp 98.2 F (36.8 C) (Oral)   Resp 18   Ht 6' 1 (1.854 m)   Wt 120.2 kg   SpO2 98%   BMI 34.96 kg/m    ED Course / MDM   No reported events during my shift at the time of this note.   Pt is awaiting dispo from consultants   Ginnie Shams MD    Shams Ginnie, MD 05/26/24 [phone]

## 2024-05-26 NOTE — Progress Notes (Addendum)
 Per Zelda NP,  patient will be psych cleared for discharge and inquired about [facility] resource.  [facility] gave hard copy of RTSA resource to patient's RN Nada).  [place], [facility] [phone]

## 2024-05-26 NOTE — ED Notes (Signed)
 Meal provided

## 2024-05-26 NOTE — ED Notes (Signed)
 Pt discharged at this time. RN reviewed discharge instructions with pt. Pt verbalized understanding. RR even and unlabored. Pt denies any questions or needs at this time. Pt ambulatory at discharge.  Pt states he is going to [facility] to [facility] facility at this time. [facility] approves pt coming.

## 2024-05-26 NOTE — ED Notes (Signed)
 Patient sleeping

## 2024-05-26 NOTE — ED Provider Notes (Signed)
 Patient seen and evaluated by psychiatry who has cleared the patient from their perspective.  Patient suitable for discharge and transport to alcohol rehab facility.  Nursing staff has confirmed with facility that they can take him today, patient will be going to [facility]   Claudene Rover, MD 05/26/24 1255

## 2024-05-26 NOTE — Consult Note (Signed)
 [facility] Psychiatric Consult Initial  Patient Name: .Christian Dickerson  MRN: [id number]  DOB: 03-27-1986  Consult Order details:  Orders (From admission, onward)     Start     Ordered   05/25/24 1535  CONSULT TO CALL ACT TEAM       Ordering Provider: Dicky Anes, MD  Provider:  (Not yet assigned)  Question:  Reason for Consult?  Answer:  Psych consult   05/25/24 1534   05/25/24 1535  IP CONSULT TO PSYCHIATRY       Ordering Provider: Dicky Anes, MD  Provider:  (Not yet assigned)  Question Answer Comment  Reason for consult: Other (see comments)   Comments: alcohol abuse, suicidal statement (while intoxicated)      05/25/24 1534             Mode of Visit: In person    Psychiatry Consult Evaluation  Service Date: May 26, 2024 LOS:  LOS: 0 days  Chief Complaint Id be better if I was at the rehab place right now  Primary Psychiatric Diagnoses  Alcohol dependence with alcohol-induced mood disorder   Assessment   [name] is a 38 y.o. male admitted: Presented to the EDfor 05/25/2024 12:13 PM for alcohol intoxication and making suicidal statements while intoxicated. He carries the psychiatric diagnoses of alcohol use disorder, unspecified mood disorder and has a past medical history of diverticulosis and diverticulitis of colon with perforation.    Psychiatry attempted to see patient yesterday, but patient appeared too intoxicated to fully participate in exam.  Today on exam, 05/26/2024, patient was alert and oriented x 4.  Patient was apologetic of behaviors yesterday.  Patient reported events that occurred yesterday were a blur.  He stated he was on his way to the rehab program that he was supposed to start yesterday and while waiting for the training, he went to a gas station with the intent to buy cigarettes.  He reported when he was in the gas station he then purchased at least 2 4 Loco's and chugged them.  He then remembers getting off the train in  [place] and chatting with a emergency planning/management officer and then ending up here.  He did not recall or remember making suicidal statements or any of the statements noted in the documentation.  He adamantly denied current suicidal or homicidal ideations.  He denied current auditory or visual hallucinations as well.  He reported frustration and disappointment in himself for his actions.  Patient reported he believes he needs to go to the rehabilitation program and would not benefit from inpatient psychiatric treatment.  It appears symptoms that occurred yesterday were under the context of alcohol intoxication.  Nursing staff reported this morning that patient has been calm and cooperative with all staff members and had aggressive behaviors or self-harm behaviors.  Patient did give permission for psychiatry team to contact his brother and sister-in-law for collateral information.  Sister-in-law, Christian Dickerson, was called by this provider and brother was present for the conversation.  They reported no current safety concerns that patient would harm himself or others.  They also reported that patient needed to go to rehab and believe he may have slipped yesterday due to being nervous about going to the program.  At this time, patient is psychiatrically cleared.  Our behavioral health coordinator provided patient with number to local treatment facility that patient reported he was going to go to yesterday.  Patient was instructed to call this number and speak with facility about  potential admission.  Psychiatry team will place [facility] consult for further resources regarding alcohol use disorder programs if patient unable to align with the service.  Diagnoses:  Active Hospital problems: Active Problems:   Alcohol dependence with alcohol-induced mood disorder (HCC)    Plan   ## Psychiatric Medication Recommendations:  None at this time  ## Medical Decision Making Capacity: Not specifically addressed in this encounter  ## Further  Work-up:    -- Pertinent labwork reviewed earlier this admission includes: cmp, ethanol, cbc, salicylate level, acetaminophen  level, urine drug screen   ## Disposition:-- There are no psychiatric contraindications to discharge at this time  ## Behavioral / Environmental: - No specific recommendations at this time.     ## Safety and Observation Level:  - Based on my clinical evaluation, I estimate the patient to be at low risk of self harm in the current setting. - At this time, we recommend  routine. This decision is based on my review of the chart including patient's history and current presentation, interview of the patient, mental status examination, and consideration of suicide risk including evaluating suicidal ideation, plan, intent, suicidal or self-harm behaviors, risk factors, and protective factors. This judgment is based on our ability to directly address suicide risk, implement suicide prevention strategies, and develop a safety plan while the patient is in the clinical setting. Please contact our team if there is a concern that risk level has changed.  CSSR Risk Category:C-SSRS RISK CATEGORY: No Risk  Suicide Risk Assessment: Patient has following modifiable risk factors for suicide: triggering events, which we are addressing by providing patient number for residential service that he was previously supposed to go to and can consult TOC for further assistance if issues with this program. Patient has following non-modifiable or demographic risk factors for suicide: male gender and psychiatric hospitalization Patient has the following protective factors against suicide: Access to outpatient mental health care and Supportive family  Thank you for this consult request. Recommendations have been communicated to the primary team.  We will sign off at this time.   [name], NP        History of Present Illness  Relevant Aspects of Hospital ED   Patient Report:  Psychiatry  attempted to see patient yesterday, but patient appeared too intoxicated to fully participate in exam.  Today on exam, 05/26/2024, patient was alert and oriented x 4.  Patient was apologetic of behaviors yesterday.  Patient reported events that occurred yesterday were a blur.  He stated he was on his way to the rehab program that he was supposed to start yesterday and while waiting for the train, he went to a gas station with the intent to buy cigarettes.  He reported when he was in the gas station he then purchased at least 2 4 Loco's and chugged them.  He then remembers getting off the train in [place] and chatting with a emergency planning/management officer and then ending up here.  He did not recall or remember making suicidal statements or any of the statements noted in the documentation.  He adamantly denied current suicidal or homicidal ideations.  He denied current auditory or visual hallucinations as well.  He reported frustration and disappointment in himself for his actions.  Patient reported he believes he needs to go to the rehabilitation program and would not benefit from inpatient psychiatric treatment.  It appears symptoms that occurred yesterday were under the context of alcohol intoxication.  Nursing staff reported this morning that patient has  been calm and cooperative with all staff members and had no aggressive behaviors or self-harm behaviors.  Patient did give permission for psychiatry team to contact his brother and sister-in-law for collateral information.  Sister-in-law, Christian Dickerson, was called by this provider and brother was present for the conversation.  They reported no current safety concerns that patient would harm himself or others.  They also reported that patient needed to go to rehab and believe he may have slipped yesterday due to being nervous about going to the program.  At this time, patient is psychiatrically cleared.  Our behavioral health coordinator provided patient with number to local  treatment facility that patient reported he was going to go to yesterday.  Patient was instructed to call this number and speak with facility about potential admission.  Psychiatry team will place [facility] consult for further resources regarding alcohol use disorder programs if patient unable to align with the service.  On current presentation there was no evidence of psychosis or mania and patient did not appear to be responding to internal stimuli. At this time, patient does not appear to be a risk to self or others.While future psychiatric events cannot be accurately predicted, the patient does not currently require acute inpatient psychiatric care and does not currently meet [place]  involuntary commitment criteria.   Patient did report prior to this that he was sober for a week after recently being discharged from the hospital.  His family also collaborated this information.  Patient reported prior to this, that he was compliant with all medications that he was recently discharged from the hospital with.  He reported only missing yesterday's doses due to these events.  He reported to desire to go to the facility that he was post to go to yesterday for alcohol rehabilitation.  He reported that this facility had also told them that after completion of the program they would assist him in placement at an [facility].  Patient believed that this would be the most beneficial for him and family agreed.  Psych ROS:  Depression: Denied current Anxiety: Patient reported anxiety about current situation and wanting to transition to rehab Mania (lifetime and current): Noted history of bipolar affective disorder, no current objective or reported symptoms of mania Psychosis: (lifetime and current): No current objective or reported symptoms of psychosis  Collateral information:  Contacted Marie at number listed in chart on 05/26/2024    Psychiatric and Social History  Psychiatric History:  Information  collected from Patient/chart review  Prev Dx/Sx: Alcohol use disorder, bipolar affective disorder Current Psych Provider: None- working to establish with rehab place Home Meds (current): Abilify, librium , prozac, gabapentin Previous Med Trials: Unsure Therapy: None- working to establish with rehab place  Prior Psych Hospitalization: Yes  Prior Self Harm: Reported yes in the past, but believes alcohol use contributed to Prior Violence: Endorsed  Family Psych History: Schizophrenia (paternal aunt) Family Hx suicide: Denies  Social History:   Educational Hx: GED 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, previous jail time for 10 years Living Situation: Previously with his brother Spiritual Hx: Religious- suicide against beliefs Access to weapons/lethal means: Denies  Substance History Alcohol: Daily use for several years Type of alcohol liquor Last Drink 05/25/2024 Number of drinks: Reported 2 4 lokos prior to ED arrival History of alcohol withdrawal seizures: Yes History of DT's: Denies Tobacco: Cigarettes Illicit drugs: Marijuana Prescription drug abuse: Endorses previous abuse of prescription drugs (Ritalin, Wellbutrin, Zoloft, others) but not recent  Rehab hx:  Denies- interested in pursuing currently   Exam Findings  Physical Exam: Reviewed and agree with the physical exam findings conducted by the medical provider Vital Signs:  Temp:  [98.1 F (36.7 C)-98.5 F (36.9 C)] 98.5 F (36.9 C) (11/20 0800) Pulse Rate:  [82-91] 89 (11/20 0800) Resp:  [16-18] 18 (11/20 0800) BP: (111-134)/(72-85) 134/85 (11/20 0800) SpO2:  [98 %] 98 % (11/20 0800) Blood pressure 134/85, pulse 89, temperature 98.5 F (36.9 C), temperature source Oral, resp. rate 18, height 6' 1 (1.854 m), weight 120.2 kg, SpO2 98%. Body mass index is 34.96 kg/m.    Mental Status Exam: General Appearance: Casual  Orientation:  Full (Time, Place, and Person)  Memory:  Immediate;    Fair Recent;   Poor Remote;   Fair  Concentration:  Concentration: Fair  Recall:  Fair  Attention  Fair  Eye Contact:  Good  Speech:  Clear and Coherent  Language:  Good  Volume:  Normal  Mood: euthymic  Affect:  Congruent  Thought Process:  Coherent, Goal Directed, and Linear  Thought Content:  WDL and Logical  Suicidal Thoughts:  No  Homicidal Thoughts:  No  Judgement:  Impaired  Insight:  Present  Psychomotor Activity:  Normal  Akathisia:  No  Fund of Knowledge:  Fair      Assets:  Manufacturing Systems Engineer Desire for Improvement Housing Social Support  Cognition:  WNL  ADL's:  Intact  AIMS (if indicated):        Other History   These have been pulled in through the EMR, reviewed, and updated if appropriate.  Family History:  The patient's family history includes Heart attack in his father and mother.  Medical History: Past Medical History:  Diagnosis Date   Alcohol abuse    Diverticulitis    Hypertension    Substance abuse (HCC)     Surgical History: Past Surgical History:  Procedure Laterality Date   ANKLE SURGERY     CHEST TUBE INSERTION     LITHOTRIPSY     TIBIA FRACTURE SURGERY     TONSILLECTOMY AND ADENOIDECTOMY       Medications:   Current Facility-Administered Medications:    folic acid  (FOLVITE ) tablet 1 mg, 1 mg, Oral, Daily, Fernand Rossie HERO, MD, 1 mg at 05/26/24 0944   gabapentin  (NEURONTIN ) capsule 300 mg, 300 mg, Oral, TID, Fernand Rossie HERO, MD, 300 mg at 05/26/24 0944   LORazepam  (ATIVAN ) tablet 1-4 mg, 1-4 mg, Oral, Q1H PRN **OR** LORazepam  (ATIVAN ) injection 1-4 mg, 1-4 mg, Intravenous, Q1H PRN, Fernand Rossie HERO, MD   LORazepam  (ATIVAN ) injection 2 mg, 2 mg, Intramuscular, Once, Fernand Rossie HERO, MD   multivitamin with minerals tablet 1 tablet, 1 tablet, Oral, Daily, Fernand Rossie HERO, MD, 1 tablet at 05/26/24 0944   OLANZapine  (ZYPREXA ) injection 5 mg, 5 mg, Intramuscular, Once, Fernand Rossie HERO, MD   thiamine  (VITAMIN B1) tablet 100 mg, 100  mg, Oral, Daily, 100 mg at 05/26/24 0944 **OR** thiamine  (VITAMIN B1) injection 100 mg, 100 mg, Intravenous, Daily, Fernand Rossie HERO, MD  Current Outpatient Medications:    acetaminophen  (TYLENOL ) 325 MG tablet, Take 2 tablets (650 mg total) by mouth every 8 (eight) hours as needed for headache, fever or mild pain (pain score 1-3)., Disp: , Rfl:    amLODipine  (NORVASC ) 5 MG tablet, Take 1 tablet (5 mg total) by mouth daily., Disp: 30 tablet, Rfl: 0   ARIPiprazole  (ABILIFY ) 5 MG tablet, Take 1 tablet (5 mg total) by mouth at  bedtime., Disp: 30 tablet, Rfl: 0   cyanocobalamin 1000 MCG tablet, Take 1 tablet (1,000 mcg total) by mouth daily., Disp: 30 tablet, Rfl: 2   FLUoxetine (PROZAC) 20 MG capsule, Take 1 capsule (20 mg total) by mouth daily., Disp: 30 capsule, Rfl: 0   gabapentin (NEURONTIN) 300 MG capsule, Take 1 capsule (300 mg total) by mouth 3 (three) times daily., Disp: 90 capsule, Rfl: 0   pantoprazole  (PROTONIX ) 40 MG tablet, Take 1 tablet (40 mg total) by mouth daily., Disp: 30 tablet, Rfl: 0   tamsulosin  (FLOMAX ) 0.4 MG CAPS capsule, Take 1 capsule (0.4 mg total) by mouth daily., Disp: 30 capsule, Rfl: 0   thiamine  (VITAMIN B-1) 100 MG tablet, Take 1 tablet (100 mg total) by mouth daily., Disp: 30 tablet, Rfl: 0   Vitamin D, Ergocalciferol, (DRISDOL) 1.25 MG (50000 UNIT) CAPS capsule, Take 1 capsule (50,000 Units total) by mouth every 7 (seven) days., Disp: 12 capsule, Rfl: 0   chlordiazePOXIDE  (LIBRIUM ) 25 MG capsule, Take 1 capsule (25 mg total) by mouth daily. (Patient not taking: Reported on 05/25/2024), Disp: 30 capsule, Rfl: 0  Allergies: Allergies  Allergen Reactions   Latex     [name], NP This note was created using Dragon dictation software. Please excuse any inadvertent transcription errors. Case was discussed with supervising physician Dr. Jadapalle who is agreeable with current plan.

## 2024-05-26 NOTE — ED Notes (Signed)
vol/pending consult.. 

## 2024-05-26 NOTE — ED Provider Notes (Signed)
 [facility] Provider Note    Event Date/Time   First MD Initiated Contact with Patient 05/25/24 2309     (approximate)   History   Hi and Detox  EM caveat: Patient seen does not seem to want to provide history, not exactly compliant with request to obtain additional history  HPI  Christian Dickerson is a 38 y.o. male history of alcohol abuse substance abuse mental health presentations to the ER etc.  Triage note was more informative.  At this time the patient resting in the bed, opens his eyes tells me his name but does not seem to want to give any additional history beyond that he drinks alcohol and drink today  Pt comes with c/o detox, SI and HI. Pt states he went to [facility] and was brought here due to being heavily intoxicated. Pt states he has been drinking 8-9 years. Pt states he drink 2-3 5ths daily.   Pt states he drank 1-5ht today, 2 bootleggers and 4 loco beers. Pt slurring with words and when asked if he is having any SI and HI thoughts. Pt states if no one was around I would Kill you right now. Pt states nah I would kill myself before anyone else.  Past Medical History:  Diagnosis Date   Alcohol abuse    Diverticulitis    Hypertension    Substance abuse [facility])         Physical Exam   Triage Vital Signs: ED Triage Vitals  Encounter Vitals Group     BP 05/25/24 1203 111/72     Girls Systolic BP Percentile --      Girls Diastolic BP Percentile --      Boys Systolic BP Percentile --      Boys Diastolic BP Percentile --      Pulse Rate 05/25/24 1203 82     Resp 05/25/24 1203 16     Temp 05/25/24 1203 98.1 F (36.7 C)     Temp Source 05/25/24 1203 Oral     SpO2 05/25/24 1203 98 %     Weight 05/25/24 1120 265 lb (120.2 kg)     Height 05/25/24 1120 6' 1 (1.854 m)     Head Circumference --      Peak Flow --      Pain Score 05/25/24 1120 0     Pain Loc --      Pain Education --      Exclude from Growth Chart --     Most recent vital  signs: Vitals:   05/26/24 0800 05/26/24 1312  BP: 134/85   Pulse: 89 86  Resp: 18 18  Temp: 98.5 F (36.9 C) 98.2 F (36.8 C)  SpO2: 98% 98%     General: Awake, no distress.  He is resting, opens eyes to voice and he looks at me, but seems to be more interested in sleeping.  He does alert though but his speech is slightly slurred.  He is somnolent but in no distress respirating comfortably.  Does not interact or answer questions at this point but earlier at triage she did.  I do not see any evidence of trauma or injury.  There is no report of trauma there is no reported ingestion outside of heavy alcohol use send earlier had voiced thoughts about suicidal ideation CV:  Good peripheral perfusion.  Normal tone Resp:  Normal effort.  Clear bilateral Abd:  No distention.  Other:  Moves extremities well.  Opens eyes.  Lays on his side resting primarily   ED Results / Procedures / Treatments   Labs (all labs ordered are listed, but only abnormal results are displayed) Labs Reviewed  COMPREHENSIVE METABOLIC PANEL WITH GFR - Abnormal; Notable for the following components:      Result Value   BUN 5 (*)    Total Protein 8.2 (*)    All other components within normal limits  ETHANOL - Abnormal; Notable for the following components:   Alcohol, Ethyl (B) 197 (*)    All other components within normal limits  URINE DRUG SCREEN - Abnormal; Notable for the following components:   Benzodiazepines POSITIVE (*)    Tetrahydrocannabinol POSITIVE (*)    All other components within normal limits  CBC WITH DIFFERENTIAL/PLATELET - Abnormal; Notable for the following components:   RBC 3.85 (*)    HCT 38.7 (*)    MCV 100.5 (*)    MCH 34.3 (*)    All other components within normal limits  SALICYLATE LEVEL - Abnormal; Notable for the following components:   Salicylate Lvl <7.0 (*)    All other components within normal limits  ACETAMINOPHEN  LEVEL - Abnormal; Notable for the following components:    Acetaminophen  (Tylenol ), Serum <10 (*)    All other components within normal limits   Medical screening labs demonstrate positive benzodiazepine and cannabinoid.  Elevated alcohol level approximately 200 likely explaining his somnolence and slurring of speech.   EKG     RADIOLOGY     PROCEDURES:  Critical Care performed: No  Procedures      IMPRESSION / MDM / ASSESSMENT AND PLAN / ED COURSE  I reviewed the triage vital signs and the nursing notes.                              Differential diagnosis includes, but is not limited to, alcohol abuse, possible concomitant suicidal ideation or mental health disorder etc.  Patient not exactly forthcoming he seems to be more interested in resting and sleeping at this time and I sort of question if there might be some component of a bit of a malingering type presentation.  He seems more interested in resting and does not seem particularly like he wants to answer questions for me at this time, but seems to be in no distress with slight slurring of speech.  I think you will need additional time to sober and reassess his symptomatology.  He reported suicidal ideation, but this is also in the context of alcohol abuse I am not certain if this is truly suicidal intent or more of a substance abuse issue at this time.  Medical screening labs reassuring vital signs reassuring.  Ongoing care signed to Dr. Fernand, currently voluntary with plan to see psychiatry patient's presentation is most consistent with acute complicated illness / injury requiring diagnostic workup.          FINAL CLINICAL IMPRESSION(S) / ED DIAGNOSES   Final diagnoses:  Alcohol abuse  Suicidal thoughts     Rx / DC Orders   ED Discharge Orders     None        Note:  This document was prepared using Dragon voice recognition software and may include unintentional dictation errors.   Dicky Anes, MD 05/26/24 [phone]

## 2024-05-28 NOTE — Anesthesia Postprocedure Evaluation (Signed)
 Anesthesia Post Note  Patient: Christian Dickerson  Procedure(s) Performed: COLECTOMY, PARTIAL, ROBOT-ASSISTED, LAPAROSCOPIC  Patient location during evaluation: PACU Anesthesia Type: General Level of consciousness: awake and alert Pain management: pain level controlled Vital Signs Assessment: post-procedure vital signs reviewed and stable Respiratory status: spontaneous breathing, nonlabored ventilation, respiratory function stable and patient connected to nasal cannula oxygen Cardiovascular status: blood pressure returned to baseline and stable Postop Assessment: no apparent nausea or vomiting Anesthetic complications: no   No notable events documented.   Last Vitals:  Vitals:   05/16/24 0830 05/16/24 1451  BP: 117/75 (!) 142/95  Pulse: (!) 59 81  Resp: 17 17  Temp: 36.7 C 36.8 C  SpO2: 99% 100%    Last Pain:  Vitals:   05/16/24 1451  TempSrc: Oral  PainSc:                  Prentice Murphy

## 2024-06-08 ENCOUNTER — Other Ambulatory Visit: Payer: Self-pay

## 2024-07-22 ENCOUNTER — Emergency Department: Payer: MEDICAID

## 2024-07-22 ENCOUNTER — Emergency Department
Admission: EM | Admit: 2024-07-22 | Discharge: 2024-07-23 | Disposition: A | Discharge status: Other Acute Inpatient Hospital | Payer: MEDICAID | Attending: Emergency Medicine | Admitting: Emergency Medicine

## 2024-07-22 DIAGNOSIS — F10120 Alcohol abuse with intoxication, uncomplicated: Secondary | ICD-10-CM | POA: Diagnosis not present

## 2024-07-22 DIAGNOSIS — I10 Essential (primary) hypertension: Secondary | ICD-10-CM | POA: Insufficient documentation

## 2024-07-22 DIAGNOSIS — R45851 Suicidal ideations: Secondary | ICD-10-CM | POA: Insufficient documentation

## 2024-07-22 DIAGNOSIS — Y908 Blood alcohol level of 240 mg/100 ml or more: Secondary | ICD-10-CM | POA: Insufficient documentation

## 2024-07-22 DIAGNOSIS — F10920 Alcohol use, unspecified with intoxication, uncomplicated: Secondary | ICD-10-CM

## 2024-07-22 DIAGNOSIS — R4689 Other symptoms and signs involving appearance and behavior: Secondary | ICD-10-CM | POA: Diagnosis present

## 2024-07-22 DIAGNOSIS — F319 Bipolar disorder, unspecified: Secondary | ICD-10-CM | POA: Diagnosis not present

## 2024-07-22 LAB — LIPASE, BLOOD: Lipase: 40 U/L (ref 11–51)

## 2024-07-22 LAB — URINALYSIS, ROUTINE W REFLEX MICROSCOPIC
Bilirubin Urine: NEGATIVE
Glucose, UA: NEGATIVE mg/dL
Hgb urine dipstick: NEGATIVE
Ketones, ur: NEGATIVE mg/dL
Leukocytes,Ua: NEGATIVE
Nitrite: NEGATIVE
Protein, ur: NEGATIVE mg/dL
Specific Gravity, Urine: 1.006 (ref 1.005–1.030)
pH: 6 (ref 5.0–8.0)

## 2024-07-22 LAB — COMPREHENSIVE METABOLIC PANEL WITH GFR
ALT: 20 U/L (ref 0–44)
AST: 28 U/L (ref 15–41)
Albumin: 4.6 g/dL (ref 3.5–5.0)
Alkaline Phosphatase: 119 U/L (ref 38–126)
Anion gap: 17 — ABNORMAL HIGH (ref 5–15)
BUN: 12 mg/dL (ref 6–20)
CO2: 20 mmol/L — ABNORMAL LOW (ref 22–32)
Calcium: 9 mg/dL (ref 8.9–10.3)
Chloride: 103 mmol/L (ref 98–111)
Creatinine, Ser: 0.75 mg/dL (ref 0.61–1.24)
GFR, Estimated: >60 mL/min
Glucose, Bld: 97 mg/dL (ref 70–99)
Potassium: 4.3 mmol/L (ref 3.5–5.1)
Sodium: 140 mmol/L (ref 135–145)
Total Bilirubin: 0.4 mg/dL (ref 0.0–1.2)
Total Protein: 7.7 g/dL (ref 6.5–8.1)

## 2024-07-22 LAB — FIBRINOGEN: Fibrinogen: 225 mg/dL (ref 210–475)

## 2024-07-22 LAB — CBC WITH DIFFERENTIAL/PLATELET
Abs Immature Granulocytes: 0.03 K/uL (ref 0.00–0.07)
Basophils Absolute: 0.1 K/uL (ref 0.0–0.1)
Basophils Relative: 1 %
Eosinophils Absolute: 0.3 K/uL (ref 0.0–0.5)
Eosinophils Relative: 3 %
HCT: 43.6 % (ref 39.0–52.0)
Hemoglobin: 15.1 g/dL (ref 13.0–17.0)
Immature Granulocytes: 0 %
Lymphocytes Relative: 45 %
Lymphs Abs: 4.1 K/uL — ABNORMAL HIGH (ref 0.7–4.0)
MCH: 32.5 pg (ref 26.0–34.0)
MCHC: 34.6 g/dL (ref 30.0–36.0)
MCV: 93.8 fL (ref 80.0–100.0)
Monocytes Absolute: 0.5 K/uL (ref 0.1–1.0)
Monocytes Relative: 6 %
Neutro Abs: 4.1 K/uL (ref 1.7–7.7)
Neutrophils Relative %: 45 %
Platelets: 231 K/uL (ref 150–400)
RBC: 4.65 MIL/uL (ref 4.22–5.81)
RDW: 13.8 % (ref 11.5–15.5)
Smear Review: NORMAL
WBC: 9.1 K/uL (ref 4.0–10.5)
nRBC: 0 % (ref 0.0–0.2)

## 2024-07-22 LAB — URINE DRUG SCREEN
Amphetamines: NEGATIVE
Barbiturates: NEGATIVE
Benzodiazepines: NEGATIVE
Cocaine: NEGATIVE
Fentanyl: NEGATIVE
Methadone Scn, Ur: NEGATIVE
Opiates: NEGATIVE
Tetrahydrocannabinol: POSITIVE — AB

## 2024-07-22 LAB — ACETAMINOPHEN LEVEL
Acetaminophen (Tylenol), Serum: <10 ug/mL — ABNORMAL LOW (ref 10–30)
Acetaminophen (Tylenol), Serum: <10 ug/mL — ABNORMAL LOW (ref 10–30)

## 2024-07-22 LAB — SALICYLATE LEVEL: Salicylate Lvl: <7 mg/dL — ABNORMAL LOW (ref 7.0–30.0)

## 2024-07-22 LAB — APTT: aPTT: 29 s (ref 24–36)

## 2024-07-22 LAB — PROTIME-INR
INR: 1.1 (ref 0.8–1.2)
Prothrombin Time: 14.8 s (ref 11.4–15.2)

## 2024-07-22 LAB — ETHANOL: Alcohol, Ethyl (B): 317 mg/dL

## 2024-07-22 MED ORDER — LORAZEPAM 2 MG/ML IJ SOLN
INTRAMUSCULAR | Status: AC
  Administered 2024-07-22: 2 mg via INTRAMUSCULAR
  Filled 2024-07-22: qty 1

## 2024-07-22 MED ORDER — LORAZEPAM 2 MG/ML IJ SOLN
INTRAMUSCULAR | Status: AC
  Filled 2024-07-22: qty 1

## 2024-07-22 MED ORDER — DIPHENHYDRAMINE HCL 50 MG/ML IJ SOLN
INTRAMUSCULAR | Status: AC
  Filled 2024-07-22: qty 1

## 2024-07-22 MED ORDER — DIPHENHYDRAMINE HCL 50 MG/ML IJ SOLN
INTRAMUSCULAR | Status: AC
  Administered 2024-07-22: 50 mg via INTRAMUSCULAR
  Filled 2024-07-22: qty 1

## 2024-07-22 MED ORDER — DIPHENHYDRAMINE HCL 50 MG/ML IJ SOLN: 50.0000 mg | Freq: Once | INTRAMUSCULAR | Status: AC

## 2024-07-22 MED ORDER — LORAZEPAM 2 MG/ML IJ SOLN: 2.0000 mg | Freq: Once | INTRAMUSCULAR | Status: AC

## 2024-07-22 MED ORDER — HALOPERIDOL LACTATE 5 MG/ML IJ SOLN
INTRAMUSCULAR | Status: AC
  Administered 2024-07-22: 5 mg via INTRAMUSCULAR
  Filled 2024-07-22: qty 1

## 2024-07-22 MED ORDER — HALOPERIDOL LACTATE 5 MG/ML IJ SOLN: 5.0000 mg | Freq: Once | INTRAMUSCULAR | Status: AC

## 2024-07-22 MED ORDER — HALOPERIDOL LACTATE 5 MG/ML IJ SOLN
INTRAMUSCULAR | Status: AC
  Filled 2024-07-22: qty 1

## 2024-07-22 NOTE — BH Assessment (Signed)
 TTS is unable to complete consult at this time. Patient is too drowsy and lethargic to participate in interview.

## 2024-07-22 NOTE — ED Notes (Signed)
 IVC patient sedated and papers e court filed,  pending consult

## 2024-07-22 NOTE — ED Triage Notes (Signed)
 [place] Bystander called for bizarre behavior with pt endorsing SI and HI. Pt endorses ingesting an entire bottle of bleach and rat poison yesterday in an SI attempt. Presents intoxicated and physically threatening to staff expressing desire to die and kill others. Placed in restraint chair for staff and pt safety.   Past Medical History:  Diagnosis Date   Alcohol abuse    Diverticulitis    Hypertension    Substance abuse (HCC)

## 2024-07-22 NOTE — ED Notes (Signed)
 Poison controlled call at this time and spoke with [facility]. Recommends GI consult and scope if pt presents with sx of bleach ingestion. Also recommends Vitamin D  for rat poisoning. Monitor until back to baseline and collect basic labs, EKG, and supportive care.

## 2024-07-22 NOTE — BH Assessment (Signed)
 This writer attempted to assess patient, patient responds to his name but falls asleep very easily while this writer attempts to ask assessment questions. Medications were administered earlier which could impact ability to participate at this time. Will follow up when patient is more alert and oriented.

## 2024-07-22 NOTE — ED Provider Notes (Signed)
 "  [facility] Provider Note    Event Date/Time   First MD Initiated Contact with Patient 07/22/24 1300     (approximate)   History   No chief complaint on file.   HPI  Christian Dickerson is a 39 y.o. male who comes in with concerns for overdose.  Patient reports drinking a gallon of bleach as well as using rat poisoning yesterday.  He also reports drinking alcohol and using marijuana today.  He denies any trauma and hitting his head.  He reports having SI.  He reports that anytime he pees he has blood in his urine.  He denies being in any pain.  He denies any falls.  Physical Exam   Triage Vital Signs: ED Triage Vitals  Encounter Vitals Group     BP      Girls Systolic BP Percentile      Girls Diastolic BP Percentile      Boys Systolic BP Percentile      Boys Diastolic BP Percentile      Pulse      Resp      Temp      Temp src      SpO2      Weight      Height      Head Circumference      Peak Flow      Pain Score      Pain Loc      Pain Education      Exclude from Growth Chart     Most recent vital signs: There were no vitals filed for this visit.   General: Awake, no distress.  CV:  Good peripheral perfusion.  Resp:  Normal effort.  Abd:  No distention.  Soft and nontender Other:  Patient able to move all extremities does appear intoxicated but no evidence of any trauma.   ED Results / Procedures / Treatments   Labs (all labs ordered are listed, but only abnormal results are displayed) Labs Reviewed  CBC WITH DIFFERENTIAL/PLATELET - Abnormal; Notable for the following components:      Result Value   Lymphs Abs 4.1 (*)    All other components within normal limits  COMPREHENSIVE METABOLIC PANEL WITH GFR - Abnormal; Notable for the following components:   CO2 20 (*)    Anion gap 17 (*)    All other components within normal limits  SALICYLATE LEVEL - Abnormal; Notable for the following components:   Salicylate Lvl <7.0 (*)    All  other components within normal limits  ACETAMINOPHEN  LEVEL - Abnormal; Notable for the following components:   Acetaminophen  (Tylenol ), Serum <10 (*)    All other components within normal limits  ETHANOL - Abnormal; Notable for the following components:   Alcohol, Ethyl (B) 317 (*)    All other components within normal limits  LIPASE, BLOOD  PROTIME-INR  APTT  FIBRINOGEN   URINE DRUG SCREEN  URINALYSIS, ROUTINE W REFLEX MICROSCOPIC     EKG  My interpretation of EKG:    RADIOLOGY Pending   PROCEDURES:  Critical Care performed: No  Procedures   MEDICATIONS ORDERED IN ED: Medications - No data to display   IMPRESSION / MDM / ASSESSMENT AND PLAN / ED COURSE  I reviewed the triage vital signs and the nursing notes.   Patient's presentation is most consistent with acute presentation with potential threat to life or bodily function.    Patient comes in with concerns for intoxication and concern  for potentially ingesting rat poison, bleach.  On examination I do not see any evidence of trauma.  He is moving all extremities.  He denies hitting his head so intracranial hemorrhage seems less likely.  Will get labs to further evaluate and talk with poison control.  Given concerns for self-harm and intoxication I did place patient under IVC  Upon getting to the room patient came very agitated requiring Haldol , Ativan , Benadryl  And restraint chair.  Patient was eventually able to be taken out of restraint chair and I reevaluated patient  Poison control was contacted who stated that if he had ingested bleach within 24 hours to consider GI consult but I am not convinced that he actually took a entire gallon of bleach as he is got no symptoms to suggest this.  I will reevaluate patient and discuss further with him  3:37 PM reevaluated patient and he is denying any difficulties with swallowing or burning in his chest area.  He now is stating that he think he drank a gulp full of bleach  about 2 days ago.  I do not see obvious burn marks inside his mouth.   Ethanol elevated at 300 CBC reassuring CMP reassuring lipase normal coags normal fibrinogen  normal salicylate and Tylenol  are negative  We handed off to oncoming team.      FINAL CLINICAL IMPRESSION(S) / ED DIAGNOSES   Final diagnoses:  Suicidal ideation  Alcoholic intoxication without complication     Rx / DC Orders   ED Discharge Orders     None        Note:  This document was prepared using Dragon voice recognition software and may include unintentional dictation errors.   Ernest Ronal BRAVO, MD 07/22/24 1626  "

## 2024-07-22 NOTE — ED Notes (Signed)
 Spoke with poison control at this time due to pt now endorsing only drinking a swig of bleach 48 hours ago and not 1 gallon 24 hours ago. Poison control recommends GI consult and scope if pt presents with evidence of bleach ingestion with oral burning and / or trauma. Provider notified. Pt continues to sleep with RR even and unlabored without acute needs.

## 2024-07-23 MED ORDER — GABAPENTIN 300 MG PO CAPS: 300.0000 mg | ORAL_CAPSULE | Freq: Three times a day (TID) | ORAL | Status: DC

## 2024-07-23 MED ORDER — FLUOXETINE HCL 20 MG PO CAPS: 20.0000 mg | ORAL_CAPSULE | Freq: Every day | ORAL | Status: DC

## 2024-07-23 MED ORDER — ARIPIPRAZOLE 10 MG PO TABS: 5.0000 mg | ORAL_TABLET | Freq: Every day | ORAL | Status: DC

## 2024-07-23 NOTE — ED Notes (Signed)
 Transported to [facility] via [email or web address].

## 2024-07-23 NOTE — BH Assessment (Signed)
 PATIENT BED AVAILABLE AFTER 9AM ON 07/23/24  Patient has been accepted to [facility].  Patient assigned to [facility] Accepting physician is Dr. Alm Pyles.  Call report to [phone].  Representative was Intake Prudence.   ER Staff is aware of it:  [facility] ER Secretary  Dr. Fernand, ER MD  Jordan Patient's Nurse     :

## 2024-07-23 NOTE — ED Provider Notes (Signed)
 No acute events throughout my shift, awaiting psychiatric placement at this time.   Fernand Rossie HERO, MD 07/23/24 [phone]

## 2024-07-23 NOTE — BH Assessment (Signed)
 Comprehensive Clinical Assessment (CCA) Note  07/23/2024 Christian Dickerson [id number]  Chief Complaint: Patient is a 39 year old male presenting to [facility] ED under IVC. [place]age [place] Bystander called for bizarre behavior with pt endorsing SI and HI. Pt endorses ingesting an entire bottle of bleach and rat poison yesterday in an SI attempt. Presents intoxicated and physically threatening to staff expressing desire to die and kill others. Placed in restraint chair for staff and pt safety. During assessment patient appears alert and oriented x, cooperative but irritable. Patient reports I think the police brought me here but cannot recall what led up to the police brining him to the hospital. Patient only recalls drinking alcohol, when reminded that he drank bleach and rat poison he was able to recall. Patient reports I did that to die. I would rather kill myself because if I don't I'll hurt somebody else, I just recently thought about hurting other people, anybody that looks like a victim. Patient also reports that this is not his first attempt to end his life I ate a bunch of p ills in October and was hospitalized as a result. Patient also reports drinking alcohol daily I was up to a 5th and a half of vodka before they found me. Patient does not have a current therapist, he does not have a psychiatrist and has not been taking his medications. Patient continues to report SI/HI, denies AH/VH Chief Complaint  Patient presents with   Suicidal   Homicidal   Ingestion   Visit Diagnosis: Bipolar affective disorder. Depression. Alcohol abuse    CCA Screening, Triage and Referral (STR)  Patient Reported Information How did you hear about us ? Legal System  Referral name: No data recorded Referral phone number: No data recorded  Whom do you see for routine medical problems? No data recorded Practice/Facility Name: No data recorded Practice/Facility Phone Number: No data  recorded Name of Contact: No data recorded Contact Number: No data recorded Contact Fax Number: No data recorded Prescriber Name: No data recorded Prescriber Address (if known): No data recorded  What Is the Reason for Your Visit/Call Today? [place]. Bystander called for bizarre behavior with pt endorsing SI and HI. Pt endorses ingesting an entire bottle of bleach and rat poison yesterday in an SI attempt. Presents intoxicated and physically threatening to staff expressing desire to die and kill others. Placed in restraint chair for staff and pt safety.  How Long Has This Been Causing You Problems? > than 6 months  What Do You Feel Would Help You the Most Today? Treatment for Depression or other mood problem   Have You Recently Been in Any Inpatient Treatment (Hospital/Detox/Crisis Center/28-Day Program)? No data recorded Name/Location of Program/Hospital:No data recorded How Long Were You There? No data recorded When Were You Discharged? No data recorded  Have You Ever Received Services From [facility] Before? No data recorded Who Do You See at [facility]? No data recorded  Have You Recently Had Any Thoughts About Hurting Yourself? Yes  Are You Planning to Commit Suicide/Harm Yourself At This time? No   Have you Recently Had Thoughts About Hurting Someone [place]? Yes  Explanation: No data recorded  Have You Used Any Alcohol or Drugs in the Past 24 Hours? Yes  How Long Ago Did You Use Drugs or Alcohol? No data recorded What Did You Use and How Much? Alcohol a 5th and a half   Do You Currently Have a Therapist/Psychiatrist?  No  Name of Therapist/Psychiatrist: No data recorded  Have You Been Recently Discharged From Any Office Practice or Programs? No  Explanation of Discharge From Practice/Program: No data recorded    CCA Screening Triage Referral Assessment Type of Contact: Face-to-Face  Is this Initial or Reassessment? No data recorded Date  Telepsych consult ordered in [facility]:  No data recorded Time Telepsych consult ordered in [facility]L:  No data recorded  Patient Reported Information Reviewed? No data recorded Patient Left Without Being Seen? No data recorded Reason for Not Completing Assessment: Patient refused stating I am too fu#### up to do this   Collateral Involvement: No data recorded  Does Patient Have a Court Appointed Legal Guardian? No data recorded Name and Contact of Legal Guardian: No data recorded If Minor and Not Living with Parent(s), Who has Custody? No data recorded Is CPS involved or ever been involved? Never  Is APS involved or ever been involved? Never   Patient Determined To Be At Risk for Harm To Self or Others Based on Review of Patient Reported Information or Presenting Complaint? No  Method: Plan without intent  Availability of Means: No data recorded Intent: No data recorded Notification Required: No data recorded Additional Information for Danger to Others Potential: No data recorded Additional Comments for Danger to Others Potential: No data recorded Are There Guns or Other Weapons in Your Home? No  Types of Guns/Weapons: No data recorded Are These Weapons Safely Secured?                            No data recorded Who Could Verify You Are Able To Have These Secured: No data recorded Do You Have any Outstanding Charges, Pending Court Dates, Parole/Probation? No data recorded Contacted To Inform of Risk of Harm To Self or Others: No data recorded  Location of Assessment: [facility] ED   Does Patient Present under Involuntary Commitment? Yes  IVC Papers Initial File Date: No data recorded  [place] of Residence: [place]   Patient Currently Receiving the Following Services: Not Receiving Services   Determination of Need: Emergent (2 hours)   Options For Referral: Inpatient Hospitalization     CCA Biopsychosocial Intake/Chief Complaint:  No data recorded Current Symptoms/Problems:  No data recorded  Patient Reported Schizophrenia/Schizoaffective Diagnosis in Past: No   Strengths: Patient is able to communicate his needs  Preferences: No data recorded Abilities: No data recorded  Type of Services Patient Feels are Needed: No data recorded  Initial Clinical Notes/Concerns: No data recorded  Mental Health Symptoms Depression:  Hopelessness; Change in energy/activity; Difficulty Concentrating; Fatigue; Irritability   Duration of Depressive symptoms: Greater than two weeks   Mania:  None   Anxiety:   Fatigue; Irritability; Restlessness; Worrying   Psychosis:  None   Duration of Psychotic symptoms: No data recorded  Trauma:  None   Obsessions:  Poor insight   Compulsions:  Poor Insight; Repeated behaviors/mental acts   Inattention:  None   Hyperactivity/Impulsivity:  None   Oppositional/Defiant Behaviors:  Angry; Argumentative; Easily annoyed; Temper   Emotional Irregularity:  Intense/inappropriate anger; Intense/unstable relationships; Mood lability; Potentially harmful impulsivity; Recurrent suicidal behaviors/gestures/threats; Chronic feelings of emptiness   Other Mood/Personality Symptoms:  No data recorded   Mental Status Exam Appearance and self-care  Stature:  Average   Weight:  Overweight   Clothing:  Disheveled   Grooming:  Neglected   Cosmetic use:  None   Posture/gait:  Tense   Motor  activity:  Agitated   Sensorium  Attention:  Normal   Concentration:  Normal   Orientation:  Person; Place; Time   Recall/memory:  Defective in Recent   Affect and Mood  Affect:  Flat   Mood:  Hopeless; Irritable   Relating  Eye contact:  None   Facial expression:  Responsive   Attitude toward examiner:  Irritable   Thought and Language  Speech flow: Clear and Coherent   Thought content:  Appropriate to Mood and Circumstances   Preoccupation:  None   Hallucinations:  None   Organization:  No data recorded  [place] of Knowledge:  Average   Intelligence:  Average   Abstraction:  Normal   Judgement:  Fair   Reality Testing:  Adequate   Insight:  Fair   Decision Making:  Impulsive   Social Functioning  Social Maturity:  Impulsive   Social Judgement:  Heedless   Stress  Stressors:  Family conflict; Legal; Housing; Financial   Coping Ability:  Deficient supports   Skill Deficits:  None   Supports:  Support needed     Religion: Religion/Spirituality Are You A Religious Person?: No  Leisure/Recreation: Leisure / Recreation Do You Have Hobbies?: No  Exercise/Diet: Exercise/Diet Do You Exercise?: No Have You Gained or Lost A Significant Amount of Weight in the Past Six Months?: No Do You Follow a Special Diet?: No Do You Have Any Trouble Sleeping?: Yes   CCA Employment/Education Employment/Work Situation: Employment / Work Situation Employment Situation: Unemployed Patient's Job has Been Impacted by Current Illness: No Has Patient ever Been in Equities Trader?: No  Education: Education Is Patient Currently Attending School?: No Last Grade Completed: 12 Did You Have An Individualized Education Program (IIEP): No Did You Have Any Difficulty At [employer]?: No Patient's Education Has Been Impacted by Current Illness: No   CCA Family/Childhood History Family and Relationship History: Family history Marital status: Single Does patient have children?: Yes  Childhood History:  Childhood History By whom was/is the patient raised?: Mother Did patient suffer any verbal/emotional/physical/sexual abuse as a child?: No Did patient suffer from severe childhood neglect?: No Has patient ever been sexually abused/assaulted/raped as an adolescent or adult?: No Was the patient ever a victim of a crime or a disaster?: No Witnessed domestic violence?: No Has patient been affected by domestic violence as an adult?: No  Child/Adolescent Assessment:     CCA Substance  Use Alcohol/Drug Use: Alcohol / Drug Use Pain Medications: see mar Prescriptions: see mar Over the Counter: see mar History of alcohol / drug use?: Yes Negative Consequences of Use: Financial, Legal, Personal relationships Withdrawal Symptoms: Agitation, Aggressive/Assaultive, Blackouts Substance #1 Name of Substance 1: Alcohol 1 - Age of First Use: unknown 1 - Amount (size/oz): I was up to a 5th and a half today 1 - Frequency: daily 1 - Duration: unknown 1 - Last Use / Amount: 07/22/24 1 - Method of Aquiring: purchasing 1- Route of Use: oral                       ASAM's:  Six Dimensions of Multidimensional Assessment  Dimension 1:  Acute Intoxication and/or Withdrawal Potential:      Dimension 2:  Biomedical Conditions and Complications:      Dimension 3:  Emotional, Behavioral, or Cognitive Conditions and Complications:     Dimension 4:  Readiness to Change:     Dimension 5:  Relapse, Continued use, or Continued Problem Potential:  Dimension 6:  Recovery/Living Environment:     ASAM Severity Score:    ASAM Recommended Level of Treatment: ASAM Recommended Level of Treatment: Level III Residential Treatment   Substance use Disorder (SUD) Substance Use Disorder (SUD)  Checklist Symptoms of Substance Use: Continued use despite having a persistent/recurrent physical/psychological problem caused/exacerbated by use, Continued use despite persistent or recurrent social, interpersonal problems, caused or exacerbated by use, Large amounts of time spent to obtain, use or recover from the substance(s), Persistent desire or unsuccessful efforts to cut down or control use, Evidence of tolerance, Evidence of withdrawal (Comment), Presence of craving or strong urge to use, Recurrent use that results in a failure to fulfill major role obligations (work, school, home), Social, occupational, recreational activities given up or reduced due to use, Substance(s) often taken in larger  amounts or over longer times than was intended  Recommendations for Services/Supports/Treatments: Recommendations for Services/Supports/Treatments Recommendations For Services/Supports/Treatments: Inpatient Hospitalization  DSM5 Diagnoses: Patient Active Problem List   Diagnosis Date Noted   Bipolar affective disorder, currently depressed, moderate (HCC) 05/07/2024   Tobacco abuse 05/03/2024   Rectal bleeding 05/02/2024   Essential hypertension 05/02/2024   Obesity (BMI 30-39.9) 05/02/2024   Acute right ankle pain 05/02/2024   Blood in stool 05/01/2024   Diverticulosis 05/01/2024   Mood disorder 04/30/2024   Chest pain 04/10/2024   Major depressive disorder, 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/10/2024   Substance induced mood disorder (HCC) 04/09/2024   Acute alcoholic intoxication in alcoholism without complication (HCC) 04/09/2024   Alcohol dependence with alcohol-induced mood disorder (HCC) 04/09/2024   Homicidal ideation 04/09/2024   Suicidal ideation 04/09/2024   Alcohol dependence with uncomplicated withdrawal (HCC) 04/09/2024   Diverticulitis of large intestine with abscess without bleeding 03/31/2024   Alcoholic hepatitis (HCC) 03/14/2024   Diverticulitis of colon with perforation 03/13/2024   Alcoholism (HCC) 03/13/2024    Patient Centered Plan: Patient is on the following Treatment Plan(s):  Depression and Substance Abuse   Referrals to Alternative Service(s): Referred to Alternative Service(s):   Place:   Date:   Time:    Referred to Alternative Service(s):   Place:   Date:   Time:    Referred to Alternative Service(s):   Place:   Date:   Time:    Referred to Alternative Service(s):   Place:   Date:   Time:      @[email or web address] @  [employer], LCAS-A

## 2024-07-23 NOTE — Consult Note (Signed)
 [facility] Psychiatric Consult Initial  Patient Name: .Christian Dickerson  MRN: [id number]  DOB: Sep 14, 1985  Consult Order details:  Orders (From admission, onward)     Start     Ordered   07/22/24 1723  IP CONSULT TO PSYCHIATRY       Ordering Provider: Willo Dunnings, MD  Provider:  (Not yet assigned)  Question:  Reason for consult:  Answer:  Medication management   07/22/24 1722   07/22/24 1723  CONSULT TO CALL ACT TEAM       Ordering Provider: Willo Dunnings, MD  Provider:  (Not yet assigned)  Question:  Reason for Consult?  Answer:  SI   07/22/24 1722             Mode of Visit: Tele-visit Virtual Statement:TELE PSYCHIATRY ATTESTATION & CONSENT As the provider for this telehealth consult, I attest that I verified the patient's identity using two separate identifiers, introduced myself to the patient, provided my credentials, disclosed my location, and performed this encounter via a HIPAA-compliant, real-time, face-to-face, two-way, interactive audio and video platform and with the full consent and agreement of the patient (or guardian as applicable.) Patient physical location: [facility]. Telehealth provider physical location: home office in state of [place] .   Video start time:   Video end time:      Psychiatry Consult Evaluation  Service Date: July 23, 2024 LOS:  LOS: 0 days  Chief Complaint SI, HI  Primary Psychiatric Diagnoses  Bipolar 2.  Depression 3.  SI attempt  Assessment  Christian Dickerson is a 39 y.o. male admitted: Presented to the EDfor 07/22/2024 12:58 PM for suicidal and homicidal ideation with recent suicide attempt and acute behavioral disturbance. He carries the psychiatric diagnoses of bipolar depressive disorder and substance use disorders (alcohol and cannabis) and has a past medical history of depression (no other medical conditions reported).  His current presentation of persistent suicidal and homicidal ideation, recent  intentional ingestion of toxic substances, mood lability, intoxication, impaired judgment, and threatening behavior toward others is most consistent with a severe major depressive episode with high suicide and violence risk in the context of substance use and psychosocial stressors. He meets criteria for inpatient psychiatric admission based on active suicidal ideation with recent high-lethality attempt (ingestion of bleach and rat poison), ongoing homicidal ideation, history of prior suicide attempt, substance intoxication, homelessness, medication nonadherence, lack of outpatient supports, impaired insight and judgment, and inability to ensure safety.  Current outpatient psychotropic medications include Prozac  (fluoxetine ), which the patient reports he had been taking for several months but ran out of a few weeks ago. Historically, he reports a positive response, stating the medication made him feel happier. He was noncompliant with medications prior to admission as evidenced by running out of Prozac  and not reestablishing care after relocating.  On initial examination, patient was intoxicated, agitated, and intermittently hostile, requiring placement in a restraint chair for patient and staff safety. He endorsed active suicidal ideation and homicidal ideation, stating he believes killing himself is preferable to harming others. Mood was labile, fluctuating between calm and hostile. Thought process was goal-directed but dominated by self-harm and harm-to-others themes. He denied auditory or visual hallucinations and paranoia. Insight and judgment were severely impaired. The patient reported daily use of marijuana and vodka, homelessness, unemployment, and unresolved legal issues. Given the above, the patient is recommended for inpatient psychiatric admission for safety, stabilization, substance withdrawal monitoring, and medication management.   Diagnoses:  Active Hospital problems: Active  Problems:   *  No active hospital problems. *    Plan   ## Psychiatric Medication Recommendations:  ARIPiprazole  (ABILIFY ) 5 MG tablet, Take 1 tablet (5 mg total) by mouth at bedtime. FLUoxetine  (PROZAC ) 20 MG capsule, Take 20 mg by mouth daily.  gabapentin  (NEURONTIN ) 300 MG capsule, Take 1 capsule (300 mg total) by mouth 3 (three) times daily  ## Medical Decision Making Capacity: Not specifically addressed in this encounter  ## Disposition:-- We recommend inpatient admission.The patient is currently under IVC.  ## Behavioral / Environmental: - No specific recommendations at this time.     ## Safety and Observation Level:  - Based on my clinical evaluation, I estimate the patient to be at high risk of self harm in the current setting. - At this time, we recommend  routine. This decision is based on my review of the chart including patient's history and current presentation, interview of the patient, mental status examination, and consideration of suicide risk including evaluating suicidal ideation, plan, intent, suicidal or self-harm behaviors, risk factors, and protective factors. This judgment is based on our ability to directly address suicide risk, implement suicide prevention strategies, and develop a safety plan while the patient is in the clinical setting. Please contact our team if there is a concern that risk level has changed.  CSSR Risk Category:C-SSRS RISK CATEGORY: High Risk  Suicide Risk Assessment: Patient has following modifiable risk factors for suicide: active suicidal ideation, untreated depression, and medication noncompliance, which we are addressing by recommending inpatient admission. Patient has following non-modifiable or demographic risk factors for suicide: male gender Patient has the following protective factors against suicide: Frustration tolerance  Thank you for this consult request. Recommendations have been communicated to the primary team.  We will recommend inpatient  admission at this time.   Srihari Shellhammer, NP       History of Present Illness  Relevant Aspects of Hospital ED Course:  Admitted on 07/22/2024 for suicidal and homicidal ideation.   Patient Report: A 39 year old male brought [place] the ED by EMS [place] after a bystander reported bizarre and threatening behavior. The patient endorses ongoing suicidal and homicidal ideation. He reports that yesterday he intentionally ingested an entire bottle of bleach and rat poison in an attempt to end his life. When asked about his intent, the patient stated that he believes it is better for him to kill himself than to hurt other people. He reports that he has felt suicidal for most of his life and states that more recently he has begun to experience thoughts of wanting to hurt others.  The patient reports a prior suicide attempt in October involving an overdose of pills. He states he is currently still experiencing suicidal and homicidal thoughts. He denies auditory or visual hallucinations and denies paranoia. He reports one prior inpatient psychiatric admission and denies current outpatient counseling or therapeutic services.  The patient reports he had been taking Prozac  for the past few months and states it was helpful, making him feel happier; however, he reports running out of the medication a few weeks ago after moving away from the area where he previously received care. He reports he moved due to significant personal stressors. He reports he is currently homeless, unemployed, and has legal issues related to a prior DWI with an upcoming court date he cannot recall.  The patient endorses daily substance use, stating he uses as much marijuana as possible each day and drinks vodka daily. He  reports mood fluctuations ranging from calm to hostile. He presents seeking help but continues to express active suicidal and homicidal ideation.  Psych ROS:  Depression: yes Anxiety:  no Mania (lifetime  and current): no Psychosis: (lifetime and current): no  Review of Systems  Constitutional: Negative.   HENT: Negative.    Eyes: Negative.   Respiratory: Negative.    Cardiovascular: Negative.   Gastrointestinal: Negative.   Genitourinary: Negative.   Musculoskeletal: Negative.   Skin: Negative.   Neurological: Negative.   Endo/Heme/Allergies: Negative.   Psychiatric/Behavioral:  Positive for depression, substance abuse and suicidal ideas.      Psychiatric and Social History  Psychiatric History:  Information collected from patient and chart history  Prev Dx/Sx: Bipolar Depression Current Psych Provider: none reported Home Meds (current): Prozac  (non compliant) Previous Med Trials: none reported Therapy: denies  Prior Psych Hospitalization: yes  Prior Self Harm: no Prior Violence: no  Family Psych History: none reported Family Hx suicide: none reported  Social History:  Developmental Hx: unknown Educational Hx: unknown Occupational Hx: unknown Legal Hx: yes Living Situation: homeless Spiritual Hx: unknown Access to weapons/lethal means: no   Substance History Alcohol: yes  Type of alcohol liquor Last Drink today Number of drinks per day as much as possible History of alcohol withdrawal seizures denies History of DT's denies Tobacco: denies Illicit drugs: marijuana Prescription drug abuse: denies Rehab hx: none reported  Exam Findings   Vital Signs:  Temp:  [97.7 F (36.5 C)-98.4 F (36.9 C)] 98.4 F (36.9 C) (01/16 1846) Pulse Rate:  [73-88] 73 (01/16 2130) Resp:  [16-18] 16 (01/16 2130) BP: (114-127)/(76-88) 124/88 (01/16 2130) SpO2:  [95 %-98 %] 98 % (01/16 2130) Blood pressure 124/88, pulse 73, temperature 98.4 F (36.9 C), resp. rate 16, SpO2 98%. There is no height or weight on file to calculate BMI.  Physical Exam HENT:     Head: Normocephalic.     Nose: Nose normal.     Mouth/Throat:     Mouth: Mucous membranes are dry.  Eyes:      Extraocular Movements: Extraocular movements intact.  Pulmonary:     Effort: Pulmonary effort is normal.  Musculoskeletal:        General: Normal range of motion.     Cervical back: Normal range of motion.  Skin:    General: Skin is dry.  Neurological:     Mental Status: He is alert.        Other History   These have been pulled in through the EMR, reviewed, and updated if appropriate.  Family History:  The patient's family history includes Heart attack in his father and mother.  Medical History: Past Medical History:  Diagnosis Date   Alcohol abuse    Diverticulitis    Hypertension    Substance abuse (HCC)     Surgical History: Past Surgical History:  Procedure Laterality Date   ANKLE SURGERY     CHEST TUBE INSERTION     LITHOTRIPSY     TIBIA FRACTURE SURGERY     TONSILLECTOMY AND ADENOIDECTOMY       Medications:  Current Medications[1]  Allergies: Allergies[2]  Saraih Lorton, NP      [1] No current facility-administered medications for this encounter.  Current Outpatient Medications:    acetaminophen  (TYLENOL ) 325 MG tablet, Take 2 tablets (650 mg total) by mouth every 8 (eight) hours as needed for headache, fever or mild pain (pain score 1-3)., Disp: , Rfl:    amLODipine  (NORVASC )  5 MG tablet, Take 1 tablet (5 mg total) by mouth daily., Disp: 30 tablet, Rfl: 0   ARIPiprazole  (ABILIFY ) 5 MG tablet, Take 1 tablet (5 mg total) by mouth at bedtime., Disp: 30 tablet, Rfl: 0   cyanocobalamin  1000 MCG tablet, Take 1 tablet (1,000 mcg total) by mouth daily., Disp: 30 tablet, Rfl: 2   FLUoxetine  (PROZAC ) 10 MG capsule, Take 10 mg by mouth daily., Disp: , Rfl:    gabapentin  (NEURONTIN ) 300 MG capsule, Take 1 capsule (300 mg total) by mouth 3 (three) times daily., Disp: 90 capsule, Rfl: 0   pantoprazole  (PROTONIX ) 40 MG tablet, Take 1 tablet (40 mg total) by mouth daily., Disp: 30 tablet, Rfl: 0   tamsulosin  (FLOMAX ) 0.4 MG CAPS capsule,  Take 1 capsule (0.4 mg total) by mouth daily., Disp: 30 capsule, Rfl: 0   thiamine  (VITAMIN B-1) 100 MG tablet, Take 1 tablet (100 mg total) by mouth daily., Disp: 30 tablet, Rfl: 0   Vitamin D , Ergocalciferol , (DRISDOL ) 1.25 MG (50000 UNIT) CAPS capsule, Take 1 capsule (50,000 Units total) by mouth every 7 (seven) days., Disp: 12 capsule, Rfl: 0   chlordiazePOXIDE  (LIBRIUM ) 25 MG capsule, Take 1 capsule (25 mg total) by mouth daily. (Patient not taking: Reported on 05/25/2024), Disp: 30 capsule, Rfl: 0   FLUoxetine  (PROZAC ) 20 MG capsule, Take 1 capsule (20 mg total) by mouth daily. (Patient not taking: Reported on 07/22/2024), Disp: 30 capsule, Rfl: 0 [2] Allergies Allergen Reactions   Latex

## 2024-07-23 NOTE — ED Notes (Signed)
 EMTALA Reviewed by this RN.

## 2024-07-23 NOTE — BH Assessment (Signed)
 Per [facility]), patient to be referred out of system.  Referral information for Psychiatric Hospitalization faxed to;   [facility]  [phone]  [facility] Patient Placement  [phone]  [facility]  [phone]  [facility]  [phone]  [facility]  [phone]  [facility]  [phone]  [facility]  [phone]  [facility]  [phone]  [facility]  [phone]  [facility]  [phone]  [facility]  [phone]  [facility]  [phone]  [facility]  [phone]  [facility]  [phone]  [facility]  [phone]

## 2024-07-23 NOTE — ED Notes (Signed)
 Pt awake and cooperative. He was given 2 cups of water and made a restroom visit.

## 2024-07-23 NOTE — ED Notes (Signed)
 IVC/pending admission to [facility] after 8AM
